# Patient Record
Sex: Female | Born: 1945 | Race: White | Hispanic: No | Smoking: Current every day smoker
Health system: Southern US, Community
[De-identification: ages and names within clinical notes are randomized; demographics above are authoritative.]

## PROBLEM LIST (undated history)

## (undated) DIAGNOSIS — K52831 Collagenous colitis: Secondary | ICD-10-CM

## (undated) DIAGNOSIS — I6529 Occlusion and stenosis of unspecified carotid artery: Secondary | ICD-10-CM

## (undated) DIAGNOSIS — I10 Essential (primary) hypertension: Secondary | ICD-10-CM

## (undated) HISTORY — PX: APPENDECTOMY: SHX54

## (undated) HISTORY — DX: Essential (primary) hypertension: I10

## (undated) HISTORY — DX: Collagenous colitis: K52.831

## (undated) HISTORY — DX: Occlusion and stenosis of unspecified carotid artery: I65.29

---

## 2008-05-12 ENCOUNTER — Emergency Department (HOSPITAL_COMMUNITY): Admission: EM | Admit: 2008-05-12 | Discharge: 2008-05-12 | Payer: Self-pay | Admitting: Emergency Medicine

## 2008-05-12 IMAGING — CR DG RIBS W/ CHEST 3+V*L*
3 series · 3 of 3 positions shown · non-contrast
Comparison: No priors

CLINICAL DATA: Fell - left anterior rib pain

LEFT RIBS AND CHEST - 3+ VIEW

[view not recorded (1 of 3)]
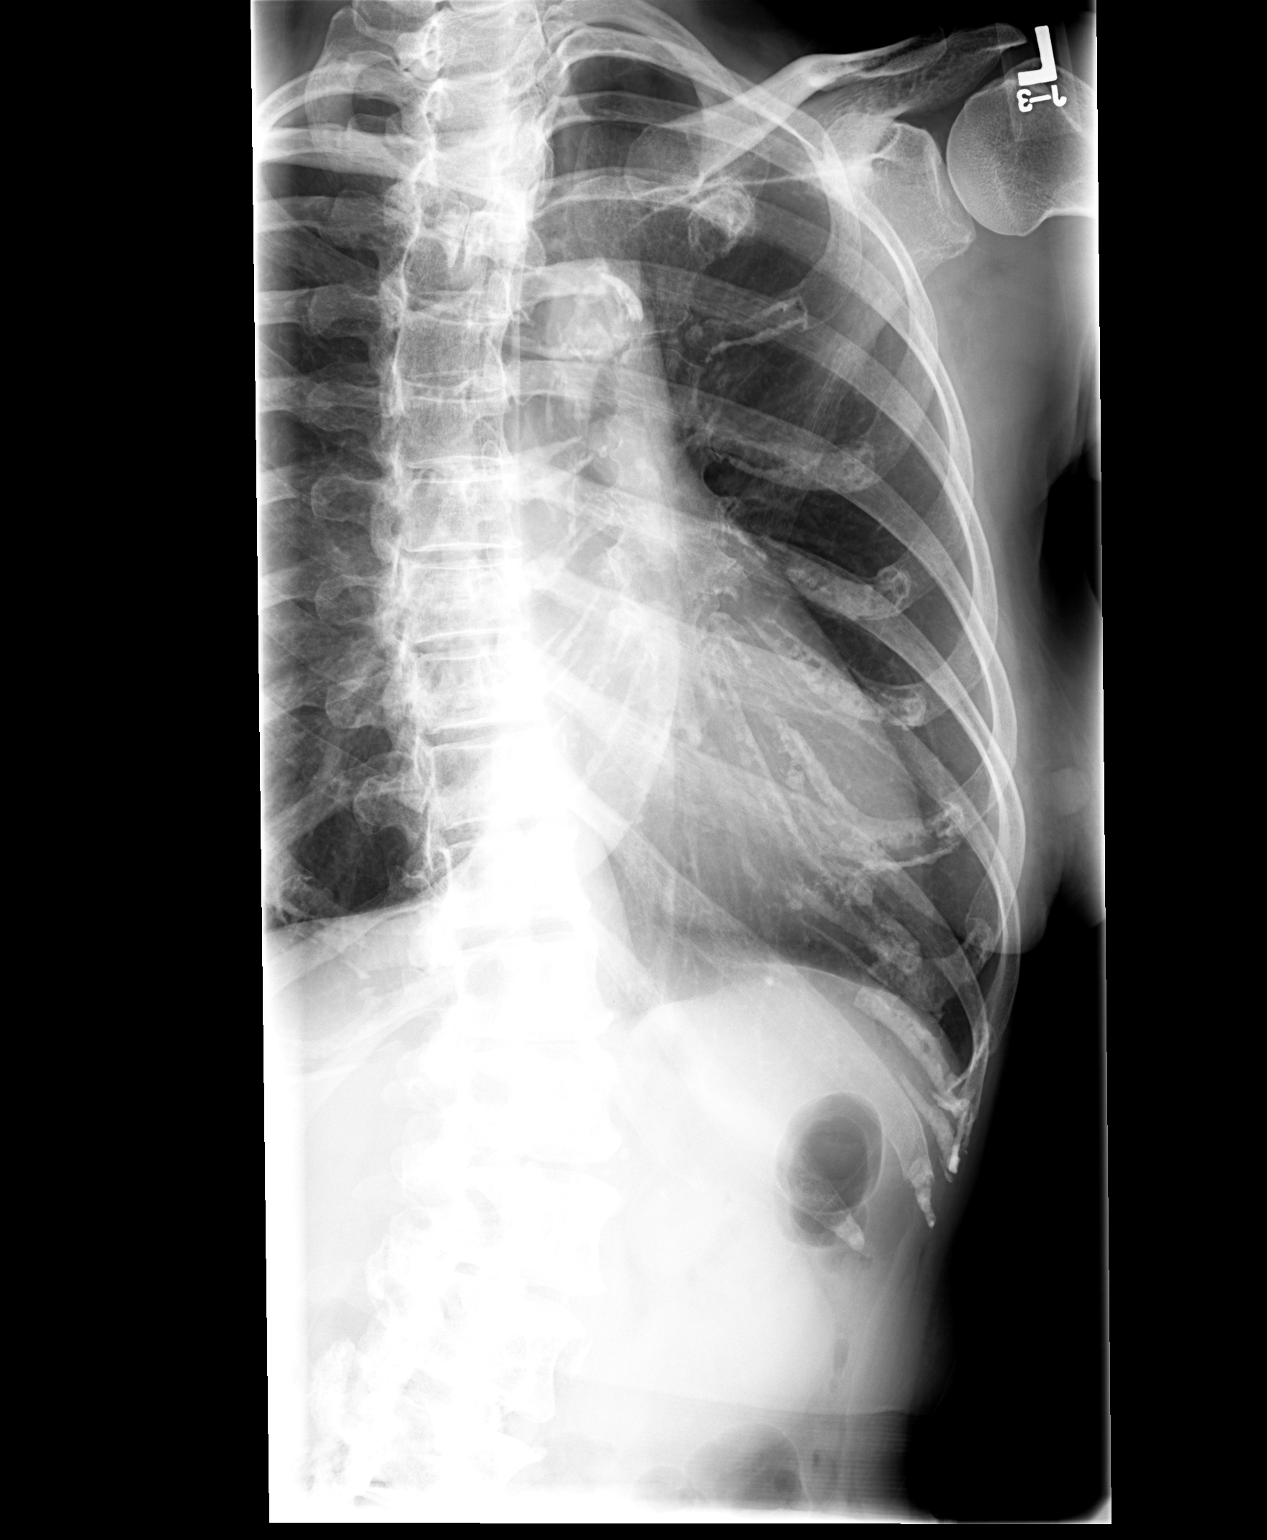

[view not recorded (2 of 3)]
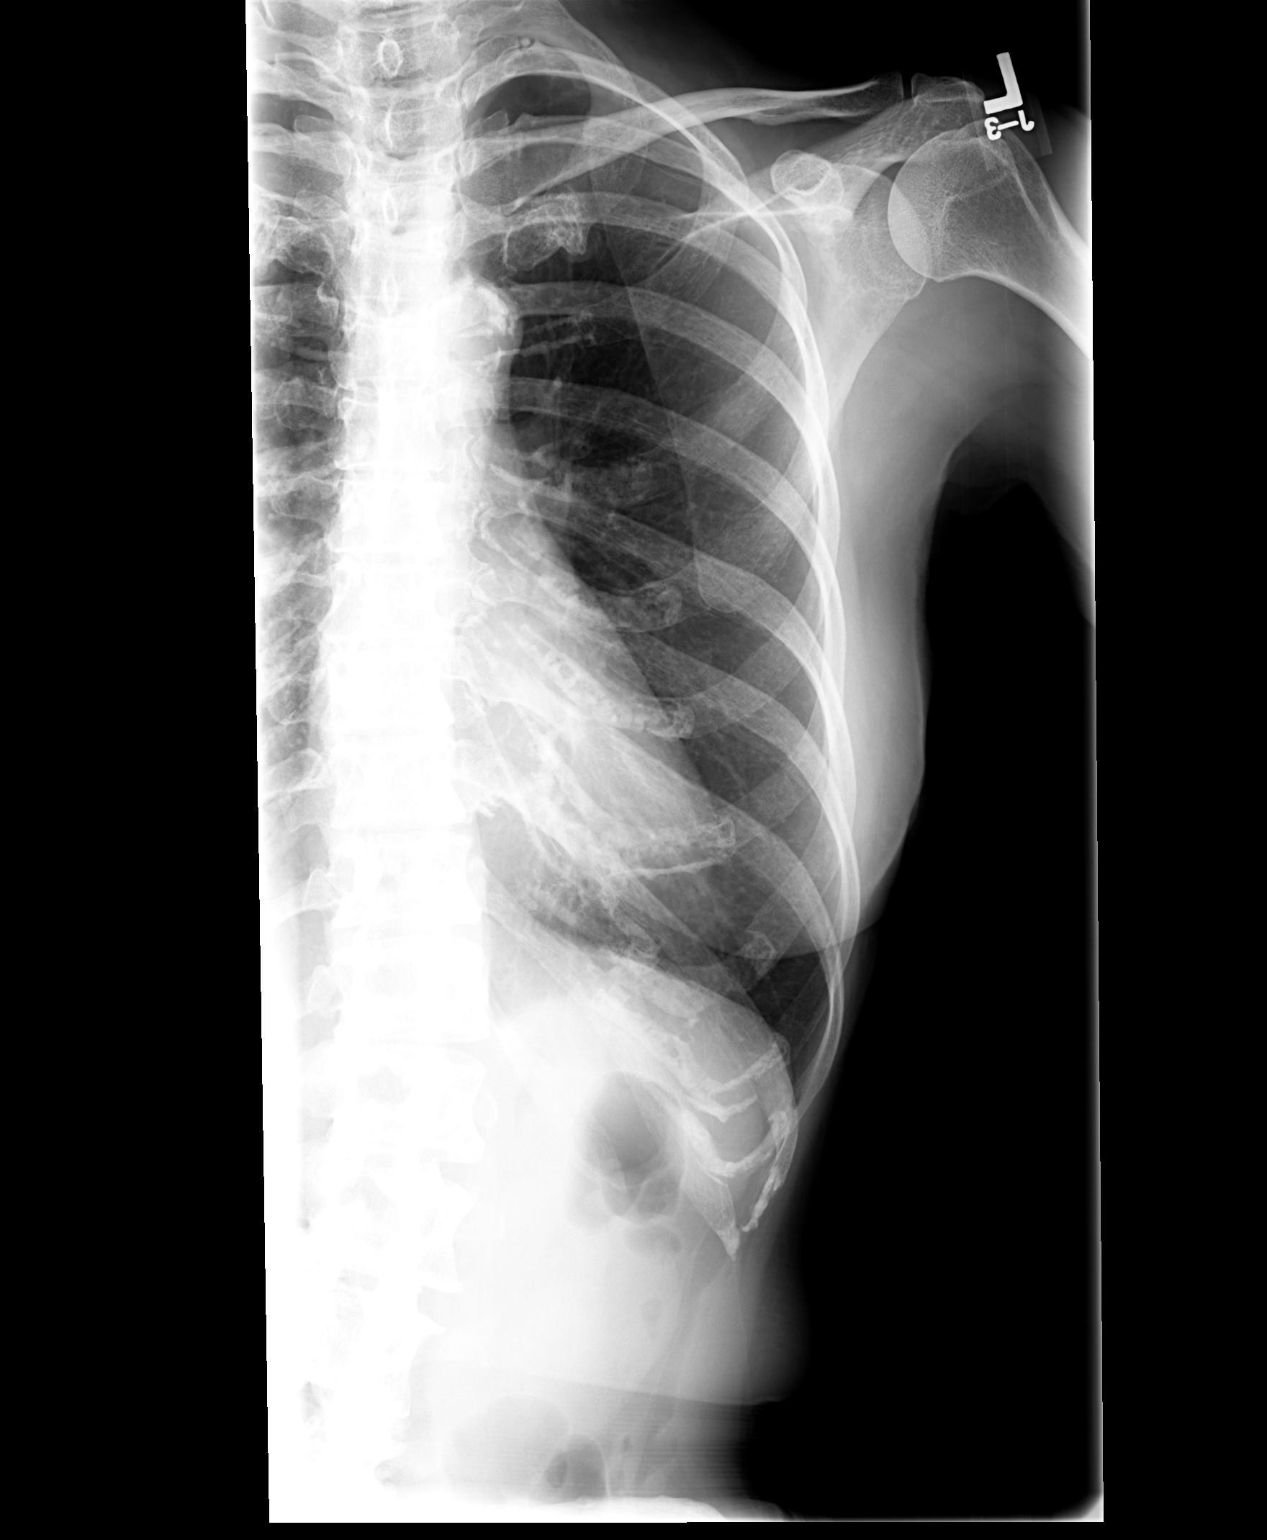

[view not recorded (3 of 3)]
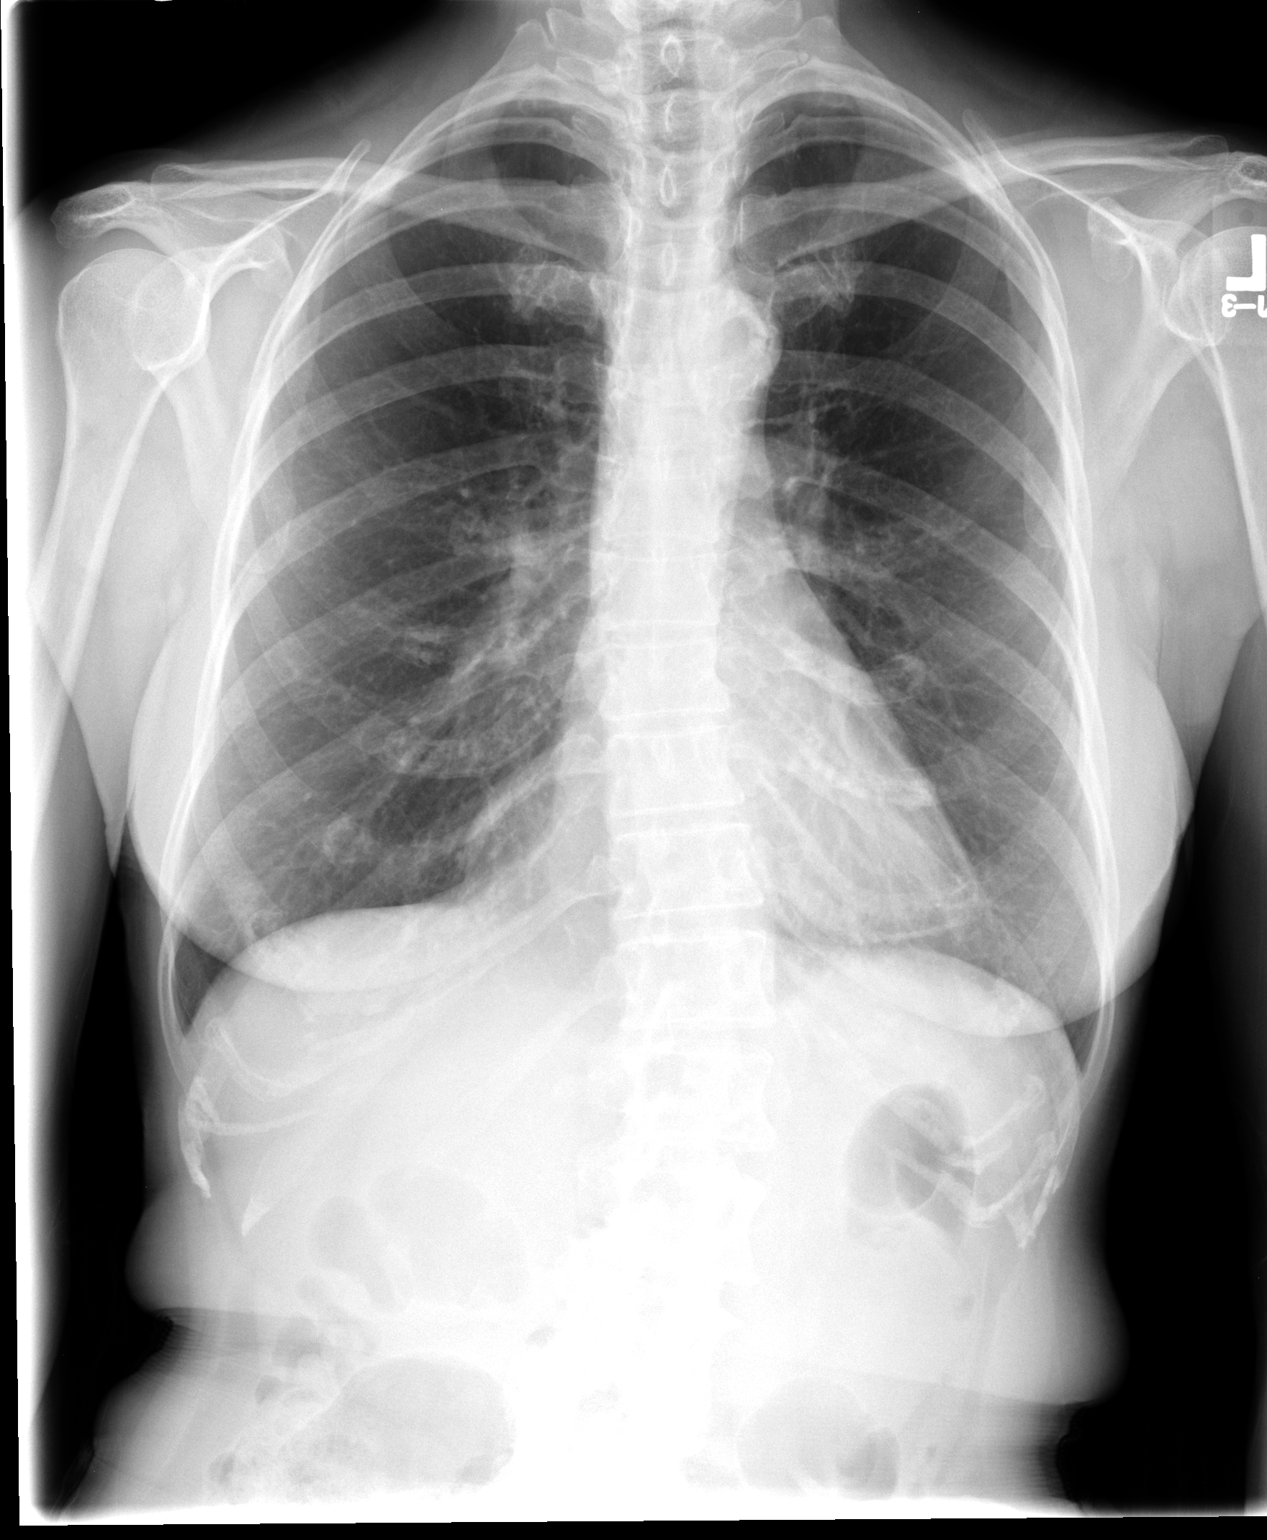

[3 of 3 positions shown; findings below may reference images not displayed]

FINDINGS: Heart and mediastinal contours normal.  Lungs clear.  No
rib fractures, pneumothorax, or hemothorax.  There is a moderate
biconcave thoracolumbar scoliosis.
IMPRESSION: 1.  No rib fractures or other acute changes.
2.  No active cardiopulmonary disease.

## 2008-06-09 ENCOUNTER — Encounter: Admission: RE | Admit: 2008-06-09 | Discharge: 2008-06-09 | Payer: Self-pay | Admitting: Internal Medicine

## 2009-07-01 ENCOUNTER — Encounter: Admission: RE | Admit: 2009-07-01 | Discharge: 2009-07-01 | Payer: Self-pay | Admitting: Internal Medicine

## 2010-07-19 ENCOUNTER — Other Ambulatory Visit: Payer: Self-pay | Admitting: Internal Medicine

## 2010-07-19 DIAGNOSIS — Z1231 Encounter for screening mammogram for malignant neoplasm of breast: Secondary | ICD-10-CM

## 2010-07-26 ENCOUNTER — Ambulatory Visit
Admission: RE | Admit: 2010-07-26 | Discharge: 2010-07-26 | Disposition: A | Discharge status: Home / Self Care | Payer: BC Managed Care – PPO | Source: Ambulatory Visit | Attending: Internal Medicine | Admitting: Internal Medicine

## 2010-07-26 DIAGNOSIS — Z1231 Encounter for screening mammogram for malignant neoplasm of breast: Secondary | ICD-10-CM

## 2011-06-30 DIAGNOSIS — M810 Age-related osteoporosis without current pathological fracture: Secondary | ICD-10-CM | POA: Diagnosis not present

## 2011-06-30 DIAGNOSIS — I10 Essential (primary) hypertension: Secondary | ICD-10-CM | POA: Diagnosis not present

## 2011-06-30 DIAGNOSIS — Z0000 Encounter for general adult medical examination without abnormal findings: Secondary | ICD-10-CM | POA: Diagnosis not present

## 2011-06-30 DIAGNOSIS — Z23 Encounter for immunization: Secondary | ICD-10-CM | POA: Diagnosis not present

## 2011-06-30 DIAGNOSIS — R5381 Other malaise: Secondary | ICD-10-CM | POA: Diagnosis not present

## 2011-06-30 DIAGNOSIS — R5383 Other fatigue: Secondary | ICD-10-CM | POA: Diagnosis not present

## 2011-06-30 DIAGNOSIS — Z79899 Other long term (current) drug therapy: Secondary | ICD-10-CM | POA: Diagnosis not present

## 2011-10-18 ENCOUNTER — Other Ambulatory Visit: Payer: Self-pay | Admitting: Internal Medicine

## 2011-10-18 DIAGNOSIS — Z1231 Encounter for screening mammogram for malignant neoplasm of breast: Secondary | ICD-10-CM

## 2011-11-01 ENCOUNTER — Ambulatory Visit
Admission: RE | Admit: 2011-11-01 | Discharge: 2011-11-01 | Disposition: A | Discharge status: Home / Self Care | Payer: Medicare Other | Source: Ambulatory Visit | Attending: Internal Medicine | Admitting: Internal Medicine

## 2011-11-01 DIAGNOSIS — Z1231 Encounter for screening mammogram for malignant neoplasm of breast: Secondary | ICD-10-CM

## 2012-01-03 DIAGNOSIS — R5383 Other fatigue: Secondary | ICD-10-CM | POA: Diagnosis not present

## 2012-01-03 DIAGNOSIS — R5381 Other malaise: Secondary | ICD-10-CM | POA: Diagnosis not present

## 2012-01-03 DIAGNOSIS — I10 Essential (primary) hypertension: Secondary | ICD-10-CM | POA: Diagnosis not present

## 2012-01-03 DIAGNOSIS — Z79899 Other long term (current) drug therapy: Secondary | ICD-10-CM | POA: Diagnosis not present

## 2012-04-21 DIAGNOSIS — Z23 Encounter for immunization: Secondary | ICD-10-CM | POA: Diagnosis not present

## 2012-07-09 DIAGNOSIS — R7309 Other abnormal glucose: Secondary | ICD-10-CM | POA: Diagnosis not present

## 2012-07-09 DIAGNOSIS — I10 Essential (primary) hypertension: Secondary | ICD-10-CM | POA: Diagnosis not present

## 2012-07-09 DIAGNOSIS — Z23 Encounter for immunization: Secondary | ICD-10-CM | POA: Diagnosis not present

## 2012-07-09 DIAGNOSIS — Z79899 Other long term (current) drug therapy: Secondary | ICD-10-CM | POA: Diagnosis not present

## 2012-07-09 DIAGNOSIS — M542 Cervicalgia: Secondary | ICD-10-CM | POA: Diagnosis not present

## 2012-07-09 DIAGNOSIS — E559 Vitamin D deficiency, unspecified: Secondary | ICD-10-CM | POA: Diagnosis not present

## 2012-07-09 DIAGNOSIS — Z1212 Encounter for screening for malignant neoplasm of rectum: Secondary | ICD-10-CM | POA: Diagnosis not present

## 2012-07-09 DIAGNOSIS — E785 Hyperlipidemia, unspecified: Secondary | ICD-10-CM | POA: Diagnosis not present

## 2012-07-09 DIAGNOSIS — Z0000 Encounter for general adult medical examination without abnormal findings: Secondary | ICD-10-CM | POA: Diagnosis not present

## 2013-01-01 ENCOUNTER — Other Ambulatory Visit: Payer: Self-pay

## 2013-01-01 DIAGNOSIS — Z1231 Encounter for screening mammogram for malignant neoplasm of breast: Secondary | ICD-10-CM

## 2013-01-07 DIAGNOSIS — M79609 Pain in unspecified limb: Secondary | ICD-10-CM | POA: Diagnosis not present

## 2013-01-07 DIAGNOSIS — R5383 Other fatigue: Secondary | ICD-10-CM | POA: Diagnosis not present

## 2013-01-07 DIAGNOSIS — M810 Age-related osteoporosis without current pathological fracture: Secondary | ICD-10-CM | POA: Diagnosis not present

## 2013-01-07 DIAGNOSIS — M25559 Pain in unspecified hip: Secondary | ICD-10-CM | POA: Diagnosis not present

## 2013-01-07 DIAGNOSIS — Z79899 Other long term (current) drug therapy: Secondary | ICD-10-CM | POA: Diagnosis not present

## 2013-01-07 DIAGNOSIS — R5381 Other malaise: Secondary | ICD-10-CM | POA: Diagnosis not present

## 2013-01-07 DIAGNOSIS — I10 Essential (primary) hypertension: Secondary | ICD-10-CM | POA: Diagnosis not present

## 2013-01-10 ENCOUNTER — Ambulatory Visit
Admission: RE | Admit: 2013-01-10 | Discharge: 2013-01-10 | Disposition: A | Discharge status: Home / Self Care | Payer: Medicare Other | Source: Ambulatory Visit | Attending: Internal Medicine | Admitting: Internal Medicine

## 2013-01-10 ENCOUNTER — Other Ambulatory Visit: Payer: Self-pay | Admitting: Internal Medicine

## 2013-01-10 DIAGNOSIS — M25552 Pain in left hip: Secondary | ICD-10-CM

## 2013-01-10 DIAGNOSIS — M169 Osteoarthritis of hip, unspecified: Secondary | ICD-10-CM | POA: Diagnosis not present

## 2013-01-10 DIAGNOSIS — M25551 Pain in right hip: Secondary | ICD-10-CM

## 2013-01-10 IMAGING — CR DG HIP W/ PELVIS BILAT
5 series · 5 of 5 positions shown · non-contrast
Comparison: None.

CLINICAL DATA: Bilateral hip pain, right greater than left, no
trauma

BILATERAL HIP WITH PELVIS - 4+ VIEW

[view not recorded (1 of 5)]
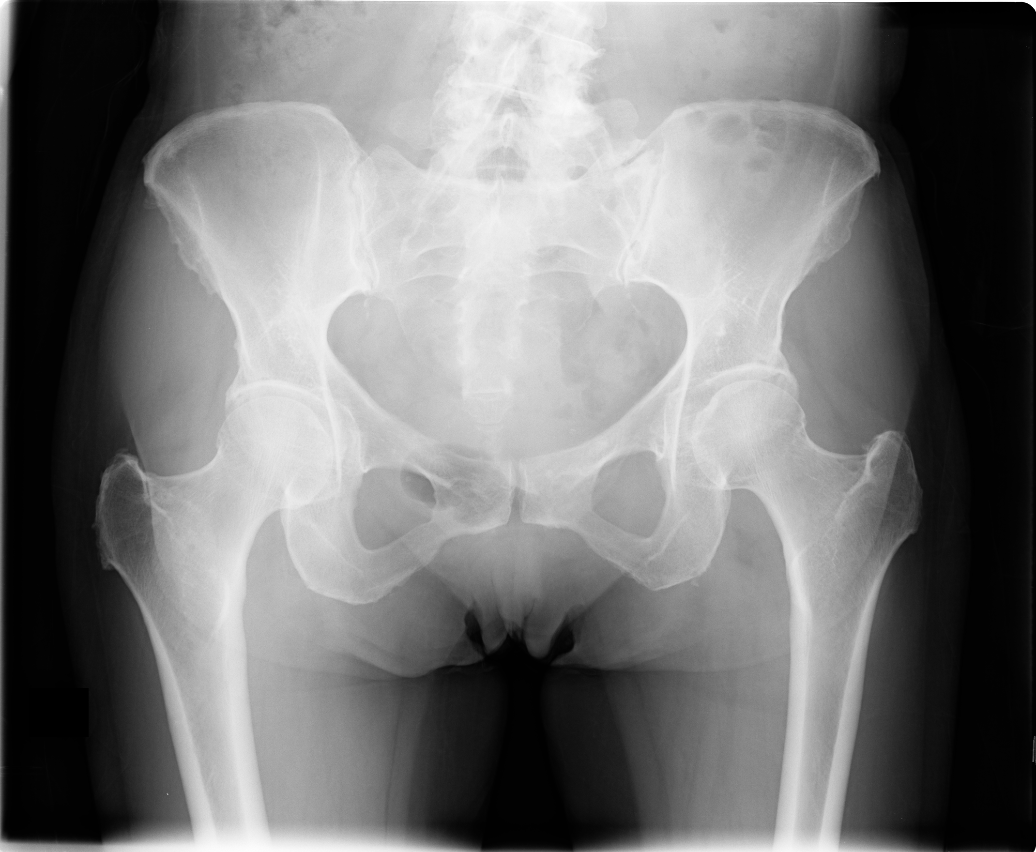

[view not recorded (2 of 5)]
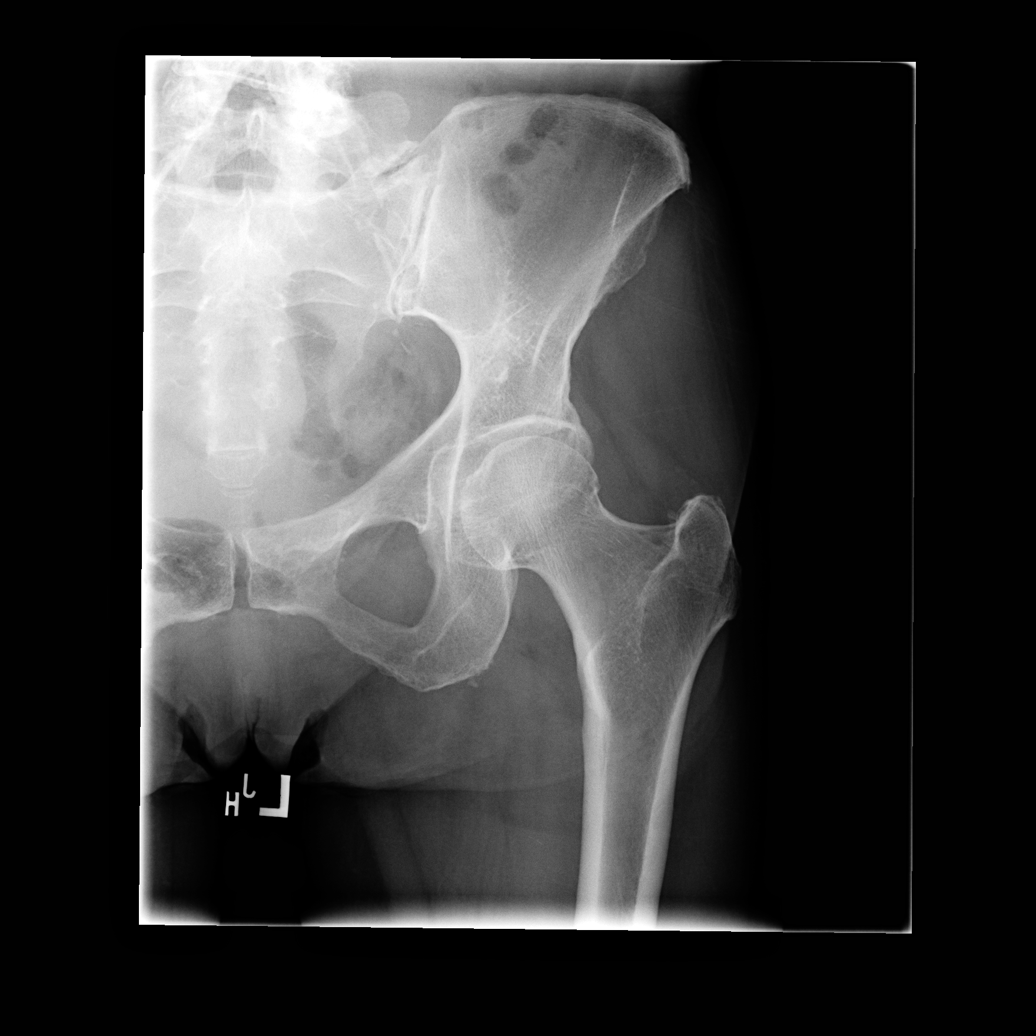

[view not recorded (3 of 5)]
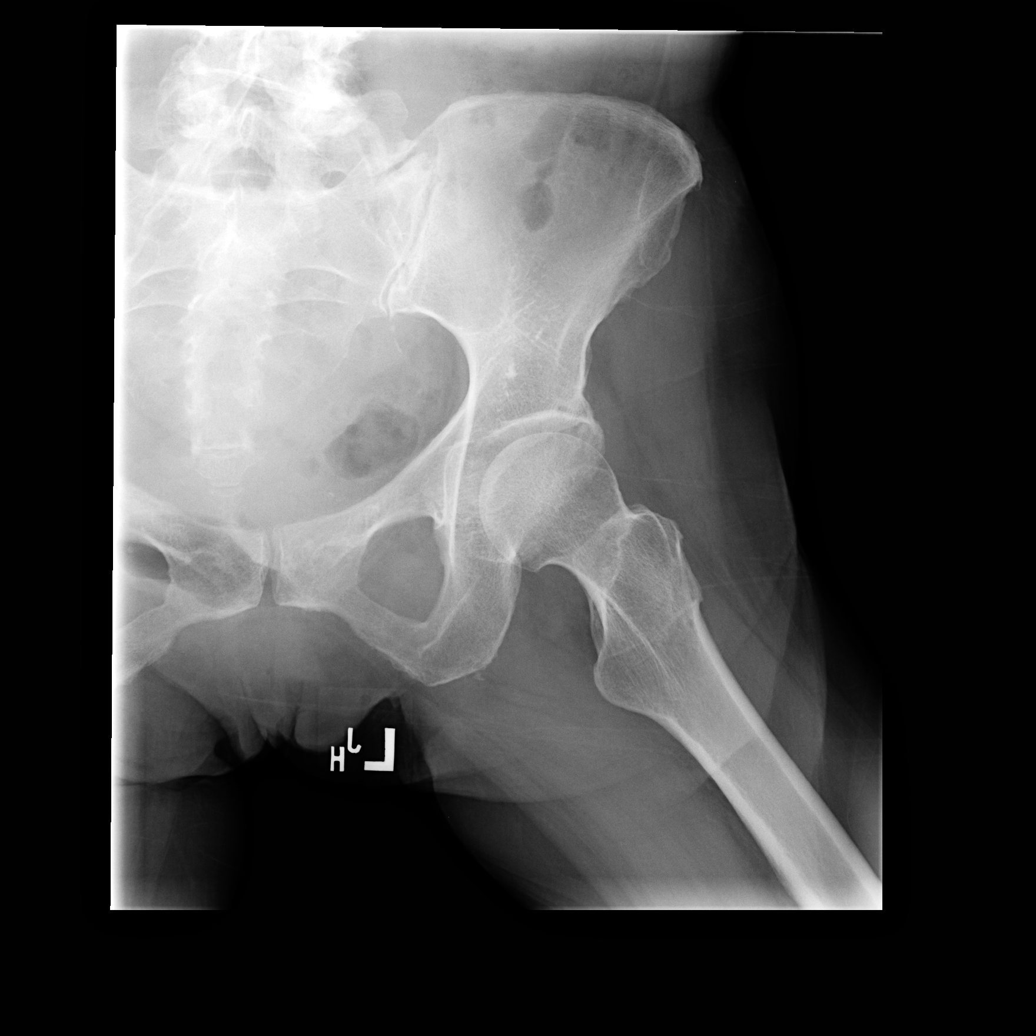

[view not recorded (4 of 5)]
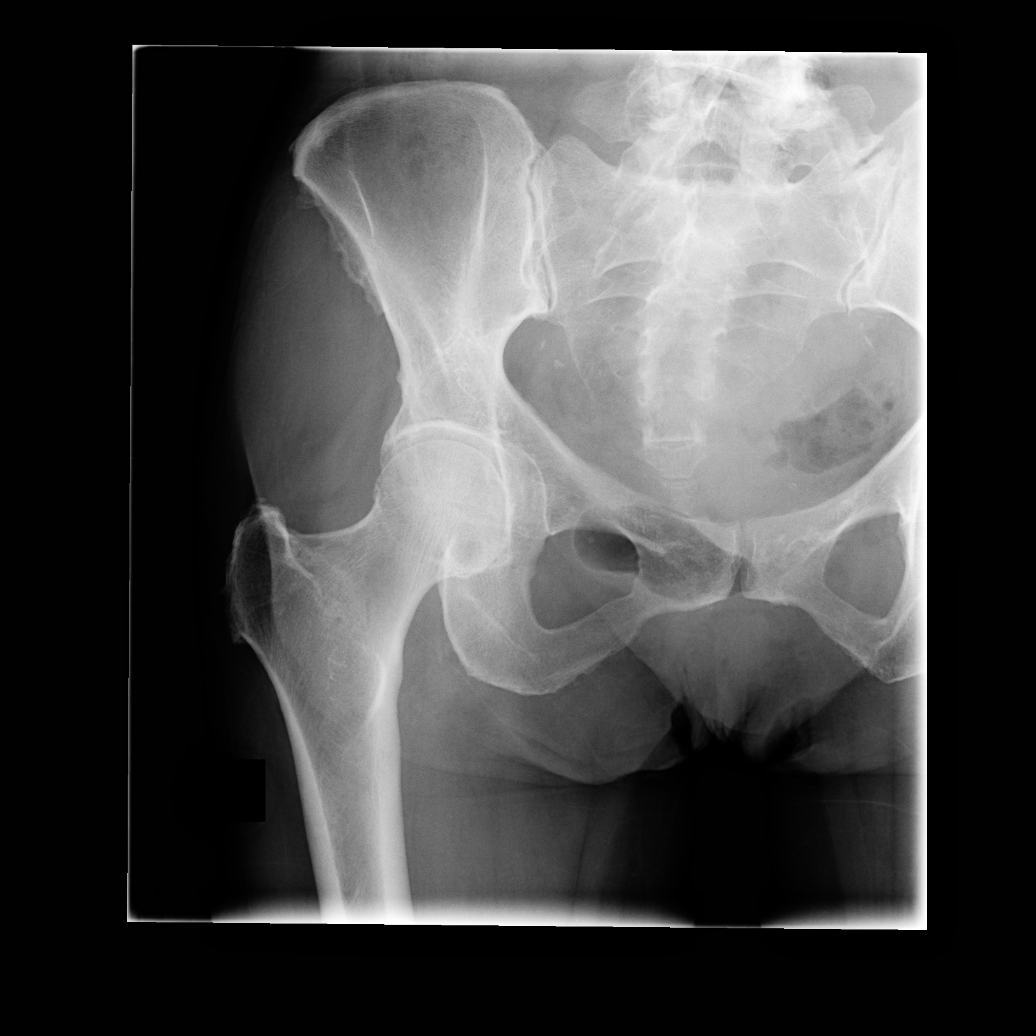

[view not recorded (5 of 5)]
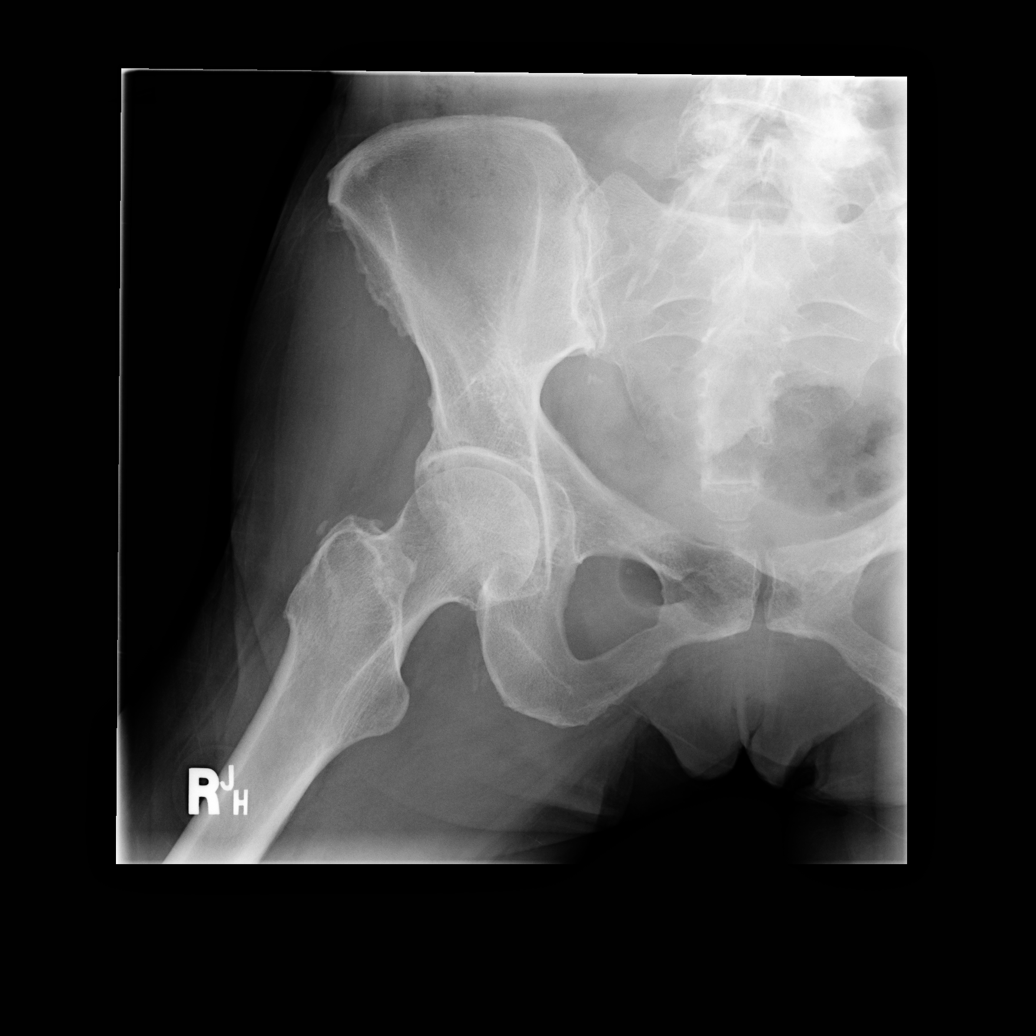

[5 of 5 positions shown; findings below may reference images not displayed]

FINDINGS: There is mild degenerative joint disease of both hips
with slight loss of joint space and spurring.  No acute fracture is
seen.  The pelvic rami are intact.  The SI joints appear corticated
with some degenerative change present.
IMPRESSION: Mild degenerative joint disease of the hips.  No acute abnormality.

## 2013-01-21 ENCOUNTER — Ambulatory Visit
Admission: RE | Admit: 2013-01-21 | Discharge: 2013-01-21 | Disposition: A | Discharge status: Home / Self Care | Payer: No Typology Code available for payment source | Source: Ambulatory Visit

## 2013-01-21 DIAGNOSIS — Z1231 Encounter for screening mammogram for malignant neoplasm of breast: Secondary | ICD-10-CM

## 2013-03-11 DIAGNOSIS — Z23 Encounter for immunization: Secondary | ICD-10-CM | POA: Diagnosis not present

## 2013-05-27 DIAGNOSIS — M2550 Pain in unspecified joint: Secondary | ICD-10-CM | POA: Diagnosis not present

## 2013-05-27 DIAGNOSIS — M533 Sacrococcygeal disorders, not elsewhere classified: Secondary | ICD-10-CM | POA: Diagnosis not present

## 2013-05-27 DIAGNOSIS — R52 Pain, unspecified: Secondary | ICD-10-CM | POA: Diagnosis not present

## 2013-05-27 DIAGNOSIS — R5381 Other malaise: Secondary | ICD-10-CM | POA: Diagnosis not present

## 2013-05-27 DIAGNOSIS — M76899 Other specified enthesopathies of unspecified lower limb, excluding foot: Secondary | ICD-10-CM | POA: Diagnosis not present

## 2013-05-27 DIAGNOSIS — M4120 Other idiopathic scoliosis, site unspecified: Secondary | ICD-10-CM | POA: Diagnosis not present

## 2013-05-27 DIAGNOSIS — E559 Vitamin D deficiency, unspecified: Secondary | ICD-10-CM | POA: Diagnosis not present

## 2013-06-22 DIAGNOSIS — R059 Cough, unspecified: Secondary | ICD-10-CM | POA: Diagnosis not present

## 2013-06-22 DIAGNOSIS — J00 Acute nasopharyngitis [common cold]: Secondary | ICD-10-CM | POA: Diagnosis not present

## 2013-06-22 DIAGNOSIS — R05 Cough: Secondary | ICD-10-CM | POA: Diagnosis not present

## 2013-07-08 DIAGNOSIS — M6281 Muscle weakness (generalized): Secondary | ICD-10-CM | POA: Diagnosis not present

## 2013-07-08 DIAGNOSIS — M62838 Other muscle spasm: Secondary | ICD-10-CM | POA: Diagnosis not present

## 2013-07-08 DIAGNOSIS — M629 Disorder of muscle, unspecified: Secondary | ICD-10-CM | POA: Diagnosis not present

## 2013-07-08 DIAGNOSIS — M533 Sacrococcygeal disorders, not elsewhere classified: Secondary | ICD-10-CM | POA: Diagnosis not present

## 2013-07-16 DIAGNOSIS — M629 Disorder of muscle, unspecified: Secondary | ICD-10-CM | POA: Diagnosis not present

## 2013-07-16 DIAGNOSIS — M533 Sacrococcygeal disorders, not elsewhere classified: Secondary | ICD-10-CM | POA: Diagnosis not present

## 2013-07-16 DIAGNOSIS — M6281 Muscle weakness (generalized): Secondary | ICD-10-CM | POA: Diagnosis not present

## 2013-07-16 DIAGNOSIS — M62838 Other muscle spasm: Secondary | ICD-10-CM | POA: Diagnosis not present

## 2013-07-16 DIAGNOSIS — M2420 Disorder of ligament, unspecified site: Secondary | ICD-10-CM | POA: Diagnosis not present

## 2013-07-22 DIAGNOSIS — I10 Essential (primary) hypertension: Secondary | ICD-10-CM | POA: Diagnosis not present

## 2013-07-22 DIAGNOSIS — Z0000 Encounter for general adult medical examination without abnormal findings: Secondary | ICD-10-CM | POA: Diagnosis not present

## 2013-07-22 DIAGNOSIS — M545 Low back pain, unspecified: Secondary | ICD-10-CM | POA: Diagnosis not present

## 2013-07-22 DIAGNOSIS — R5381 Other malaise: Secondary | ICD-10-CM | POA: Diagnosis not present

## 2013-07-22 DIAGNOSIS — E785 Hyperlipidemia, unspecified: Secondary | ICD-10-CM | POA: Diagnosis not present

## 2013-07-22 DIAGNOSIS — R5383 Other fatigue: Secondary | ICD-10-CM | POA: Diagnosis not present

## 2013-07-22 DIAGNOSIS — R634 Abnormal weight loss: Secondary | ICD-10-CM | POA: Diagnosis not present

## 2013-07-22 DIAGNOSIS — E559 Vitamin D deficiency, unspecified: Secondary | ICD-10-CM | POA: Diagnosis not present

## 2013-07-23 DIAGNOSIS — M629 Disorder of muscle, unspecified: Secondary | ICD-10-CM | POA: Diagnosis not present

## 2013-07-23 DIAGNOSIS — M2420 Disorder of ligament, unspecified site: Secondary | ICD-10-CM | POA: Diagnosis not present

## 2013-07-23 DIAGNOSIS — M62838 Other muscle spasm: Secondary | ICD-10-CM | POA: Diagnosis not present

## 2013-07-23 DIAGNOSIS — M533 Sacrococcygeal disorders, not elsewhere classified: Secondary | ICD-10-CM | POA: Diagnosis not present

## 2013-07-23 DIAGNOSIS — M6281 Muscle weakness (generalized): Secondary | ICD-10-CM | POA: Diagnosis not present

## 2013-08-01 DIAGNOSIS — R5381 Other malaise: Secondary | ICD-10-CM | POA: Diagnosis not present

## 2013-08-01 DIAGNOSIS — M4120 Other idiopathic scoliosis, site unspecified: Secondary | ICD-10-CM | POA: Diagnosis not present

## 2013-08-01 DIAGNOSIS — M76899 Other specified enthesopathies of unspecified lower limb, excluding foot: Secondary | ICD-10-CM | POA: Diagnosis not present

## 2013-08-01 DIAGNOSIS — R5383 Other fatigue: Secondary | ICD-10-CM | POA: Diagnosis not present

## 2013-08-01 DIAGNOSIS — M533 Sacrococcygeal disorders, not elsewhere classified: Secondary | ICD-10-CM | POA: Diagnosis not present

## 2013-08-06 DIAGNOSIS — M629 Disorder of muscle, unspecified: Secondary | ICD-10-CM | POA: Diagnosis not present

## 2013-08-06 DIAGNOSIS — R279 Unspecified lack of coordination: Secondary | ICD-10-CM | POA: Diagnosis not present

## 2013-08-06 DIAGNOSIS — M62838 Other muscle spasm: Secondary | ICD-10-CM | POA: Diagnosis not present

## 2013-08-06 DIAGNOSIS — M25559 Pain in unspecified hip: Secondary | ICD-10-CM | POA: Diagnosis not present

## 2013-08-27 DIAGNOSIS — M62838 Other muscle spasm: Secondary | ICD-10-CM | POA: Diagnosis not present

## 2013-08-27 DIAGNOSIS — M629 Disorder of muscle, unspecified: Secondary | ICD-10-CM | POA: Diagnosis not present

## 2013-08-27 DIAGNOSIS — M6281 Muscle weakness (generalized): Secondary | ICD-10-CM | POA: Diagnosis not present

## 2013-08-27 DIAGNOSIS — M25559 Pain in unspecified hip: Secondary | ICD-10-CM | POA: Diagnosis not present

## 2013-09-17 DIAGNOSIS — M25559 Pain in unspecified hip: Secondary | ICD-10-CM | POA: Diagnosis not present

## 2013-09-17 DIAGNOSIS — M2420 Disorder of ligament, unspecified site: Secondary | ICD-10-CM | POA: Diagnosis not present

## 2013-09-17 DIAGNOSIS — R279 Unspecified lack of coordination: Secondary | ICD-10-CM | POA: Diagnosis not present

## 2013-09-17 DIAGNOSIS — M629 Disorder of muscle, unspecified: Secondary | ICD-10-CM | POA: Diagnosis not present

## 2013-09-17 DIAGNOSIS — M62838 Other muscle spasm: Secondary | ICD-10-CM | POA: Diagnosis not present

## 2013-09-24 DIAGNOSIS — I10 Essential (primary) hypertension: Secondary | ICD-10-CM | POA: Diagnosis not present

## 2013-09-30 ENCOUNTER — Other Ambulatory Visit: Payer: Self-pay | Admitting: Family Medicine

## 2013-09-30 ENCOUNTER — Ambulatory Visit
Admission: RE | Admit: 2013-09-30 | Discharge: 2013-09-30 | Disposition: A | Discharge status: Home / Self Care | Payer: Medicare Other | Source: Ambulatory Visit | Attending: Family Medicine | Admitting: Family Medicine

## 2013-09-30 DIAGNOSIS — R5381 Other malaise: Secondary | ICD-10-CM | POA: Diagnosis not present

## 2013-09-30 DIAGNOSIS — R0602 Shortness of breath: Secondary | ICD-10-CM

## 2013-09-30 DIAGNOSIS — R5383 Other fatigue: Secondary | ICD-10-CM | POA: Diagnosis not present

## 2013-09-30 DIAGNOSIS — F17200 Nicotine dependence, unspecified, uncomplicated: Secondary | ICD-10-CM

## 2013-09-30 IMAGING — CR DG CHEST 2V
2 series · 2 of 2 positions shown · non-contrast
Comparison: DG RIBS UNILATERAL W/CHEST*L* dated [DATE]

CLINICAL DATA: SHORTNESS OF BREATH AND FATIGUE IN A LONG TERM
SMOKER

EXAM:
CHEST  2 VIEW

[view not recorded (1 of 2)]
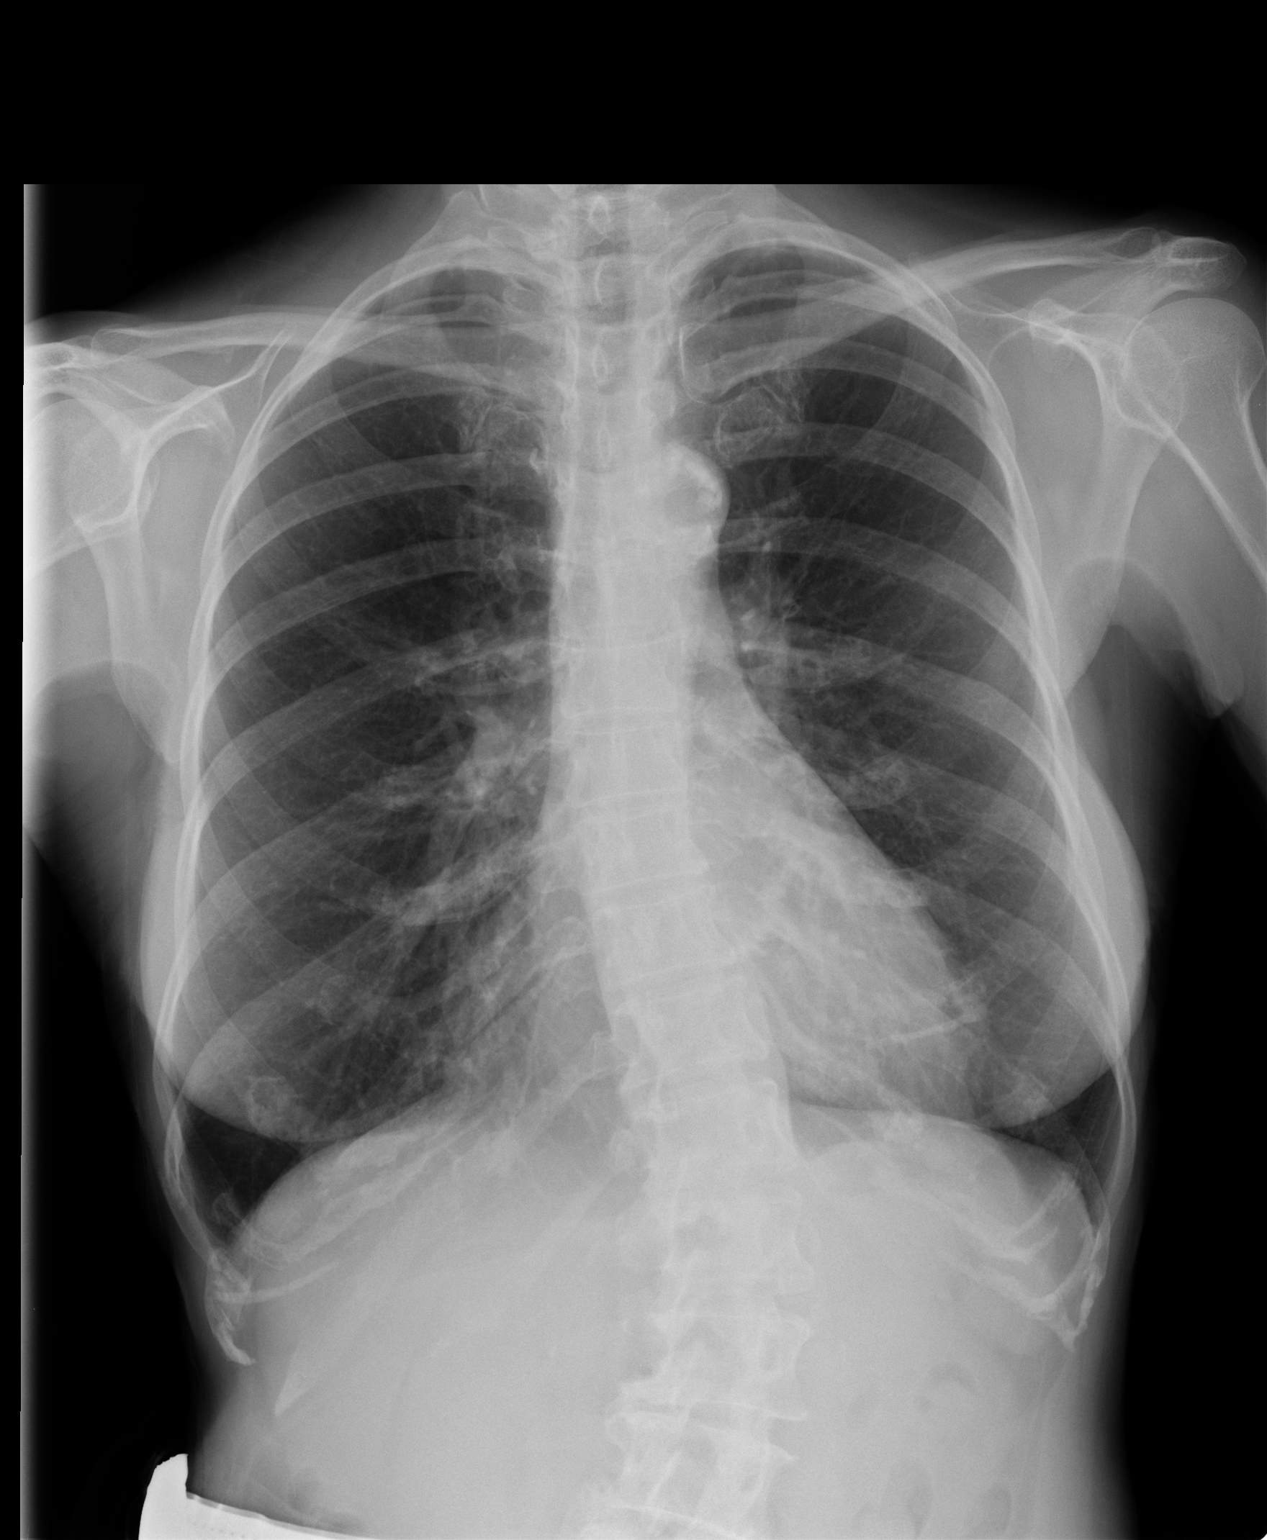

[view not recorded (2 of 2)]
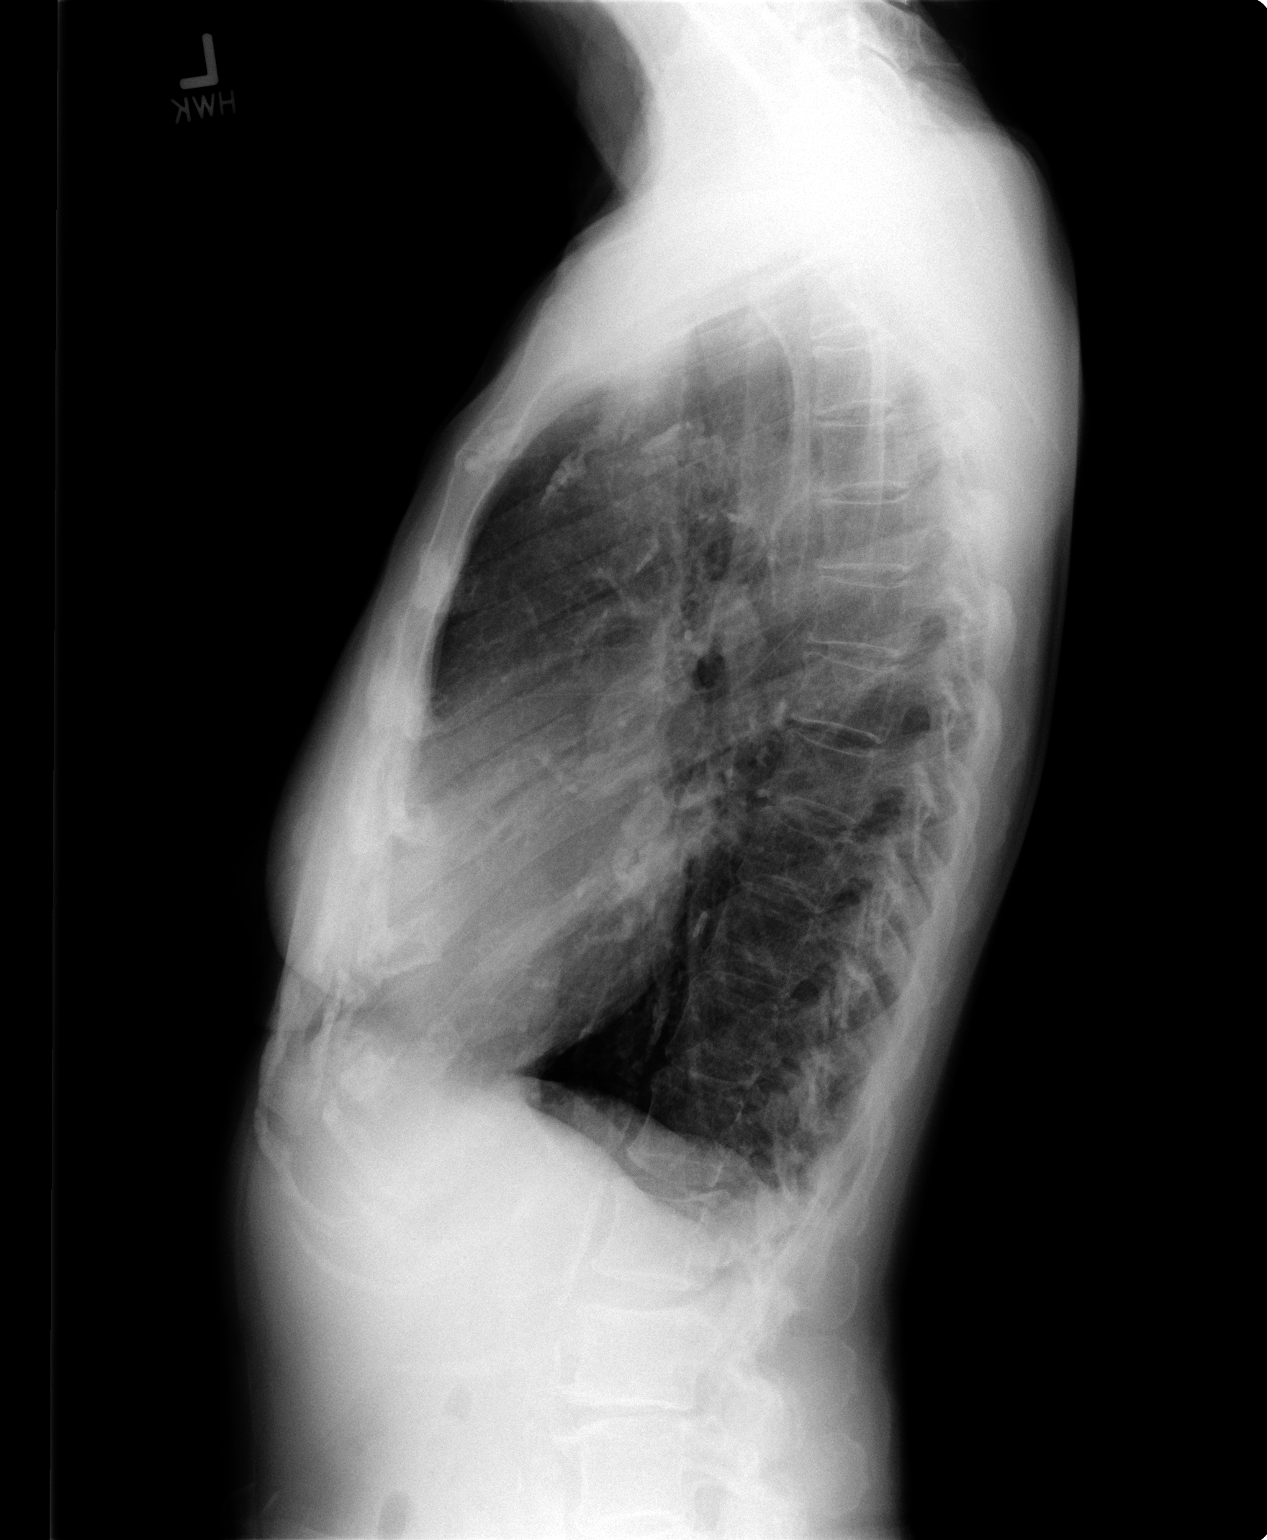

[2 of 2 positions shown; findings below may reference images not displayed]

FINDINGS: The heart size and mediastinal contours are within normal limits.
Both lungs are clear. S-shaped scoliosis is appreciated within the
thoracolumbar spine.
IMPRESSION: No active cardiopulmonary disease.

## 2013-10-07 DIAGNOSIS — M25559 Pain in unspecified hip: Secondary | ICD-10-CM | POA: Diagnosis not present

## 2013-10-07 DIAGNOSIS — M62838 Other muscle spasm: Secondary | ICD-10-CM | POA: Diagnosis not present

## 2013-10-07 DIAGNOSIS — M629 Disorder of muscle, unspecified: Secondary | ICD-10-CM | POA: Diagnosis not present

## 2013-10-07 DIAGNOSIS — M533 Sacrococcygeal disorders, not elsewhere classified: Secondary | ICD-10-CM | POA: Diagnosis not present

## 2013-10-15 DIAGNOSIS — R5383 Other fatigue: Secondary | ICD-10-CM | POA: Diagnosis not present

## 2013-10-15 DIAGNOSIS — R634 Abnormal weight loss: Secondary | ICD-10-CM | POA: Diagnosis not present

## 2013-10-15 DIAGNOSIS — I10 Essential (primary) hypertension: Secondary | ICD-10-CM | POA: Diagnosis not present

## 2013-10-15 DIAGNOSIS — R5381 Other malaise: Secondary | ICD-10-CM | POA: Diagnosis not present

## 2013-10-21 DIAGNOSIS — M25559 Pain in unspecified hip: Secondary | ICD-10-CM | POA: Diagnosis not present

## 2013-10-21 DIAGNOSIS — M2420 Disorder of ligament, unspecified site: Secondary | ICD-10-CM | POA: Diagnosis not present

## 2013-10-21 DIAGNOSIS — R279 Unspecified lack of coordination: Secondary | ICD-10-CM | POA: Diagnosis not present

## 2013-10-21 DIAGNOSIS — M629 Disorder of muscle, unspecified: Secondary | ICD-10-CM | POA: Diagnosis not present

## 2013-10-21 DIAGNOSIS — M62838 Other muscle spasm: Secondary | ICD-10-CM | POA: Diagnosis not present

## 2013-11-12 DIAGNOSIS — R5383 Other fatigue: Secondary | ICD-10-CM | POA: Diagnosis not present

## 2013-11-12 DIAGNOSIS — M76899 Other specified enthesopathies of unspecified lower limb, excluding foot: Secondary | ICD-10-CM | POA: Diagnosis not present

## 2013-11-12 DIAGNOSIS — R5381 Other malaise: Secondary | ICD-10-CM | POA: Diagnosis not present

## 2013-11-12 DIAGNOSIS — I10 Essential (primary) hypertension: Secondary | ICD-10-CM | POA: Diagnosis not present

## 2013-11-18 DIAGNOSIS — I10 Essential (primary) hypertension: Secondary | ICD-10-CM | POA: Diagnosis not present

## 2013-12-17 DIAGNOSIS — F17200 Nicotine dependence, unspecified, uncomplicated: Secondary | ICD-10-CM | POA: Diagnosis not present

## 2013-12-17 DIAGNOSIS — R197 Diarrhea, unspecified: Secondary | ICD-10-CM | POA: Diagnosis not present

## 2013-12-17 DIAGNOSIS — I10 Essential (primary) hypertension: Secondary | ICD-10-CM | POA: Diagnosis not present

## 2013-12-17 DIAGNOSIS — R634 Abnormal weight loss: Secondary | ICD-10-CM | POA: Diagnosis not present

## 2014-02-04 DIAGNOSIS — M659 Synovitis and tenosynovitis, unspecified: Secondary | ICD-10-CM | POA: Diagnosis not present

## 2014-02-04 DIAGNOSIS — I10 Essential (primary) hypertension: Secondary | ICD-10-CM | POA: Diagnosis not present

## 2014-02-04 DIAGNOSIS — Z23 Encounter for immunization: Secondary | ICD-10-CM | POA: Diagnosis not present

## 2014-02-04 DIAGNOSIS — M76899 Other specified enthesopathies of unspecified lower limb, excluding foot: Secondary | ICD-10-CM | POA: Diagnosis not present

## 2014-02-04 DIAGNOSIS — Z0000 Encounter for general adult medical examination without abnormal findings: Secondary | ICD-10-CM | POA: Diagnosis not present

## 2014-02-04 DIAGNOSIS — R634 Abnormal weight loss: Secondary | ICD-10-CM | POA: Diagnosis not present

## 2014-02-04 DIAGNOSIS — M7150 Other bursitis, not elsewhere classified, unspecified site: Secondary | ICD-10-CM | POA: Diagnosis not present

## 2014-02-04 DIAGNOSIS — Z1211 Encounter for screening for malignant neoplasm of colon: Secondary | ICD-10-CM | POA: Diagnosis not present

## 2014-02-04 DIAGNOSIS — F17200 Nicotine dependence, unspecified, uncomplicated: Secondary | ICD-10-CM | POA: Diagnosis not present

## 2014-02-04 DIAGNOSIS — Z136 Encounter for screening for cardiovascular disorders: Secondary | ICD-10-CM | POA: Diagnosis not present

## 2014-03-03 DIAGNOSIS — F17200 Nicotine dependence, unspecified, uncomplicated: Secondary | ICD-10-CM | POA: Diagnosis not present

## 2014-03-03 DIAGNOSIS — R197 Diarrhea, unspecified: Secondary | ICD-10-CM | POA: Diagnosis not present

## 2014-03-03 DIAGNOSIS — I10 Essential (primary) hypertension: Secondary | ICD-10-CM | POA: Diagnosis not present

## 2014-03-03 DIAGNOSIS — Z23 Encounter for immunization: Secondary | ICD-10-CM | POA: Diagnosis not present

## 2014-03-03 DIAGNOSIS — R0989 Other specified symptoms and signs involving the circulatory and respiratory systems: Secondary | ICD-10-CM | POA: Diagnosis not present

## 2014-03-04 ENCOUNTER — Other Ambulatory Visit: Payer: Self-pay | Admitting: Family Medicine

## 2014-03-04 DIAGNOSIS — R0989 Other specified symptoms and signs involving the circulatory and respiratory systems: Secondary | ICD-10-CM

## 2014-03-07 ENCOUNTER — Other Ambulatory Visit: Payer: Self-pay

## 2014-03-07 DIAGNOSIS — Z1231 Encounter for screening mammogram for malignant neoplasm of breast: Secondary | ICD-10-CM

## 2014-03-10 ENCOUNTER — Ambulatory Visit
Admission: RE | Admit: 2014-03-10 | Discharge: 2014-03-10 | Disposition: A | Discharge status: Home / Self Care | Payer: Medicare Other | Source: Ambulatory Visit | Attending: Family Medicine | Admitting: Family Medicine

## 2014-03-10 DIAGNOSIS — R0989 Other specified symptoms and signs involving the circulatory and respiratory systems: Secondary | ICD-10-CM

## 2014-03-10 DIAGNOSIS — I658 Occlusion and stenosis of other precerebral arteries: Secondary | ICD-10-CM | POA: Diagnosis not present

## 2014-03-10 DIAGNOSIS — I6529 Occlusion and stenosis of unspecified carotid artery: Secondary | ICD-10-CM | POA: Diagnosis not present

## 2014-03-10 IMAGING — US US CAROTID DUPLEX BILAT
1 series · 13 of 24 positions shown · non-contrast
Comparison: None.

CLINICAL DATA: Bilateral bruits demonstrated on physical
examination. History of hypertension and smoking.

EXAM:
BILATERAL CAROTID DUPLEX ULTRASOUND
TECHNIQUE: Gray scale imaging, color Doppler and duplex ultrasound were
performed of bilateral carotid and vertebral arteries in the neck.

[Series 1: us carotid duplex bilat · 0.06mm/px · 13 of 59 slices shown]
[im 1/59]
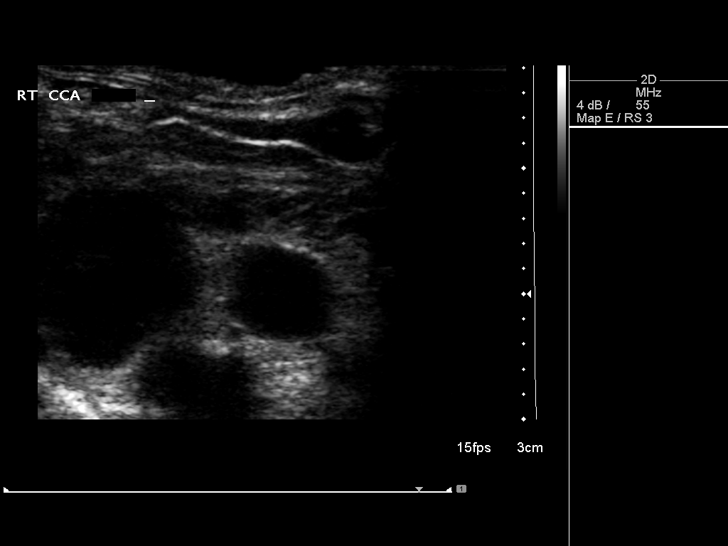
[im 6/59]
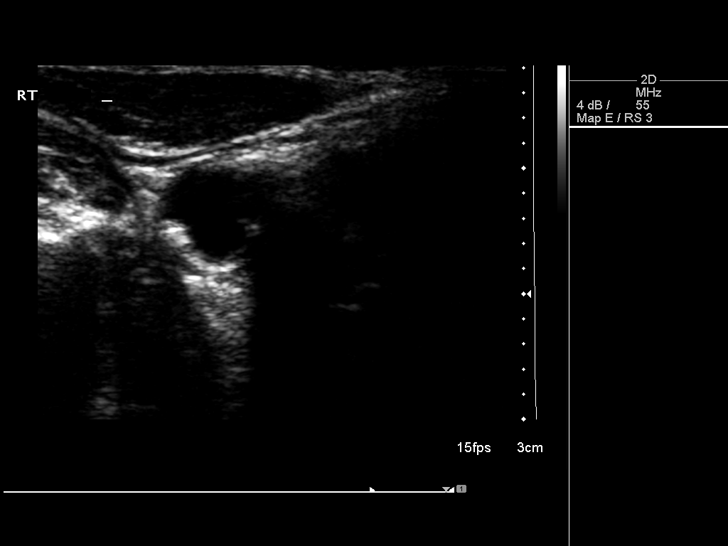
[im 11/59]
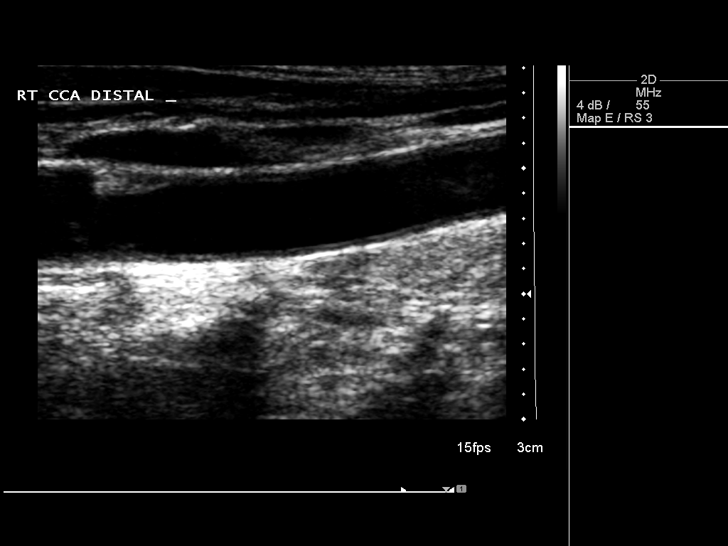
[im 16/59]
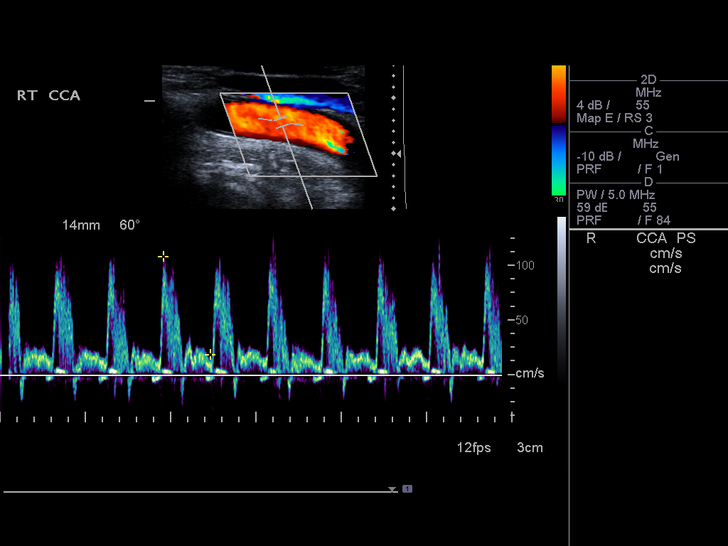
[im 21/59]
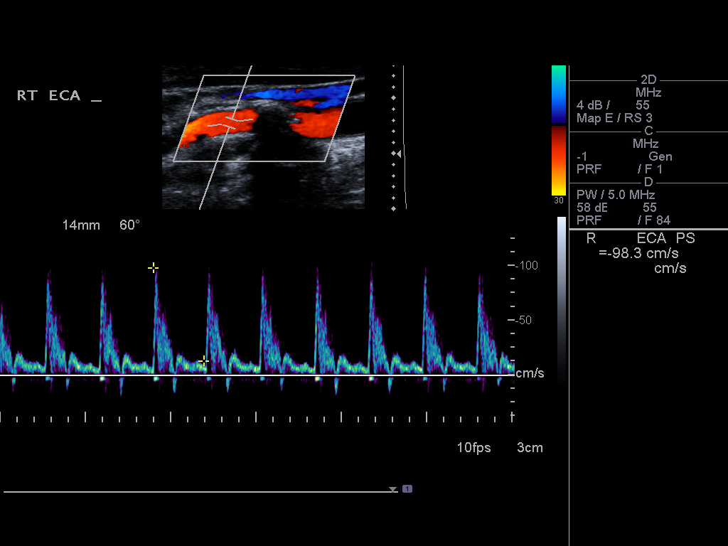
[im 26/59]
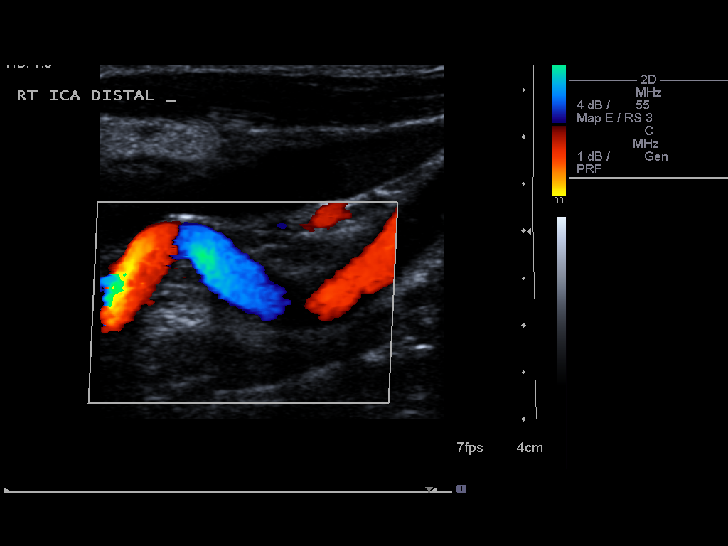
[im 31/59]
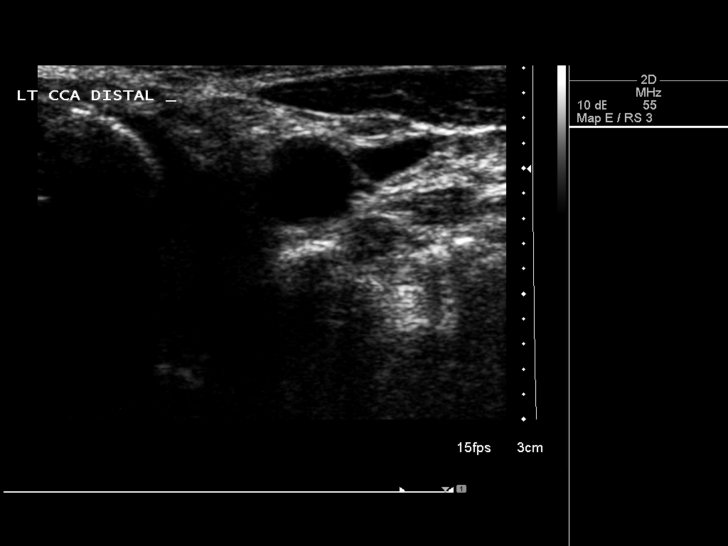
[im 33/59]
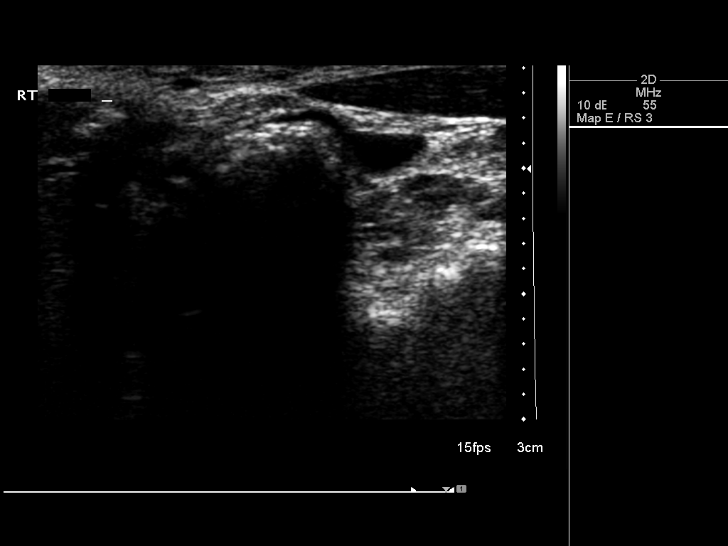
[im 38/59]
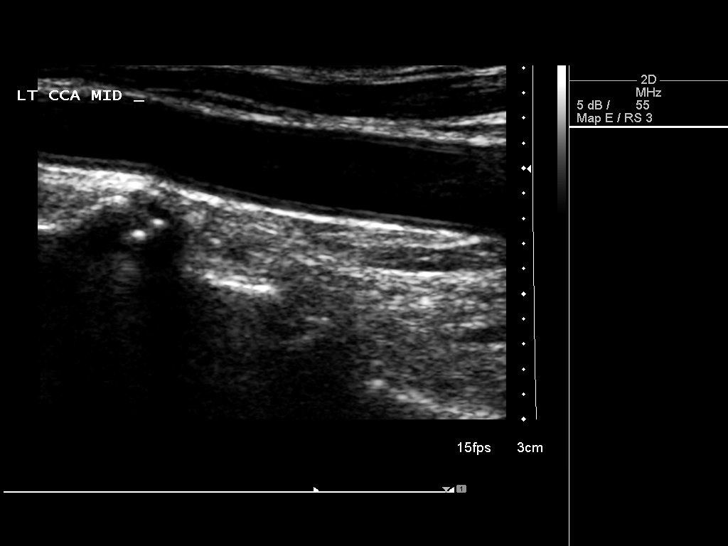
[im 43/59]
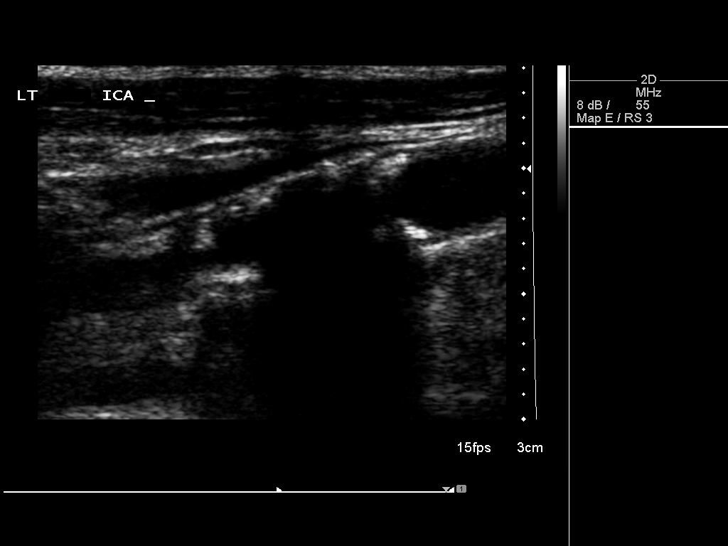
[im 48/59]
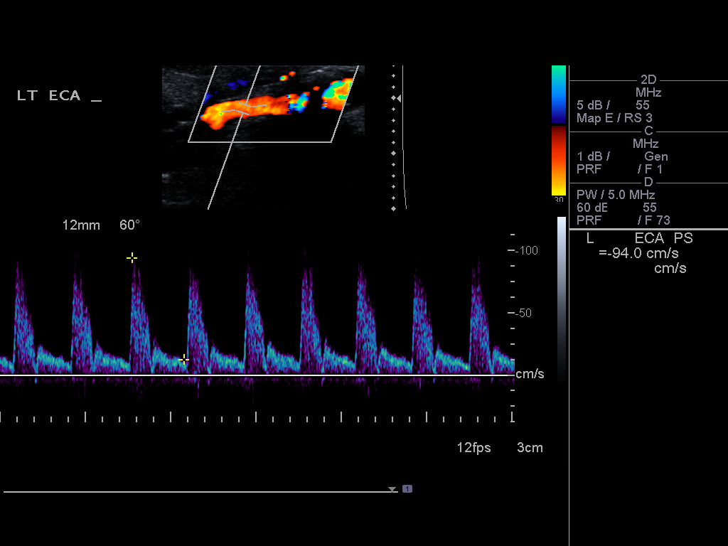
[im 53/59]
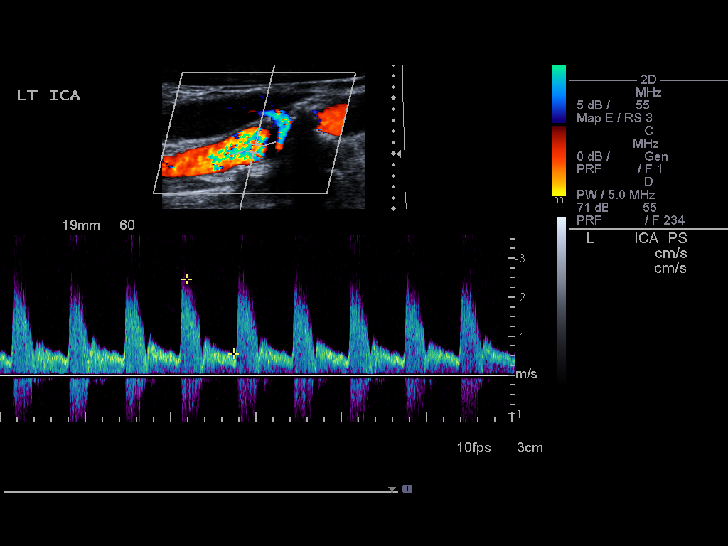
[im 59/59]
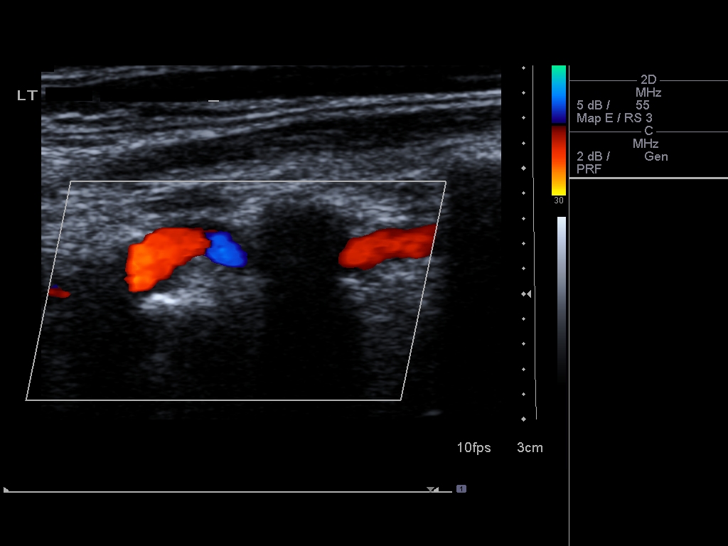

[13 of 24 positions shown; findings below may reference images not displayed]

FINDINGS: Criteria: Quantification of carotid stenosis is based on velocity
parameters that correlate the residual internal carotid diameter
with NASCET-based stenosis levels, using the diameter of the distal
internal carotid lumen as the denominator for stenosis measurement.

The following velocity measurements were obtained:

RIGHT

ICA:  99/20 cm/sec

CCA:  109/19 cm/sec

SYSTOLIC ICA/CCA RATIO:

DIASTOLIC ICA/CCA RATIO:

ECA:  98 cm/sec

LEFT

ICA:  253/55 cm/sec

CCA:  102/22 cm/sec

SYSTOLIC ICA/CCA RATIO:

DIASTOLIC ICA/CCA RATIO:

ECA:  94 cm/sec

RIGHT CAROTID ARTERY: There is moderate amount of eccentric mixed
echogenic partially shadowing plaque within the right carotid bulb
(images 5, 6 and 7), extending to involve the origin and proximal
aspects of the right internal carotid artery (images 14, 15 and 16),
not resulting in elevated peak systolic velocities in the
interrogated course of the right internal carotid artery. The distal
aspect of the right internal carotid artery is noted to be tortuous.

RIGHT VERTEBRAL ARTERY:  Antegrade flow

LEFT CAROTID ARTERY: There is a moderate to large amount of
eccentric echogenic shadowing plaque within the left carotid bulb
(images 34 and 35) extending to involve the origin and proximal
aspects of the left internal carotid artery (images 42, 43 and 44),
resulting in elevated peak systolic velocities within the proximal
aspect of the left internal carotid artery (measuring 254 cm/sec -
image 57).

LEFT VERTEBRAL ARTERY:  Antegrade flow
IMPRESSION: 1. Moderate to large amount of left-sided partially shadowing
atherosclerotic plaque results in elevated peak systolic velocities
with the proximal aspect of the left internal carotid artery
compatible with the greater than 70% luminal narrowing range.
Further evaluation with CTA could be performed as clinically
indicated.
2. Moderate amount of right-sided atherosclerotic plaque, not
definitely resulting in a hemodynamically significant stenosis.

## 2014-03-18 ENCOUNTER — Ambulatory Visit
Admission: RE | Admit: 2014-03-18 | Discharge: 2014-03-18 | Disposition: A | Discharge status: Home / Self Care | Payer: Medicare Other | Source: Ambulatory Visit

## 2014-03-18 DIAGNOSIS — Z1231 Encounter for screening mammogram for malignant neoplasm of breast: Secondary | ICD-10-CM | POA: Diagnosis not present

## 2014-03-19 ENCOUNTER — Encounter: Payer: Self-pay | Admitting: Vascular Surgery

## 2014-03-19 ENCOUNTER — Other Ambulatory Visit: Payer: Self-pay

## 2014-03-19 DIAGNOSIS — I6522 Occlusion and stenosis of left carotid artery: Secondary | ICD-10-CM

## 2014-03-26 ENCOUNTER — Encounter: Payer: Self-pay | Admitting: Vascular Surgery

## 2014-03-27 ENCOUNTER — Ambulatory Visit (INDEPENDENT_AMBULATORY_CARE_PROVIDER_SITE_OTHER): Payer: Medicare Other | Admitting: Vascular Surgery

## 2014-03-27 ENCOUNTER — Ambulatory Visit (HOSPITAL_COMMUNITY)
Admission: RE | Admit: 2014-03-27 | Discharge: 2014-03-27 | Disposition: A | Discharge status: Home / Self Care | Payer: Medicare Other | Source: Ambulatory Visit | Attending: Vascular Surgery | Admitting: Vascular Surgery

## 2014-03-27 ENCOUNTER — Encounter: Payer: Self-pay | Admitting: Vascular Surgery

## 2014-03-27 VITALS — BP 169/81 | HR 98 | Resp 16 | Ht 60.0 in | Wt 83.0 lb

## 2014-03-27 DIAGNOSIS — I6522 Occlusion and stenosis of left carotid artery: Secondary | ICD-10-CM | POA: Diagnosis not present

## 2014-03-27 DIAGNOSIS — I6523 Occlusion and stenosis of bilateral carotid arteries: Secondary | ICD-10-CM

## 2014-03-27 DIAGNOSIS — I6529 Occlusion and stenosis of unspecified carotid artery: Secondary | ICD-10-CM | POA: Insufficient documentation

## 2014-03-27 NOTE — Addendum Note (Signed)
Addended by: Mena Goes on: 03/27/2014 03:37 PM   Modules accepted: Orders

## 2014-03-27 NOTE — Progress Notes (Signed)
New Carotid Patient  Referred by:  Rachell Cipro, MD [address] [place],  [id number]  Reason for referral: B carotid stenosis  History of Present Illness  Crystal Noble is a 68 y.o. (06-12-1946) female who presents with chief complaint: abnormal neck tests.  Previous carotid studies demonstrated: RICA >67% stenosis, LICA <20% stenosis.  Patient has no history of TIA or stroke symptom.  The patient has never had amaurosis fugax or monocular blindness.  The patient has never had facial drooping or hemiplegia.  The patient has never had receptive or expressive aphasia.   The patient's risks factors for carotid disease include: HTN, active smoking.  Past Medical History  Diagnosis Date  . Hypertension   . Carotid stenosis, asymptomatic    Past surgical history: none  History   Social History  . Marital Status: Widowed    Spouse Name: N/A    Number of Children: N/A  . Years of Education: N/A   Occupational History  . Not on file.   Social History Main Topics  . Smoking status: Current Every Day Smoker    Types: Cigarettes  . Smokeless tobacco: Never Used  . Alcohol Use: Yes  . Drug Use: No  . Sexual Activity: Not on file   Other Topics Concern  . Not on file   Social History Narrative  . No narrative on file    Family History  Problem Relation Age of Onset  . Hypertension Mother   . Hyperlipidemia Mother   . Heart attack Mother   . Depression Mother     Bi-Polar  . Cancer Father     Lung   Current Outpatient Prescriptions on File Prior to Visit  Medication Sig Dispense Refill  . amLODipine (NORVASC) 5 MG tablet Take 5 mg by mouth daily. Take 1 1/2 tablet daily      . losartan (COZAAR) 100 MG tablet Take 100 mg by mouth daily.      . montelukast (SINGULAIR) 10 MG tablet Take 10 mg by mouth at bedtime.      . Multiple Vitamin (MULTIVITAMIN) capsule Take 1 capsule by mouth daily.      . Omega-3 Fatty Acids (FISH OIL) 1000 MG CAPS Take 1  capsule by mouth daily.      . Calcium Carb-Cholecalciferol (CALTRATE 600+D) 600-800 MG-UNIT TABS Take 1 tablet by mouth daily.      . Calcium Carb-Cholecalciferol 600-800 MG-UNIT TABS Take 1 tablet by mouth daily.      . Omega 3 1000 MG CAPS Take 1 capsule by mouth daily.       No current facility-administered medications on file prior to visit.    No Known Allergies  REVIEW OF SYSTEMS:  (Positives checked otherwise negative)  CARDIOVASCULAR:  []  chest pain, []  chest pressure, []  palpitations, []  shortness of breath when laying flat, []  shortness of breath with exertion,  []  pain in feet when walking, []  pain in feet when laying flat, []  history of blood clot in veins (DVT), []  history of phlebitis, []  swelling in legs, []  varicose veins  PULMONARY:  []  productive cough, []  asthma, []  wheezing  NEUROLOGIC:  []  weakness in arms or legs, []  numbness in arms or legs, []  difficulty speaking or slurred speech, []  temporary loss of vision in one eye, []  dizziness  HEMATOLOGIC:  []  bleeding problems, []  problems with blood clotting too easily  MUSCULOSKEL:  []  joint pain, []  joint swelling  GASTROINTEST:  []  vomiting blood, []   blood in stool     GENITOURINARY:  []  burning with urination, []  blood in urine  PSYCHIATRIC:  []  history of major depression  INTEGUMENTARY:  []  rashes, []  ulcers  CONSTITUTIONAL:  []  fever, []  chills  For VQI Use Only  PRE-ADM LIVING: Home  AMB STATUS: Ambulatory  CAD Sx: None  PRIOR CHF: None  STRESS TEST: [x]  No, [ ]  Normal, [ ]  + ischemia, [ ]  + MI, [ ]  Both  Physical Examination  Filed Vitals:   03/27/14 1310 03/27/14 1316  BP: 163/80 169/81  Pulse: 98 98  Resp:  16  Height:  5' (1.524 m)  Weight:  83 lb (37.649 kg)  SpO2:  99%   Body mass index is 16.21 kg/(m^2).  General: A&O x 3, WD, thin  Head: [place]/AT  Ear/Nose/Throat: Hearing grossly intact, nares w/o erythema or drainage, oropharynx w/o Erythema/Exudate, Mallampati score:  2  Eyes: PERRLA, EOMI  Neck: Supple, no nuchal rigidity, no palpable LAD  Pulmonary: Sym exp, good air movt, CTAB, no rales, rhonchi, & wheezing  Cardiac: RRR, Nl S1, S2, no Murmurs, rubs or gallops  Vascular: Vessel Right Left  Radial Palpable Palpable  Brachial Palpable Palpable  Carotid Palpable, without bruit Palpable, without bruit  Aorta  Not palpable N/A  Femoral Palpable Palpable  Popliteal Palpable Palpable  PT Palpable Palpable  DP Palpable Palpable   Gastrointestinal: soft, NTND, -G/R, - HSM, - masses, - CVAT B  Musculoskeletal: M/S 5/5 throughout , Extremities without ischemic changes   Neurologic: CN 2-12 intact , Pain and light touch intact in extremities , Motor exam as listed above  Psychiatric: Judgment intact, Mood & affect appropriate for pt's clinical situation  Dermatologic: See M/S exam for extremity exam, no rashes otherwise noted  Lymph : No Cervical, Axillary, or Inguinal lymphadenopathy   Non-Invasive Vascular Imaging  L CAROTID DUPLEX (Date: 03/27/2014):   L ICA stenosis: 60-79%  L VA:  patent and antegrade  Calcification may lower velocities  Medical Decision Making  Crystal Noble is a 68 y.o. female who presents with: asx LICA stenosis 92-11%, asx RICA <50%   Due to calcification her carotids, her duplex might be underestimating her disease, so I offered her a CTA Neck.  She is not interested in such at this time and elected longitudinal surveillance.  Based on the patient's vascular studies and examination, I have offered the patient: B carotid duplex q 66months.  I discussed in depth with the patient the nature of atherosclerosis, and emphasized the importance of maximal medical management including strict control of blood pressure, blood glucose, and lipid levels, obtaining regular exercise, antiplatelet agents, and cessation of smoking.    The patient is currently not on a statin.  She reported does not meet medical criteria  for statin need.  The patient is currently not on an anti-platele.  The patient will be started on ASA 81 mg PO daily.  The patient is aware that without maximal medical management the underlying atherosclerotic disease process will progress, limiting the benefit of any interventions.  Thank you for allowing [place] to participate in this patient's care.  Adele Barthel, MD [facility] Office: [phone] Pager: [phone]  03/27/2014, 1:58 PM

## 2014-03-31 DIAGNOSIS — I10 Essential (primary) hypertension: Secondary | ICD-10-CM | POA: Diagnosis not present

## 2014-03-31 DIAGNOSIS — F17200 Nicotine dependence, unspecified, uncomplicated: Secondary | ICD-10-CM | POA: Diagnosis not present

## 2014-03-31 DIAGNOSIS — I739 Peripheral vascular disease, unspecified: Secondary | ICD-10-CM | POA: Diagnosis not present

## 2014-03-31 DIAGNOSIS — R0989 Other specified symptoms and signs involving the circulatory and respiratory systems: Secondary | ICD-10-CM | POA: Diagnosis not present

## 2014-04-21 DIAGNOSIS — I6523 Occlusion and stenosis of bilateral carotid arteries: Secondary | ICD-10-CM | POA: Diagnosis not present

## 2014-04-21 DIAGNOSIS — E780 Pure hypercholesterolemia: Secondary | ICD-10-CM | POA: Diagnosis not present

## 2014-04-21 DIAGNOSIS — R5382 Chronic fatigue, unspecified: Secondary | ICD-10-CM | POA: Diagnosis not present

## 2014-04-21 DIAGNOSIS — I10 Essential (primary) hypertension: Secondary | ICD-10-CM | POA: Diagnosis not present

## 2014-05-05 DIAGNOSIS — E780 Pure hypercholesterolemia: Secondary | ICD-10-CM | POA: Diagnosis not present

## 2014-05-05 DIAGNOSIS — F17209 Nicotine dependence, unspecified, with unspecified nicotine-induced disorders: Secondary | ICD-10-CM | POA: Diagnosis not present

## 2014-05-05 DIAGNOSIS — I10 Essential (primary) hypertension: Secondary | ICD-10-CM | POA: Diagnosis not present

## 2014-05-06 DIAGNOSIS — I739 Peripheral vascular disease, unspecified: Secondary | ICD-10-CM | POA: Diagnosis not present

## 2014-05-06 DIAGNOSIS — R0989 Other specified symptoms and signs involving the circulatory and respiratory systems: Secondary | ICD-10-CM | POA: Diagnosis not present

## 2014-05-06 DIAGNOSIS — I10 Essential (primary) hypertension: Secondary | ICD-10-CM | POA: Diagnosis not present

## 2014-05-07 ENCOUNTER — Ambulatory Visit (HOSPITAL_COMMUNITY): Payer: Medicare Other

## 2014-05-07 ENCOUNTER — Other Ambulatory Visit (HOSPITAL_COMMUNITY): Payer: Self-pay | Admitting: Family Medicine

## 2014-05-07 DIAGNOSIS — R5383 Other fatigue: Secondary | ICD-10-CM

## 2014-05-07 DIAGNOSIS — R0989 Other specified symptoms and signs involving the circulatory and respiratory systems: Secondary | ICD-10-CM

## 2014-05-07 DIAGNOSIS — I739 Peripheral vascular disease, unspecified: Secondary | ICD-10-CM

## 2014-05-09 ENCOUNTER — Ambulatory Visit (HOSPITAL_COMMUNITY)
Admission: RE | Admit: 2014-05-09 | Discharge: 2014-05-09 | Disposition: A | Discharge status: Home / Self Care | Payer: Medicare Other | Source: Ambulatory Visit | Attending: Family Medicine | Admitting: Family Medicine

## 2014-05-09 ENCOUNTER — Encounter (HOSPITAL_COMMUNITY): Payer: Medicare Other

## 2014-05-09 DIAGNOSIS — I739 Peripheral vascular disease, unspecified: Secondary | ICD-10-CM

## 2014-05-09 DIAGNOSIS — R0989 Other specified symptoms and signs involving the circulatory and respiratory systems: Secondary | ICD-10-CM | POA: Diagnosis not present

## 2014-05-09 DIAGNOSIS — R5383 Other fatigue: Secondary | ICD-10-CM

## 2014-05-09 NOTE — Progress Notes (Signed)
VASCULAR LAB PRELIMINARY  ARTERIAL  ABI completed:    RIGHT    LEFT    PRESSURE WAVEFORM  PRESSURE WAVEFORM  BRACHIAL 139 triphasic BRACHIAL 132 triphasic  DP   DP    AT 143 triphasic AT 143 triphasic  PT 131 triphasic PT 138 triphasic  PER   PER    GREAT TOE  NA GREAT TOE  NA    RIGHT LEFT  ABI >1.0 >1.0     Anissia Wessells, RVT 05/09/2014, 10:14 AM

## 2014-05-16 DIAGNOSIS — R5381 Other malaise: Secondary | ICD-10-CM | POA: Diagnosis not present

## 2014-05-16 DIAGNOSIS — R5382 Chronic fatigue, unspecified: Secondary | ICD-10-CM | POA: Diagnosis not present

## 2014-05-16 DIAGNOSIS — I6523 Occlusion and stenosis of bilateral carotid arteries: Secondary | ICD-10-CM | POA: Diagnosis not present

## 2014-05-16 DIAGNOSIS — Z136 Encounter for screening for cardiovascular disorders: Secondary | ICD-10-CM | POA: Diagnosis not present

## 2014-05-27 DIAGNOSIS — I10 Essential (primary) hypertension: Secondary | ICD-10-CM | POA: Diagnosis not present

## 2014-05-27 DIAGNOSIS — R0989 Other specified symptoms and signs involving the circulatory and respiratory systems: Secondary | ICD-10-CM | POA: Diagnosis not present

## 2014-05-27 DIAGNOSIS — F17200 Nicotine dependence, unspecified, uncomplicated: Secondary | ICD-10-CM | POA: Diagnosis not present

## 2014-05-27 DIAGNOSIS — I779 Disorder of arteries and arterioles, unspecified: Secondary | ICD-10-CM | POA: Diagnosis not present

## 2014-08-05 DIAGNOSIS — M19041 Primary osteoarthritis, right hand: Secondary | ICD-10-CM | POA: Diagnosis not present

## 2014-08-05 DIAGNOSIS — M7071 Other bursitis of hip, right hip: Secondary | ICD-10-CM | POA: Diagnosis not present

## 2014-08-05 DIAGNOSIS — G5701 Lesion of sciatic nerve, right lower limb: Secondary | ICD-10-CM | POA: Diagnosis not present

## 2014-08-26 DIAGNOSIS — I10 Essential (primary) hypertension: Secondary | ICD-10-CM | POA: Diagnosis not present

## 2014-08-26 DIAGNOSIS — F411 Generalized anxiety disorder: Secondary | ICD-10-CM | POA: Diagnosis not present

## 2014-08-26 DIAGNOSIS — I779 Disorder of arteries and arterioles, unspecified: Secondary | ICD-10-CM | POA: Diagnosis not present

## 2014-08-26 DIAGNOSIS — E785 Hyperlipidemia, unspecified: Secondary | ICD-10-CM | POA: Diagnosis not present

## 2014-09-19 DIAGNOSIS — E785 Hyperlipidemia, unspecified: Secondary | ICD-10-CM | POA: Diagnosis not present

## 2014-09-19 DIAGNOSIS — F411 Generalized anxiety disorder: Secondary | ICD-10-CM | POA: Diagnosis not present

## 2014-09-19 DIAGNOSIS — I10 Essential (primary) hypertension: Secondary | ICD-10-CM | POA: Diagnosis not present

## 2014-09-25 ENCOUNTER — Encounter: Payer: Self-pay | Admitting: Vascular Surgery

## 2014-09-26 ENCOUNTER — Ambulatory Visit (HOSPITAL_COMMUNITY)
Admission: RE | Admit: 2014-09-26 | Discharge: 2014-09-26 | Disposition: A | Discharge status: Home / Self Care | Payer: Medicare Other | Source: Ambulatory Visit | Attending: Vascular Surgery | Admitting: Vascular Surgery

## 2014-09-26 ENCOUNTER — Encounter: Payer: Self-pay | Admitting: Vascular Surgery

## 2014-09-26 ENCOUNTER — Ambulatory Visit (INDEPENDENT_AMBULATORY_CARE_PROVIDER_SITE_OTHER): Payer: Medicare Other | Admitting: Vascular Surgery

## 2014-09-26 VITALS — BP 150/82 | HR 103 | Ht 60.0 in | Wt 84.0 lb

## 2014-09-26 DIAGNOSIS — I6523 Occlusion and stenosis of bilateral carotid arteries: Secondary | ICD-10-CM | POA: Diagnosis not present

## 2014-09-26 NOTE — Progress Notes (Signed)
    Established Carotid Patient  History of Present Illness  Crystal Noble is a 69 y.o. (06-30-1945) female who presents with chief complaint: routine follow-up.  Previous carotid studies demonstrated: RICA <90% stenosis, LICA 38-33% stenosis with calcification.  Pt did not want CTA neck to evluate the L ICA stenosis.  Patient has no history of TIA or stroke symptom.  The patient has never had amaurosis fugax or monocular blindness.  The patient has never had facial drooping or hemiplegia.  The patient has never had receptive or expressive aphasia.    The patient's PMH, PSH, SH, FamHx, Med, and Allergies are unchanged from 03/27/14.  On ROS today: no CVA or TIA sx, no intermittent claudication   Physical Examination  Filed Vitals:   09/26/14 1401 09/26/14 1403  BP: 148/81 150/82  Pulse: 101 103  Height: 5' (1.524 m)   Weight: 84 lb (38.102 kg)   SpO2: 97%    Body mass index is 16.41 kg/(m^2).  General: A&O x 3, WD, thin  Eyes: PERRLA, EOMI  Neck: Supple, no nuchal rigidity, no palpable LAD  Pulmonary: Sym exp, good air movt, CTAB, no rales, rhonchi, & wheezing  Cardiac: RRR, Nl S1, S2, no Murmurs, rubs or gallops, valve splitting sound  Vascular: Vessel Right Left  Radial Palpable Palpable  Brachial Palpable Palpable  Carotid Palpable, with faint bruit Palpable, with faint bruit  Aorta Not palpable N/A  Femoral Palpable Palpable  Popliteal Not palpable Not palpable  PT Palpable Palpable  DP Palpable Palpable   Gastrointestinal: soft, NTND, -G/R, - HSM, - masses, - CVAT B, + AAA ,   Musculoskeletal: M/S 5/5 throughout , Extremities without ischemic changes   Neurologic: CN 2-12 intact , Pain and light touch intact in extremities , Motor exam as listed above  Non-Invasive Vascular Imaging  CAROTID DUPLEX (Date: 09/26/2014 ):   R ICA stenosis: <40%  R VA:  patent and antegrade  L ICA stenosis: 60-79%  L VA: patent and antegrade  Medical Decision  Making  Crystal Noble is a 69 y.o. female who presents with: asx R ICA stenosis <40%., asx L ICA 60-79% (possible greater due to calcification)   Pt continues to decline CTA.  Based on the patient's vascular studies and examination, I have offered the patient: q6 carotid duplex.  I discussed in depth with the patient the nature of atherosclerosis, and emphasized the importance of maximal medical management including strict control of blood pressure, blood glucose, and lipid levels, antiplatelet agents, obtaining regular exercise, and cessation of smoking.    The patient is aware that without maximal medical management the underlying atherosclerotic disease process will progress, limiting the benefit of any interventions. The patient is currently on a statin: Lipitor. The patient is currently on an anti-platelet: ASA.  Thank you for allowing [place] to participate in this patient's care.  Adele Barthel, MD [facility] Office: [phone] Pager: [phone]  09/26/2014, 2:22 PM

## 2014-09-26 NOTE — Addendum Note (Signed)
Addended by: Mena Goes on: 09/26/2014 04:17 PM   Modules accepted: Orders

## 2014-10-07 DIAGNOSIS — F17200 Nicotine dependence, unspecified, uncomplicated: Secondary | ICD-10-CM | POA: Diagnosis not present

## 2014-10-07 DIAGNOSIS — F411 Generalized anxiety disorder: Secondary | ICD-10-CM | POA: Diagnosis not present

## 2014-10-07 DIAGNOSIS — R634 Abnormal weight loss: Secondary | ICD-10-CM | POA: Diagnosis not present

## 2014-10-07 DIAGNOSIS — I10 Essential (primary) hypertension: Secondary | ICD-10-CM | POA: Diagnosis not present

## 2014-11-04 DIAGNOSIS — E785 Hyperlipidemia, unspecified: Secondary | ICD-10-CM | POA: Diagnosis not present

## 2014-11-04 DIAGNOSIS — I10 Essential (primary) hypertension: Secondary | ICD-10-CM | POA: Diagnosis not present

## 2014-11-04 DIAGNOSIS — F17200 Nicotine dependence, unspecified, uncomplicated: Secondary | ICD-10-CM | POA: Diagnosis not present

## 2014-11-04 DIAGNOSIS — F411 Generalized anxiety disorder: Secondary | ICD-10-CM | POA: Diagnosis not present

## 2014-12-09 DIAGNOSIS — I10 Essential (primary) hypertension: Secondary | ICD-10-CM | POA: Diagnosis not present

## 2014-12-09 DIAGNOSIS — E785 Hyperlipidemia, unspecified: Secondary | ICD-10-CM | POA: Diagnosis not present

## 2014-12-09 DIAGNOSIS — R413 Other amnesia: Secondary | ICD-10-CM | POA: Diagnosis not present

## 2014-12-09 DIAGNOSIS — F411 Generalized anxiety disorder: Secondary | ICD-10-CM | POA: Diagnosis not present

## 2015-03-09 DIAGNOSIS — R0989 Other specified symptoms and signs involving the circulatory and respiratory systems: Secondary | ICD-10-CM | POA: Diagnosis not present

## 2015-03-09 DIAGNOSIS — I10 Essential (primary) hypertension: Secondary | ICD-10-CM | POA: Diagnosis not present

## 2015-03-09 DIAGNOSIS — F17200 Nicotine dependence, unspecified, uncomplicated: Secondary | ICD-10-CM | POA: Diagnosis not present

## 2015-03-09 DIAGNOSIS — L659 Nonscarring hair loss, unspecified: Secondary | ICD-10-CM | POA: Diagnosis not present

## 2015-03-09 DIAGNOSIS — R634 Abnormal weight loss: Secondary | ICD-10-CM | POA: Diagnosis not present

## 2015-03-09 DIAGNOSIS — F411 Generalized anxiety disorder: Secondary | ICD-10-CM | POA: Diagnosis not present

## 2015-03-09 DIAGNOSIS — Z23 Encounter for immunization: Secondary | ICD-10-CM | POA: Diagnosis not present

## 2015-03-31 ENCOUNTER — Encounter: Payer: Self-pay | Admitting: Family

## 2015-04-03 ENCOUNTER — Ambulatory Visit (HOSPITAL_COMMUNITY)
Admission: RE | Admit: 2015-04-03 | Discharge: 2015-04-03 | Disposition: A | Discharge status: Home / Self Care | Payer: Medicare Other | Source: Ambulatory Visit | Attending: Family | Admitting: Family

## 2015-04-03 ENCOUNTER — Encounter: Payer: Self-pay | Admitting: Family

## 2015-04-03 ENCOUNTER — Other Ambulatory Visit: Payer: Self-pay | Admitting: Vascular Surgery

## 2015-04-03 ENCOUNTER — Ambulatory Visit (INDEPENDENT_AMBULATORY_CARE_PROVIDER_SITE_OTHER): Payer: Medicare Other | Admitting: Family

## 2015-04-03 VITALS — BP 168/88 | HR 99 | Temp 97.9°F | Resp 14 | Ht 60.0 in | Wt 82.0 lb

## 2015-04-03 DIAGNOSIS — I6523 Occlusion and stenosis of bilateral carotid arteries: Secondary | ICD-10-CM

## 2015-04-03 DIAGNOSIS — I10 Essential (primary) hypertension: Secondary | ICD-10-CM | POA: Diagnosis not present

## 2015-04-03 DIAGNOSIS — Z720 Tobacco use: Secondary | ICD-10-CM | POA: Diagnosis not present

## 2015-04-03 DIAGNOSIS — F17200 Nicotine dependence, unspecified, uncomplicated: Secondary | ICD-10-CM

## 2015-04-03 NOTE — Progress Notes (Signed)
Established Carotid Patient   History of Present Illness  Crystal Noble is a 69 y.o. female patient of Dr. Bridgett Larsson who presents with chief complaint: routine follow-up. Previous carotid studies demonstrated: RICA <31% stenosis, LICA 51-76% stenosis with calcification. Pt did not want CTA neck to evluate the L ICA stenosis. Patient has no history of TIA or stroke symptom. The patient has never had amaurosis fugax or monocular blindness. The patient has never had facial drooping or hemiplegia. The patient has never had receptive or expressive aphasia.   On ROS today:  no intermittent claudication     Patient has not had previous carotid artery intervention.  The patient denies New Medical or Surgical History.  Pt Diabetic: no Pt smoker: smoker  (1/2 ppd, started at age 32 yrs)  Pt meds include: Statin : no, she tried a statin and felt very anxious ASA: yes Other anticoagulants/antiplatelets: no   Past Medical History  Diagnosis Date  . Hypertension   . Carotid stenosis, asymptomatic     Social History Social History  Substance Use Topics  . Smoking status: Current Every Day Smoker -- 0.50 packs/day for 30 years    Types: Cigarettes  . Smokeless tobacco: Never Used  . Alcohol Use: 12.6 oz/week    21 Glasses of wine per week    Family History Family History  Problem Relation Age of Onset  . Hypertension Mother   . Hyperlipidemia Mother   . Heart attack Mother   . Depression Mother     Bi-Polar  . Cancer Father     Lung    Surgical History Past Surgical History  Procedure Laterality Date  . Appendectomy      No Known Allergies  Current Outpatient Prescriptions  Medication Sig Dispense Refill  . amLODipine (NORVASC) 10 MG tablet     . aspirin 81 MG tablet Take 81 mg by mouth daily.    Marland Kitchen buPROPion (WELLBUTRIN XL) 150 MG 24 hr tablet Take 150 mg by mouth daily.    . Calcium Carb-Cholecalciferol 600-800 MG-UNIT TABS Take 1 tablet by mouth daily.    .  calcium-vitamin D 250-100 MG-UNIT per tablet Take 1 tablet by mouth daily. 600 mg daily    . co-enzyme Q-10 30 MG capsule Take 400 mg by mouth 3 (three) times daily.    . megestrol (MEGACE) 40 MG tablet Take 40 mg by mouth daily.    . Multiple Vitamin (MULTIVITAMIN) capsule Take 1 capsule by mouth daily.    . Omega-3 Fatty Acids (FISH OIL) 1000 MG CAPS Take 1 capsule by mouth daily.    . valsartan (DIOVAN) 160 MG tablet     . amLODipine (NORVASC) 5 MG tablet Take 10 mg by mouth daily. Take 1 1/2 tablet daily    . atorvastatin (LIPITOR) 40 MG tablet Take 40 mg by mouth daily at 6 PM.     . escitalopram (LEXAPRO) 5 MG tablet     . losartan (COZAAR) 100 MG tablet Take 100 mg by mouth daily.    . montelukast (SINGULAIR) 10 MG tablet Take 10 mg by mouth at bedtime.     No current facility-administered medications for this visit.    Review of Systems : See HPI for pertinent positives and negatives.  Physical Examination  Filed Vitals:   04/03/15 1515 04/03/15 1517 04/03/15 1518  BP: 183/85 177/94 168/88  Pulse: 99 99 99  Temp: 97.9 F (36.6 C)    Resp: 14    Height: 5' (1.524 m)  Weight: 82 lb (37.195 kg)    SpO2: 100%     Body mass index is 16.01 kg/(m^2).  General: A&O x 3, WD, thin female  Eyes: PERRLA,  Pulmonary: Sym exp, fair air movt, CTAB, no rales, rhonchi, or wheezing  Cardiac: RRR, Nl S1, S2, no Murmurs, valve splitting sound  Vascular: Vessel Right Left  Radial Palpable Palpable  Carotid Palpable, with faint bruit Palpable, with faint bruit  Aorta Not palpable N/A  Popliteal Not palpable Not palpable  PT Faintly Palpable Faintly Palpable  DP Palpable Palpable   Gastrointestinal: soft, NTND, -G/R, - HSM, - palpable masses, - CVAT B   Musculoskeletal: M/S 5/5 throughout , Extremities without ischemic changes   Neurologic: CN 2-12 intact , Pain and light touch intact in extremities , Motor exam as listed above          Non-Invasive  Vascular Imaging CAROTID DUPLEX 04/03/2015   CEREBROVASCULAR DUPLEX EVALUATION    INDICATION: Carotid artery stenosis    PREVIOUS INTERVENTION(S): NA    DUPLEX EXAM:     RIGHT  LEFT  Peak Systolic Velocities (cm/s) End Diastolic Velocities (cm/s) Plaque LOCATION Peak Systolic Velocities (cm/s) End Diastolic Velocities (cm/s) Plaque  98 10  CCA PROXIMAL 112 18   95 19  CCA MID 91 20   68 18 CP CCA DISTAL 82 22 CP  99 13 HT ECA 335 26 CP  74 20 HT ICA PROXIMAL 265 51 CP  78 24 HT ICA MID 80 17   100 25  ICA DISTAL 62 20      ICA / CCA Ratio (PSV)   Antegrade Vertebral Flow Antegrade  NA Brachial Systolic Pressure (mmHg) NA  NA Brachial Artery Waveforms NA    Plaque Morphology:  HM = Homogeneous, HT = Heterogeneous, CP = Calcific Plaque, SP = Smooth Plaque, IP = Irregular Plaque     ADDITIONAL FINDINGS: Right subclavian artery PSV313cm/sec; Left subclavian artery PSV215cm/sec    IMPRESSION: Right internal carotid artery stenosis present in the less than 40% range. Left internal carotid artery stenosis present in the 40%-59% range, which may be underestimated due to presence of calcific plaque making Doppler interrogation difficult. Left external carotid artery stenosis present.    Compared to the previous exam:  Stable on the right and classification downgraded on the left since study on 09/26/2014.      Assessment: Crystal Noble is a 69 y.o. female who had a carotid bruit but has no history of stroke or TIA. Today's carotid duplex suggests < 40% right ICA stenosis and  40%-59% left internal carotid artery stenosis which may be underestimated due to presence of calcific plaque making Doppler interrogation difficult. Left external carotid artery stenosis present. Stable on the right and classification downgraded on the left since study on 09/26/2014.  Pt advised to see her PCP re her elevated blood pressure, but states her out of control anxiety may elevate her blood  pressure.  Unfortunately she continues to smoke, and states she is unlikely to quit as long as she has uncontrolled anxiety. But she did seem receptive to the free smoking cessation resources.   Plan:  The patient was counseled re smoking cessation and given several free resources re smoking cessation.  Follow-up in 6 months with Carotid Duplex.   I discussed in depth with the patient the nature of atherosclerosis, and emphasized the importance of maximal medical management including strict control of blood pressure, blood glucose, and lipid levels, obtaining regular exercise, and  cessation of smoking.  The patient is aware that without maximal medical management the underlying atherosclerotic disease process will progress, limiting the benefit of any interventions. The patient was given information about stroke prevention and what symptoms should prompt the patient to seek immediate medical care. Thank you for allowing [place] to participate in this patient's care.  Clemon Chambers, RN, MSN, FNP-C [facility] Office: [phone]  Clinic Physician: Bridgett Larsson  04/03/2015 3:29 PM

## 2015-04-03 NOTE — Patient Instructions (Signed)
Stroke Prevention Some medical conditions and behaviors are associated with an increased chance of having a stroke. You may prevent a stroke by making healthy choices and managing medical conditions. HOW CAN I REDUCE MY RISK OF HAVING A STROKE?   Stay physically active. Get at least 30 minutes of activity on most or all days.  Do not smoke. It may also be helpful to avoid exposure to secondhand smoke.  Limit alcohol use. Moderate alcohol use is considered to be:  No more than 2 drinks per day for men.  No more than 1 drink per day for nonpregnant women.  Eat healthy foods. This involves:  Eating 5 or more servings of fruits and vegetables a day.  Making dietary changes that address high blood pressure (hypertension), high cholesterol, diabetes, or obesity.  Manage your cholesterol levels.  Making food choices that are high in fiber and low in saturated fat, trans fat, and cholesterol may control cholesterol levels.  Take any prescribed medicines to control cholesterol as directed by your health care provider.  Manage your diabetes.  Controlling your carbohydrate and sugar intake is recommended to manage diabetes.  Take any prescribed medicines to control diabetes as directed by your health care provider.  Control your hypertension.  Making food choices that are low in salt (sodium), saturated fat, trans fat, and cholesterol is recommended to manage hypertension.  Ask your health care provider if you need treatment to lower your blood pressure. Take any prescribed medicines to control hypertension as directed by your health care provider.  If you are 18-39 years of age, have your blood pressure checked every 3-5 years. If you are 40 years of age or older, have your blood pressure checked every year.  Maintain a healthy weight.  Reducing calorie intake and making food choices that are low in sodium, saturated fat, trans fat, and cholesterol are recommended to manage  weight.  Stop drug abuse.  Avoid taking birth control pills.  Talk to your health care provider about the risks of taking birth control pills if you are over 35 years old, smoke, get migraines, or have ever had a blood clot.  Get evaluated for sleep disorders (sleep apnea).  Talk to your health care provider about getting a sleep evaluation if you snore a lot or have excessive sleepiness.  Take medicines only as directed by your health care provider.  For some people, aspirin or blood thinners (anticoagulants) are helpful in reducing the risk of forming abnormal blood clots that can lead to stroke. If you have the irregular heart rhythm of atrial fibrillation, you should be on a blood thinner unless there is a good reason you cannot take them.  Understand all your medicine instructions.  Make sure that other conditions (such as anemia or atherosclerosis) are addressed. SEEK IMMEDIATE MEDICAL CARE IF:   You have sudden weakness or numbness of the face, arm, or leg, especially on one side of the body.  Your face or eyelid droops to one side.  You have sudden confusion.  You have trouble speaking (aphasia) or understanding.  You have sudden trouble seeing in one or both eyes.  You have sudden trouble walking.  You have dizziness.  You have a loss of balance or coordination.  You have a sudden, severe headache with no known cause.  You have new chest pain or an irregular heartbeat. Any of these symptoms may represent a serious problem that is an emergency. Do not wait to see if the symptoms will   go away. Get medical help at once. Call your local emergency services (911 in [place] Do not drive yourself to the hospital.   This information is not intended to replace advice given to you by your health care provider. Make sure you discuss any questions you have with your health care provider.   Document Released: 07/07/2004 Document Revised: 06/20/2014 Document Reviewed:  11/30/2012 Elsevier Interactive Patient Education 2016 [employer]    Smoking Cessation, Tips for Success If you are ready to quit smoking, congratulations! You have chosen to help yourself be healthier. Cigarettes bring nicotine, tar, carbon monoxide, and other irritants into your body. Your lungs, heart, and blood vessels will be able to work better without these poisons. There are many different ways to quit smoking. Nicotine gum, nicotine patches, a nicotine inhaler, or nicotine nasal spray can help with physical craving. Hypnosis, support groups, and medicines help break the habit of smoking. WHAT THINGS CAN I DO TO MAKE QUITTING EASIER?  Here are some tips to help you quit for good:  Pick a date when you will quit smoking completely. Tell all of your friends and family about your plan to quit on that date.  Do not try to slowly cut down on the number of cigarettes you are smoking. Pick a quit date and quit smoking completely starting on that day.  Throw away all cigarettes.   Clean and remove all ashtrays from your home, work, and car.  On a card, write down your reasons for quitting. Carry the card with you and read it when you get the urge to smoke.  Cleanse your body of nicotine. Drink enough water and fluids to keep your urine clear or pale yellow. Do this after quitting to flush the nicotine from your body.  Learn to predict your moods. Do not let a bad situation be your excuse to have a cigarette. Some situations in your life might tempt you into wanting a cigarette.  Never have "just one" cigarette. It leads to wanting another and another. Remind yourself of your decision to quit.  Change habits associated with smoking. If you smoked while driving or when feeling stressed, try other activities to replace smoking. Stand up when drinking your coffee. Brush your teeth after eating. Sit in a different chair when you read the paper. Avoid alcohol while trying to quit, and try to  drink fewer caffeinated beverages. Alcohol and caffeine may urge you to smoke.  Avoid foods and drinks that can trigger a desire to smoke, such as sugary or spicy foods and alcohol.  Ask people who smoke not to smoke around you.  Have something planned to do right after eating or having a cup of coffee. For example, plan to take a walk or exercise.  Try a relaxation exercise to calm you down and decrease your stress. Remember, you may be tense and nervous for the first 2 weeks after you quit, but this will pass.  Find new activities to keep your hands busy. Play with a pen, coin, or rubber band. Doodle or draw things on paper.  Brush your teeth right after eating. This will help cut down on the craving for the taste of tobacco after meals. You can also try mouthwash.   Use oral substitutes in place of cigarettes. Try using lemon drops, carrots, cinnamon sticks, or chewing gum. Keep them handy so they are available when you have the urge to smoke.  When you have the urge to smoke, try deep breathing.    Designate your home as a nonsmoking area.  If you are a heavy smoker, ask your health care provider about a prescription for nicotine chewing gum. It can ease your withdrawal from nicotine.  Reward yourself. Set aside the cigarette money you save and buy yourself something nice.  Look for support from others. Join a support group or smoking cessation program. Ask someone at home or at work to help you with your plan to quit smoking.  Always ask yourself, "Do I need this cigarette or is this just a reflex?" Tell yourself, "Today, I choose not to smoke," or "I do not want to smoke." You are reminding yourself of your decision to quit.  Do not replace cigarette smoking with electronic cigarettes (commonly called e-cigarettes). The safety of e-cigarettes is unknown, and some may contain harmful chemicals.  If you relapse, do not give up! Plan ahead and think about what you will do the next  time you get the urge to smoke. HOW WILL I FEEL WHEN I QUIT SMOKING? You may have symptoms of withdrawal because your body is used to nicotine (the addictive substance in cigarettes). You may crave cigarettes, be irritable, feel very hungry, cough often, get headaches, or have difficulty concentrating. The withdrawal symptoms are only temporary. They are strongest when you first quit but will go away within 10-14 days. When withdrawal symptoms occur, stay in control. Think about your reasons for quitting. Remind yourself that these are signs that your body is healing and getting used to being without cigarettes. Remember that withdrawal symptoms are easier to treat than the major diseases that smoking can cause.  Even after the withdrawal is over, expect periodic urges to smoke. However, these cravings are generally short lived and will go away whether you smoke or not. Do not smoke! WHAT RESOURCES ARE AVAILABLE TO HELP ME QUIT SMOKING? Your health care provider can direct you to community resources or hospitals for support, which may include:  Group support.  Education.  Hypnosis.  Therapy.   This information is not intended to replace advice given to you by your health care provider. Make sure you discuss any questions you have with your health care provider.   Document Released: 02/26/2004 Document Revised: 06/20/2014 Document Reviewed: 11/15/2012 Elsevier Interactive Patient Education 2016 [employer]   Steps to Quit Smoking  Smoking tobacco can be harmful to your health and can affect almost every organ in your body. Smoking puts you, and those around you, at risk for developing many serious chronic diseases. Quitting smoking is difficult, but it is one of the best things that you can do for your health. It is never too late to quit. WHAT ARE THE BENEFITS OF QUITTING SMOKING? When you quit smoking, you lower your risk of developing serious diseases and conditions, such as:  Lung  cancer or lung disease, such as COPD.  Heart disease.  Stroke.  Heart attack.  Infertility.  Osteoporosis and bone fractures. Additionally, symptoms such as coughing, wheezing, and shortness of breath may get better when you quit. You may also find that you get sick less often because your body is stronger at fighting off colds and infections. If you are pregnant, quitting smoking can help to reduce your chances of having a baby of low birth weight. HOW DO I GET READY TO QUIT? When you decide to quit smoking, create a plan to make sure that you are successful. Before you quit:  Pick a date to quit. Set a date within the   next two weeks to give you time to prepare.  Write down the reasons why you are quitting. Keep this list in places where you will see it often, such as on your bathroom mirror or in your car or wallet.  Identify the people, places, things, and activities that make you want to smoke (triggers) and avoid them. Make sure to take these actions:  Throw away all cigarettes at home, at work, and in your car.  Throw away smoking accessories, such as ashtrays and lighters.  Clean your car and make sure to empty the ashtray.  Clean your home, including curtains and carpets.  Tell your family, friends, and coworkers that you are quitting. Support from your loved ones can make quitting easier.  Talk with your health care provider about your options for quitting smoking.  Find out what treatment options are covered by your health insurance. WHAT STRATEGIES CAN I USE TO QUIT SMOKING?  Talk with your healthcare provider about different strategies to quit smoking. Some strategies include:  Quitting smoking altogether instead of gradually lessening how much you smoke over a period of time. Research shows that quitting "cold turkey" is more successful than gradually quitting.  Attending in-person counseling to help you build problem-solving skills. You are more likely to have  success in quitting if you attend several counseling sessions. Even short sessions of 10 minutes can be effective.  Finding resources and support systems that can help you to quit smoking and remain smoke-free after you quit. These resources are most helpful when you use them often. They can include:  Online chats with a counselor.  Telephone quitlines.  Printed self-help materials.  Support groups or group counseling.  Text messaging programs.  Mobile phone applications.  Taking medicines to help you quit smoking. (If you are pregnant or breastfeeding, talk with your health care provider first.) Some medicines contain nicotine and some do not. Both types of medicines help with cravings, but the medicines that include nicotine help to relieve withdrawal symptoms. Your health care provider may recommend:  Nicotine patches, gum, or lozenges.  Nicotine inhalers or sprays.  Non-nicotine medicine that is taken by mouth. Talk with your health care provider about combining strategies, such as taking medicines while you are also receiving in-person counseling. Using these two strategies together makes you more likely to succeed in quitting than if you used either strategy on its own. If you are pregnant or breastfeeding, talk with your health care provider about finding counseling or other support strategies to quit smoking. Do not take medicine to help you quit smoking unless told to do so by your health care provider. WHAT THINGS CAN I DO TO MAKE IT EASIER TO QUIT? Quitting smoking might feel overwhelming at first, but there is a lot that you can do to make it easier. Take these important actions:  Reach out to your family and friends and ask that they support and encourage you during this time. Call telephone quitlines, reach out to support groups, or work with a counselor for support.  Ask people who smoke to avoid smoking around you.  Avoid places that trigger you to smoke, such as bars,  parties, or smoke-break areas at work.  Spend time around people who do not smoke.  Lessen stress in your life, because stress can be a smoking trigger for some people. To lessen stress, try:  Exercising regularly.  Deep-breathing exercises.  Yoga.  Meditating.  Performing a body scan. This involves closing your eyes, scanning   your body from head to toe, and noticing which parts of your body are particularly tense. Purposefully relax the muscles in those areas.  Download or purchase mobile phone or tablet apps (applications) that can help you stick to your quit plan by providing reminders, tips, and encouragement. There are many free apps, such as QuitGuide from the CDC (Centers for Disease Control and Prevention). You can find other support for quitting smoking (smoking cessation) through smokefree.gov and other websites. HOW WILL I FEEL WHEN I QUIT SMOKING? Within the first 24 hours of quitting smoking, you may start to feel some withdrawal symptoms. These symptoms are usually most noticeable 2-3 days after quitting, but they usually do not last beyond 2-3 weeks. Changes or symptoms that you might experience include:  Mood swings.  Restlessness, anxiety, or irritation.  Difficulty concentrating.  Dizziness.  Strong cravings for sugary foods in addition to nicotine.  Mild weight gain.  Constipation.  Nausea.  Coughing or a sore throat.  Changes in how your medicines work in your body.  A depressed mood.  Difficulty sleeping (insomnia). After the first 2-3 weeks of quitting, you may start to notice more positive results, such as:  Improved sense of smell and taste.  Decreased coughing and sore throat.  Slower heart rate.  Lower blood pressure.  Clearer skin.  The ability to breathe more easily.  Fewer sick days. Quitting smoking is very challenging for most people. Do not get discouraged if you are not successful the first time. Some people need to make many  attempts to quit before they achieve long-term success. Do your best to stick to your quit plan, and talk with your health care provider if you have any questions or concerns.   This information is not intended to replace advice given to you by your health care provider. Make sure you discuss any questions you have with your health care provider.   Document Released: 05/24/2001 Document Revised: 10/14/2014 Document Reviewed: 10/14/2014 Elsevier Interactive Patient Education 2016 [employer]   

## 2015-04-06 NOTE — Addendum Note (Signed)
Addended by: Dorthula Rue L on: 04/06/2015 09:08 AM   Modules accepted: Orders

## 2015-04-21 ENCOUNTER — Other Ambulatory Visit: Payer: Self-pay

## 2015-04-21 DIAGNOSIS — F411 Generalized anxiety disorder: Secondary | ICD-10-CM | POA: Diagnosis not present

## 2015-04-21 DIAGNOSIS — Z1231 Encounter for screening mammogram for malignant neoplasm of breast: Secondary | ICD-10-CM

## 2015-04-21 DIAGNOSIS — R634 Abnormal weight loss: Secondary | ICD-10-CM | POA: Diagnosis not present

## 2015-04-21 DIAGNOSIS — I10 Essential (primary) hypertension: Secondary | ICD-10-CM | POA: Diagnosis not present

## 2015-05-04 ENCOUNTER — Ambulatory Visit
Admission: RE | Admit: 2015-05-04 | Discharge: 2015-05-04 | Disposition: A | Discharge status: Home / Self Care | Payer: Medicare Other | Source: Ambulatory Visit

## 2015-05-04 DIAGNOSIS — Z1231 Encounter for screening mammogram for malignant neoplasm of breast: Secondary | ICD-10-CM | POA: Diagnosis not present

## 2015-06-09 DIAGNOSIS — R634 Abnormal weight loss: Secondary | ICD-10-CM | POA: Diagnosis not present

## 2015-06-09 DIAGNOSIS — F411 Generalized anxiety disorder: Secondary | ICD-10-CM | POA: Diagnosis not present

## 2015-06-09 DIAGNOSIS — K117 Disturbances of salivary secretion: Secondary | ICD-10-CM | POA: Diagnosis not present

## 2015-06-09 DIAGNOSIS — I10 Essential (primary) hypertension: Secondary | ICD-10-CM | POA: Diagnosis not present

## 2015-07-21 DIAGNOSIS — F411 Generalized anxiety disorder: Secondary | ICD-10-CM | POA: Diagnosis not present

## 2015-07-21 DIAGNOSIS — R634 Abnormal weight loss: Secondary | ICD-10-CM | POA: Diagnosis not present

## 2015-07-21 DIAGNOSIS — R197 Diarrhea, unspecified: Secondary | ICD-10-CM | POA: Diagnosis not present

## 2015-08-11 DIAGNOSIS — R634 Abnormal weight loss: Secondary | ICD-10-CM | POA: Diagnosis not present

## 2015-08-11 DIAGNOSIS — F411 Generalized anxiety disorder: Secondary | ICD-10-CM | POA: Diagnosis not present

## 2015-08-11 DIAGNOSIS — R197 Diarrhea, unspecified: Secondary | ICD-10-CM | POA: Diagnosis not present

## 2015-08-11 DIAGNOSIS — I10 Essential (primary) hypertension: Secondary | ICD-10-CM | POA: Diagnosis not present

## 2015-09-23 ENCOUNTER — Encounter: Payer: Self-pay | Admitting: Family

## 2015-10-02 ENCOUNTER — Encounter: Payer: Self-pay | Admitting: Family

## 2015-10-02 ENCOUNTER — Ambulatory Visit (HOSPITAL_COMMUNITY)
Admission: RE | Admit: 2015-10-02 | Discharge: 2015-10-02 | Disposition: A | Discharge status: Home / Self Care | Payer: Medicare Other | Source: Ambulatory Visit | Attending: Family | Admitting: Family

## 2015-10-02 ENCOUNTER — Ambulatory Visit (INDEPENDENT_AMBULATORY_CARE_PROVIDER_SITE_OTHER): Payer: Medicare Other | Admitting: Family

## 2015-10-02 VITALS — BP 152/84 | HR 86 | Ht 60.0 in | Wt 81.0 lb

## 2015-10-02 DIAGNOSIS — F17200 Nicotine dependence, unspecified, uncomplicated: Secondary | ICD-10-CM

## 2015-10-02 DIAGNOSIS — I6523 Occlusion and stenosis of bilateral carotid arteries: Secondary | ICD-10-CM | POA: Diagnosis not present

## 2015-10-02 DIAGNOSIS — I10 Essential (primary) hypertension: Secondary | ICD-10-CM | POA: Diagnosis not present

## 2015-10-02 DIAGNOSIS — Z720 Tobacco use: Secondary | ICD-10-CM

## 2015-10-02 DIAGNOSIS — R197 Diarrhea, unspecified: Secondary | ICD-10-CM | POA: Diagnosis not present

## 2015-10-02 NOTE — Patient Instructions (Signed)
Stroke Prevention Some medical conditions and behaviors are associated with an increased chance of having a stroke. You may prevent a stroke by making healthy choices and managing medical conditions. HOW CAN I REDUCE MY RISK OF HAVING A STROKE?   Stay physically active. Get at least 30 minutes of activity on most or all days.  Do not smoke. It may also be helpful to avoid exposure to secondhand smoke.  Limit alcohol use. Moderate alcohol use is considered to be:  No more than 2 drinks per day for men.  No more than 1 drink per day for nonpregnant women.  Eat healthy foods. This involves:  Eating 5 or more servings of fruits and vegetables a day.  Making dietary changes that address high blood pressure (hypertension), high cholesterol, diabetes, or obesity.  Manage your cholesterol levels.  Making food choices that are high in fiber and low in saturated fat, trans fat, and cholesterol may control cholesterol levels.  Take any prescribed medicines to control cholesterol as directed by your health care provider.  Manage your diabetes.  Controlling your carbohydrate and sugar intake is recommended to manage diabetes.  Take any prescribed medicines to control diabetes as directed by your health care provider.  Control your hypertension.  Making food choices that are low in salt (sodium), saturated fat, trans fat, and cholesterol is recommended to manage hypertension.  Ask your health care provider if you need treatment to lower your blood pressure. Take any prescribed medicines to control hypertension as directed by your health care provider.  If you are 18-39 years of age, have your blood pressure checked every 3-5 years. If you are 40 years of age or older, have your blood pressure checked every year.  Maintain a healthy weight.  Reducing calorie intake and making food choices that are low in sodium, saturated fat, trans fat, and cholesterol are recommended to manage  weight.  Stop drug abuse.  Avoid taking birth control pills.  Talk to your health care provider about the risks of taking birth control pills if you are over 35 years old, smoke, get migraines, or have ever had a blood clot.  Get evaluated for sleep disorders (sleep apnea).  Talk to your health care provider about getting a sleep evaluation if you snore a lot or have excessive sleepiness.  Take medicines only as directed by your health care provider.  For some people, aspirin or blood thinners (anticoagulants) are helpful in reducing the risk of forming abnormal blood clots that can lead to stroke. If you have the irregular heart rhythm of atrial fibrillation, you should be on a blood thinner unless there is a good reason you cannot take them.  Understand all your medicine instructions.  Make sure that other conditions (such as anemia or atherosclerosis) are addressed. SEEK IMMEDIATE MEDICAL CARE IF:   You have sudden weakness or numbness of the face, arm, or leg, especially on one side of the body.  Your face or eyelid droops to one side.  You have sudden confusion.  You have trouble speaking (aphasia) or understanding.  You have sudden trouble seeing in one or both eyes.  You have sudden trouble walking.  You have dizziness.  You have a loss of balance or coordination.  You have a sudden, severe headache with no known cause.  You have new chest pain or an irregular heartbeat. Any of these symptoms may represent a serious problem that is an emergency. Do not wait to see if the symptoms will   go away. Get medical help at once. Call your local emergency services (911 in [place] Do not drive yourself to the hospital.   This information is not intended to replace advice given to you by your health care provider. Make sure you discuss any questions you have with your health care provider.   Document Released: 07/07/2004 Document Revised: 06/20/2014 Document Reviewed:  11/30/2012 Elsevier Interactive Patient Education 2016 Elsevier Inc.     Steps to Quit Smoking  Smoking tobacco can be harmful to your health and can affect almost every organ in your body. Smoking puts you, and those around you, at risk for developing many serious chronic diseases. Quitting smoking is difficult, but it is one of the best things that you can do for your health. It is never too late to quit. WHAT ARE THE BENEFITS OF QUITTING SMOKING? When you quit smoking, you lower your risk of developing serious diseases and conditions, such as:  Lung cancer or lung disease, such as COPD.  Heart disease.  Stroke.  Heart attack.  Infertility.  Osteoporosis and bone fractures. Additionally, symptoms such as coughing, wheezing, and shortness of breath may get better when you quit. You may also find that you get sick less often because your body is stronger at fighting off colds and infections. If you are pregnant, quitting smoking can help to reduce your chances of having a baby of low birth weight. HOW DO I GET READY TO QUIT? When you decide to quit smoking, create a plan to make sure that you are successful. Before you quit:  Pick a date to quit. Set a date within the next two weeks to give you time to prepare.  Write down the reasons why you are quitting. Keep this list in places where you will see it often, such as on your bathroom mirror or in your car or wallet.  Identify the people, places, things, and activities that make you want to smoke (triggers) and avoid them. Make sure to take these actions:  Throw away all cigarettes at home, at work, and in your car.  Throw away smoking accessories, such as ashtrays and lighters.  Clean your car and make sure to empty the ashtray.  Clean your home, including curtains and carpets.  Tell your family, friends, and coworkers that you are quitting. Support from your loved ones can make quitting easier.  Talk with your health care  provider about your options for quitting smoking.  Find out what treatment options are covered by your health insurance. WHAT STRATEGIES CAN I USE TO QUIT SMOKING?  Talk with your healthcare provider about different strategies to quit smoking. Some strategies include:  Quitting smoking altogether instead of gradually lessening how much you smoke over a period of time. Research shows that quitting "cold turkey" is more successful than gradually quitting.  Attending in-person counseling to help you build problem-solving skills. You are more likely to have success in quitting if you attend several counseling sessions. Even short sessions of 10 minutes can be effective.  Finding resources and support systems that can help you to quit smoking and remain smoke-free after you quit. These resources are most helpful when you use them often. They can include:  Online chats with a counselor.  Telephone quitlines.  Printed self-help materials.  Support groups or group counseling.  Text messaging programs.  Mobile phone applications.  Taking medicines to help you quit smoking. (If you are pregnant or breastfeeding, talk with your health care provider first.) Some   medicines contain nicotine and some do not. Both types of medicines help with cravings, but the medicines that include nicotine help to relieve withdrawal symptoms. Your health care provider may recommend:  Nicotine patches, gum, or lozenges.  Nicotine inhalers or sprays.  Non-nicotine medicine that is taken by mouth. Talk with your health care provider about combining strategies, such as taking medicines while you are also receiving in-person counseling. Using these two strategies together makes you more likely to succeed in quitting than if you used either strategy on its own. If you are pregnant or breastfeeding, talk with your health care provider about finding counseling or other support strategies to quit smoking. Do not take  medicine to help you quit smoking unless told to do so by your health care provider. WHAT THINGS CAN I DO TO MAKE IT EASIER TO QUIT? Quitting smoking might feel overwhelming at first, but there is a lot that you can do to make it easier. Take these important actions:  Reach out to your family and friends and ask that they support and encourage you during this time. Call telephone quitlines, reach out to support groups, or work with a counselor for support.  Ask people who smoke to avoid smoking around you.  Avoid places that trigger you to smoke, such as bars, parties, or smoke-break areas at work.  Spend time around people who do not smoke.  Lessen stress in your life, because stress can be a smoking trigger for some people. To lessen stress, try:  Exercising regularly.  Deep-breathing exercises.  Yoga.  Meditating.  Performing a body scan. This involves closing your eyes, scanning your body from head to toe, and noticing which parts of your body are particularly tense. Purposefully relax the muscles in those areas.  Download or purchase mobile phone or tablet apps (applications) that can help you stick to your quit plan by providing reminders, tips, and encouragement. There are many free apps, such as QuitGuide from the CDC (Centers for Disease Control and Prevention). You can find other support for quitting smoking (smoking cessation) through smokefree.gov and other websites. HOW WILL I FEEL WHEN I QUIT SMOKING? Within the first 24 hours of quitting smoking, you may start to feel some withdrawal symptoms. These symptoms are usually most noticeable 2-3 days after quitting, but they usually do not last beyond 2-3 weeks. Changes or symptoms that you might experience include:  Mood swings.  Restlessness, anxiety, or irritation.  Difficulty concentrating.  Dizziness.  Strong cravings for sugary foods in addition to nicotine.  Mild weight  gain.  Constipation.  Nausea.  Coughing or a sore throat.  Changes in how your medicines work in your body.  A depressed mood.  Difficulty sleeping (insomnia). After the first 2-3 weeks of quitting, you may start to notice more positive results, such as:  Improved sense of smell and taste.  Decreased coughing and sore throat.  Slower heart rate.  Lower blood pressure.  Clearer skin.  The ability to breathe more easily.  Fewer sick days. Quitting smoking is very challenging for most people. Do not get discouraged if you are not successful the first time. Some people need to make many attempts to quit before they achieve long-term success. Do your best to stick to your quit plan, and talk with your health care provider if you have any questions or concerns.   This information is not intended to replace advice given to you by your health care provider. Make sure you discuss any questions   you have with your health care provider.   Document Released: 05/24/2001 Document Revised: 10/14/2014 Document Reviewed: 10/14/2014 Elsevier Interactive Patient Education 2016 Elsevier Inc.  

## 2015-10-02 NOTE — Progress Notes (Signed)
Chief Complaint: Extracranial Carotid Artery Stenosis   History of Present Illness  Crystal Noble is a 70 y.o. female patient of Dr. Bridgett Larsson who presents with chief complaint: routine follow-up. Previous carotid studies demonstrated: RICA [id number] stenosis, LICA [id number] stenosis with calcification. Pt did not want CTA neck to evluate the L ICA stenosis. Patient has no history of TIA or stroke symptom. The patient has never had amaurosis fugax or monocular blindness. The patient has never had facial drooping or hemiplegia. The patient has never had receptive or expressive aphasia.   On ROS today: no intermittent claudication, no post prandial abdominal pain but states her appetite is not good.   Patient has not had previous carotid artery intervention.  The patient denies New Medical or Surgical History.  Pt Diabetic: no Pt smoker: smoker (1/2 ppd, started at age 41 yrs)  Pt meds include: Statin : no, she tried a statin and felt very anxious ASA: yes Other anticoagulants/antiplatelets: no    Past Medical History  Diagnosis Date  . Hypertension   . Carotid stenosis, asymptomatic     Social History Social History  Substance Use Topics  . Smoking status: Current Every Day Smoker -- 0.50 packs/day for 30 years    Types: Cigarettes  . Smokeless tobacco: Never Used  . Alcohol Use: 12.6 oz/week    21 Glasses of wine per week    Family History Family History  Problem Relation Age of Onset  . Hypertension Mother   . Hyperlipidemia Mother   . Heart attack Mother   . Depression Mother     Bi-Polar  . Cancer Father     Lung    Surgical History Past Surgical History  Procedure Laterality Date  . Appendectomy      No Known Allergies  Current Outpatient Prescriptions  Medication Sig Dispense Refill  . amLODipine (NORVASC) 10 MG tablet     . aspirin 81 MG tablet Take 81 mg by mouth daily.    . calcium-vitamin D 250-100 MG-UNIT per tablet Take 1 tablet by  mouth daily. 600 mg daily    . escitalopram (LEXAPRO) 5 MG tablet     . Multiple Vitamin (MULTIVITAMIN) capsule Take 1 capsule by mouth daily.    Marland Kitchen amLODipine (NORVASC) 5 MG tablet Take 10 mg by mouth daily. Reported on 10/02/2015    . atorvastatin (LIPITOR) 40 MG tablet Take 40 mg by mouth daily at 6 PM. Reported on 10/02/2015    . buPROPion (WELLBUTRIN XL) 150 MG 24 hr tablet Take 150 mg by mouth daily. Reported on 10/02/2015    . Calcium Carb-Cholecalciferol 600-800 MG-UNIT TABS Take 1 tablet by mouth daily. Reported on 10/02/2015    . co-enzyme Q-10 30 MG capsule Take 400 mg by mouth 3 (three) times daily. Reported on 10/02/2015    . losartan (COZAAR) 100 MG tablet Take 100 mg by mouth daily. Reported on 10/02/2015    . megestrol (MEGACE) 40 MG tablet Take 40 mg by mouth daily. Reported on 10/02/2015    . montelukast (SINGULAIR) 10 MG tablet Take 10 mg by mouth at bedtime. Reported on 10/02/2015    . Omega-3 Fatty Acids (FISH OIL) 1000 MG CAPS Take 1 capsule by mouth daily. Reported on 10/02/2015    . valsartan (DIOVAN) 160 MG tablet Reported on 10/02/2015     No current facility-administered medications for this visit.    Review of Systems : See HPI for pertinent positives and negatives.  Physical Examination  Filed Vitals:  10/02/15 1542 10/02/15 1545  BP: 161/78 152/84  Pulse: 86   Height: 5' (1.524 m)   Weight: 81 lb (36.741 kg)   SpO2: 98%    Body mass index is 15.82 kg/(m^2).  General: A&O x 3, WD, thin female  Eyes: PERRLA  Pulmonary: Sym exp, fair air movt, CTAB, no rales, rhonchi, or wheezing  Cardiac: RRR, Nl S1, S2, no detected murmur   Vascular: Vessel Right Left  Radial Palpable Palpable  Carotid Palpable, with  bruit Palpable, with bruit  Aorta Not palpable N/A  Popliteal Not palpable Not palpable  PT Faintly Palpable Faintly Palpable  DP Palpable Palpable   Gastrointestinal: soft, NTND, -G/R, - HSM, - palpable  masses, - CVAT B   Musculoskeletal: M/S 5/5 throughout , Extremities without ischemic changes   Neurologic: CN 2-12 intact , Pain and light touch intact in extremities , Motor exam as listed above                Non-Invasive Vascular Imaging CAROTID DUPLEX 10/02/2015   Right ICA: <40% stenosis. Left ICA: 40 - 59 % stenosis. >50% stenosis of the left ECA Bilateral vertebral artery is antegrade No significant change compared to prior exam   Assessment: Crystal Noble is a 70 y.o. female who has no history of stroke or TIA, she has bilateral extracranial carotid bruits. Today's carotid duplex suggests <40% stenosis of the right ICA, left ICA with 40 - 59 % stenosis, >50% stenosis of the left ECA, and bilateral vertebral artery is antegrade. No significant change compared to prior exam.  Her atherosclerotic risk factors include active smoking. Fortunately she does not have DM.   Plan: Follow-up in 1 year with Carotid Duplex scan.  The patient was counseled re smoking cessation and given several free resources re smoking cessation.   I discussed in depth with the patient the nature of atherosclerosis, and emphasized the importance of maximal medical management including strict control of blood pressure, blood glucose, and lipid levels, obtaining regular exercise, and cessation of smoking.  The patient is aware that without maximal medical management the underlying atherosclerotic disease process will progress, limiting the benefit of any interventions. The patient was given information about stroke prevention and what symptoms should prompt the patient to seek immediate medical care. Thank you for allowing [place] to participate in this patient's care.  Clemon Chambers, RN, MSN, FNP-C [facility] Office: [phone]  Clinic Physician: Bridgett Larsson  10/02/2015 3:52 PM

## 2015-10-12 DIAGNOSIS — R5383 Other fatigue: Secondary | ICD-10-CM | POA: Diagnosis not present

## 2015-10-12 DIAGNOSIS — R945 Abnormal results of liver function studies: Secondary | ICD-10-CM | POA: Diagnosis not present

## 2015-10-12 DIAGNOSIS — R197 Diarrhea, unspecified: Secondary | ICD-10-CM | POA: Diagnosis not present

## 2015-10-12 DIAGNOSIS — R634 Abnormal weight loss: Secondary | ICD-10-CM | POA: Diagnosis not present

## 2015-10-12 DIAGNOSIS — L723 Sebaceous cyst: Secondary | ICD-10-CM | POA: Diagnosis not present

## 2015-11-02 DIAGNOSIS — R197 Diarrhea, unspecified: Secondary | ICD-10-CM | POA: Diagnosis not present

## 2015-11-02 DIAGNOSIS — R634 Abnormal weight loss: Secondary | ICD-10-CM | POA: Diagnosis not present

## 2015-11-02 DIAGNOSIS — F17200 Nicotine dependence, unspecified, uncomplicated: Secondary | ICD-10-CM | POA: Diagnosis not present

## 2015-11-02 DIAGNOSIS — I10 Essential (primary) hypertension: Secondary | ICD-10-CM | POA: Diagnosis not present

## 2015-11-06 NOTE — Addendum Note (Signed)
Addended by: Mena Goes on: 11/06/2015 01:47 PM   Modules accepted: Orders

## 2015-11-16 DIAGNOSIS — J309 Allergic rhinitis, unspecified: Secondary | ICD-10-CM | POA: Diagnosis not present

## 2015-11-16 DIAGNOSIS — H1045 Other chronic allergic conjunctivitis: Secondary | ICD-10-CM | POA: Diagnosis not present

## 2015-11-16 DIAGNOSIS — I10 Essential (primary) hypertension: Secondary | ICD-10-CM | POA: Diagnosis not present

## 2015-11-27 DIAGNOSIS — R634 Abnormal weight loss: Secondary | ICD-10-CM | POA: Diagnosis not present

## 2015-11-27 DIAGNOSIS — R197 Diarrhea, unspecified: Secondary | ICD-10-CM | POA: Diagnosis not present

## 2015-11-30 DIAGNOSIS — R634 Abnormal weight loss: Secondary | ICD-10-CM | POA: Diagnosis not present

## 2015-11-30 DIAGNOSIS — R197 Diarrhea, unspecified: Secondary | ICD-10-CM | POA: Diagnosis not present

## 2015-12-22 DIAGNOSIS — E785 Hyperlipidemia, unspecified: Secondary | ICD-10-CM | POA: Diagnosis not present

## 2015-12-22 DIAGNOSIS — R197 Diarrhea, unspecified: Secondary | ICD-10-CM | POA: Diagnosis not present

## 2015-12-22 DIAGNOSIS — Z23 Encounter for immunization: Secondary | ICD-10-CM | POA: Diagnosis not present

## 2015-12-22 DIAGNOSIS — Z0000 Encounter for general adult medical examination without abnormal findings: Secondary | ICD-10-CM | POA: Diagnosis not present

## 2015-12-22 DIAGNOSIS — I10 Essential (primary) hypertension: Secondary | ICD-10-CM | POA: Diagnosis not present

## 2016-01-01 DIAGNOSIS — R197 Diarrhea, unspecified: Secondary | ICD-10-CM | POA: Diagnosis not present

## 2016-01-18 DIAGNOSIS — D128 Benign neoplasm of rectum: Secondary | ICD-10-CM | POA: Diagnosis not present

## 2016-01-18 DIAGNOSIS — K5289 Other specified noninfective gastroenteritis and colitis: Secondary | ICD-10-CM | POA: Diagnosis not present

## 2016-01-18 DIAGNOSIS — K635 Polyp of colon: Secondary | ICD-10-CM | POA: Diagnosis not present

## 2016-01-18 DIAGNOSIS — Z1211 Encounter for screening for malignant neoplasm of colon: Secondary | ICD-10-CM | POA: Diagnosis not present

## 2016-01-28 DIAGNOSIS — K52831 Collagenous colitis: Secondary | ICD-10-CM | POA: Diagnosis not present

## 2016-02-08 ENCOUNTER — Other Ambulatory Visit: Payer: Self-pay

## 2016-02-29 DIAGNOSIS — Z23 Encounter for immunization: Secondary | ICD-10-CM | POA: Diagnosis not present

## 2016-06-27 DIAGNOSIS — Z681 Body mass index (BMI) 19 or less, adult: Secondary | ICD-10-CM | POA: Diagnosis not present

## 2016-06-27 DIAGNOSIS — R5383 Other fatigue: Secondary | ICD-10-CM | POA: Diagnosis not present

## 2016-06-27 DIAGNOSIS — L659 Nonscarring hair loss, unspecified: Secondary | ICD-10-CM | POA: Diagnosis not present

## 2016-06-27 DIAGNOSIS — Z23 Encounter for immunization: Secondary | ICD-10-CM | POA: Diagnosis not present

## 2016-06-27 DIAGNOSIS — I10 Essential (primary) hypertension: Secondary | ICD-10-CM | POA: Diagnosis not present

## 2016-07-18 ENCOUNTER — Other Ambulatory Visit: Payer: Self-pay | Admitting: Family Medicine

## 2016-07-18 DIAGNOSIS — Z1231 Encounter for screening mammogram for malignant neoplasm of breast: Secondary | ICD-10-CM

## 2016-07-25 ENCOUNTER — Ambulatory Visit
Admission: RE | Admit: 2016-07-25 | Discharge: 2016-07-25 | Disposition: A | Discharge status: Home / Self Care | Payer: Medicare Other | Source: Ambulatory Visit | Attending: Family Medicine | Admitting: Family Medicine

## 2016-07-25 DIAGNOSIS — Z1231 Encounter for screening mammogram for malignant neoplasm of breast: Secondary | ICD-10-CM | POA: Diagnosis not present

## 2016-08-08 DIAGNOSIS — Z681 Body mass index (BMI) 19 or less, adult: Secondary | ICD-10-CM | POA: Diagnosis not present

## 2016-08-08 DIAGNOSIS — I10 Essential (primary) hypertension: Secondary | ICD-10-CM | POA: Diagnosis not present

## 2016-08-08 DIAGNOSIS — R634 Abnormal weight loss: Secondary | ICD-10-CM | POA: Diagnosis not present

## 2016-08-08 DIAGNOSIS — K52831 Collagenous colitis: Secondary | ICD-10-CM | POA: Diagnosis not present

## 2016-09-30 DIAGNOSIS — K52831 Collagenous colitis: Secondary | ICD-10-CM | POA: Diagnosis not present

## 2016-10-03 DIAGNOSIS — R197 Diarrhea, unspecified: Secondary | ICD-10-CM | POA: Diagnosis not present

## 2016-10-03 DIAGNOSIS — F411 Generalized anxiety disorder: Secondary | ICD-10-CM | POA: Diagnosis not present

## 2016-10-03 DIAGNOSIS — R634 Abnormal weight loss: Secondary | ICD-10-CM | POA: Diagnosis not present

## 2016-10-03 DIAGNOSIS — I10 Essential (primary) hypertension: Secondary | ICD-10-CM | POA: Diagnosis not present

## 2016-10-07 ENCOUNTER — Ambulatory Visit: Payer: Medicare Other | Admitting: Family

## 2016-10-07 ENCOUNTER — Encounter (HOSPITAL_COMMUNITY): Payer: Medicare Other

## 2016-10-25 ENCOUNTER — Encounter: Payer: Self-pay | Admitting: Family

## 2016-10-26 DIAGNOSIS — K52831 Collagenous colitis: Secondary | ICD-10-CM | POA: Diagnosis not present

## 2016-11-02 ENCOUNTER — Ambulatory Visit (INDEPENDENT_AMBULATORY_CARE_PROVIDER_SITE_OTHER): Payer: Medicare Other | Admitting: Family

## 2016-11-02 ENCOUNTER — Ambulatory Visit (HOSPITAL_COMMUNITY)
Admission: RE | Admit: 2016-11-02 | Discharge: 2016-11-02 | Disposition: A | Discharge status: Home / Self Care | Payer: Medicare Other | Source: Ambulatory Visit | Attending: Family | Admitting: Family

## 2016-11-02 ENCOUNTER — Encounter: Payer: Self-pay | Admitting: Family

## 2016-11-02 VITALS — BP 156/83 | HR 100 | Temp 98.6°F | Resp 20 | Ht 60.0 in | Wt 80.0 lb

## 2016-11-02 DIAGNOSIS — F17200 Nicotine dependence, unspecified, uncomplicated: Secondary | ICD-10-CM | POA: Diagnosis not present

## 2016-11-02 DIAGNOSIS — I6523 Occlusion and stenosis of bilateral carotid arteries: Secondary | ICD-10-CM | POA: Diagnosis not present

## 2016-11-02 NOTE — Patient Instructions (Signed)
Stroke Prevention Some medical conditions and behaviors are associated with an increased chance of having a stroke. You may prevent a stroke by making healthy choices and managing medical conditions. How can I reduce my risk of having a stroke?  Stay physically active. Get at least 30 minutes of activity on most or all days.  Do not smoke. It may also be helpful to avoid exposure to secondhand smoke.  Limit alcohol use. Moderate alcohol use is considered to be:  No more than 2 drinks per day for men.  No more than 1 drink per day for nonpregnant women.  Eat healthy foods. This involves:  Eating 5 or more servings of fruits and vegetables a day.  Making dietary changes that address high blood pressure (hypertension), high cholesterol, diabetes, or obesity.  Manage your cholesterol levels.  Making food choices that are high in fiber and low in saturated fat, trans fat, and cholesterol may control cholesterol levels.  Take any prescribed medicines to control cholesterol as directed by your health care provider.  Manage your diabetes.  Controlling your carbohydrate and sugar intake is recommended to manage diabetes.  Take any prescribed medicines to control diabetes as directed by your health care provider.  Control your hypertension.  Making food choices that are low in salt (sodium), saturated fat, trans fat, and cholesterol is recommended to manage hypertension.  Ask your health care provider if you need treatment to lower your blood pressure. Take any prescribed medicines to control hypertension as directed by your health care provider.  If you are 18-39 years of age, have your blood pressure checked every 3-5 years. If you are 40 years of age or older, have your blood pressure checked every year.  Maintain a healthy weight.  Reducing calorie intake and making food choices that are low in sodium, saturated fat, trans fat, and cholesterol are recommended to manage  weight.  Stop drug abuse.  Avoid taking birth control pills.  Talk to your health care provider about the risks of taking birth control pills if you are over 35 years old, smoke, get migraines, or have ever had a blood clot.  Get evaluated for sleep disorders (sleep apnea).  Talk to your health care provider about getting a sleep evaluation if you snore a lot or have excessive sleepiness.  Take medicines only as directed by your health care provider.  For some people, aspirin or blood thinners (anticoagulants) are helpful in reducing the risk of forming abnormal blood clots that can lead to stroke. If you have the irregular heart rhythm of atrial fibrillation, you should be on a blood thinner unless there is a good reason you cannot take them.  Understand all your medicine instructions.  Make sure that other conditions (such as anemia or atherosclerosis) are addressed. Get help right away if:  You have sudden weakness or numbness of the face, arm, or leg, especially on one side of the body.  Your face or eyelid droops to one side.  You have sudden confusion.  You have trouble speaking (aphasia) or understanding.  You have sudden trouble seeing in one or both eyes.  You have sudden trouble walking.  You have dizziness.  You have a loss of balance or coordination.  You have a sudden, severe headache with no known cause.  You have new chest pain or an irregular heartbeat. Any of these symptoms may represent a serious problem that is an emergency. Do not wait to see if the symptoms will go away.   Get medical help at once. Call your local emergency services (911 in [place] Do not drive yourself to the hospital.  This information is not intended to replace advice given to you by your health care provider. Make sure you discuss any questions you have with your health care provider. Document Released: 07/07/2004 Document Revised: 11/05/2015 Document Reviewed: 11/30/2012 Elsevier  Interactive Patient Education  2017 Elsevier Inc.     Steps to Quit Smoking Smoking tobacco can be bad for your health. It can also affect almost every organ in your body. Smoking puts you and people around you at risk for many serious long-lasting (chronic) diseases. Quitting smoking is hard, but it is one of the best things that you can do for your health. It is never too late to quit. What are the benefits of quitting smoking? When you quit smoking, you lower your risk for getting serious diseases and conditions. They can include:  Lung cancer or lung disease.  Heart disease.  Stroke.  Heart attack.  Not being able to have children (infertility).  Weak bones (osteoporosis) and broken bones (fractures). If you have coughing, wheezing, and shortness of breath, those symptoms may get better when you quit. You may also get sick less often. If you are pregnant, quitting smoking can help to lower your chances of having a baby of low birth weight. What can I do to help me quit smoking? Talk with your doctor about what can help you quit smoking. Some things you can do (strategies) include:  Quitting smoking totally, instead of slowly cutting back how much you smoke over a period of time.  Going to in-person counseling. You are more likely to quit if you go to many counseling sessions.  Using resources and support systems, such as:  Online chats with a counselor.  Phone quitlines.  Printed self-help materials.  Support groups or group counseling.  Text messaging programs.  Mobile phone apps or applications.  Taking medicines. Some of these medicines may have nicotine in them. If you are pregnant or breastfeeding, do not take any medicines to quit smoking unless your doctor says it is okay. Talk with your doctor about counseling or other things that can help you. Talk with your doctor about using more than one strategy at the same time, such as taking medicines while you are  also going to in-person counseling. This can help make quitting easier. What things can I do to make it easier to quit? Quitting smoking might feel very hard at first, but there is a lot that you can do to make it easier. Take these steps:  Talk to your family and friends. Ask them to support and encourage you.  Call phone quitlines, reach out to support groups, or work with a counselor.  Ask people who smoke to not smoke around you.  Avoid places that make you want (trigger) to smoke, such as:  Bars.  Parties.  Smoke-break areas at work.  Spend time with people who do not smoke.  Lower the stress in your life. Stress can make you want to smoke. Try these things to help your stress:  Getting regular exercise.  Deep-breathing exercises.  Yoga.  Meditating.  Doing a body scan. To do this, close your eyes, focus on one area of your body at a time from head to toe, and notice which parts of your body are tense. Try to relax the muscles in those areas.  Download or buy apps on your mobile phone or tablet   that can help you stick to your quit plan. There are many free apps, such as QuitGuide from the CDC (Centers for Disease Control and Prevention). You can find more support from smokefree.gov and other websites. This information is not intended to replace advice given to you by your health care provider. Make sure you discuss any questions you have with your health care provider. Document Released: 03/26/2009 Document Revised: 01/26/2016 Document Reviewed: 10/14/2014 Elsevier Interactive Patient Education  2017 Elsevier Inc.  

## 2016-11-02 NOTE — Progress Notes (Signed)
Chief Complaint: Follow up Extracranial Carotid Artery Stenosis   History of Present Illness  Crystal Noble is a 71 y.o. female patient of Dr. Bridgett Larsson who presents with chief complaint: routine follow-up. Previous carotid studies demonstrated: RICA <22% stenosis, LICA 02-54% stenosis with calcification. Pt did not want CTA neck to evluate the L ICA stenosis. Patient has no history of TIA or stroke symptom. The patient has never had amaurosis fugax or monocular blindness. The patient has never had facial drooping or hemiplegia. The patient has never had receptive or expressive aphasia.  She has not had previous carotid artery intervention.   On ROS today: no intermittent claudication, no post prandial abdominal pain, states her appetite has improved on the corticosteroid.   She has been diagnosed with collagenous colitis, is taking a corticosteroid which is helping.   Pt Diabetic: no Pt smoker: smoker (1/2 ppd, started at age 68 yrs)  Pt meds include: Statin : no, she tried a statin and felt very anxious ASA: yes Other anticoagulants/antiplatelets: no    Past Medical History:  Diagnosis Date  . Carotid stenosis, asymptomatic   . Colitis, collagenous   . Hypertension     Social History Social History  Substance Use Topics  . Smoking status: Current Every Day Smoker    Packs/day: 0.50    Years: 30.00    Types: Cigarettes  . Smokeless tobacco: Never Used  . Alcohol use 12.6 oz/week    21 Glasses of wine per week    Family History Family History  Problem Relation Age of Onset  . Hypertension Mother   . Hyperlipidemia Mother   . Heart attack Mother   . Depression Mother        Bi-Polar  . Cancer Father        Lung    Surgical History Past Surgical History:  Procedure Laterality Date  . APPENDECTOMY      Allergies  Allergen Reactions  . Penicillin G Rash    Current Outpatient Prescriptions  Medication Sig Dispense Refill  . amLODipine  (NORVASC) 10 MG tablet     . aspirin 81 MG tablet Take 81 mg by mouth daily.    . budesonide (ENTOCORT EC) 3 MG 24 hr capsule Take 9 mg by mouth daily.  0  . Calcium Carb-Cholecalciferol 600-800 MG-UNIT TABS Take 1 tablet by mouth daily. Reported on 10/02/2015    . escitalopram (LEXAPRO) 5 MG tablet     . hydrochlorothiazide (HYDRODIURIL) 12.5 MG tablet TAKE 1 TABLET (12.5 MG) BY MOUTH ONE TIME  3  . Multiple Vitamin (MULTIVITAMIN) capsule Take 1 capsule by mouth daily.    . valACYclovir (VALTREX) 1000 MG tablet Take by mouth.    Marland Kitchen amLODipine (NORVASC) 5 MG tablet Take 10 mg by mouth daily. Reported on 10/02/2015    . atorvastatin (LIPITOR) 40 MG tablet Take 40 mg by mouth daily at 6 PM. Reported on 10/02/2015    . buPROPion (WELLBUTRIN XL) 150 MG 24 hr tablet Take 150 mg by mouth daily. Reported on 10/02/2015    . calcium-vitamin D 250-100 MG-UNIT per tablet Take 1 tablet by mouth daily. 600 mg daily    . co-enzyme Q-10 30 MG capsule Take 400 mg by mouth 3 (three) times daily. Reported on 10/02/2015    . losartan (COZAAR) 100 MG tablet Take 100 mg by mouth daily. Reported on 10/02/2015    . megestrol (MEGACE) 40 MG tablet Take 40 mg by mouth daily. Reported on 10/02/2015    .  montelukast (SINGULAIR) 10 MG tablet Take 10 mg by mouth at bedtime. Reported on 10/02/2015    . Omega-3 Fatty Acids (FISH OIL) 1000 MG CAPS Take 1 capsule by mouth daily. Reported on 10/02/2015    . valsartan (DIOVAN) 160 MG tablet Reported on 10/02/2015     No current facility-administered medications for this visit.     Review of Systems : See HPI for pertinent positives and negatives.  Physical Examination  Vitals:   11/02/16 1349 11/02/16 1352  BP: (!) 152/80 (!) 156/83  Pulse: 100   Resp: 20   Temp: 98.6 F (37 C)   TempSrc: Oral   SpO2: 96%   Weight: 80 lb (36.3 kg)   Height: 5' (1.524 m)    Body mass index is 15.62 kg/m.  General: A&O x 3, WD, thin female  Eyes: PERRLA  Pulmonary: Sym  exp,respirations are non labored,  fair air movt, CTAB, no rales, rhonchi, or wheezing  Cardiac: RRR, Nl S1, S2, no detected murmur   Vascular: Vessel Right Left  Radial Palpable Palpable  Carotid Palpable, with  bruit Palpable, with bruit  Aorta Not palpable N/A  Popliteal Not palpable Not palpable  PT Faintly Palpable Faintly Palpable  DP Palpable Palpable   Gastrointestinal: soft, NTND, -G/R, - HSM, - palpable masses, - CVAT B   Musculoskeletal: M/S 5/5 throughout , Extremities without ischemic changes   Neurologic: CN 2-12 intact , Pain and light touch intact in extremities , Motor exam as listed above     Assessment: Crystal Noble is a 71 y.o. female who has no history of stroke or TIA, she has bilateral carotid bruits.  Her atherosclerotic risk factors include active smoking. Fortunately she does not have DM.   DATA Today's carotid duplex suggests <40% stenosis of the right ICA, left ICA with 40 - 59 % stenosis, >50% stenosis of the left ECA. Bilateral vertebral artery flow is antegrade.  Bilateral subclavian artery waveforms are normal.  No significant change compared to the last exam on 10-02-15.    Plan: Follow-up in 1 year with Carotid Duplex scan.  The patient was counseled re smoking cessation and given several free resources re smoking cessation.    I discussed in depth with the patient the nature of atherosclerosis, and emphasized the importance of maximal medical management including strict control of blood pressure, blood glucose, and lipid levels, obtaining regular exercise, and cessation of smoking.  The patient is aware that without maximal medical management the underlying atherosclerotic disease process will progress, limiting the benefit of any interventions. The patient was given information about stroke prevention and what symptoms should prompt the patient to seek immediate medical care. Thank  you for allowing [place] to participate in this patient's care.  Clemon Chambers, RN, MSN, FNP-C [facility] Office: [phone]  Clinic Physician: Scot Dock  11/02/16 1:54 PM

## 2016-12-27 DIAGNOSIS — K52831 Collagenous colitis: Secondary | ICD-10-CM | POA: Diagnosis not present

## 2016-12-27 DIAGNOSIS — Z0000 Encounter for general adult medical examination without abnormal findings: Secondary | ICD-10-CM | POA: Diagnosis not present

## 2016-12-27 DIAGNOSIS — Z681 Body mass index (BMI) 19 or less, adult: Secondary | ICD-10-CM | POA: Diagnosis not present

## 2016-12-27 DIAGNOSIS — F411 Generalized anxiety disorder: Secondary | ICD-10-CM | POA: Diagnosis not present

## 2016-12-27 DIAGNOSIS — I10 Essential (primary) hypertension: Secondary | ICD-10-CM | POA: Diagnosis not present

## 2016-12-28 DIAGNOSIS — F17200 Nicotine dependence, unspecified, uncomplicated: Secondary | ICD-10-CM | POA: Diagnosis not present

## 2016-12-28 DIAGNOSIS — K52831 Collagenous colitis: Secondary | ICD-10-CM | POA: Diagnosis not present

## 2017-01-24 DIAGNOSIS — F411 Generalized anxiety disorder: Secondary | ICD-10-CM | POA: Diagnosis not present

## 2017-01-24 DIAGNOSIS — K52831 Collagenous colitis: Secondary | ICD-10-CM | POA: Diagnosis not present

## 2017-01-24 DIAGNOSIS — I10 Essential (primary) hypertension: Secondary | ICD-10-CM | POA: Diagnosis not present

## 2017-01-24 DIAGNOSIS — R634 Abnormal weight loss: Secondary | ICD-10-CM | POA: Diagnosis not present

## 2017-03-07 DIAGNOSIS — Z23 Encounter for immunization: Secondary | ICD-10-CM | POA: Diagnosis not present

## 2017-03-07 DIAGNOSIS — F411 Generalized anxiety disorder: Secondary | ICD-10-CM | POA: Diagnosis not present

## 2017-03-07 DIAGNOSIS — R634 Abnormal weight loss: Secondary | ICD-10-CM | POA: Diagnosis not present

## 2017-03-07 DIAGNOSIS — Z681 Body mass index (BMI) 19 or less, adult: Secondary | ICD-10-CM | POA: Diagnosis not present

## 2017-03-07 DIAGNOSIS — K52831 Collagenous colitis: Secondary | ICD-10-CM | POA: Diagnosis not present

## 2017-03-14 DIAGNOSIS — H01001 Unspecified blepharitis right upper eyelid: Secondary | ICD-10-CM | POA: Diagnosis not present

## 2017-03-14 DIAGNOSIS — H01004 Unspecified blepharitis left upper eyelid: Secondary | ICD-10-CM | POA: Diagnosis not present

## 2017-03-14 DIAGNOSIS — H524 Presbyopia: Secondary | ICD-10-CM | POA: Diagnosis not present

## 2017-03-14 DIAGNOSIS — H2513 Age-related nuclear cataract, bilateral: Secondary | ICD-10-CM | POA: Diagnosis not present

## 2017-03-28 DIAGNOSIS — Z681 Body mass index (BMI) 19 or less, adult: Secondary | ICD-10-CM | POA: Diagnosis not present

## 2017-03-28 DIAGNOSIS — I10 Essential (primary) hypertension: Secondary | ICD-10-CM | POA: Diagnosis not present

## 2017-03-28 DIAGNOSIS — K529 Noninfective gastroenteritis and colitis, unspecified: Secondary | ICD-10-CM | POA: Diagnosis not present

## 2017-03-28 DIAGNOSIS — F411 Generalized anxiety disorder: Secondary | ICD-10-CM | POA: Diagnosis not present

## 2017-04-12 DIAGNOSIS — M21242 Flexion deformity, left finger joints: Secondary | ICD-10-CM | POA: Diagnosis not present

## 2017-04-12 DIAGNOSIS — M720 Palmar fascial fibromatosis [Dupuytren]: Secondary | ICD-10-CM | POA: Diagnosis not present

## 2017-04-18 DIAGNOSIS — F411 Generalized anxiety disorder: Secondary | ICD-10-CM | POA: Diagnosis not present

## 2017-04-18 DIAGNOSIS — R197 Diarrhea, unspecified: Secondary | ICD-10-CM | POA: Diagnosis not present

## 2017-04-18 DIAGNOSIS — R634 Abnormal weight loss: Secondary | ICD-10-CM | POA: Diagnosis not present

## 2017-04-18 DIAGNOSIS — I10 Essential (primary) hypertension: Secondary | ICD-10-CM | POA: Diagnosis not present

## 2017-05-11 DIAGNOSIS — R110 Nausea: Secondary | ICD-10-CM | POA: Diagnosis not present

## 2017-05-11 DIAGNOSIS — F411 Generalized anxiety disorder: Secondary | ICD-10-CM | POA: Diagnosis not present

## 2017-05-11 DIAGNOSIS — R630 Anorexia: Secondary | ICD-10-CM | POA: Diagnosis not present

## 2017-05-11 DIAGNOSIS — R634 Abnormal weight loss: Secondary | ICD-10-CM | POA: Diagnosis not present

## 2017-05-15 DIAGNOSIS — R110 Nausea: Secondary | ICD-10-CM | POA: Diagnosis not present

## 2017-05-15 DIAGNOSIS — R630 Anorexia: Secondary | ICD-10-CM | POA: Diagnosis not present

## 2017-05-15 DIAGNOSIS — F411 Generalized anxiety disorder: Secondary | ICD-10-CM | POA: Diagnosis not present

## 2017-05-15 DIAGNOSIS — E860 Dehydration: Secondary | ICD-10-CM | POA: Diagnosis not present

## 2017-05-29 DIAGNOSIS — K52831 Collagenous colitis: Secondary | ICD-10-CM | POA: Diagnosis not present

## 2017-05-29 DIAGNOSIS — F17200 Nicotine dependence, unspecified, uncomplicated: Secondary | ICD-10-CM | POA: Diagnosis not present

## 2017-06-02 DIAGNOSIS — I10 Essential (primary) hypertension: Secondary | ICD-10-CM | POA: Diagnosis not present

## 2017-06-02 DIAGNOSIS — Z681 Body mass index (BMI) 19 or less, adult: Secondary | ICD-10-CM | POA: Diagnosis not present

## 2017-06-02 DIAGNOSIS — R634 Abnormal weight loss: Secondary | ICD-10-CM | POA: Diagnosis not present

## 2017-06-02 DIAGNOSIS — F411 Generalized anxiety disorder: Secondary | ICD-10-CM | POA: Diagnosis not present

## 2017-07-14 DIAGNOSIS — R634 Abnormal weight loss: Secondary | ICD-10-CM | POA: Diagnosis not present

## 2017-07-14 DIAGNOSIS — R64 Cachexia: Secondary | ICD-10-CM | POA: Diagnosis not present

## 2017-07-14 DIAGNOSIS — F411 Generalized anxiety disorder: Secondary | ICD-10-CM | POA: Diagnosis not present

## 2017-07-14 DIAGNOSIS — R5383 Other fatigue: Secondary | ICD-10-CM | POA: Diagnosis not present

## 2017-07-14 DIAGNOSIS — L659 Nonscarring hair loss, unspecified: Secondary | ICD-10-CM | POA: Diagnosis not present

## 2017-07-14 DIAGNOSIS — Z681 Body mass index (BMI) 19 or less, adult: Secondary | ICD-10-CM | POA: Diagnosis not present

## 2017-07-14 DIAGNOSIS — E559 Vitamin D deficiency, unspecified: Secondary | ICD-10-CM | POA: Diagnosis not present

## 2017-07-14 DIAGNOSIS — I10 Essential (primary) hypertension: Secondary | ICD-10-CM | POA: Diagnosis not present

## 2017-07-25 DIAGNOSIS — R197 Diarrhea, unspecified: Secondary | ICD-10-CM | POA: Diagnosis not present

## 2017-08-17 DIAGNOSIS — M79645 Pain in left finger(s): Secondary | ICD-10-CM | POA: Diagnosis not present

## 2017-08-17 DIAGNOSIS — G8918 Other acute postprocedural pain: Secondary | ICD-10-CM | POA: Diagnosis not present

## 2017-08-17 DIAGNOSIS — M720 Palmar fascial fibromatosis [Dupuytren]: Secondary | ICD-10-CM | POA: Diagnosis not present

## 2017-08-21 DIAGNOSIS — F411 Generalized anxiety disorder: Secondary | ICD-10-CM | POA: Diagnosis not present

## 2017-08-21 DIAGNOSIS — L659 Nonscarring hair loss, unspecified: Secondary | ICD-10-CM | POA: Diagnosis not present

## 2017-08-21 DIAGNOSIS — Z681 Body mass index (BMI) 19 or less, adult: Secondary | ICD-10-CM | POA: Diagnosis not present

## 2017-08-21 DIAGNOSIS — I10 Essential (primary) hypertension: Secondary | ICD-10-CM | POA: Diagnosis not present

## 2017-08-30 DIAGNOSIS — M79642 Pain in left hand: Secondary | ICD-10-CM | POA: Diagnosis not present

## 2017-09-14 DIAGNOSIS — M7631 Iliotibial band syndrome, right leg: Secondary | ICD-10-CM | POA: Diagnosis not present

## 2017-09-14 DIAGNOSIS — M5416 Radiculopathy, lumbar region: Secondary | ICD-10-CM | POA: Diagnosis not present

## 2017-09-14 DIAGNOSIS — Z681 Body mass index (BMI) 19 or less, adult: Secondary | ICD-10-CM | POA: Diagnosis not present

## 2017-09-15 DIAGNOSIS — M79642 Pain in left hand: Secondary | ICD-10-CM | POA: Diagnosis not present

## 2017-09-18 DIAGNOSIS — Z681 Body mass index (BMI) 19 or less, adult: Secondary | ICD-10-CM | POA: Diagnosis not present

## 2017-09-18 DIAGNOSIS — M5416 Radiculopathy, lumbar region: Secondary | ICD-10-CM | POA: Diagnosis not present

## 2017-09-25 ENCOUNTER — Ambulatory Visit
Admission: RE | Admit: 2017-09-25 | Discharge: 2017-09-25 | Disposition: A | Discharge status: Home / Self Care | Payer: Medicare Other | Source: Ambulatory Visit | Attending: Family Medicine | Admitting: Family Medicine

## 2017-09-25 ENCOUNTER — Other Ambulatory Visit: Payer: Self-pay | Admitting: Family Medicine

## 2017-09-25 DIAGNOSIS — M1611 Unilateral primary osteoarthritis, right hip: Secondary | ICD-10-CM | POA: Diagnosis not present

## 2017-09-25 DIAGNOSIS — M5416 Radiculopathy, lumbar region: Secondary | ICD-10-CM

## 2017-09-25 DIAGNOSIS — M5136 Other intervertebral disc degeneration, lumbar region: Secondary | ICD-10-CM | POA: Diagnosis not present

## 2017-09-25 IMAGING — CR DG HIP (WITH OR WITHOUT PELVIS) 2-3V*R*
2 series · 2 of 2 positions shown · non-contrast
Comparison: None.

CLINICAL DATA: 71-year-old female with right lateral trochanteric
hip pain extending to knee for the past 2 weeks. No trauma. Initial
encounter.

EXAM:
DG HIP (WITH OR WITHOUT PELVIS) 2-3V RIGHT

[w hip ap right]
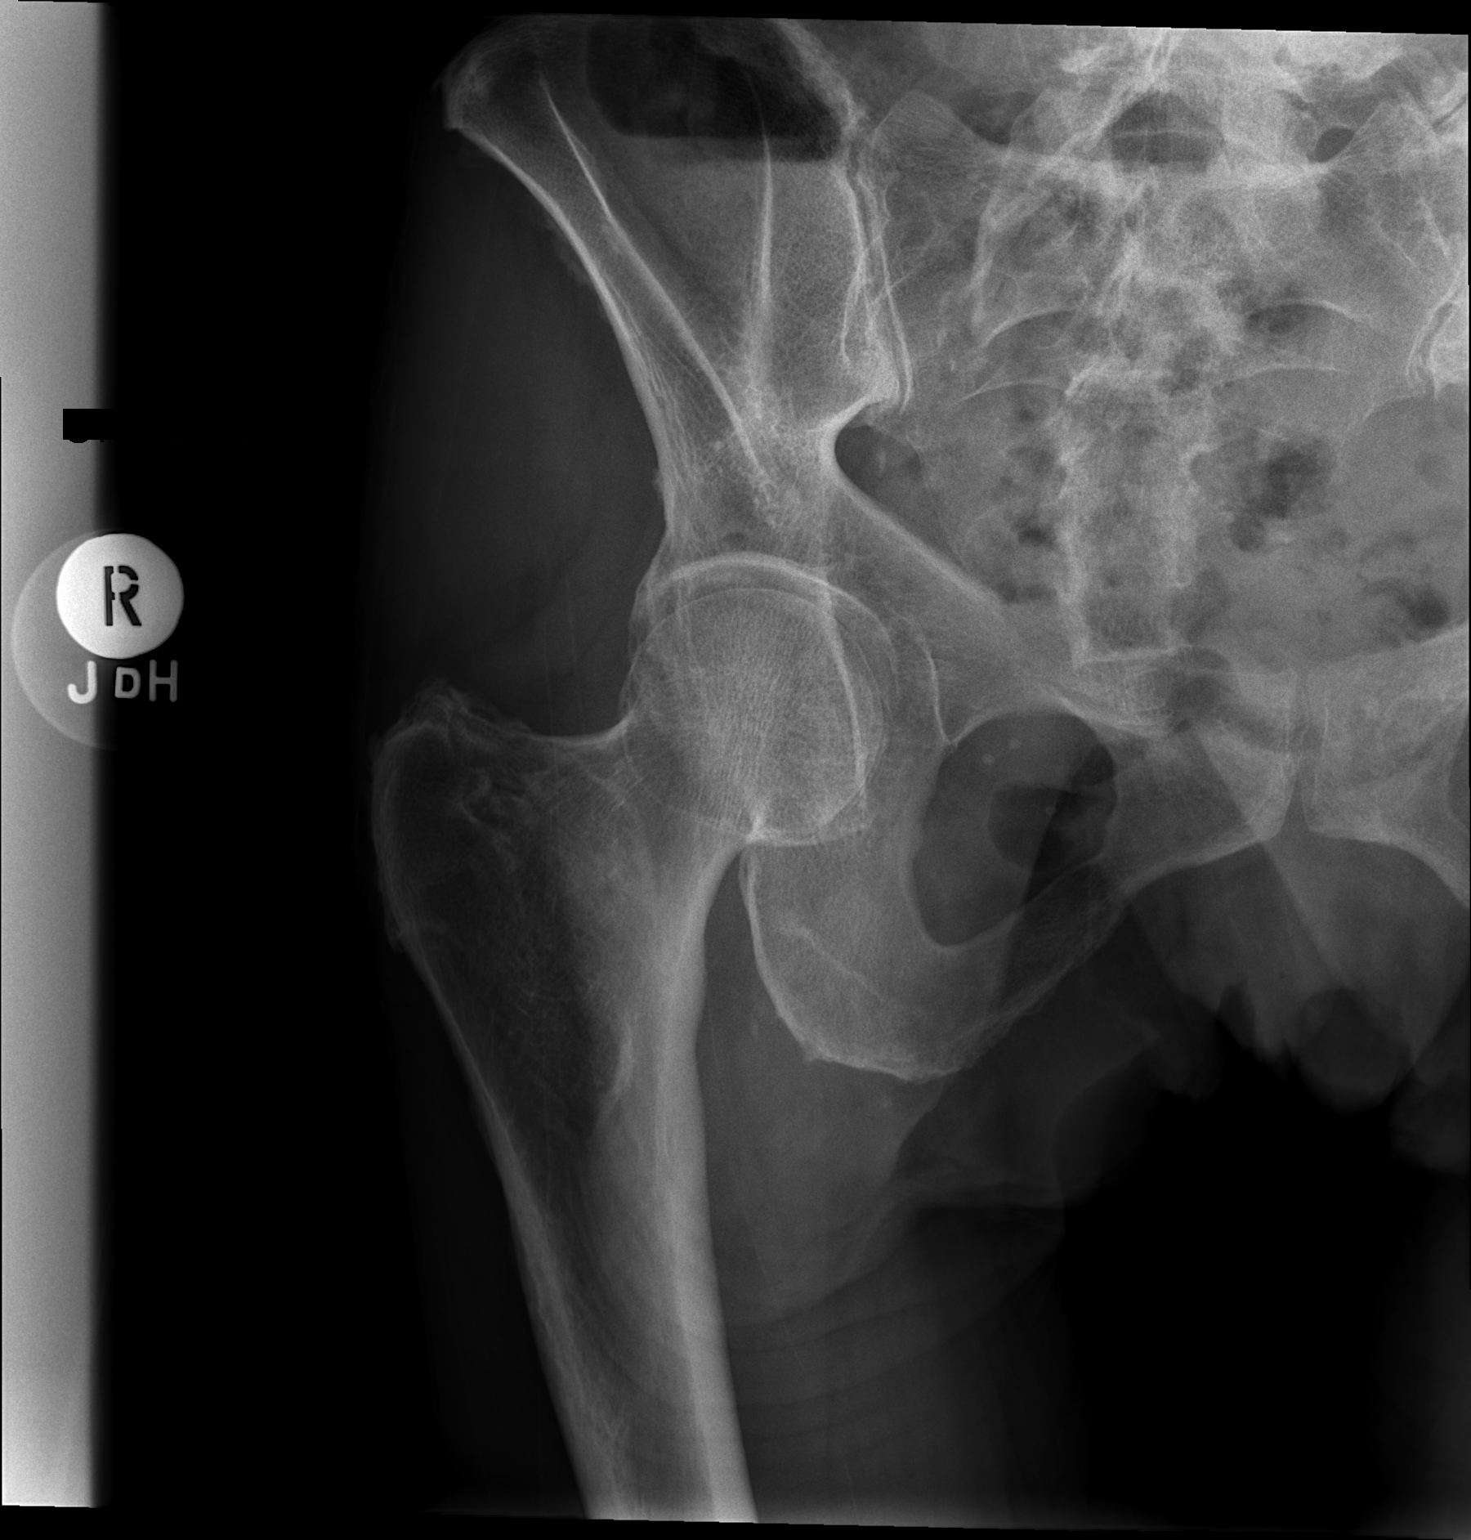

[w hip frog right]
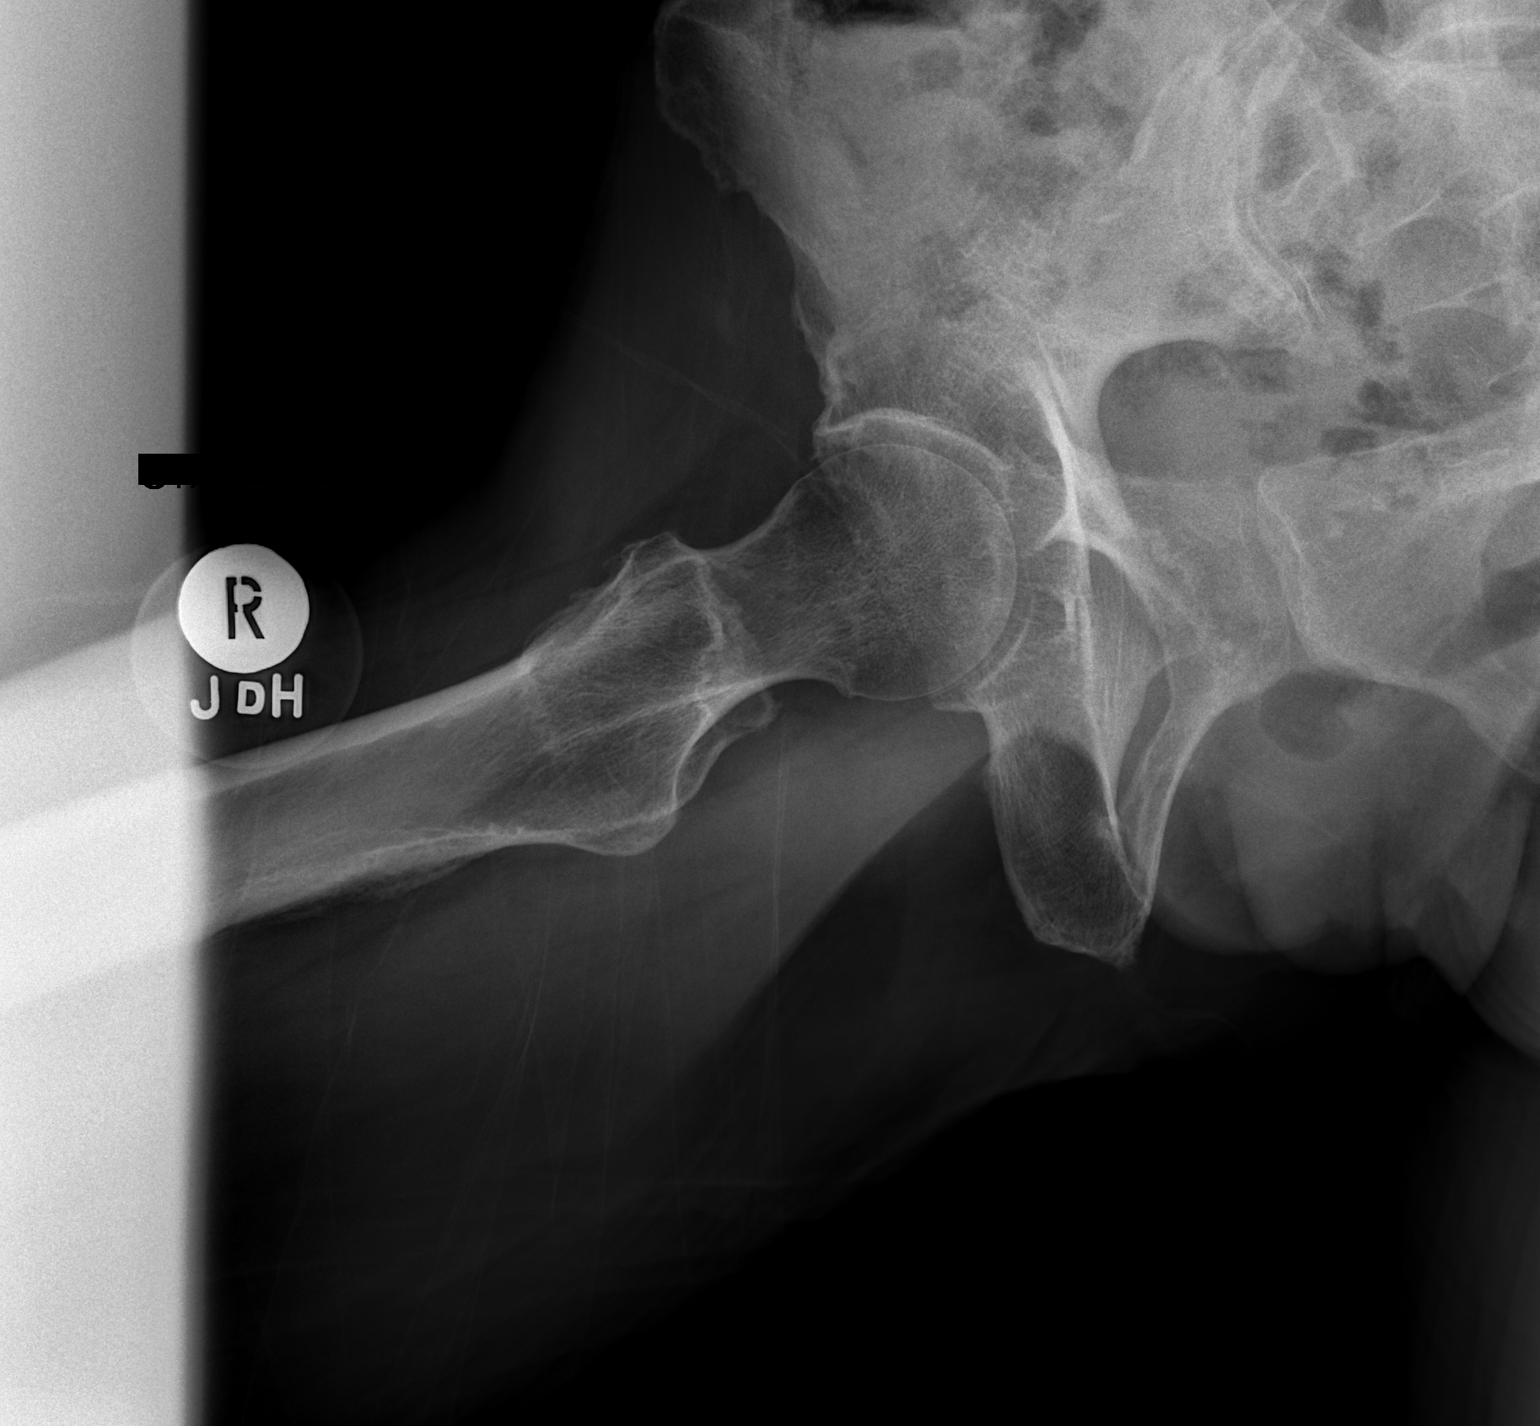

[2 of 2 positions shown; findings below may reference images not displayed]

FINDINGS: No fracture or dislocation.

Slight spurring at the greater trochanter level.

No plain film evidence of femoral head avascular necrosis.
IMPRESSION: Slight spurring right greater trochanter region may indicate mild
degenerative changes.

## 2017-09-25 IMAGING — CR DG LUMBAR SPINE COMPLETE 4+V
5 series · 5 of 5 positions shown · non-contrast
Comparison: None.

CLINICAL DATA: 71-year-old female with a history of right hip pain

EXAM:
LUMBAR SPINE - COMPLETE 4+ VIEW

[w lumbar spine ap]
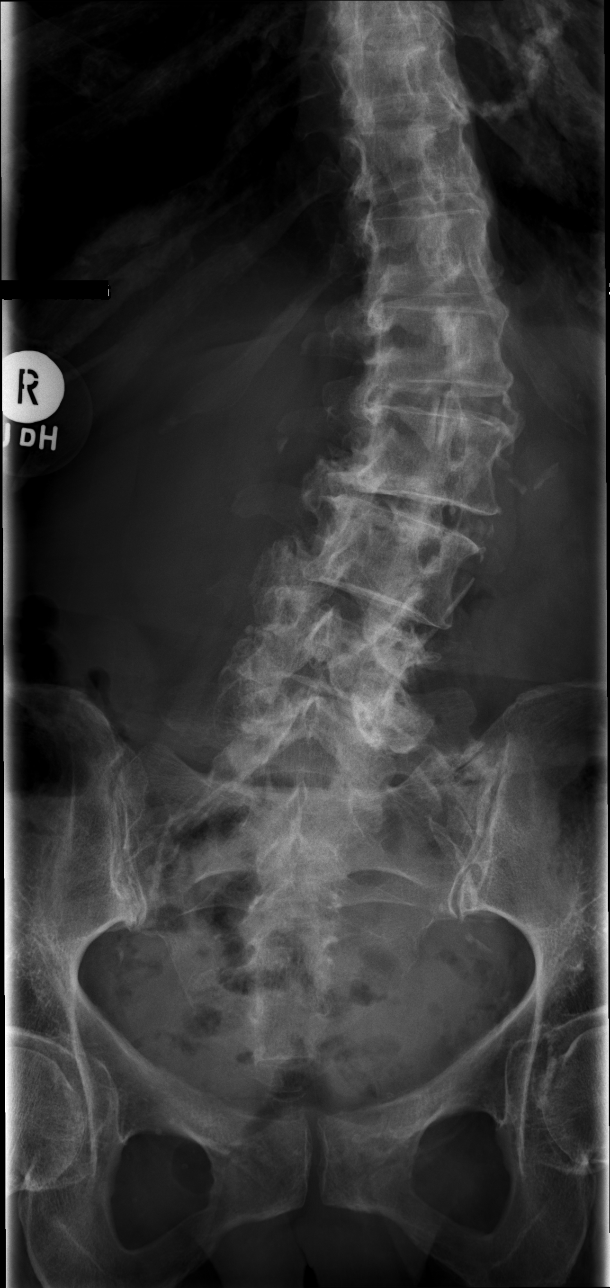

[w lumbar spine obl (1 of 2)]
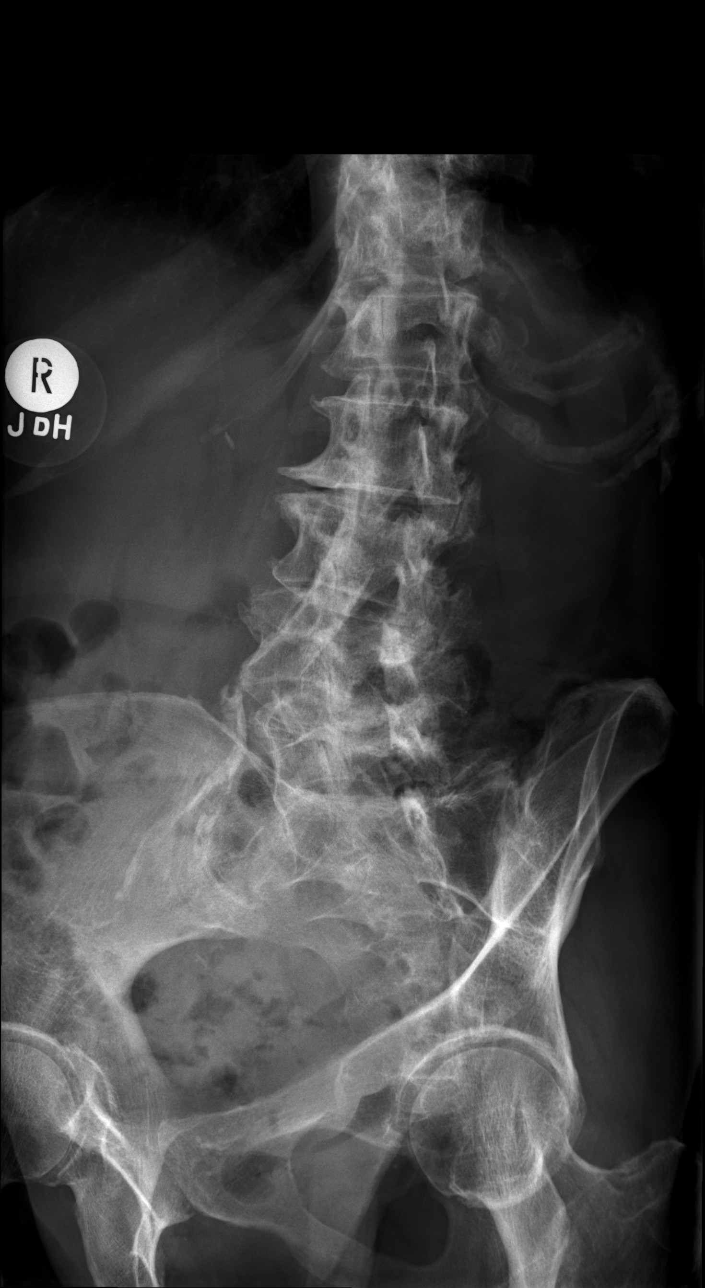

[w lumbar spine obl (2 of 2)]
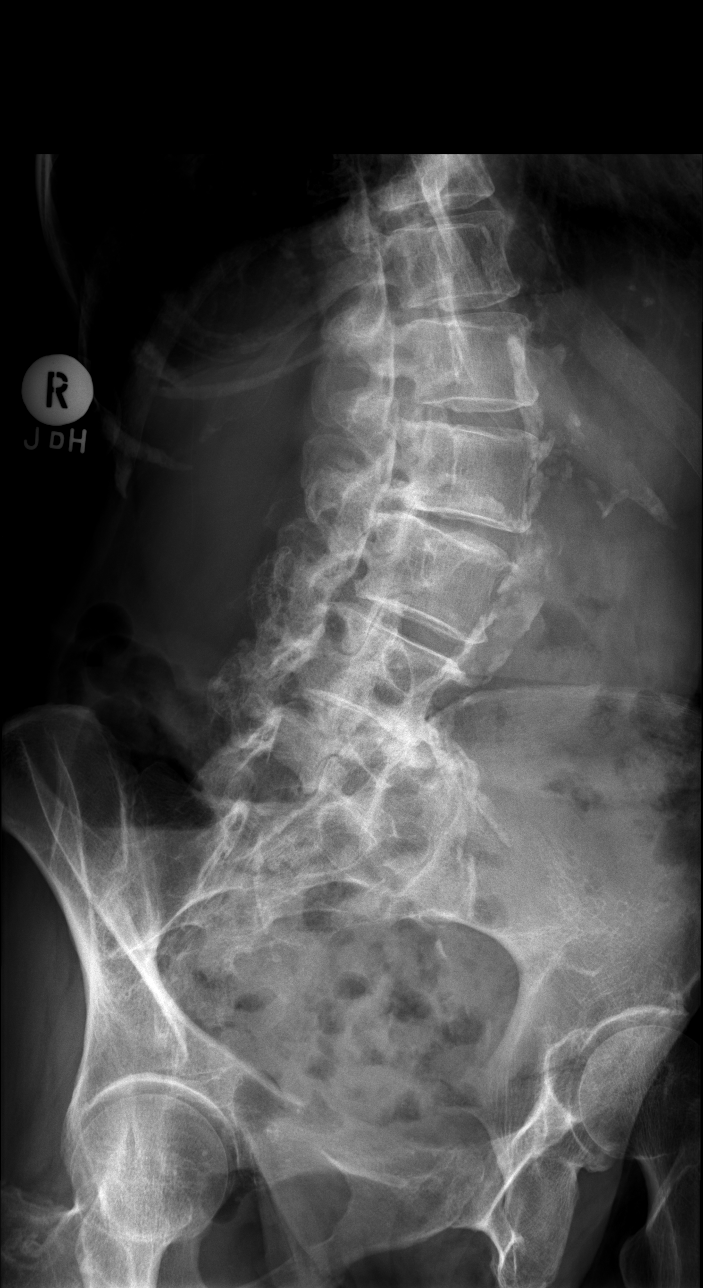

[w lumbar spine lat]
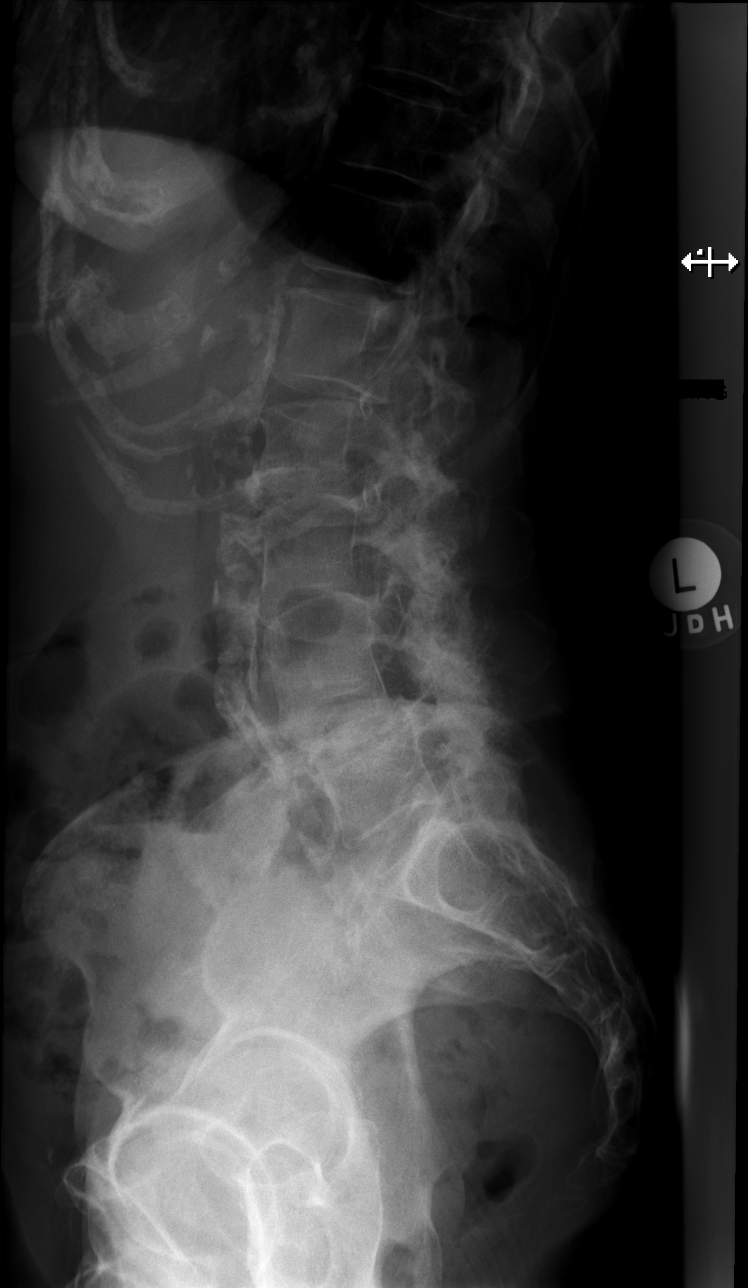

[w lumbar l-5 s-1 spot]
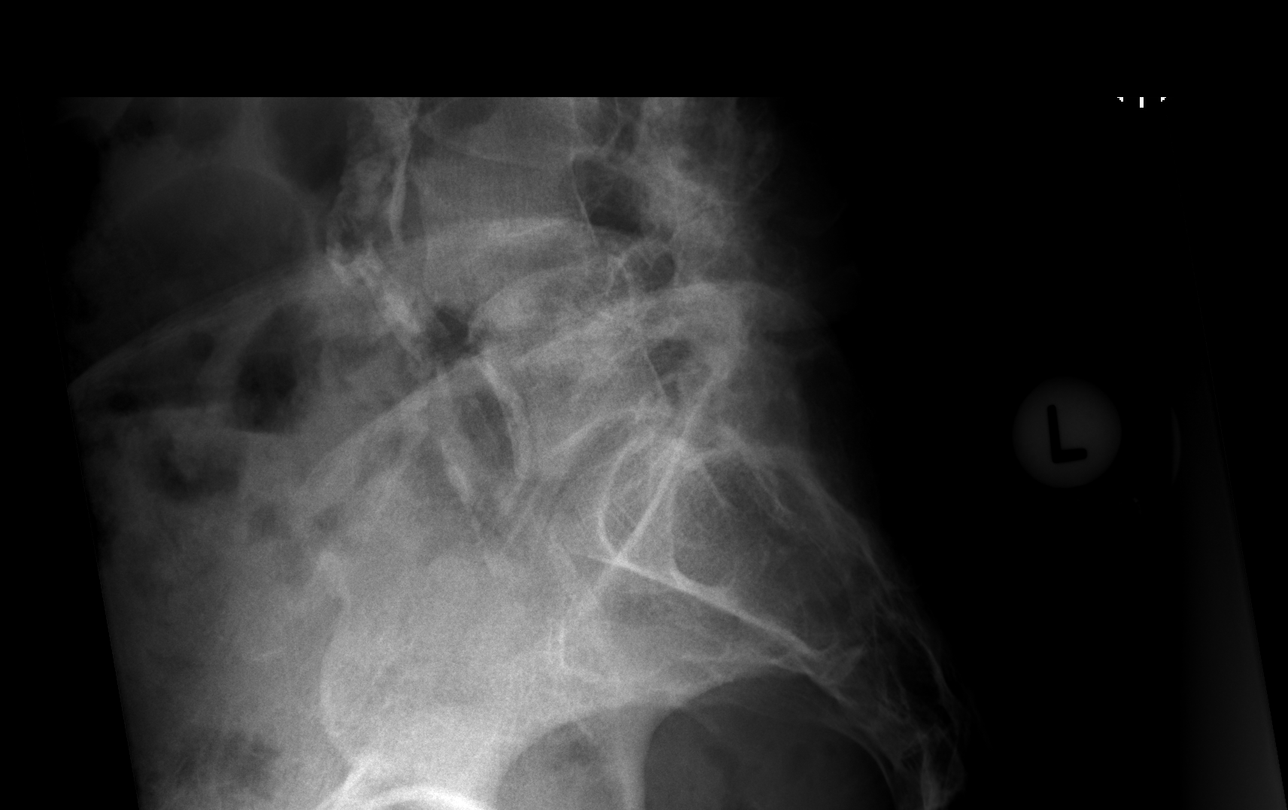

[5 of 5 positions shown; findings below may reference images not displayed]

FINDINGS: Lumbar Spine:

Lumbar vertebral elements maintain normal alignment without evidence
of subluxation.

Left apex scoliosis of the lumbar spine centered at L2-L3 with
associated degenerative disc disease which is most pronounced at
L4-L5..

No fracture line identified.

Vertebral body heights relatively maintained.

Endplate sclerosis at L2-L3, L4-L5, and L5-S1.

Advanced facet disease of L3-S1.

Calcifications of the abdominal aorta and iliac arteries.
IMPRESSION: Negative for acute fracture malalignment of the lumbar spine.

Left apex curvature centered at L2-L3 with associated degenerative
changes.

Aortic calcifications

## 2017-10-09 DIAGNOSIS — F411 Generalized anxiety disorder: Secondary | ICD-10-CM | POA: Diagnosis not present

## 2017-10-09 DIAGNOSIS — M7631 Iliotibial band syndrome, right leg: Secondary | ICD-10-CM | POA: Diagnosis not present

## 2017-10-09 DIAGNOSIS — Z681 Body mass index (BMI) 19 or less, adult: Secondary | ICD-10-CM | POA: Diagnosis not present

## 2017-10-09 DIAGNOSIS — I10 Essential (primary) hypertension: Secondary | ICD-10-CM | POA: Diagnosis not present

## 2017-10-13 DIAGNOSIS — M7631 Iliotibial band syndrome, right leg: Secondary | ICD-10-CM | POA: Diagnosis not present

## 2017-10-17 DIAGNOSIS — M7631 Iliotibial band syndrome, right leg: Secondary | ICD-10-CM | POA: Diagnosis not present

## 2017-10-24 DIAGNOSIS — M7631 Iliotibial band syndrome, right leg: Secondary | ICD-10-CM | POA: Diagnosis not present

## 2017-10-31 DIAGNOSIS — M7631 Iliotibial band syndrome, right leg: Secondary | ICD-10-CM | POA: Diagnosis not present

## 2017-11-10 ENCOUNTER — Encounter (HOSPITAL_COMMUNITY): Payer: Medicare Other

## 2017-11-10 ENCOUNTER — Ambulatory Visit: Payer: Medicare Other | Admitting: Family

## 2017-11-10 DIAGNOSIS — M7631 Iliotibial band syndrome, right leg: Secondary | ICD-10-CM | POA: Diagnosis not present

## 2017-11-21 ENCOUNTER — Encounter: Payer: Self-pay | Admitting: Family

## 2017-11-21 ENCOUNTER — Ambulatory Visit (INDEPENDENT_AMBULATORY_CARE_PROVIDER_SITE_OTHER): Payer: Medicare Other | Admitting: Family

## 2017-11-21 ENCOUNTER — Other Ambulatory Visit: Payer: Self-pay

## 2017-11-21 ENCOUNTER — Ambulatory Visit (HOSPITAL_COMMUNITY)
Admission: RE | Admit: 2017-11-21 | Discharge: 2017-11-21 | Disposition: A | Discharge status: Home / Self Care | Payer: Medicare Other | Source: Ambulatory Visit | Attending: Family | Admitting: Family

## 2017-11-21 VITALS — BP 159/87 | HR 88 | Temp 97.6°F | Resp 14 | Ht 60.0 in | Wt 72.0 lb

## 2017-11-21 DIAGNOSIS — I6523 Occlusion and stenosis of bilateral carotid arteries: Secondary | ICD-10-CM | POA: Diagnosis not present

## 2017-11-21 DIAGNOSIS — F17200 Nicotine dependence, unspecified, uncomplicated: Secondary | ICD-10-CM | POA: Diagnosis not present

## 2017-11-21 NOTE — Patient Instructions (Signed)
Steps to Quit Smoking Smoking tobacco can be bad for your health. It can also affect almost every organ in your body. Smoking puts you and people around you at risk for many serious long-lasting (chronic) diseases. Quitting smoking is hard, but it is one of the best things that you can do for your health. It is never too late to quit. What are the benefits of quitting smoking? When you quit smoking, you lower your risk for getting serious diseases and conditions. They can include:  Lung cancer or lung disease.  Heart disease.  Stroke.  Heart attack.  Not being able to have children (infertility).  Weak bones (osteoporosis) and broken bones (fractures).  If you have coughing, wheezing, and shortness of breath, those symptoms may get better when you quit. You may also get sick less often. If you are pregnant, quitting smoking can help to lower your chances of having a baby of low birth weight. What can I do to help me quit smoking? Talk with your doctor about what can help you quit smoking. Some things you can do (strategies) include:  Quitting smoking totally, instead of slowly cutting back how much you smoke over a period of time.  Going to in-person counseling. You are more likely to quit if you go to many counseling sessions.  Using resources and support systems, such as: ? Online chats with a counselor. ? Phone quitlines. ? Printed self-help materials. ? Support groups or group counseling. ? Text messaging programs. ? Mobile phone apps or applications.  Taking medicines. Some of these medicines may have nicotine in them. If you are pregnant or breastfeeding, do not take any medicines to quit smoking unless your doctor says it is okay. Talk with your doctor about counseling or other things that can help you.  Talk with your doctor about using more than one strategy at the same time, such as taking medicines while you are also going to in-person counseling. This can help make  quitting easier. What things can I do to make it easier to quit? Quitting smoking might feel very hard at first, but there is a lot that you can do to make it easier. Take these steps:  Talk to your family and friends. Ask them to support and encourage you.  Call phone quitlines, reach out to support groups, or work with a counselor.  Ask people who smoke to not smoke around you.  Avoid places that make you want (trigger) to smoke, such as: ? Bars. ? Parties. ? Smoke-break areas at work.  Spend time with people who do not smoke.  Lower the stress in your life. Stress can make you want to smoke. Try these things to help your stress: ? Getting regular exercise. ? Deep-breathing exercises. ? Yoga. ? Meditating. ? Doing a body scan. To do this, close your eyes, focus on one area of your body at a time from head to toe, and notice which parts of your body are tense. Try to relax the muscles in those areas.  Download or buy apps on your mobile phone or tablet that can help you stick to your quit plan. There are many free apps, such as QuitGuide from the CDC (Centers for Disease Control and Prevention). You can find more support from smokefree.gov and other websites.  This information is not intended to replace advice given to you by your health care provider. Make sure you discuss any questions you have with your health care provider. Document Released: 03/26/2009 Document   Revised: 01/26/2016 Document Reviewed: 10/14/2014 Elsevier Interactive Patient Education  2018 Elsevier Inc[employer]     Stroke Prevention Some health problems and behaviors may make it more likely for you to have a stroke. Below are ways to lessen your risk of having a stroke.  Be active for at least 30 minutes on most or all days.  Do not smoke. Try not to be around others who smoke.  Do not drink too much alcohol. ? Do not have more than 2 drinks a day if you are a man. ? Do not have more than 1 drink a day if you  are a woman and are not pregnant.  Eat healthy foods, such as fruits and vegetables. If you were put on a specific diet, follow the diet as told.  Keep your cholesterol levels under control through diet and medicines. Look for foods that are low in saturated fat, trans fat, cholesterol, and are high in fiber.  If you have diabetes, follow all diet plans and take your medicine as told.  Ask your doctor if you need treatment to lower your blood pressure. If you have high blood pressure (hypertension), follow all diet plans and take your medicine as told by your doctor.  If you are 18-39 years old, have your blood pressure checked every 3-5 years. If you are age 40 or older, have your blood pressure checked every year.  Keep a healthy weight. Eat foods that are low in calories, salt, saturated fat, trans fat, and cholesterol.  Do not take drugs.  Avoid birth control pills, if this applies. Talk to your doctor about the risks of taking birth control pills.  Talk to your doctor if you have sleep problems (sleep apnea).  Take all medicine as told by your doctor. ? You may be told to take aspirin or blood thinner medicine. Take this medicine as told by your doctor. ? Understand your medicine instructions.  Make sure any other conditions you have are being taken care of.  Get help right away if:  You suddenly lose feeling (you feel numb) or have weakness in your face, arm, or leg.  Your face or eyelid hangs down to one side.  You suddenly feel confused.  You have trouble talking (aphasia) or understanding what people are saying.  You suddenly have trouble seeing in one or both eyes.  You suddenly have trouble walking.  You are dizzy.  You lose your balance or your movements are clumsy (uncoordinated).  You suddenly have a very bad headache and you do not know the cause.  You have new chest pain.  Your heart feels like it is fluttering or skipping a beat (irregular  heartbeat). Do not wait to see if the symptoms above go away. Get help right away. Call your local emergency services (911 in [place] Do not drive yourself to the hospital. This information is not intended to replace advice given to you by your health care provider. Make sure you discuss any questions you have with your health care provider. Document Released: 11/29/2011 Document Revised: 11/05/2015 Document Reviewed: 11/30/2012 Elsevier Interactive Patient Education  2018 Elsevier Inc.  

## 2017-11-21 NOTE — Progress Notes (Signed)
Vitals:   11/21/17 1342 11/21/17 1348  BP: (!) 165/85 (!) 163/85  Pulse: 86 88  Resp: 14   Temp: 97.6 F (36.4 C)   TempSrc: Oral   SpO2: 99%   Weight: 72 lb (32.7 kg)   Height: 5' (1.524 m)

## 2017-11-21 NOTE — Progress Notes (Signed)
Chief Complaint: Follow up Extracranial Carotid Artery Stenosis   History of Present Illness  Crystal Noble is a 72 y.o. female whom Dr. Bridgett Larsson has been monitoring for extracranial carotid artery stenosis. Previous carotid studies demonstrated: RICA <62% stenosis, LICA 22-97% stenosis with calcification. Pt did not want CTA neck to evluate the L ICA stenosis. Patient has no history of TIA or stroke symptom. The patient has never had amaurosis fugax or monocular blindness. The patient has never had unilateral facial drooping or hemiplegia. The patient has never had receptive or expressive aphasia.  She has not had previous carotid artery intervention.   On ROS today: no intermittent claudication, no post prandial abdominal pain. She has been diagnosed with collagenous colitis, is taking a corticosteroid which is helping.  She has lost 8 pounds since she was here a year ago. "They don't know why I'm losing weight".   She sees ortho about right IT band inflammation, she states this is improving.   She denies headache, denies dyspnea, denies chest pain.   Pt Diabetic: no Pt smoker: smoker (1/2 ppd, started at age 20 yrs)  Pt meds include: Statin :no, pt states she is not taking atorvastatin which is on her list.  ASA: yes Other anticoagulants/antiplatelets: no     Past Medical History:  Diagnosis Date  . Carotid stenosis, asymptomatic   . Colitis, collagenous   . Hypertension     Social History Social History   Tobacco Use  . Smoking status: Current Every Day Smoker    Packs/day: 0.50    Years: 30.00    Pack years: 15.00    Types: Cigarettes  . Smokeless tobacco: Never Used  Substance Use Topics  . Alcohol use: Yes    Alcohol/week: 12.6 oz    Types: 21 Glasses of wine per week  . Drug use: No    Family History Family History  Problem Relation Age of Onset  . Hypertension Mother   . Hyperlipidemia Mother   . Heart attack Mother   . Depression  Mother        Bi-Polar  . Cancer Father        Lung    Surgical History Past Surgical History:  Procedure Laterality Date  . APPENDECTOMY      Allergies  Allergen Reactions  . Other   . Penicillin G Rash    Current Outpatient Medications  Medication Sig Dispense Refill  . amLODipine (NORVASC) 5 MG tablet Take 10 mg by mouth daily. Reported on 10/02/2015    . budesonide (ENTOCORT EC) 3 MG 24 hr capsule Take 9 mg by mouth daily.  0  . Calcium Carb-Cholecalciferol 600-800 MG-UNIT TABS Take 1 tablet by mouth daily. Reported on 10/02/2015    . calcium-vitamin D 250-100 MG-UNIT per tablet Take 1 tablet by mouth daily. 600 mg daily    . escitalopram (LEXAPRO) 5 MG tablet     . hydrochlorothiazide (HYDRODIURIL) 12.5 MG tablet TAKE 1 TABLET (12.5 MG) BY MOUTH ONE TIME  3  . Multiple Vitamin (MULTIVITAMIN) capsule Take 1 capsule by mouth daily.    . valACYclovir (VALTREX) 1000 MG tablet Take by mouth.    Marland Kitchen amLODipine (NORVASC) 10 MG tablet     . aspirin 81 MG tablet Take 81 mg by mouth daily.    Marland Kitchen atorvastatin (LIPITOR) 40 MG tablet Take 40 mg by mouth daily at 6 PM. Reported on 10/02/2015    . buPROPion (WELLBUTRIN XL) 150 MG 24 hr tablet Take 150  mg by mouth daily. Reported on 10/02/2015    . co-enzyme Q-10 30 MG capsule Take 400 mg by mouth 3 (three) times daily. Reported on 10/02/2015    . losartan (COZAAR) 100 MG tablet Take 100 mg by mouth daily. Reported on 10/02/2015    . megestrol (MEGACE) 40 MG tablet Take 40 mg by mouth daily. Reported on 10/02/2015    . montelukast (SINGULAIR) 10 MG tablet Take 10 mg by mouth at bedtime. Reported on 10/02/2015    . Omega-3 Fatty Acids (FISH OIL) 1000 MG CAPS Take 1 capsule by mouth daily. Reported on 10/02/2015    . valsartan (DIOVAN) 160 MG tablet Reported on 10/02/2015     No current facility-administered medications for this visit.     Review of Systems : See HPI for pertinent positives and negatives.  Physical Examination  Vitals:    11/21/17 1342 11/21/17 1348 11/21/17 1349  BP: (!) 165/85 (!) 163/85 (!) 159/87  Pulse: 86 88 88  Resp: 14    Temp: 97.6 F (36.4 C)    TempSrc: Oral    SpO2: 99%    Weight: 72 lb (32.7 kg)    Height: 5' (1.524 m)     Body mass index is 14.06 kg/m.  General: WDWN cachectic female in NAD GAIT: normal Eyes: PERRLA HENT: No gross abnormalities.  Pulmonary:  Respirations are non-labored, fair air movement in all fields, CTAB, no rales, rhonchi, or wheezing. Cardiac: regular rhythm, no detected murmur.  VASCULAR EXAM Carotid Bruits Right Left   Positive Positive     Abdominal aortic pulse is not palpable. Radial pulses are 2+ palpable and equal.                                                                                                                            LE Pulses Right Left       FEMORAL   palpable   palpable        POPLITEAL  not palpable   not palpable       POSTERIOR TIBIAL  faintly palpable   faintly palpable        DORSALIS PEDIS      ANTERIOR TIBIAL  palpable   palpable    Gastrointestinal: soft, nontender, BS WNL, no r/g, no palpable masses. Musculoskeletal: no muscle atrophy/wasting. M/S 5/5 throughout, extremities without ischemic changes. Skin: No rashes, no ulcers, no cellulitis.   Neurologic:  A&O X 3; appropriate affect, sensation is normal; speech is normal, CN 2-12 intact, pain and light touch intact in extremities, motor exam as listed above. Psychiatric: Normal thought content, mood appropriate to clinical situation.     Assessment: Crystal Noble is a 72 y.o. female who has no history of stroke or TIA, she has bilateral carotid bruits.  Her atherosclerotic risk factors include active smoking and uncontrolled hypertension. Fortunately she does not have DM.  Pt states she sees her PCP on a regular basis; I advised her  to see her PCP soon re her asymptomatic elevated blood pressure.     DATA Carotid Duplex (11/21/17): Right  ICA: 1-39% stenosis Left ICA: 40-59% stenosis Left ECA: >50% stenosis Bilateral vertebral artery flow is antegrade.  Bilateral subclavian artery waveforms are normal.  No significant change compared to the exams on 10-02-15 and 11-02-16.    Plan: Follow-up in 1year with Carotid Duplex scan.   The patient was counseled re smoking cessation   I discussed in depth with the patient the nature of atherosclerosis, and emphasized the importance of maximal medical management including strict control of blood pressure, blood glucose, and lipid levels, obtaining regular exercise, and cessation of smoking.  The patient is aware that without maximal medical management the underlying atherosclerotic disease process will progress, limiting the benefit of any interventions. The patient was given information about stroke prevention and what symptoms should prompt the patient to seek immediate medical care. Thank you for allowing [place] to participate in this patient's care.  Clemon Chambers, RN, MSN, FNP-C [facility] Office: [phone]  Clinic Physician: Early  11/21/17 1:58 PM

## 2017-11-24 DIAGNOSIS — H2513 Age-related nuclear cataract, bilateral: Secondary | ICD-10-CM | POA: Diagnosis not present

## 2017-11-24 DIAGNOSIS — H25013 Cortical age-related cataract, bilateral: Secondary | ICD-10-CM | POA: Diagnosis not present

## 2017-12-29 ENCOUNTER — Other Ambulatory Visit: Payer: Self-pay | Admitting: Family Medicine

## 2017-12-29 DIAGNOSIS — Z681 Body mass index (BMI) 19 or less, adult: Secondary | ICD-10-CM | POA: Diagnosis not present

## 2017-12-29 DIAGNOSIS — Z1211 Encounter for screening for malignant neoplasm of colon: Secondary | ICD-10-CM | POA: Diagnosis not present

## 2017-12-29 DIAGNOSIS — Z1231 Encounter for screening mammogram for malignant neoplasm of breast: Secondary | ICD-10-CM

## 2017-12-29 DIAGNOSIS — Z0000 Encounter for general adult medical examination without abnormal findings: Secondary | ICD-10-CM | POA: Diagnosis not present

## 2018-01-23 ENCOUNTER — Ambulatory Visit
Admission: RE | Admit: 2018-01-23 | Discharge: 2018-01-23 | Disposition: A | Discharge status: Home / Self Care | Payer: Medicare Other | Source: Ambulatory Visit | Attending: Family Medicine | Admitting: Family Medicine

## 2018-01-23 DIAGNOSIS — Z1231 Encounter for screening mammogram for malignant neoplasm of breast: Secondary | ICD-10-CM

## 2018-01-30 DIAGNOSIS — H2513 Age-related nuclear cataract, bilateral: Secondary | ICD-10-CM | POA: Diagnosis not present

## 2018-02-19 DIAGNOSIS — I10 Essential (primary) hypertension: Secondary | ICD-10-CM | POA: Diagnosis not present

## 2018-02-19 DIAGNOSIS — Z681 Body mass index (BMI) 19 or less, adult: Secondary | ICD-10-CM | POA: Diagnosis not present

## 2018-02-19 DIAGNOSIS — R634 Abnormal weight loss: Secondary | ICD-10-CM | POA: Diagnosis not present

## 2018-02-19 DIAGNOSIS — M7631 Iliotibial band syndrome, right leg: Secondary | ICD-10-CM | POA: Diagnosis not present

## 2018-02-19 DIAGNOSIS — Z23 Encounter for immunization: Secondary | ICD-10-CM | POA: Diagnosis not present

## 2018-03-05 DIAGNOSIS — L03113 Cellulitis of right upper limb: Secondary | ICD-10-CM | POA: Diagnosis not present

## 2018-03-05 DIAGNOSIS — Z681 Body mass index (BMI) 19 or less, adult: Secondary | ICD-10-CM | POA: Diagnosis not present

## 2018-03-05 DIAGNOSIS — W5503XA Scratched by cat, initial encounter: Secondary | ICD-10-CM | POA: Diagnosis not present

## 2018-03-08 DIAGNOSIS — Z681 Body mass index (BMI) 19 or less, adult: Secondary | ICD-10-CM | POA: Diagnosis not present

## 2018-03-08 DIAGNOSIS — L03113 Cellulitis of right upper limb: Secondary | ICD-10-CM | POA: Diagnosis not present

## 2018-04-02 DIAGNOSIS — F411 Generalized anxiety disorder: Secondary | ICD-10-CM | POA: Diagnosis not present

## 2018-04-02 DIAGNOSIS — R634 Abnormal weight loss: Secondary | ICD-10-CM | POA: Diagnosis not present

## 2018-04-02 DIAGNOSIS — I10 Essential (primary) hypertension: Secondary | ICD-10-CM | POA: Diagnosis not present

## 2018-07-10 DIAGNOSIS — I10 Essential (primary) hypertension: Secondary | ICD-10-CM | POA: Diagnosis not present

## 2018-07-10 DIAGNOSIS — R5383 Other fatigue: Secondary | ICD-10-CM | POA: Diagnosis not present

## 2018-07-10 DIAGNOSIS — Z79899 Other long term (current) drug therapy: Secondary | ICD-10-CM | POA: Diagnosis not present

## 2018-07-10 DIAGNOSIS — E785 Hyperlipidemia, unspecified: Secondary | ICD-10-CM | POA: Diagnosis not present

## 2018-07-10 DIAGNOSIS — E559 Vitamin D deficiency, unspecified: Secondary | ICD-10-CM | POA: Diagnosis not present

## 2018-07-10 DIAGNOSIS — R945 Abnormal results of liver function studies: Secondary | ICD-10-CM | POA: Diagnosis not present

## 2018-07-16 DIAGNOSIS — Z681 Body mass index (BMI) 19 or less, adult: Secondary | ICD-10-CM | POA: Diagnosis not present

## 2018-07-16 DIAGNOSIS — R5383 Other fatigue: Secondary | ICD-10-CM | POA: Diagnosis not present

## 2018-07-16 DIAGNOSIS — R630 Anorexia: Secondary | ICD-10-CM | POA: Diagnosis not present

## 2018-07-16 DIAGNOSIS — I10 Essential (primary) hypertension: Secondary | ICD-10-CM | POA: Diagnosis not present

## 2018-12-25 DIAGNOSIS — L659 Nonscarring hair loss, unspecified: Secondary | ICD-10-CM | POA: Diagnosis not present

## 2018-12-25 DIAGNOSIS — R5383 Other fatigue: Secondary | ICD-10-CM | POA: Diagnosis not present

## 2018-12-25 DIAGNOSIS — D510 Vitamin B12 deficiency anemia due to intrinsic factor deficiency: Secondary | ICD-10-CM | POA: Diagnosis not present

## 2018-12-25 DIAGNOSIS — E559 Vitamin D deficiency, unspecified: Secondary | ICD-10-CM | POA: Diagnosis not present

## 2018-12-25 DIAGNOSIS — D518 Other vitamin B12 deficiency anemias: Secondary | ICD-10-CM | POA: Diagnosis not present

## 2018-12-25 DIAGNOSIS — R634 Abnormal weight loss: Secondary | ICD-10-CM | POA: Diagnosis not present

## 2018-12-28 DIAGNOSIS — I10 Essential (primary) hypertension: Secondary | ICD-10-CM | POA: Diagnosis not present

## 2018-12-28 DIAGNOSIS — F411 Generalized anxiety disorder: Secondary | ICD-10-CM | POA: Diagnosis not present

## 2018-12-28 DIAGNOSIS — R634 Abnormal weight loss: Secondary | ICD-10-CM | POA: Diagnosis not present

## 2018-12-28 DIAGNOSIS — R630 Anorexia: Secondary | ICD-10-CM | POA: Diagnosis not present

## 2018-12-31 DIAGNOSIS — K52831 Collagenous colitis: Secondary | ICD-10-CM | POA: Diagnosis not present

## 2018-12-31 DIAGNOSIS — J309 Allergic rhinitis, unspecified: Secondary | ICD-10-CM | POA: Diagnosis not present

## 2018-12-31 DIAGNOSIS — R5383 Other fatigue: Secondary | ICD-10-CM | POA: Diagnosis not present

## 2018-12-31 DIAGNOSIS — Z0000 Encounter for general adult medical examination without abnormal findings: Secondary | ICD-10-CM | POA: Diagnosis not present

## 2018-12-31 DIAGNOSIS — R630 Anorexia: Secondary | ICD-10-CM | POA: Diagnosis not present

## 2019-01-14 ENCOUNTER — Other Ambulatory Visit: Payer: Self-pay

## 2019-01-25 DIAGNOSIS — F411 Generalized anxiety disorder: Secondary | ICD-10-CM | POA: Diagnosis not present

## 2019-01-25 DIAGNOSIS — I10 Essential (primary) hypertension: Secondary | ICD-10-CM | POA: Diagnosis not present

## 2019-01-25 DIAGNOSIS — R634 Abnormal weight loss: Secondary | ICD-10-CM | POA: Diagnosis not present

## 2019-01-30 ENCOUNTER — Other Ambulatory Visit: Payer: Self-pay | Admitting: Family Medicine

## 2019-01-30 DIAGNOSIS — Z87891 Personal history of nicotine dependence: Secondary | ICD-10-CM

## 2019-03-01 ENCOUNTER — Ambulatory Visit
Admission: RE | Admit: 2019-03-01 | Discharge: 2019-03-01 | Disposition: A | Discharge status: Home / Self Care | Payer: Medicare Other | Source: Ambulatory Visit | Attending: Family Medicine | Admitting: Family Medicine

## 2019-03-01 DIAGNOSIS — Z87891 Personal history of nicotine dependence: Secondary | ICD-10-CM

## 2019-03-01 DIAGNOSIS — F17210 Nicotine dependence, cigarettes, uncomplicated: Secondary | ICD-10-CM | POA: Diagnosis not present

## 2019-03-01 IMAGING — CT CT CHEST LUNG CANCER SCREENING LOW DOSE W/O CM
2 of 5 series · 14 of 40 positions shown, 17 images · non-contrast
Comparison: No priors.

CLINICAL DATA: 73-year-old female current smoker with 49 pack year
history of smoking. Lung cancer screening examination.

EXAM:
CT CHEST WITHOUT CONTRAST LOW-DOSE FOR LUNG CANCER SCREENING
TECHNIQUE: Multidetector CT imaging of the chest was performed following the
standard protocol without IV contrast.

[Series 4: lung 1.00 br44 cor · coronal · 0.55mm/px · 3 of 277 slices shown]
[im 56/277  lung]
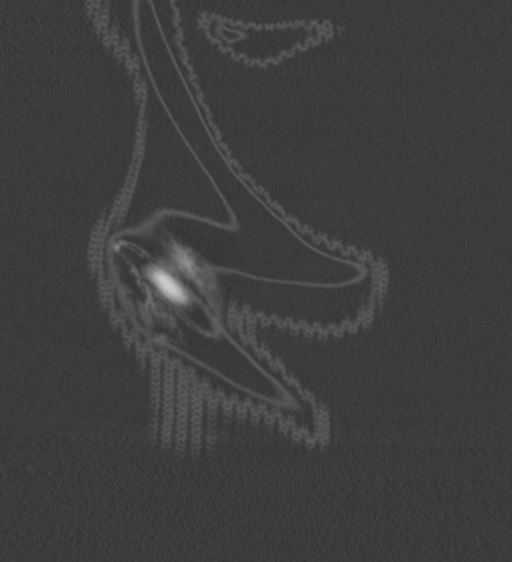
[im 111/277  lung]
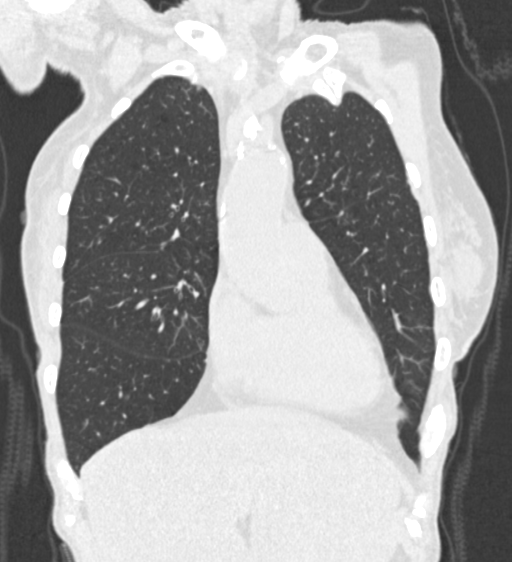
[im 166/277  lung]
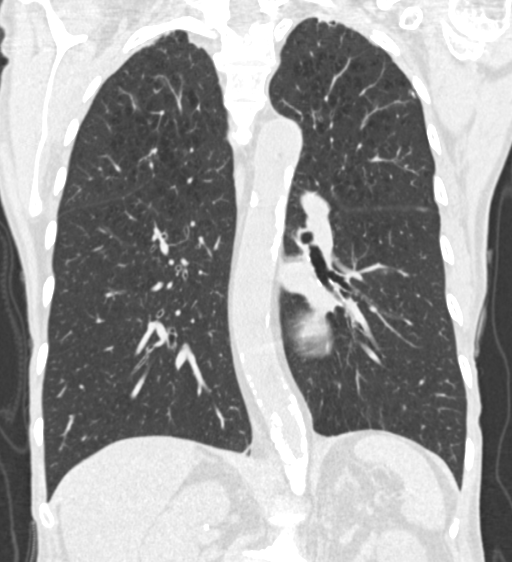

[Series 9: lung 1.00 br60 axial · axial · 0.55mm/px · z∈[-1159,-879]mm · 11 of 310 slices shown, 14 images]
[im 15/310  mediastinal]
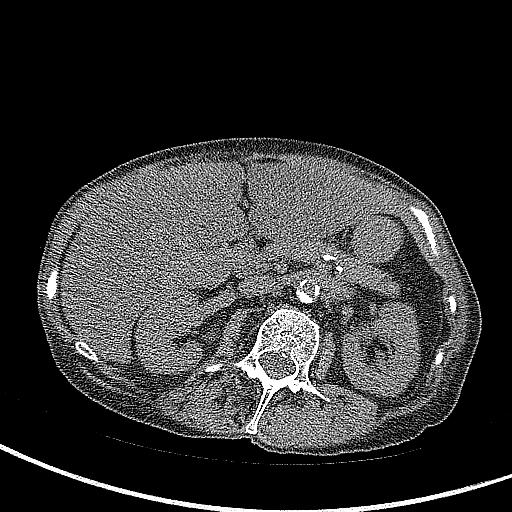
[im 15/310  lung]
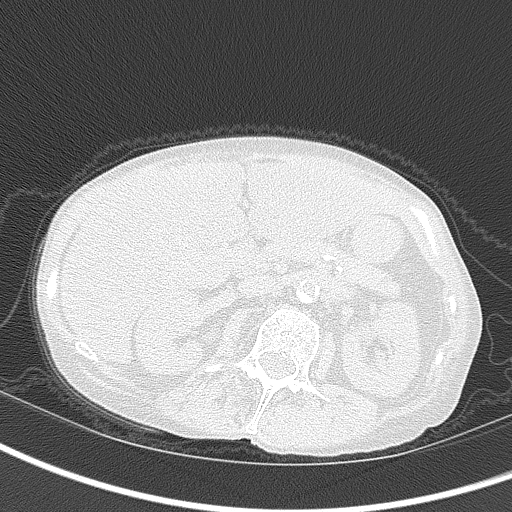
[im 43/310  lung]
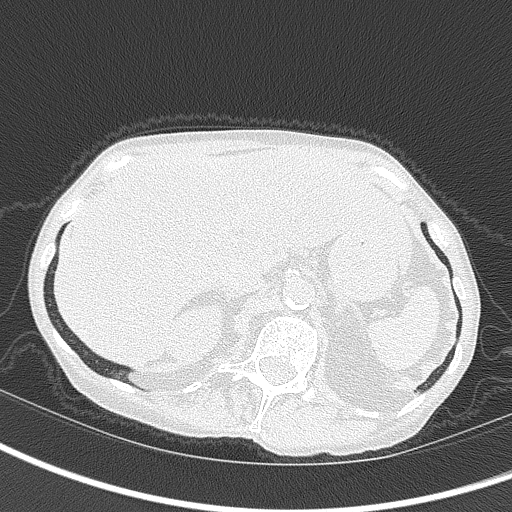
[im 71/310  lung]
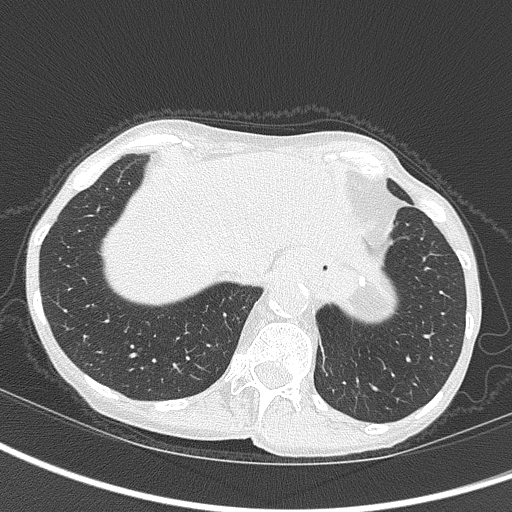
[im 99/310  lung]
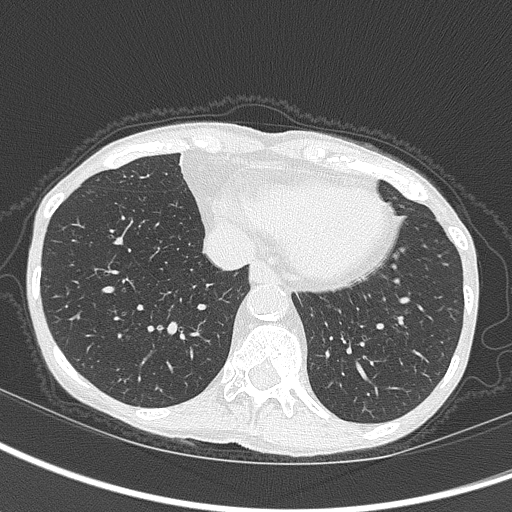
[im 127/310  mediastinal]
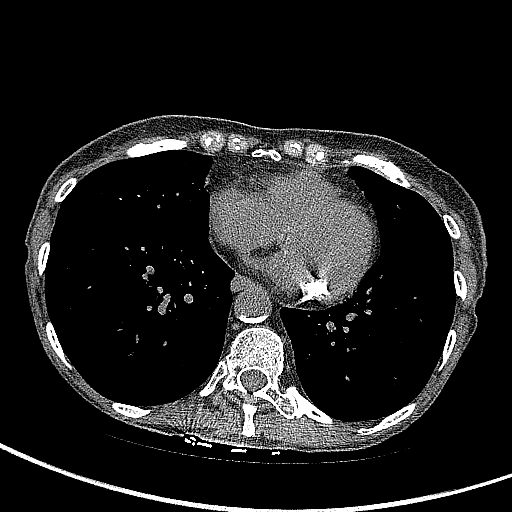
[im 127/310  lung]
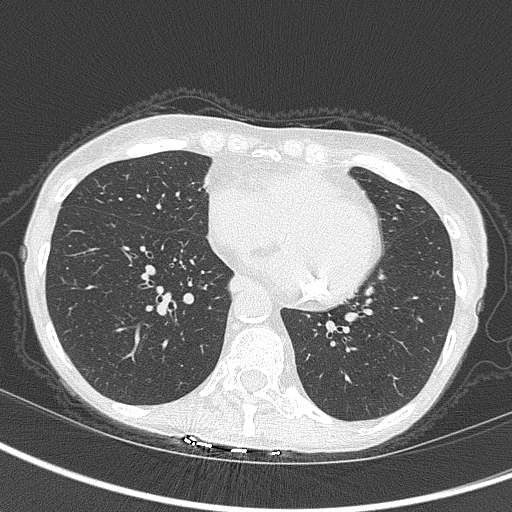
[im 155/310  lung]
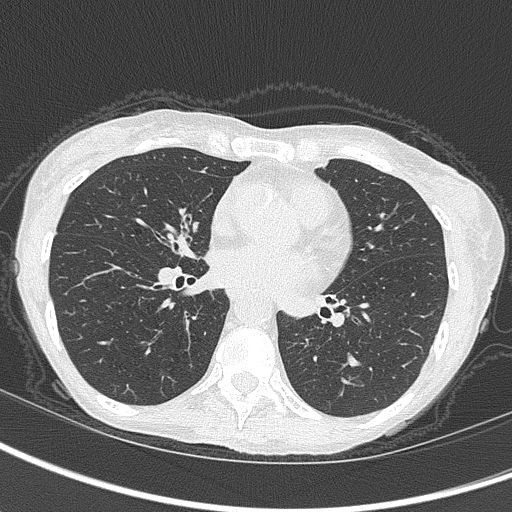
[im 183/310  lung]
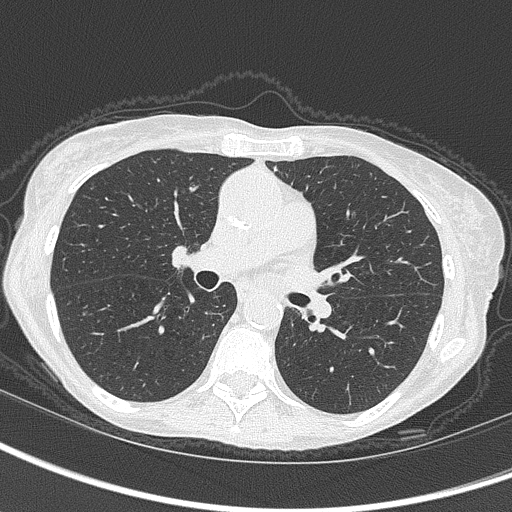
[im 211/310  lung]
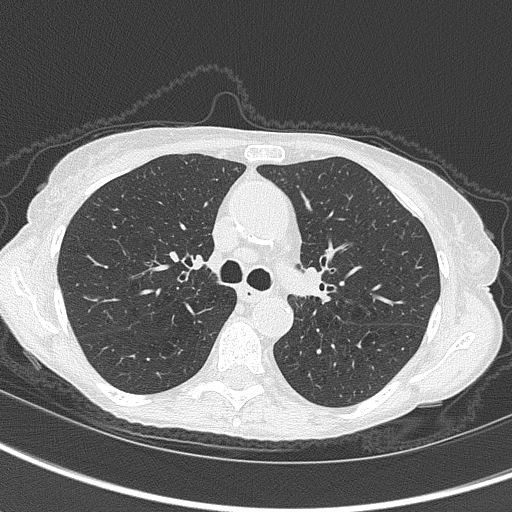
[im 239/310  mediastinal]
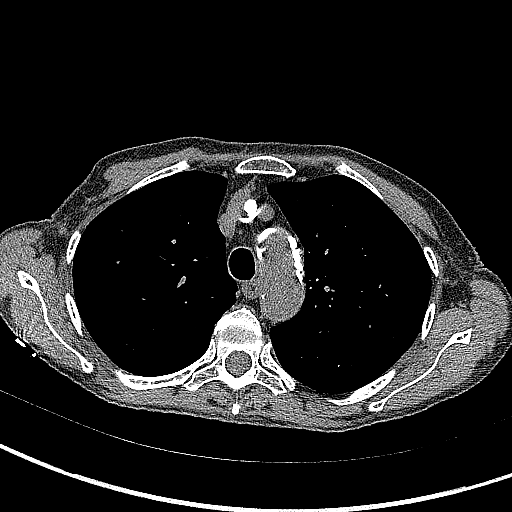
[im 239/310  lung]
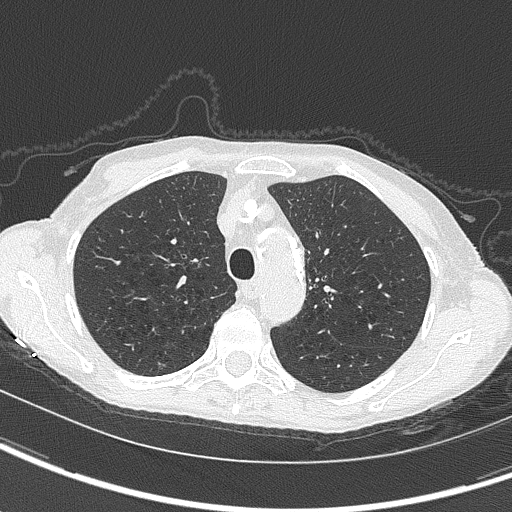
[im 267/310  lung]
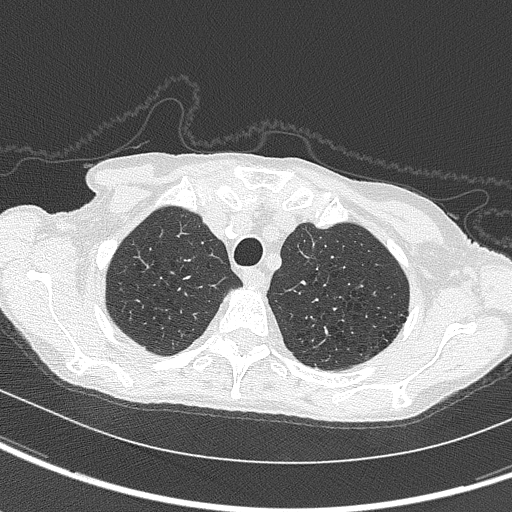
[im 295/310  lung]
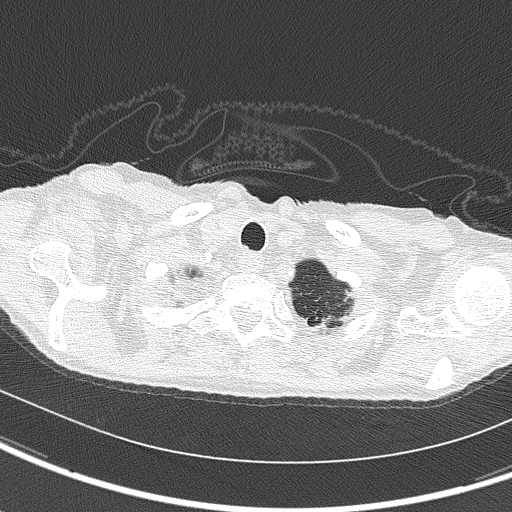

[14 of 40 positions shown; findings below may reference images not displayed]

FINDINGS: Cardiovascular: Heart size is normal. There is no significant
pericardial fluid, thickening or pericardial calcification. There is
aortic atherosclerosis, as well as atherosclerosis of the great
vessels of the mediastinum and the coronary arteries, including
calcified atherosclerotic plaque in the left circumflex and right
coronary arteries. Calcifications of the mitral annulus.

Mediastinum/Nodes: No pathologically enlarged mediastinal or hilar
lymph nodes. Please note that accurate exclusion of hilar adenopathy
is limited on noncontrast CT scans. Esophagus is unremarkable in
appearance. No axillary lymphadenopathy.

Lungs/Pleura: Small pulmonary nodules are noted, largest of which is
in the periphery of the inferior segment of the lingula (axial image
170 of series 3), with a volume derived mean diameter of 3.6 mm. No
larger more suspicious appearing pulmonary nodules or masses are
noted. No acute consolidative airspace disease. No pleural
effusions. Diffuse bronchial wall thickening with mild centrilobular
and paraseptal emphysema.

Upper Abdomen: Aortic atherosclerosis. Diffuse low attenuation
throughout the visualized hepatic parenchyma, indicative of hepatic
steatosis.

Musculoskeletal: There are no aggressive appearing lytic or blastic
lesions noted in the visualized portions of the skeleton.
IMPRESSION: 1. Lung-RADS 2S, benign appearance or behavior. Continue annual
screening with low-dose chest CT without contrast in 12 months.
2. The "S" modifier above refers to potentially clinically
significant non lung cancer related findings. Specifically, there is
aortic atherosclerosis, in addition to 2 vessel coronary artery
disease. Please note that although the presence of coronary artery
calcium documents the presence of coronary artery disease, the
severity of this disease and any potential stenosis cannot be
assessed on this non-gated CT examination. Assessment for potential
risk factor modification, dietary therapy or pharmacologic therapy
may be warranted, if clinically indicated.
3. Mild diffuse bronchial wall thickening with mild centrilobular
and paraseptal emphysema; imaging findings suggestive of underlying
COPD.
4. There are calcifications of the mitral annulus. Echocardiographic
correlation for evaluation of potential valvular dysfunction may be
warranted if clinically indicated.
5. Hepatic steatosis.

Aortic Atherosclerosis ([6W]-[6W]) and Emphysema ([6W]-[6W]).

## 2019-03-05 DIAGNOSIS — Z23 Encounter for immunization: Secondary | ICD-10-CM | POA: Diagnosis not present

## 2019-03-11 DIAGNOSIS — R634 Abnormal weight loss: Secondary | ICD-10-CM | POA: Diagnosis not present

## 2019-03-11 DIAGNOSIS — I2510 Atherosclerotic heart disease of native coronary artery without angina pectoris: Secondary | ICD-10-CM | POA: Diagnosis not present

## 2019-03-11 DIAGNOSIS — F17200 Nicotine dependence, unspecified, uncomplicated: Secondary | ICD-10-CM | POA: Diagnosis not present

## 2019-03-11 DIAGNOSIS — F411 Generalized anxiety disorder: Secondary | ICD-10-CM | POA: Diagnosis not present

## 2019-03-11 DIAGNOSIS — J449 Chronic obstructive pulmonary disease, unspecified: Secondary | ICD-10-CM | POA: Diagnosis not present

## 2019-03-26 DIAGNOSIS — J309 Allergic rhinitis, unspecified: Secondary | ICD-10-CM | POA: Diagnosis not present

## 2019-03-26 DIAGNOSIS — I2510 Atherosclerotic heart disease of native coronary artery without angina pectoris: Secondary | ICD-10-CM | POA: Diagnosis not present

## 2019-03-26 DIAGNOSIS — J449 Chronic obstructive pulmonary disease, unspecified: Secondary | ICD-10-CM | POA: Diagnosis not present

## 2019-03-27 ENCOUNTER — Other Ambulatory Visit: Payer: Self-pay

## 2019-03-27 ENCOUNTER — Ambulatory Visit (INDEPENDENT_AMBULATORY_CARE_PROVIDER_SITE_OTHER): Payer: Medicare Other | Admitting: Cardiology

## 2019-03-27 ENCOUNTER — Encounter: Payer: Self-pay | Admitting: Cardiology

## 2019-03-27 VITALS — BP 147/71 | HR 99 | Temp 97.8°F | Ht 60.0 in | Wt 76.0 lb

## 2019-03-27 DIAGNOSIS — I6523 Occlusion and stenosis of bilateral carotid arteries: Secondary | ICD-10-CM

## 2019-03-27 DIAGNOSIS — I700 Atherosclerosis of aorta: Secondary | ICD-10-CM | POA: Diagnosis not present

## 2019-03-27 DIAGNOSIS — I10 Essential (primary) hypertension: Secondary | ICD-10-CM

## 2019-03-27 DIAGNOSIS — R0600 Dyspnea, unspecified: Secondary | ICD-10-CM

## 2019-03-27 DIAGNOSIS — Z720 Tobacco use: Secondary | ICD-10-CM | POA: Diagnosis not present

## 2019-03-27 DIAGNOSIS — I2510 Atherosclerotic heart disease of native coronary artery without angina pectoris: Secondary | ICD-10-CM | POA: Diagnosis not present

## 2019-03-27 DIAGNOSIS — R0609 Other forms of dyspnea: Secondary | ICD-10-CM

## 2019-03-27 MED ORDER — CLOPIDOGREL BISULFATE 75 MG PO TABS: 75.0000 mg | ORAL_TABLET | Freq: Every day | ORAL | 3 refills | Status: DC

## 2019-03-27 NOTE — Progress Notes (Signed)
Patient referred by Fanny Bien, MD for coronary artery calcification  Subjective:   Crystal Noble, female    DOB: 05/15/1946, 73 y.o.   MRN: [id number]   Chief Complaint  Patient presents with  . Coronary Artery Disease  . New Patient (Initial Visit)    HPI  73 y.o. Caucasian female with CAD (noted on CT chest), asymptomatic carotid artery disease, COPD, tobacco abuse.  Patient lives alone, exercises on stationary bike for 20 minutes 1-2 times every week.  She denies any exertional chest pain, but has stable exertional dyspnea.  Her biggest complaint is generalized fatigue for last 3 or 4 years.  She is on amlodipine and hydrochlorothiazide for hypertension.  She has not tolerated statins in the past, and does not want to start them at this time.  She continues to smoke 10 cigarettes/day, and is not ready to quit.   Past Medical History:  Diagnosis Date  . Carotid stenosis, asymptomatic   . Colitis, collagenous   . Hypertension      Past Surgical History:  Procedure Laterality Date  . APPENDECTOMY       Social History   Socioeconomic History  . Marital status: Widowed    Spouse name: Not on file  . Number of children: Not on file  . Years of education: Not on file  . Highest education level: Not on file  Occupational History  . Not on file  Social Needs  . Financial resource strain: Not on file  . Food insecurity    Worry: Not on file    Inability: Not on file  . Transportation needs    Medical: Not on file    Non-medical: Not on file  Tobacco Use  . Smoking status: Current Every Day Smoker    Packs/day: 0.50    Years: 30.00    Pack years: 15.00    Types: Cigarettes  . Smokeless tobacco: Never Used  Substance and Sexual Activity  . Alcohol use: Yes    Alcohol/week: 21.0 standard drinks    Types: 21 Glasses of wine per week  . Drug use: No  . Sexual activity: Not on file  Lifestyle  . Physical activity    Days per week: Not on file    Minutes per session: Not on file  . Stress: Not on file  Relationships  . Social Herbalist on phone: Not on file    Gets together: Not on file    Attends religious service: Not on file    Active member of club or organization: Not on file    Attends meetings of clubs or organizations: Not on file    Relationship status: Not on file  . Intimate partner violence    Fear of current or ex partner: Not on file    Emotionally abused: Not on file    Physically abused: Not on file    Forced sexual activity: Not on file  Other Topics Concern  . Not on file  Social History Narrative  . Not on file    Family History  Problem Relation Age of Onset  . Hypertension Mother   . Hyperlipidemia Mother   . Heart attack Mother   . Depression Mother        Bi-Polar  . Cancer Father        Lung     Current Outpatient Medications on File Prior to Visit  Medication Sig Dispense Refill  . amLODipine (NORVASC) 10 MG  tablet     . azelastine (ASTELIN) 0.1 % nasal spray     . escitalopram (LEXAPRO) 5 MG tablet Take 20 mg by mouth daily.     . hydrochlorothiazide (HYDRODIURIL) 12.5 MG tablet TAKE 1 TABLET (12.5 MG) BY MOUTH ONE TIME  3  . megestrol (MEGACE) 40 MG tablet Take 40 mg by mouth daily. Reported on 10/02/2015    . Multiple Vitamin (MULTIVITAMIN) capsule Take 1 capsule by mouth daily.    . valACYclovir (VALTREX) 1000 MG tablet Take by mouth.     No current facility-administered medications on file prior to visit.     Cardiovascular studies:  EKG 03/27/2019: Sinus rhythm 97 bpm. Biatrial enlargement. cannot exclude old anteroseptal infarct.   CT chest 03/01/2019: Two vessel coronary atherosclerosis. Aortic atherosclerosis.  Mitral annular calcification. Emphysema. Hepatic steatosis.   Carotid US 11/2017: Right Carotid: Velocities in the right ICA are consistent with a 1-39% stenosis. Left Carotid: Velocities in the left ICA are consistent with a 40-59% stenosis.                The ECA appears >50% stenosed. Vertebrals:  Bilateral vertebral arteries demonstrate antegrade flow. Subclavians: Normal flow hemodynamics were seen in bilateral subclavian arteries.   Recent labs: Not available.   Review of Systems  Constitution: Positive for malaise/fatigue. Negative for decreased appetite, weight gain and weight loss.  HENT: Negative for congestion.   Eyes: Negative for visual disturbance.  Cardiovascular: Positive for dyspnea on exertion. Negative for chest pain, leg swelling, palpitations and syncope.  Respiratory: Positive for shortness of breath. Negative for cough.   Endocrine: Negative for cold intolerance.  Hematologic/Lymphatic: Does not bruise/bleed easily.  Skin: Negative for itching and rash.  Musculoskeletal: Negative for myalgias.  Gastrointestinal: Negative for abdominal pain, nausea and vomiting.  Genitourinary: Negative for dysuria.  Neurological: Negative for dizziness and weakness.  Psychiatric/Behavioral: The patient is not nervous/anxious.   All other systems reviewed and are negative.        Vitals:   03/27/19 0912 03/27/19 0926  BP: (!) 156/67 (!) 147/71  Pulse: 96 99  Temp: 97.8 F (36.6 C)   SpO2: 98%      Body mass index is 14.84 kg/m. Filed Weights   03/27/19 0912  Weight: 76 lb (34.5 kg)     Objective:   Physical Exam  Constitutional: She is oriented to person, place, and time. She appears well-developed and well-nourished. No distress.  HENT:  Head: Normocephalic and atraumatic.  Eyes: Pupils are equal, round, and reactive to light. Conjunctivae are normal.  Neck: No JVD present.  Cardiovascular: Normal rate, regular rhythm and intact distal pulses.  No murmur heard. Pulses:      Carotid pulses are on the right side with bruit and on the left side with bruit. Pulmonary/Chest: Effort normal and breath sounds normal. She has no wheezes. She has no rales.  Abdominal: Soft. Bowel sounds are normal. There  is no rebound.  Musculoskeletal:        General: No edema.  Lymphadenopathy:    She has no cervical adenopathy.  Neurological: She is alert and oriented to person, place, and time. No cranial nerve deficit.  Skin: Skin is warm and dry.  Psychiatric: She has a normal mood and affect.  Nursing note and vitals reviewed.         Assessment & Recommendations:   73 y.o. Caucasian female with CAD (noted on CT chest), asymptomatic carotid artery disease, COPD, tobacco abuse.  Coronary calcification:  No clear anginal symptoms.  She has stable exertional dyspnea which could possibly be due to COPD or angina equivalent.  Recommend exercise/Lexiscan nuclear stress test for functional evaluation.  She is intolerant to aspirin.  To treat her peripheral artery disease, as well as coronary artery disease, recommend Plavix 75 mg daily.  Given mitral annular calcification, will also obtain echocardiogram.  After stress test, will likely add beta-blocker for medical management.  Will obtain labs from PCP to see lipid panel.  I discussed use of statin.  Patient is reluctant to start this today.  Bilateral asymptomatic carotid artery stenosis: Moderate on last ultrasound in 11/2017.  Recommend repeat carotid ultrasound.  Tobacco cessation counseling: - Currently smoking 1/2 packs/day   - Patient was informed of the dangers of tobacco abuse including stroke, cancer, and MI, as well as benefits of tobacco cessation. - Patient is not willing to quit at this time. - Approximately 5 mins were spent counseling patient cessation techniques. We discussed various methods to help quit smoking, including deciding on a date to quit, joining a support group, pharmacological agents. - I will reassess her progress at the next follow-up visit    Thank you for referring the patient to [place]. Please feel free to contact with any questions.  Nigel Mormon, MD [facility] [place] Pager: [phone] Office:  [phone] If no answer Cell [phone]

## 2019-03-27 NOTE — Addendum Note (Signed)
Addended by: Nigel Mormon on: 03/27/2019 10:52 AM   Modules accepted: Orders

## 2019-04-10 ENCOUNTER — Ambulatory Visit (INDEPENDENT_AMBULATORY_CARE_PROVIDER_SITE_OTHER): Payer: Medicare Other

## 2019-04-10 ENCOUNTER — Other Ambulatory Visit: Payer: Self-pay

## 2019-04-10 DIAGNOSIS — R0600 Dyspnea, unspecified: Secondary | ICD-10-CM

## 2019-04-10 DIAGNOSIS — I10 Essential (primary) hypertension: Secondary | ICD-10-CM

## 2019-04-10 DIAGNOSIS — R0609 Other forms of dyspnea: Secondary | ICD-10-CM

## 2019-04-10 DIAGNOSIS — I2510 Atherosclerotic heart disease of native coronary artery without angina pectoris: Secondary | ICD-10-CM | POA: Diagnosis not present

## 2019-04-10 DIAGNOSIS — Z720 Tobacco use: Secondary | ICD-10-CM | POA: Diagnosis not present

## 2019-04-10 DIAGNOSIS — I6523 Occlusion and stenosis of bilateral carotid arteries: Secondary | ICD-10-CM

## 2019-04-11 NOTE — Progress Notes (Signed)
Spoke with patient concerning results. She verbalized understanding.

## 2019-04-14 ENCOUNTER — Other Ambulatory Visit: Payer: Self-pay | Admitting: Cardiology

## 2019-04-14 DIAGNOSIS — I6523 Occlusion and stenosis of bilateral carotid arteries: Secondary | ICD-10-CM

## 2019-04-29 ENCOUNTER — Ambulatory Visit (INDEPENDENT_AMBULATORY_CARE_PROVIDER_SITE_OTHER): Payer: Medicare Other

## 2019-04-29 ENCOUNTER — Other Ambulatory Visit: Payer: Self-pay

## 2019-04-29 DIAGNOSIS — I10 Essential (primary) hypertension: Secondary | ICD-10-CM | POA: Diagnosis not present

## 2019-04-29 DIAGNOSIS — Z720 Tobacco use: Secondary | ICD-10-CM

## 2019-04-29 DIAGNOSIS — R0600 Dyspnea, unspecified: Secondary | ICD-10-CM

## 2019-04-29 DIAGNOSIS — R0609 Other forms of dyspnea: Secondary | ICD-10-CM

## 2019-04-29 DIAGNOSIS — I2510 Atherosclerotic heart disease of native coronary artery without angina pectoris: Secondary | ICD-10-CM

## 2019-04-30 NOTE — Progress Notes (Signed)
Pt aware.

## 2019-05-08 ENCOUNTER — Other Ambulatory Visit: Payer: Self-pay

## 2019-05-13 ENCOUNTER — Other Ambulatory Visit: Payer: Self-pay

## 2019-05-13 ENCOUNTER — Telehealth (INDEPENDENT_AMBULATORY_CARE_PROVIDER_SITE_OTHER): Payer: Medicare Other | Admitting: Cardiology

## 2019-05-13 VITALS — BP 185/94 | HR 103

## 2019-05-13 DIAGNOSIS — I10 Essential (primary) hypertension: Secondary | ICD-10-CM | POA: Diagnosis not present

## 2019-05-13 DIAGNOSIS — R0609 Other forms of dyspnea: Secondary | ICD-10-CM | POA: Insufficient documentation

## 2019-05-13 DIAGNOSIS — Z720 Tobacco use: Secondary | ICD-10-CM | POA: Diagnosis not present

## 2019-05-13 DIAGNOSIS — R0600 Dyspnea, unspecified: Secondary | ICD-10-CM

## 2019-05-13 DIAGNOSIS — I6523 Occlusion and stenosis of bilateral carotid arteries: Secondary | ICD-10-CM | POA: Diagnosis not present

## 2019-05-13 MED ORDER — ROSUVASTATIN CALCIUM 10 MG PO TABS: 10.0000 mg | ORAL_TABLET | Freq: Every day | ORAL | 3 refills | Status: DC

## 2019-05-13 MED ORDER — CARVEDILOL 6.25 MG PO TABS: 6.2500 mg | ORAL_TABLET | Freq: Two times a day (BID) | ORAL | 3 refills | Status: DC

## 2019-05-13 NOTE — Progress Notes (Signed)
Patient referred by Fanny Bien, MD for coronary artery calcification  Subjective:   Crystal Noble, female    DOB: 05/13/1946, 73 y.o.   MRN: [id number]   I connected with the patient on 05/12/3029 by a telephone call and verified that I am speaking with the correct person using two identifiers.     I offered the patient a video enabled application for a virtual visit. Unfortunately, this could not be accomplished due to technical difficulties/lack of video enabled phone/computer. I discussed the limitations of evaluation and management by telemedicine and the availability of in person appointments. The patient expressed understanding and agreed to proceed.   This visit type was conducted due to national recommendations for restrictions regarding the COVID-19 Pandemic (e.g. social distancing).  This format is felt to be most appropriate for this patient at this time.  All issues noted in this document were discussed and addressed.  No physical exam was performed (except for noted visual exam findings with Tele health visits).  The patient has consented to conduct a Tele health visit and understands insurance will be billed.    Chief Complaint  Patient presents with  . Coronary Artery Disease    HPI  73 y.o. Caucasian female with CAD (noted on CT chest), asymptomatic carotid artery disease, COPD, tobacco abuse.  Walking/Lexiscan stress test showed normal EF, no ischemia or infarction.  Echocardiogram showed EF 55%, moderate mitral annular calcification with mild to moderate mitral regurgitation, moderate tricuspid regurgitation, estimated PASP 29 mmHg. Carotid ultrasound showed moderate left ICA stenosis.  Patient currently has no symptoms of chest pain, has stable exertional dyspnea. She continues to smoke 10 cigarettes/day and is not ready to quit yet. She also drinks 3 glasses of wine daily for last 30 years.   Initial consult HPI 03/27/2019: Patient lives alone,  exercises on stationary bike for 20 minutes 1-2 times every week.  She denies any exertional chest pain, but has stable exertional dyspnea.  Her biggest complaint is generalized fatigue for last 3 or 4 years.  She is on amlodipine and hydrochlorothiazide for hypertension.  She has not tolerated statins in the past, and does not want to start them at this time.  She continues to smoke 10 cigarettes/day, and is not ready to quit.   Past Medical History:  Diagnosis Date  . Carotid stenosis, asymptomatic   . Colitis, collagenous   . Hypertension      Past Surgical History:  Procedure Laterality Date  . APPENDECTOMY       Social History   Socioeconomic History  . Marital status: Widowed    Spouse name: Not on file  . Number of children: Not on file  . Years of education: Not on file  . Highest education level: Not on file  Occupational History  . Not on file  Social Needs  . Financial resource strain: Not on file  . Food insecurity    Worry: Not on file    Inability: Not on file  . Transportation needs    Medical: Not on file    Non-medical: Not on file  Tobacco Use  . Smoking status: Current Every Day Smoker    Packs/day: 0.50    Years: 30.00    Pack years: 15.00    Types: Cigarettes  . Smokeless tobacco: Never Used  Substance and Sexual Activity  . Alcohol use: Yes    Alcohol/week: 21.0 standard drinks    Types: 21 Glasses of wine per week  .  Drug use: No  . Sexual activity: Not on file  Lifestyle  . Physical activity    Days per week: Not on file    Minutes per session: Not on file  . Stress: Not on file  Relationships  . Social Herbalist on phone: Not on file    Gets together: Not on file    Attends religious service: Not on file    Active member of club or organization: Not on file    Attends meetings of clubs or organizations: Not on file    Relationship status: Not on file  . Intimate partner violence    Fear of current or ex partner: Not on  file    Emotionally abused: Not on file    Physically abused: Not on file    Forced sexual activity: Not on file  Other Topics Concern  . Not on file  Social History Narrative  . Not on file    Family History  Problem Relation Age of Onset  . Hypertension Mother   . Hyperlipidemia Mother   . Heart attack Mother   . Depression Mother        Bi-Polar  . Cancer Father        Lung     Current Outpatient Medications on File Prior to Visit  Medication Sig Dispense Refill  . amLODipine (NORVASC) 10 MG tablet     . azelastine (ASTELIN) 0.1 % nasal spray     . clopidogrel (PLAVIX) 75 MG tablet Take 1 tablet (75 mg total) by mouth daily. 30 tablet 3  . escitalopram (LEXAPRO) 5 MG tablet Take 20 mg by mouth daily.     . hydrochlorothiazide (HYDRODIURIL) 12.5 MG tablet TAKE 1 TABLET (12.5 MG) BY MOUTH ONE TIME  3  . megestrol (MEGACE) 40 MG tablet Take 40 mg by mouth daily. Reported on 10/02/2015    . Multiple Vitamin (MULTIVITAMIN) capsule Take 1 capsule by mouth daily.    . valACYclovir (VALTREX) 1000 MG tablet Take by mouth.     No current facility-administered medications on file prior to visit.     Cardiovascular studies:  Lexiscan Tetrofosmin Stress Test  04/28/2019: Non-diagnostic stress EKG. Mod Bruce protocol with lexiscan protocol used.  Normal myocardial perfusion. All segments of left ventricle demonstrated normal wall motion and thickening. Stress LV EF is normal 70%.  No previous exam available for comparison. Low risk study.  Carotid US 04/10/2019: Minimal stenosis in the right internal carotid artery (1-15%). Stenosis in the left internal carotid artery (50-69%). Antegrade right vertebral artery flow. Antegrade left vertebral artery flow. Compared to external report review, no significant change from 11/21/2017. Follow up in six months is appropriate if clinically indicated.  Echocardiogram 04/10/2019: Left ventricle cavity is normal in size. Normal left  ventricular wall thickness. Normal LV systolic function with EF 55%. Normal global wall motion. Diastolic function assessment limited due to degree of mitral annular calcification.  Moderate calcification of the mitral valve annulus. Mild to moderate mitral regurgitation. Moderate tricuspid regurgitation. Estimated pulmonary artery systolic pressure is 29 mmHg.  EKG 03/27/2019: Sinus rhythm 97 bpm. Biatrial enlargement. cannot exclude old anteroseptal infarct.   CT chest 03/01/2019: Two vessel coronary atherosclerosis. Aortic atherosclerosis.  Mitral annular calcification. Emphysema. Hepatic steatosis.   Carotid US 11/2017: Right Carotid: Velocities in the right ICA are consistent with a 1-39% stenosis. Left Carotid: Velocities in the left ICA are consistent with a 40-59% stenosis.  The ECA appears >50% stenosed. Vertebrals:  Bilateral vertebral arteries demonstrate antegrade flow. Subclavians: Normal flow hemodynamics were seen in bilateral subclavian arteries.   Recent labs: Not available.   Review of Systems  Constitution: Positive for malaise/fatigue. Negative for decreased appetite, weight gain and weight loss.  HENT: Negative for congestion.   Eyes: Negative for visual disturbance.  Cardiovascular: Positive for dyspnea on exertion. Negative for chest pain, leg swelling, palpitations and syncope.  Respiratory: Positive for shortness of breath. Negative for cough.   Endocrine: Negative for cold intolerance.  Hematologic/Lymphatic: Does not bruise/bleed easily.  Skin: Negative for itching and rash.  Musculoskeletal: Negative for myalgias.  Gastrointestinal: Negative for abdominal pain, nausea and vomiting.  Genitourinary: Negative for dysuria.  Neurological: Negative for dizziness and weakness.  Psychiatric/Behavioral: The patient is not nervous/anxious.   All other systems reviewed and are negative.        Vitals:   05/13/19 1041  BP: (!) 185/94   Pulse: (!) 103      Objective:   Physical Exam   Not performed. Telephone visit.      Assessment & Recommendations:   73 y.o. Caucasian female with CAD (noted on CT chest), asymptomatic carotid artery disease, COPD, tobacco abuse.  Coronary calcification: No ischemia on stress tests.  Continue aggressive medical management.  Given CAD and PAD with carotid artery disease, recommend Plavix 75 mg daily. She currently not taking this, as the medications states "do not take with alcohol". Patient drinks 3 glasses of wine everyday. I have encouraged her to cut down to no more than 1-2 glasses of wine daily. Reasonable to take plavix 75 mg daily, as long as no bleeding symptoms arise.  I do not have a recent lipid panel.  Nonetheless, she needs to be on high intensity statin given her CAD and PAD. Recommend Crestor. She has previously had intolerance to satins, but does not recollect her symptoms. Recommend starting at 10 mg daily.   Hypertension: Uncontrolled. Added coreg 6.25 mg bid.   Moderate carotid artery disease: Moderate left ICA stenosis, asymptomatic. Continue aggressive medical management and risk factor modification.  Tobacco cessation counseling: Re-emphasized the importance of quitting. She is not ready to quit yet. Will readdress at next visit.   Labs in 3 months, then f/u.  Nigel Mormon, MD [facility] Pager: [phone] Office: [phone] If no answer Cell [phone]

## 2019-05-20 DIAGNOSIS — I10 Essential (primary) hypertension: Secondary | ICD-10-CM | POA: Diagnosis not present

## 2019-05-20 DIAGNOSIS — J449 Chronic obstructive pulmonary disease, unspecified: Secondary | ICD-10-CM | POA: Diagnosis not present

## 2019-05-20 DIAGNOSIS — F17200 Nicotine dependence, unspecified, uncomplicated: Secondary | ICD-10-CM | POA: Diagnosis not present

## 2019-05-20 DIAGNOSIS — J309 Allergic rhinitis, unspecified: Secondary | ICD-10-CM | POA: Diagnosis not present

## 2019-05-20 DIAGNOSIS — I2510 Atherosclerotic heart disease of native coronary artery without angina pectoris: Secondary | ICD-10-CM | POA: Diagnosis not present

## 2019-06-14 DIAGNOSIS — I639 Cerebral infarction, unspecified: Secondary | ICD-10-CM

## 2019-06-14 HISTORY — DX: Cerebral infarction, unspecified: I63.9

## 2019-06-24 DIAGNOSIS — J449 Chronic obstructive pulmonary disease, unspecified: Secondary | ICD-10-CM | POA: Diagnosis not present

## 2019-06-24 DIAGNOSIS — I10 Essential (primary) hypertension: Secondary | ICD-10-CM | POA: Diagnosis not present

## 2019-06-24 DIAGNOSIS — J309 Allergic rhinitis, unspecified: Secondary | ICD-10-CM | POA: Diagnosis not present

## 2019-06-24 DIAGNOSIS — F17200 Nicotine dependence, unspecified, uncomplicated: Secondary | ICD-10-CM | POA: Diagnosis not present

## 2019-06-24 DIAGNOSIS — R634 Abnormal weight loss: Secondary | ICD-10-CM | POA: Diagnosis not present

## 2019-06-28 ENCOUNTER — Other Ambulatory Visit: Payer: Self-pay | Admitting: Cardiology

## 2019-06-28 DIAGNOSIS — I6523 Occlusion and stenosis of bilateral carotid arteries: Secondary | ICD-10-CM

## 2019-06-28 DIAGNOSIS — I2510 Atherosclerotic heart disease of native coronary artery without angina pectoris: Secondary | ICD-10-CM

## 2019-07-22 ENCOUNTER — Telehealth: Payer: Self-pay

## 2019-07-22 NOTE — Telephone Encounter (Signed)
Patient needs to have an ECHO done the end of March beginning of April

## 2019-08-06 ENCOUNTER — Other Ambulatory Visit: Payer: Self-pay | Admitting: Cardiology

## 2019-08-06 DIAGNOSIS — I6523 Occlusion and stenosis of bilateral carotid arteries: Secondary | ICD-10-CM

## 2019-08-06 DIAGNOSIS — I10 Essential (primary) hypertension: Secondary | ICD-10-CM

## 2019-08-09 DIAGNOSIS — I6523 Occlusion and stenosis of bilateral carotid arteries: Secondary | ICD-10-CM | POA: Diagnosis not present

## 2019-08-10 LAB — LIPID PANEL
Chol/HDL Ratio: 2.7 ratio (ref 0.0–4.4)
Cholesterol, Total: 237 mg/dL — ABNORMAL HIGH (ref 100–199)
HDL: 89 mg/dL (ref >39)
LDL Chol Calc (NIH): 130 mg/dL — ABNORMAL HIGH (ref 0–99)
Triglycerides: 103 mg/dL (ref 0–149)
VLDL Cholesterol Cal: 18 mg/dL (ref 5–40)

## 2019-08-14 ENCOUNTER — Telehealth: Payer: Medicare Other | Admitting: Cardiology

## 2019-08-14 VITALS — BP 160/90

## 2019-08-14 DIAGNOSIS — I6523 Occlusion and stenosis of bilateral carotid arteries: Secondary | ICD-10-CM

## 2019-08-14 DIAGNOSIS — I10 Essential (primary) hypertension: Secondary | ICD-10-CM | POA: Diagnosis not present

## 2019-08-14 DIAGNOSIS — I739 Peripheral vascular disease, unspecified: Secondary | ICD-10-CM | POA: Insufficient documentation

## 2019-08-14 DIAGNOSIS — E782 Mixed hyperlipidemia: Secondary | ICD-10-CM | POA: Insufficient documentation

## 2019-08-14 DIAGNOSIS — I700 Atherosclerosis of aorta: Secondary | ICD-10-CM

## 2019-08-14 DIAGNOSIS — I2510 Atherosclerotic heart disease of native coronary artery without angina pectoris: Secondary | ICD-10-CM | POA: Diagnosis not present

## 2019-08-14 MED ORDER — DILTIAZEM HCL ER COATED BEADS 120 MG PO CP24: 120.0000 mg | ORAL_CAPSULE | Freq: Every day | ORAL | 3 refills | Status: DC

## 2019-08-14 MED ORDER — ROSUVASTATIN CALCIUM 10 MG PO TABS: 10.0000 mg | ORAL_TABLET | Freq: Every day | ORAL | 3 refills | Status: DC

## 2019-08-14 MED ORDER — XARELTO 2.5 MG PO TABS: 2.5000 mg | ORAL_TABLET | Freq: Two times a day (BID) | ORAL | 2 refills | Status: DC

## 2019-08-14 NOTE — Telephone Encounter (Signed)
note in chart for pt to have test done at end of march, beginning of April. I r/s her carotid  for a date before her f/u appt. called pt to inform her of update to appts, but no answer. I have mailed this information to pt

## 2019-08-14 NOTE — Progress Notes (Signed)
Patient referred by Fanny Bien, MD for coronary artery calcification  Subjective:   Crystal Noble, female    DOB: [birth year], 74 y.o.   MRN: [id number]   I connected with the patient on 08/14/2019 by a telephone call and verified that I am speaking with the correct person using two identifiers.     I offered the patient a video enabled application for a virtual visit. Unfortunately, this could not be accomplished due to technical difficulties/lack of video enabled phone/computer. I discussed the limitations of evaluation and management by telemedicine and the availability of in person appointments. The patient expressed understanding and agreed to proceed.   This visit type was conducted due to national recommendations for restrictions regarding the COVID-19 Pandemic (e.g. social distancing).  This format is felt to be most appropriate for this patient at this time.  All issues noted in this document were discussed and addressed.  No physical exam was performed (except for noted visual exam findings with Tele health visits).  The patient has consented to conduct a Tele health visit and understands insurance will be billed.    Chief Complaint  Patient presents with  . Coronary Artery Disease  . PAD    HPI  74 y.o. Caucasian female with CAD (noted on CT chest), asymptomatic carotid artery disease, COPD, tobacco dependence.  At last appointment, I started patient on Plavix, carvedilol, and Crestor.  She is not taking either of these medications.  Plavix given her nosebleed and bruising, carvedilol because of fatigue, she did not try Crestor.  Of note, aspirin has caused her to have "some intestinal problem" in the past and she is not willing to be on it.  Blood pressure remains elevated.  She continues to smoke, and is not willing to quit at this time.  Prior workup: Walking/Lexiscan stress test showed normal EF, no ischemia or infarction.  Echocardiogram showed EF 55%,  moderate mitral annular calcification with mild to moderate mitral regurgitation, moderate tricuspid regurgitation, estimated PASP 29 mmHg. Carotid ultrasound showed moderate left ICA stenosis.  Initial consult HPI 03/27/2019: Patient lives alone, exercises on stationary bike for 20 minutes 1-2 times every week.  She denies any exertional chest pain, but has stable exertional dyspnea.  Her biggest complaint is generalized fatigue for last 3 or 4 years.  She is on amlodipine and hydrochlorothiazide for hypertension.  She has not tolerated statins in the past, and does not want to start them at this time.  She continues to smoke 10 cigarettes/day, and is not ready to quit.   Current Outpatient Medications on File Prior to Visit  Medication Sig Dispense Refill  . amLODipine (NORVASC) 10 MG tablet     . azelastine (ASTELIN) 0.1 % nasal spray     . carvedilol (COREG) 6.25 MG tablet TAKE 1 TABLET BY MOUTH TWICE A DAY 180 tablet 1  . clopidogrel (PLAVIX) 75 MG tablet TAKE 1 TABLET BY MOUTH EVERY DAY 90 tablet 1  . escitalopram (LEXAPRO) 5 MG tablet Take 20 mg by mouth daily.     . hydrochlorothiazide (HYDRODIURIL) 12.5 MG tablet TAKE 1 TABLET (12.5 MG) BY MOUTH ONE TIME  3  . megestrol (MEGACE) 40 MG tablet Take 40 mg by mouth daily. Reported on 10/02/2015    . Multiple Vitamin (MULTIVITAMIN) capsule Take 1 capsule by mouth daily.    . rosuvastatin (CRESTOR) 10 MG tablet TAKE 1 TABLET BY MOUTH EVERYDAY AT BEDTIME 90 tablet 0  . valACYclovir (VALTREX) 1000 MG  tablet Take by mouth.     No current facility-administered medications on file prior to visit.    Cardiovascular studies:  Lexiscan Tetrofosmin Stress Test  04/28/2019: Non-diagnostic stress EKG. Mod Bruce protocol with lexiscan protocol used.  Normal myocardial perfusion. All segments of left ventricle demonstrated normal wall motion and thickening. Stress LV EF is normal 70%.  No previous exam available for comparison. Low risk study.   Carotid US 04/10/2019: Minimal stenosis in the right internal carotid artery (1-15%). Stenosis in the left internal carotid artery (50-69%). Antegrade right vertebral artery flow. Antegrade left vertebral artery flow. Compared to external report review, no significant change from 11/21/2017. Follow up in six months is appropriate if clinically indicated.  Echocardiogram 04/10/2019: Left ventricle cavity is normal in size. Normal left ventricular wall thickness. Normal LV systolic function with EF 55%. Normal global wall motion. Diastolic function assessment limited due to degree of mitral annular calcification.  Moderate calcification of the mitral valve annulus. Mild to moderate mitral regurgitation. Moderate tricuspid regurgitation. Estimated pulmonary artery systolic pressure is 29 mmHg.  EKG 03/27/2019: Sinus rhythm 97 bpm. Biatrial enlargement. cannot exclude old anteroseptal infarct.   CT chest 03/01/2019: Two vessel coronary atherosclerosis. Aortic atherosclerosis.  Mitral annular calcification. Emphysema. Hepatic steatosis.   Carotid US 11/2017: Right Carotid: Velocities in the right ICA are consistent with a 1-39% stenosis. Left Carotid: Velocities in the left ICA are consistent with a 40-59% stenosis.               The ECA appears >50% stenosed. Vertebrals:  Bilateral vertebral arteries demonstrate antegrade flow. Subclavians: Normal flow hemodynamics were seen in bilateral subclavian arteries.   Recent labs: 08/09/2019: Chol 237, TG 103, HDL 89, LDL 130  Review of Systems  Cardiovascular: Negative for chest pain, dyspnea on exertion, leg swelling, palpitations and syncope.        Vitals:   08/14/19 1357  BP: (!) 160/90      Objective:   Physical Exam Not performed. Telephone visit.      Assessment & Recommendations:   74 y.o. Caucasian female with CAD (noted on CT chest), asymptomatic carotid artery disease, COPD, tobacco dependence.  Coronary  calcification: No ischemia on stress tests.  Continue aggressive medical management.   She is intolerant to aspirin, had side effects of bruising with Plavix.  She is agreed to use Xarelto 2.5 mg twice daily instead.  She understands that risk of bruising as well as bleeding remains.   She has agreed to use Crestor 10 mg daily.    Hypertension: Uncontrolled.  Intolerant to multiple medications.  Today, added diltiazem 120 mg daily.    Moderate carotid artery disease: Moderate left ICA stenosis, asymptomatic. Continue aggressive medical management and risk factor modification.  Tobacco cessation counseling: Re-emphasized the importance of quitting. She is not ready to quit yet. Will readdress at next visit.   F/u in 4 wks  Burden, MD [facility] [place] Pager: [phone] Office: [phone] If no answer Cell [phone]

## 2019-08-20 ENCOUNTER — Other Ambulatory Visit: Payer: Self-pay

## 2019-08-20 DIAGNOSIS — I739 Peripheral vascular disease, unspecified: Secondary | ICD-10-CM

## 2019-08-20 DIAGNOSIS — I10 Essential (primary) hypertension: Secondary | ICD-10-CM

## 2019-08-20 DIAGNOSIS — I6523 Occlusion and stenosis of bilateral carotid arteries: Secondary | ICD-10-CM

## 2019-08-20 DIAGNOSIS — E782 Mixed hyperlipidemia: Secondary | ICD-10-CM

## 2019-08-20 MED ORDER — XARELTO 2.5 MG PO TABS: 2.5000 mg | ORAL_TABLET | Freq: Two times a day (BID) | ORAL | 2 refills | Status: DC

## 2019-08-20 MED ORDER — ROSUVASTATIN CALCIUM 10 MG PO TABS
10.0000 mg | ORAL_TABLET | Freq: Every day | ORAL | 3 refills | Status: DC
Start: 2019-08-20 — End: 2019-09-20

## 2019-08-20 MED ORDER — DILTIAZEM HCL ER COATED BEADS 120 MG PO CP24: 120.0000 mg | ORAL_CAPSULE | Freq: Every day | ORAL | 3 refills | Status: DC

## 2019-09-06 ENCOUNTER — Ambulatory Visit: Payer: Medicare Other | Attending: Internal Medicine

## 2019-09-06 DIAGNOSIS — Z23 Encounter for immunization: Secondary | ICD-10-CM

## 2019-09-06 NOTE — Progress Notes (Signed)
   Covid-19 Vaccination Clinic  Name:  Crystal Noble    MRN: [id number] DOB: 1945-12-13  09/06/2019  [name] was observed post Covid-19 immunization for 30 minutes based on pre-vaccination screening without incident. She was provided with Vaccine Information Sheet and instruction to access the V-Safe system.   [name] was instructed to call 911 with any severe reactions post vaccine: Marland Kitchen Difficulty breathing  . Swelling of face and throat  . A fast heartbeat  . A bad rash all over body  . Dizziness and weakness   Immunizations Administered    Name Date Dose VIS Date Route   Pfizer COVID-19 Vaccine 09/06/2019 10:19 AM 0.3 mL 05/24/2019 Intramuscular   Manufacturer: [place]   Lot: [id number]   [place]: [id number]

## 2019-09-09 ENCOUNTER — Ambulatory Visit: Payer: Medicare Other

## 2019-09-09 ENCOUNTER — Other Ambulatory Visit: Payer: Self-pay

## 2019-09-09 DIAGNOSIS — I6523 Occlusion and stenosis of bilateral carotid arteries: Secondary | ICD-10-CM

## 2019-09-11 ENCOUNTER — Other Ambulatory Visit: Payer: Self-pay | Admitting: Cardiology

## 2019-09-11 DIAGNOSIS — R42 Dizziness and giddiness: Secondary | ICD-10-CM | POA: Diagnosis not present

## 2019-09-11 DIAGNOSIS — F17200 Nicotine dependence, unspecified, uncomplicated: Secondary | ICD-10-CM | POA: Diagnosis not present

## 2019-09-11 DIAGNOSIS — I10 Essential (primary) hypertension: Secondary | ICD-10-CM | POA: Diagnosis not present

## 2019-09-11 DIAGNOSIS — R634 Abnormal weight loss: Secondary | ICD-10-CM | POA: Diagnosis not present

## 2019-09-11 DIAGNOSIS — R630 Anorexia: Secondary | ICD-10-CM | POA: Diagnosis not present

## 2019-09-11 DIAGNOSIS — R64 Cachexia: Secondary | ICD-10-CM | POA: Diagnosis not present

## 2019-09-11 DIAGNOSIS — I6523 Occlusion and stenosis of bilateral carotid arteries: Secondary | ICD-10-CM

## 2019-09-20 ENCOUNTER — Ambulatory Visit: Payer: Medicare Other | Admitting: Cardiology

## 2019-09-20 ENCOUNTER — Encounter: Payer: Self-pay | Admitting: Cardiology

## 2019-09-20 ENCOUNTER — Other Ambulatory Visit: Payer: Self-pay

## 2019-09-20 VITALS — BP 140/74 | HR 102 | Temp 98.0°F | Resp 16 | Ht 60.0 in | Wt 74.8 lb

## 2019-09-20 DIAGNOSIS — I10 Essential (primary) hypertension: Secondary | ICD-10-CM | POA: Diagnosis not present

## 2019-09-20 DIAGNOSIS — E782 Mixed hyperlipidemia: Secondary | ICD-10-CM

## 2019-09-20 DIAGNOSIS — I739 Peripheral vascular disease, unspecified: Secondary | ICD-10-CM

## 2019-09-20 DIAGNOSIS — F17200 Nicotine dependence, unspecified, uncomplicated: Secondary | ICD-10-CM | POA: Diagnosis not present

## 2019-09-20 DIAGNOSIS — I700 Atherosclerosis of aorta: Secondary | ICD-10-CM | POA: Diagnosis not present

## 2019-09-20 DIAGNOSIS — I6523 Occlusion and stenosis of bilateral carotid arteries: Secondary | ICD-10-CM

## 2019-09-20 NOTE — Progress Notes (Signed)
Patient referred by Fanny Bien, MD for coronary artery calcification  Subjective:   Crystal Noble, female    DOB: April 06, 1946, 74 y.o.   MRN: [id number]   Chief Complaint  Patient presents with  . Hypertension    HPI  74 y.o. Caucasian female with CAD (noted on CT chest), asymptomatic carotid artery disease, COPD, tobacco dependence.  Patient is not willing to quit smoking. She has not tolerated, or not willing to pursue my medical recommendations. Fortunately, blood pressure is better controlled.   Prior workup: Walking/Lexiscan stress test showed normal EF, no ischemia or infarction.  Echocardiogram showed EF 55%, moderate mitral annular calcification with mild to moderate mitral regurgitation, moderate tricuspid regurgitation, estimated PASP 29 mmHg. Carotid ultrasound showed moderate left ICA stenosis.  Initial consult HPI 03/27/2019: Patient lives alone, exercises on stationary bike for 20 minutes 1-2 times every week.  She denies any exertional chest pain, but has stable exertional dyspnea.  Her biggest complaint is generalized fatigue for last 3 or 4 years.  She is on amlodipine and hydrochlorothiazide for hypertension.  She has not tolerated statins in the past, and does not want to start them at this time.  She continues to smoke 10 cigarettes/day, and is not ready to quit.   Current Outpatient Medications on File Prior to Visit  Medication Sig Dispense Refill  . amLODipine (NORVASC) 10 MG tablet     . azelastine (ASTELIN) 0.1 % nasal spray     . escitalopram (LEXAPRO) 5 MG tablet Take 20 mg by mouth daily.     . hydrochlorothiazide (HYDRODIURIL) 25 MG tablet 25 mg.   3  . megestrol (MEGACE) 40 MG tablet Take 40 mg by mouth daily. Reported on 10/02/2015    . montelukast (SINGULAIR) 10 MG tablet Take 10 mg by mouth at bedtime.    . Multiple Vitamin (MULTIVITAMIN) capsule Take 1 capsule by mouth daily.     No current facility-administered medications on file  prior to visit.    Cardiovascular studies:  EKG 09/20/2019: Sinus rhythm 98 bpm. Right atrial enlargement.  Lexiscan Tetrofosmin Stress Test  04/28/2019: Non-diagnostic stress EKG. Mod Bruce protocol with lexiscan protocol used.  Normal myocardial perfusion. All segments of left ventricle demonstrated normal wall motion and thickening. Stress LV EF is normal 70%.  No previous exam available for comparison. Low risk study.  Carotid US 04/10/2019: Minimal stenosis in the right internal carotid artery (1-15%). Stenosis in the left internal carotid artery (50-69%). Antegrade right vertebral artery flow. Antegrade left vertebral artery flow. Compared to external report review, no significant change from 11/21/2017. Follow up in six months is appropriate if clinically indicated.  Echocardiogram 04/10/2019: Left ventricle cavity is normal in size. Normal left ventricular wall thickness. Normal LV systolic function with EF 55%. Normal global wall motion. Diastolic function assessment limited due to degree of mitral annular calcification.  Moderate calcification of the mitral valve annulus. Mild to moderate mitral regurgitation. Moderate tricuspid regurgitation. Estimated pulmonary artery systolic pressure is 29 mmHg.  EKG 03/27/2019: Sinus rhythm 97 bpm. Biatrial enlargement. cannot exclude old anteroseptal infarct.   CT chest 03/01/2019: Two vessel coronary atherosclerosis. Aortic atherosclerosis.  Mitral annular calcification. Emphysema. Hepatic steatosis.   Carotid US 11/2017: Right Carotid: Velocities in the right ICA are consistent with a 1-39% stenosis. Left Carotid: Velocities in the left ICA are consistent with a 40-59% stenosis.               The ECA appears >50% stenosed.  Vertebrals:  Bilateral vertebral arteries demonstrate antegrade flow. Subclavians: Normal flow hemodynamics were seen in bilateral subclavian arteries.   Recent labs: 08/09/2019: Chol 237, TG 103, HDL  89, LDL 130  Review of Systems  Cardiovascular: Negative for chest pain, dyspnea on exertion, leg swelling, palpitations and syncope.        Vitals:   09/20/19 1023 09/20/19 1033  BP: (!) 168/78 140/74  Pulse: 99 (!) 102  Resp: 16   Temp: 98 F (36.7 C)   SpO2: 99%       Objective:   Physical Exam  Constitutional: She appears well-developed and well-nourished.  Neck: No JVD present.  Cardiovascular: Normal rate, regular rhythm and normal heart sounds. Exam reveals decreased pulses.  No murmur heard. Pulmonary/Chest: Effort normal and breath sounds normal. She has no wheezes. She has no rales.  Musculoskeletal:        General: No edema.  Nursing note and vitals reviewed.      Assessment & Recommendations:   74 y.o. Caucasian female with CAD (noted on CT chest), asymptomatic carotid artery disease, COPD, tobacco dependence.  Coronary calcification: No ischemia on stress test. Not tolerant to Aspirin, not willing to try plavix or low dose Xarelto. Not willing to try statin or any other lipid lowering agent.  Does not want to quit smoking.   Hypertension: Better controlled on amlodipine and HCTZ.  Moderate carotid artery disease: Moderate left ICA stenosis, asymptomatic.  Tobacco cessation counseling: Understands the risks, but not ready to quit.  Given that patient is not willing to follow most my medical recommendations, I have recommended that she see me only on as needed basis.    Nigel Mormon, MD [facility] [place] Pager: [phone] Office: [phone] If no answer Cell [phone]

## 2019-10-02 ENCOUNTER — Ambulatory Visit: Payer: Medicare Other | Attending: Internal Medicine

## 2019-10-02 DIAGNOSIS — Z23 Encounter for immunization: Secondary | ICD-10-CM

## 2019-10-02 NOTE — Progress Notes (Signed)
   Covid-19 Vaccination Clinic  Name:  Crystal Noble    MRN: [id number] DOB: 07-Oct-1945  10/02/2019  [name] was observed post Covid-19 immunization for 15 minutes without incident. She was provided with Vaccine Information Sheet and instruction to access the V-Safe system.   [name] was instructed to call 911 with any severe reactions post vaccine: Marland Kitchen Difficulty breathing  . Swelling of face and throat  . A fast heartbeat  . A bad rash all over body  . Dizziness and weakness   Immunizations Administered    Name Date Dose VIS Date Route   Pfizer COVID-19 Vaccine 10/02/2019  9:35 AM 0.3 mL 08/07/2018 Intramuscular   Manufacturer: [place]   Lot: [id number]   [place]: [id number]

## 2019-10-07 ENCOUNTER — Other Ambulatory Visit: Payer: Medicare Other

## 2019-10-14 ENCOUNTER — Other Ambulatory Visit: Payer: Medicare Other

## 2019-10-21 DIAGNOSIS — R634 Abnormal weight loss: Secondary | ICD-10-CM | POA: Diagnosis not present

## 2019-10-21 DIAGNOSIS — I10 Essential (primary) hypertension: Secondary | ICD-10-CM | POA: Diagnosis not present

## 2019-10-21 DIAGNOSIS — R42 Dizziness and giddiness: Secondary | ICD-10-CM | POA: Diagnosis not present

## 2019-11-19 ENCOUNTER — Ambulatory Visit: Payer: Medicare Other | Admitting: Skilled Nursing Facility1

## 2020-01-28 DIAGNOSIS — F17200 Nicotine dependence, unspecified, uncomplicated: Secondary | ICD-10-CM | POA: Diagnosis not present

## 2020-01-28 DIAGNOSIS — I10 Essential (primary) hypertension: Secondary | ICD-10-CM | POA: Diagnosis not present

## 2020-01-28 DIAGNOSIS — R64 Cachexia: Secondary | ICD-10-CM | POA: Diagnosis not present

## 2020-01-28 DIAGNOSIS — J449 Chronic obstructive pulmonary disease, unspecified: Secondary | ICD-10-CM | POA: Diagnosis not present

## 2020-01-28 DIAGNOSIS — F331 Major depressive disorder, recurrent, moderate: Secondary | ICD-10-CM | POA: Diagnosis not present

## 2020-01-28 DIAGNOSIS — F411 Generalized anxiety disorder: Secondary | ICD-10-CM | POA: Diagnosis not present

## 2020-01-28 DIAGNOSIS — I779 Disorder of arteries and arterioles, unspecified: Secondary | ICD-10-CM | POA: Diagnosis not present

## 2020-01-28 DIAGNOSIS — G4700 Insomnia, unspecified: Secondary | ICD-10-CM | POA: Diagnosis not present

## 2020-03-13 DIAGNOSIS — Z1159 Encounter for screening for other viral diseases: Secondary | ICD-10-CM | POA: Diagnosis not present

## 2020-03-13 DIAGNOSIS — Z0000 Encounter for general adult medical examination without abnormal findings: Secondary | ICD-10-CM | POA: Diagnosis not present

## 2020-03-17 DIAGNOSIS — R64 Cachexia: Secondary | ICD-10-CM | POA: Diagnosis not present

## 2020-03-17 DIAGNOSIS — Z23 Encounter for immunization: Secondary | ICD-10-CM | POA: Diagnosis not present

## 2020-03-17 DIAGNOSIS — R945 Abnormal results of liver function studies: Secondary | ICD-10-CM | POA: Diagnosis not present

## 2020-03-17 DIAGNOSIS — Z1331 Encounter for screening for depression: Secondary | ICD-10-CM | POA: Diagnosis not present

## 2020-03-17 DIAGNOSIS — K52831 Collagenous colitis: Secondary | ICD-10-CM | POA: Diagnosis not present

## 2020-03-17 DIAGNOSIS — I779 Disorder of arteries and arterioles, unspecified: Secondary | ICD-10-CM | POA: Diagnosis not present

## 2020-03-17 DIAGNOSIS — Z0000 Encounter for general adult medical examination without abnormal findings: Secondary | ICD-10-CM | POA: Diagnosis not present

## 2020-03-17 DIAGNOSIS — E559 Vitamin D deficiency, unspecified: Secondary | ICD-10-CM | POA: Diagnosis not present

## 2020-03-17 DIAGNOSIS — F17200 Nicotine dependence, unspecified, uncomplicated: Secondary | ICD-10-CM | POA: Diagnosis not present

## 2020-03-17 DIAGNOSIS — I2510 Atherosclerotic heart disease of native coronary artery without angina pectoris: Secondary | ICD-10-CM | POA: Diagnosis not present

## 2020-03-17 DIAGNOSIS — J449 Chronic obstructive pulmonary disease, unspecified: Secondary | ICD-10-CM | POA: Diagnosis not present

## 2020-03-17 DIAGNOSIS — Z1339 Encounter for screening examination for other mental health and behavioral disorders: Secondary | ICD-10-CM | POA: Diagnosis not present

## 2020-03-24 DIAGNOSIS — F411 Generalized anxiety disorder: Secondary | ICD-10-CM | POA: Diagnosis not present

## 2020-03-24 DIAGNOSIS — R42 Dizziness and giddiness: Secondary | ICD-10-CM | POA: Diagnosis not present

## 2020-03-24 DIAGNOSIS — R110 Nausea: Secondary | ICD-10-CM | POA: Diagnosis not present

## 2020-03-24 DIAGNOSIS — I10 Essential (primary) hypertension: Secondary | ICD-10-CM | POA: Diagnosis not present

## 2020-03-25 ENCOUNTER — Other Ambulatory Visit: Payer: Self-pay | Admitting: Family Medicine

## 2020-03-25 DIAGNOSIS — R42 Dizziness and giddiness: Secondary | ICD-10-CM

## 2020-03-26 ENCOUNTER — Ambulatory Visit
Admission: RE | Admit: 2020-03-26 | Discharge: 2020-03-26 | Disposition: A | Discharge status: Home / Self Care | Payer: Medicare Other | Source: Ambulatory Visit | Attending: Family Medicine | Admitting: Family Medicine

## 2020-03-26 DIAGNOSIS — R42 Dizziness and giddiness: Secondary | ICD-10-CM | POA: Diagnosis not present

## 2020-03-26 DIAGNOSIS — S0003XA Contusion of scalp, initial encounter: Secondary | ICD-10-CM | POA: Diagnosis not present

## 2020-03-26 DIAGNOSIS — I6782 Cerebral ischemia: Secondary | ICD-10-CM | POA: Diagnosis not present

## 2020-03-26 DIAGNOSIS — G319 Degenerative disease of nervous system, unspecified: Secondary | ICD-10-CM | POA: Diagnosis not present

## 2020-03-26 IMAGING — CT CT HEAD W/O CM
3 of 4 series · 14 of 47 positions shown, 16 images · non-contrast
Comparison: No pertinent prior exams are available for comparison.

CLINICAL DATA: Dizziness. Additional history provided by scanning
technologist: Patient reports fall (hitting back of head) 2 days
ago, nausea and dizziness.

EXAM:
CT HEAD WITHOUT CONTRAST
TECHNIQUE: Contiguous axial images were obtained from the base of the skull
through the vertex without intravenous contrast.

[Series 2: head 5.00 hr40 s3 axial ibhc · axial · 0.46mm/px · z∈[-830,-690]mm · 8 of 34 slices shown, 10 images]
[im 3/34  brain]
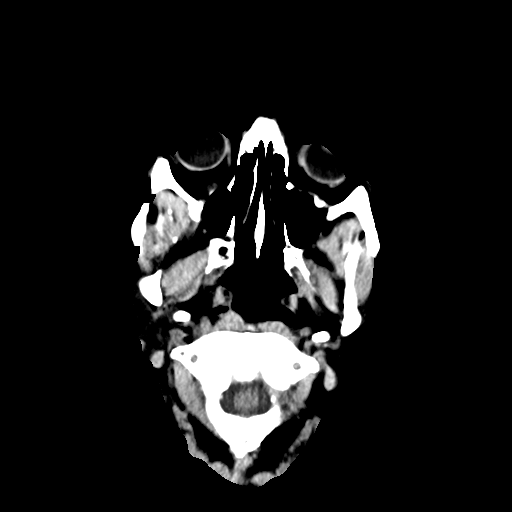
[im 3/34  bone]
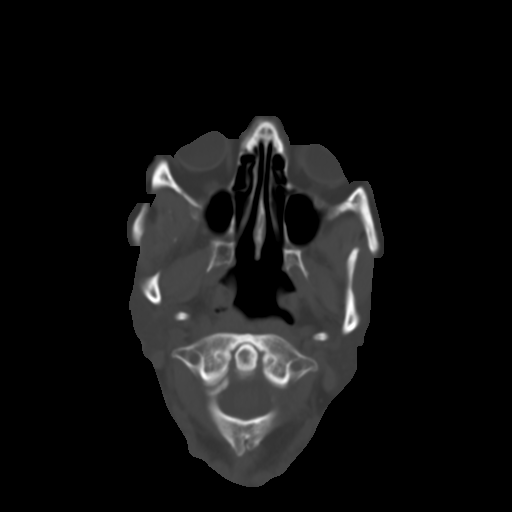
[im 8/34  brain]
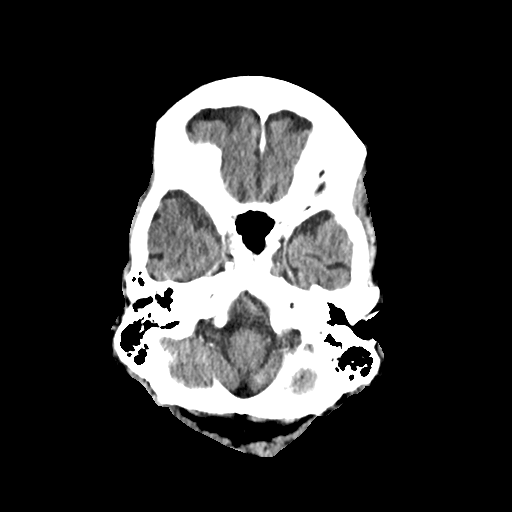
[im 12/34  brain]
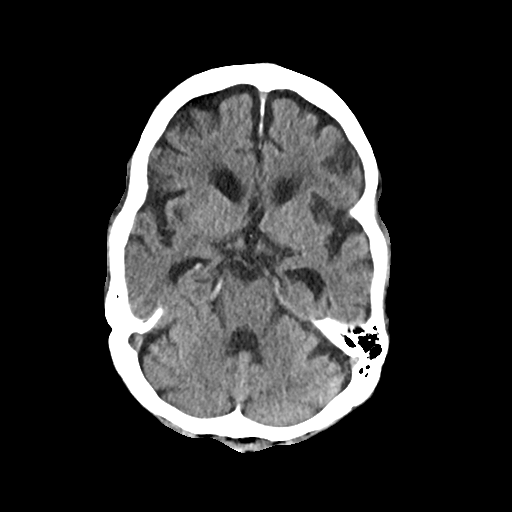
[im 15/34  brain]
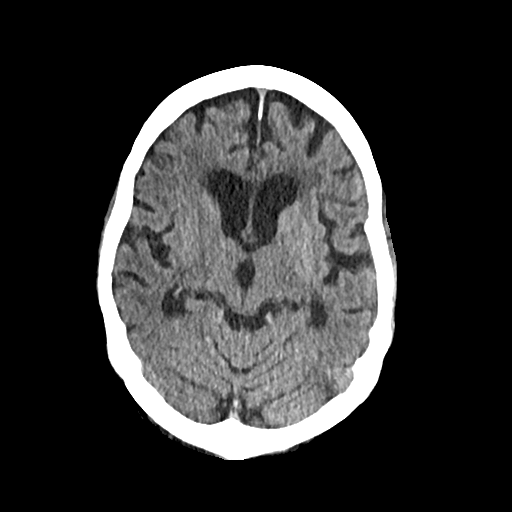
[im 19/34  brain]
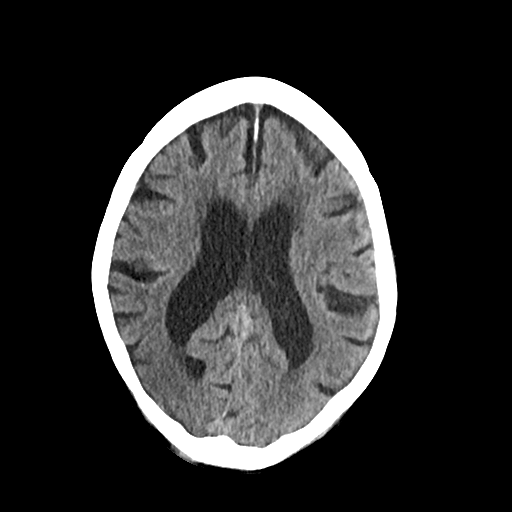
[im 19/34  bone]
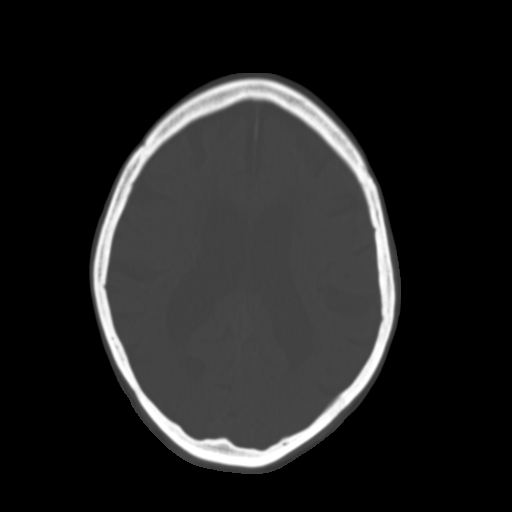
[im 22/34  brain]
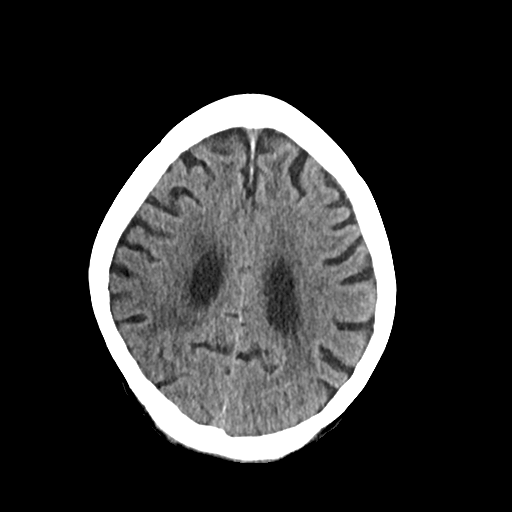
[im 26/34  brain]
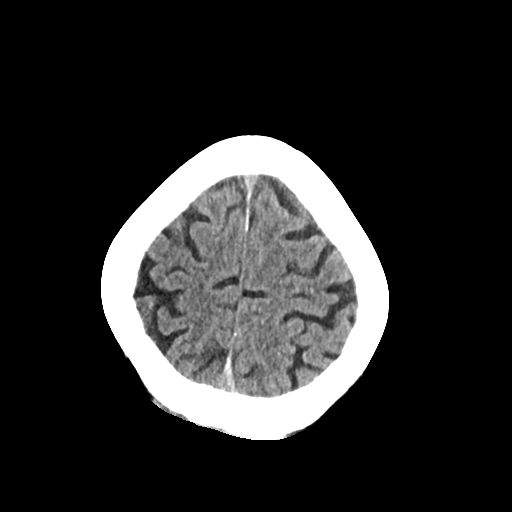
[im 31/34  brain]
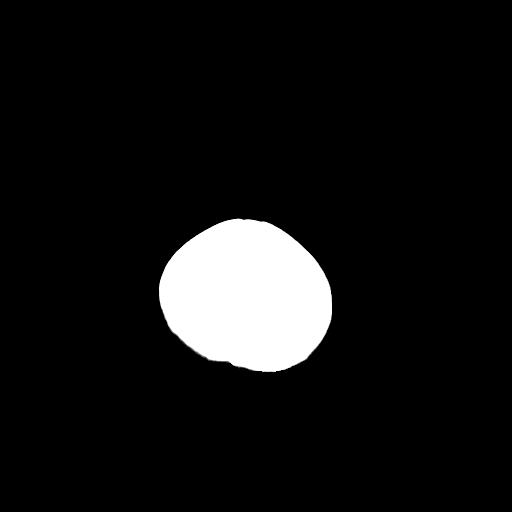

[Series 4: head 3.00 hr40 s3 sag · sagittal · 0.34mm/px · 3 of 78 slices shown]
[im 26/78  brain]
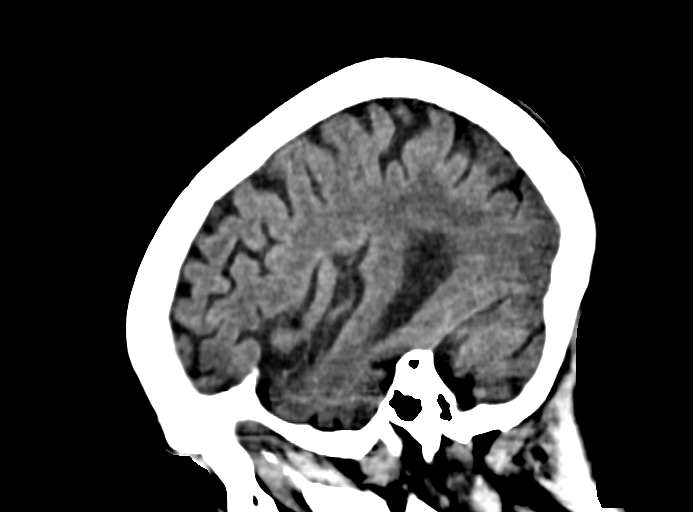
[im 39/78  brain]
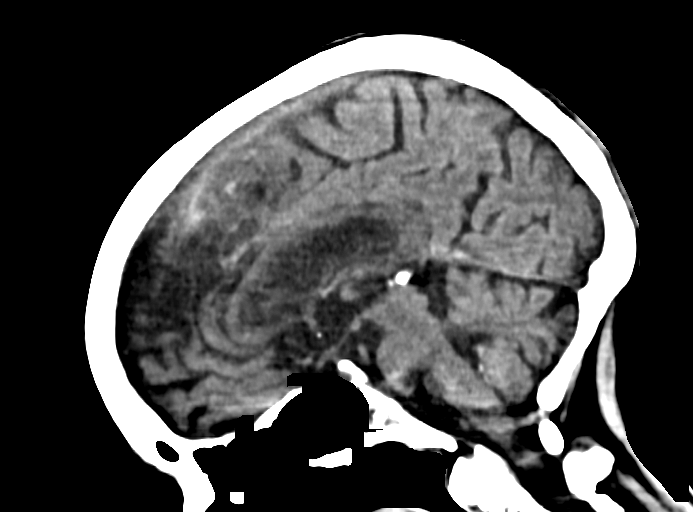
[im 52/78  brain]
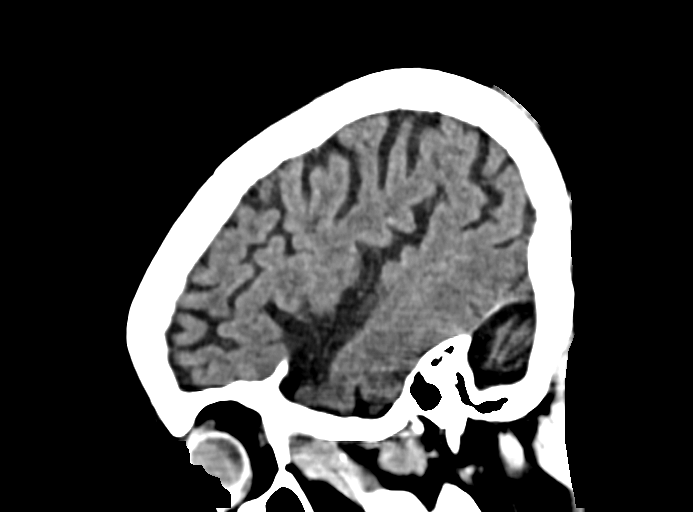

[Series 6: head 3.00 hr40 s3 cor · coronal · 0.34mm/px · 3 of 78 slices shown]
[im 26/78  brain]
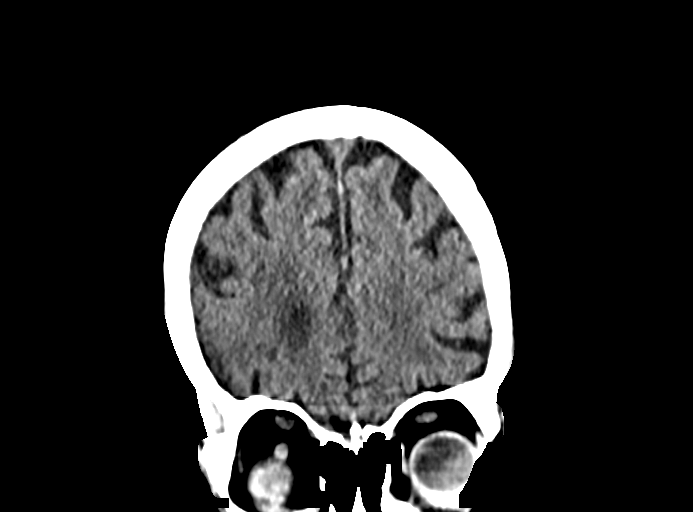
[im 35/78  brain]
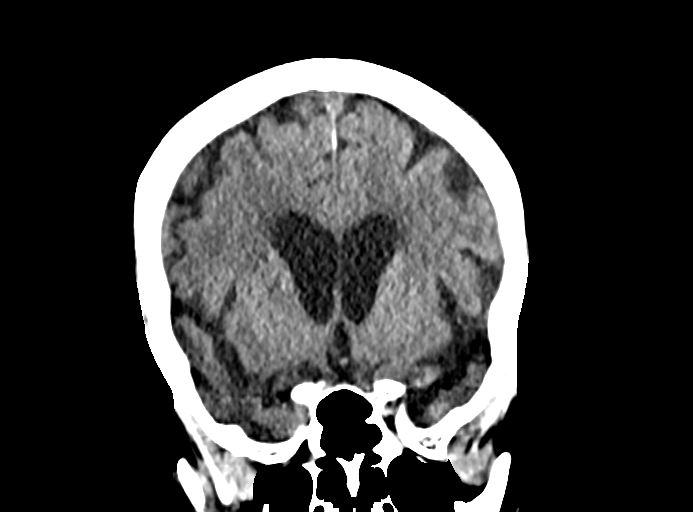
[im 43/78  brain]
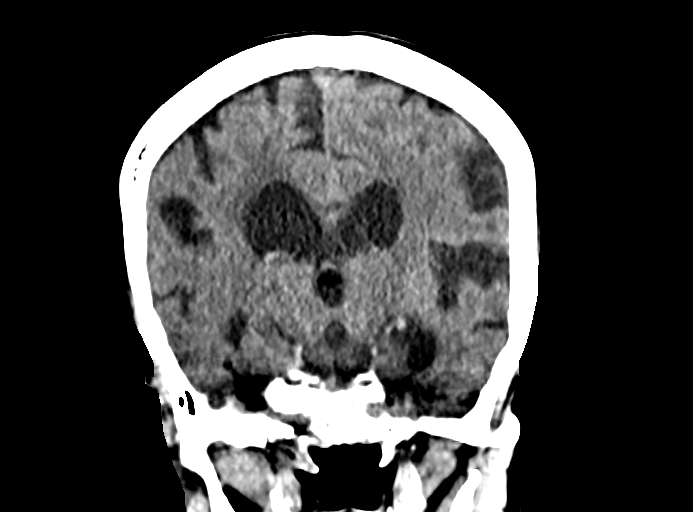

[14 of 47 positions shown; findings below may reference images not displayed]

FINDINGS: Brain:

Moderate generalized cerebral atrophy.

Mild ill-defined hypoattenuation within the cerebral white matter is
nonspecific, but consistent with chronic small vessel ischemic
disease.

There is no acute intracranial hemorrhage.

No demarcated cortical infarct.

No extra-axial fluid collection.

No evidence of intracranial mass.

No midline shift.

Vascular: No hyperdense vessel.  Atherosclerotic calcifications.

Skull: Normal. Negative for fracture or focal lesion.

Sinuses/Orbits: Visualized orbits show no acute finding. No
significant paranasal sinus disease or mastoid effusion at the
imaged levels.

Other: Posterior scalp hematoma.
IMPRESSION: No evidence of acute intracranial abnormality.

Posterior scalp hematoma.

Moderate generalized cerebral atrophy with mild chronic small vessel
ischemic disease.

## 2020-03-31 DIAGNOSIS — S0091XA Abrasion of unspecified part of head, initial encounter: Secondary | ICD-10-CM | POA: Diagnosis not present

## 2020-03-31 DIAGNOSIS — R42 Dizziness and giddiness: Secondary | ICD-10-CM | POA: Diagnosis not present

## 2020-03-31 DIAGNOSIS — I10 Essential (primary) hypertension: Secondary | ICD-10-CM | POA: Diagnosis not present

## 2020-04-06 DIAGNOSIS — I10 Essential (primary) hypertension: Secondary | ICD-10-CM | POA: Diagnosis not present

## 2020-04-14 DIAGNOSIS — F411 Generalized anxiety disorder: Secondary | ICD-10-CM | POA: Diagnosis not present

## 2020-04-14 DIAGNOSIS — I10 Essential (primary) hypertension: Secondary | ICD-10-CM | POA: Diagnosis not present

## 2020-04-14 DIAGNOSIS — G4700 Insomnia, unspecified: Secondary | ICD-10-CM | POA: Diagnosis not present

## 2020-04-14 DIAGNOSIS — F331 Major depressive disorder, recurrent, moderate: Secondary | ICD-10-CM | POA: Diagnosis not present

## 2020-05-25 DIAGNOSIS — E1169 Type 2 diabetes mellitus with other specified complication: Secondary | ICD-10-CM | POA: Diagnosis not present

## 2020-05-25 DIAGNOSIS — E782 Mixed hyperlipidemia: Secondary | ICD-10-CM | POA: Diagnosis not present

## 2020-05-25 DIAGNOSIS — F411 Generalized anxiety disorder: Secondary | ICD-10-CM | POA: Diagnosis not present

## 2020-05-25 DIAGNOSIS — G4700 Insomnia, unspecified: Secondary | ICD-10-CM | POA: Diagnosis not present

## 2020-05-25 DIAGNOSIS — R42 Dizziness and giddiness: Secondary | ICD-10-CM | POA: Diagnosis not present

## 2020-05-25 DIAGNOSIS — R636 Underweight: Secondary | ICD-10-CM | POA: Diagnosis not present

## 2020-05-25 DIAGNOSIS — I779 Disorder of arteries and arterioles, unspecified: Secondary | ICD-10-CM | POA: Diagnosis not present

## 2020-05-25 DIAGNOSIS — F331 Major depressive disorder, recurrent, moderate: Secondary | ICD-10-CM | POA: Diagnosis not present

## 2020-05-25 DIAGNOSIS — I10 Essential (primary) hypertension: Secondary | ICD-10-CM | POA: Diagnosis not present

## 2020-06-16 ENCOUNTER — Other Ambulatory Visit: Payer: Self-pay | Admitting: Family Medicine

## 2020-06-16 DIAGNOSIS — R64 Cachexia: Secondary | ICD-10-CM | POA: Diagnosis not present

## 2020-06-16 DIAGNOSIS — R42 Dizziness and giddiness: Secondary | ICD-10-CM | POA: Diagnosis not present

## 2020-06-16 DIAGNOSIS — R270 Ataxia, unspecified: Secondary | ICD-10-CM

## 2020-06-16 DIAGNOSIS — R5383 Other fatigue: Secondary | ICD-10-CM | POA: Diagnosis not present

## 2020-06-16 DIAGNOSIS — I10 Essential (primary) hypertension: Secondary | ICD-10-CM | POA: Diagnosis not present

## 2020-06-16 DIAGNOSIS — E46 Unspecified protein-calorie malnutrition: Secondary | ICD-10-CM | POA: Diagnosis not present

## 2020-06-16 DIAGNOSIS — F411 Generalized anxiety disorder: Secondary | ICD-10-CM | POA: Diagnosis not present

## 2020-06-17 ENCOUNTER — Inpatient Hospital Stay: Admission: RE | Admit: 2020-06-17 | Payer: Medicare Other | Source: Ambulatory Visit

## 2020-06-18 ENCOUNTER — Emergency Department (HOSPITAL_COMMUNITY): Payer: Medicare Other

## 2020-06-18 ENCOUNTER — Emergency Department (HOSPITAL_COMMUNITY)
Admission: EM | Admit: 2020-06-18 | Discharge: 2020-06-18 | Disposition: A | Discharge status: Home / Self Care | Payer: Medicare Other | Attending: Emergency Medicine | Admitting: Emergency Medicine

## 2020-06-18 ENCOUNTER — Encounter (HOSPITAL_COMMUNITY): Payer: Self-pay | Admitting: Emergency Medicine

## 2020-06-18 DIAGNOSIS — N3000 Acute cystitis without hematuria: Secondary | ICD-10-CM | POA: Insufficient documentation

## 2020-06-18 DIAGNOSIS — I639 Cerebral infarction, unspecified: Secondary | ICD-10-CM

## 2020-06-18 DIAGNOSIS — R42 Dizziness and giddiness: Secondary | ICD-10-CM

## 2020-06-18 DIAGNOSIS — I160 Hypertensive urgency: Secondary | ICD-10-CM | POA: Diagnosis not present

## 2020-06-18 DIAGNOSIS — I6523 Occlusion and stenosis of bilateral carotid arteries: Secondary | ICD-10-CM | POA: Diagnosis not present

## 2020-06-18 DIAGNOSIS — S0083XA Contusion of other part of head, initial encounter: Secondary | ICD-10-CM | POA: Diagnosis not present

## 2020-06-18 DIAGNOSIS — R030 Elevated blood-pressure reading, without diagnosis of hypertension: Secondary | ICD-10-CM

## 2020-06-18 DIAGNOSIS — Z743 Need for continuous supervision: Secondary | ICD-10-CM | POA: Diagnosis not present

## 2020-06-18 DIAGNOSIS — I2510 Atherosclerotic heart disease of native coronary artery without angina pectoris: Secondary | ICD-10-CM | POA: Diagnosis not present

## 2020-06-18 DIAGNOSIS — R270 Ataxia, unspecified: Secondary | ICD-10-CM

## 2020-06-18 DIAGNOSIS — S022XXA Fracture of nasal bones, initial encounter for closed fracture: Secondary | ICD-10-CM | POA: Diagnosis not present

## 2020-06-18 DIAGNOSIS — R64 Cachexia: Secondary | ICD-10-CM | POA: Diagnosis not present

## 2020-06-18 DIAGNOSIS — E46 Unspecified protein-calorie malnutrition: Secondary | ICD-10-CM | POA: Diagnosis not present

## 2020-06-18 DIAGNOSIS — Z79899 Other long term (current) drug therapy: Secondary | ICD-10-CM | POA: Diagnosis not present

## 2020-06-18 DIAGNOSIS — I10 Essential (primary) hypertension: Secondary | ICD-10-CM | POA: Insufficient documentation

## 2020-06-18 DIAGNOSIS — F17210 Nicotine dependence, cigarettes, uncomplicated: Secondary | ICD-10-CM | POA: Insufficient documentation

## 2020-06-18 DIAGNOSIS — R404 Transient alteration of awareness: Secondary | ICD-10-CM | POA: Diagnosis not present

## 2020-06-18 DIAGNOSIS — W19XXXA Unspecified fall, initial encounter: Secondary | ICD-10-CM | POA: Diagnosis not present

## 2020-06-18 LAB — URINALYSIS, ROUTINE W REFLEX MICROSCOPIC
Bilirubin Urine: NEGATIVE
Glucose, UA: NEGATIVE mg/dL
Ketones, ur: 5 mg/dL — AB
Leukocytes,Ua: NEGATIVE
Nitrite: POSITIVE — AB
Protein, ur: 30 mg/dL — AB
Specific Gravity, Urine: >1.046 — ABNORMAL HIGH (ref 1.005–1.030)
pH: 5 (ref 5.0–8.0)

## 2020-06-18 LAB — CBC
HCT: 38.3 % (ref 36.0–46.0)
Hemoglobin: 13.1 g/dL (ref 12.0–15.0)
MCH: 36.1 pg — ABNORMAL HIGH (ref 26.0–34.0)
MCHC: 34.2 g/dL (ref 30.0–36.0)
MCV: 105.5 fL — ABNORMAL HIGH (ref 80.0–100.0)
Platelets: 224 10*3/uL (ref 150–400)
RBC: 3.63 MIL/uL — ABNORMAL LOW (ref 3.87–5.11)
RDW: 13.1 % (ref 11.5–15.5)
WBC: 5.7 10*3/uL (ref 4.0–10.5)
nRBC: 0 % (ref 0.0–0.2)

## 2020-06-18 LAB — PROTIME-INR
INR: 1 (ref 0.8–1.2)
Prothrombin Time: 12.5 seconds (ref 11.4–15.2)

## 2020-06-18 LAB — HEPATIC FUNCTION PANEL
ALT: 20 U/L (ref 0–44)
AST: 43 U/L — ABNORMAL HIGH (ref 15–41)
Albumin: 4.1 g/dL (ref 3.5–5.0)
Alkaline Phosphatase: 71 U/L (ref 38–126)
Bilirubin, Direct: 0.2 mg/dL (ref 0.0–0.2)
Indirect Bilirubin: 0.7 mg/dL (ref 0.3–0.9)
Total Bilirubin: 0.9 mg/dL (ref 0.3–1.2)
Total Protein: 6.8 g/dL (ref 6.5–8.1)

## 2020-06-18 LAB — I-STAT CHEM 8, ED
BUN: 23 mg/dL (ref 8–23)
Calcium, Ion: 1.1 mmol/L — ABNORMAL LOW (ref 1.15–1.40)
Chloride: 101 mmol/L (ref 98–111)
Creatinine, Ser: 0.9 mg/dL (ref 0.44–1.00)
Glucose, Bld: 75 mg/dL (ref 70–99)
HCT: 40 % (ref 36.0–46.0)
Hemoglobin: 13.6 g/dL (ref 12.0–15.0)
Potassium: 3.3 mmol/L — ABNORMAL LOW (ref 3.5–5.1)
Sodium: 139 mmol/L (ref 135–145)
TCO2: 26 mmol/L (ref 22–32)

## 2020-06-18 LAB — TROPONIN I (HIGH SENSITIVITY)
Troponin I (High Sensitivity): 25 ng/L — ABNORMAL HIGH (ref <18)
Troponin I (High Sensitivity): 6 ng/L (ref <18)

## 2020-06-18 LAB — TSH: TSH: 0.443 u[IU]/mL (ref 0.350–4.500)

## 2020-06-18 IMAGING — CT CT ANGIO HEAD
2 of 10 series · 8 of 33 positions shown · IV contrast (omnipaque)
Comparison: [DATE].

CLINICAL DATA: Neuro deficit, acute, stroke suspected dizziness
with falls for 2 weeks; Vertigo, central persistent dizziness for 2
weeks, BP over 200

EXAM:
CT ANGIOGRAPHY HEAD AND NECK
TECHNIQUE: Multidetector CT imaging of the head and neck was performed using
the standard protocol during bolus administration of intravenous
contrast. Multiplanar CT image reconstructions and MIPs were
obtained to evaluate the vascular anatomy. Carotid stenosis
measurements (when applicable) are obtained utilizing NASCET
criteria, using the distal internal carotid diameter as the
denominator.
CONTRAST:  100mL OMNIPAQUE IOHEXOL 350 MG/ML SOLN

[Series 8: cta head neck · axial · 0.50mm/px · z∈[-240,-126]mm · 2 of 168 slices shown]
[im 56/168  soft-tissue]
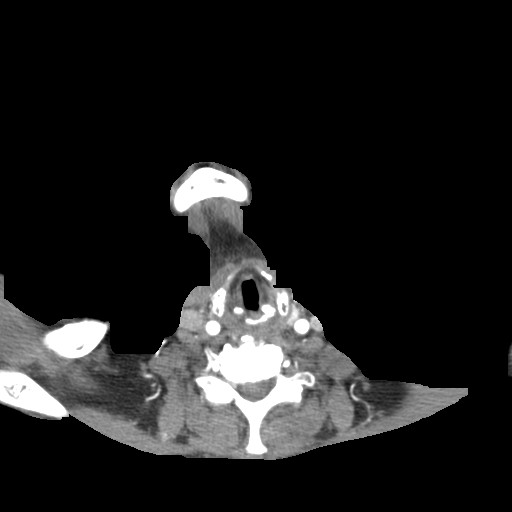
[im 112/168  soft-tissue]
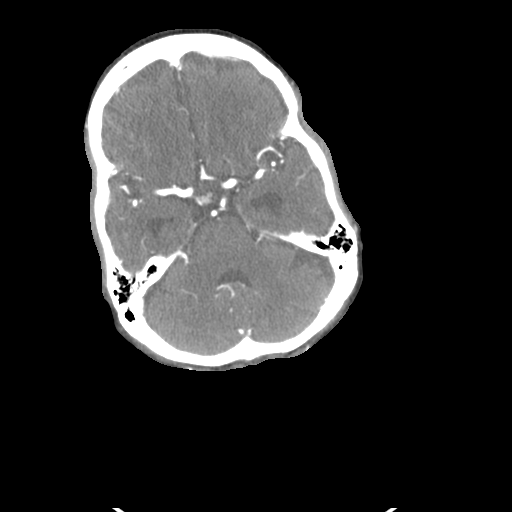

[Series 10: ax thin · axial · 0.43mm/px · z∈[-334,-94]mm · 6 of 344 slices shown]
[im 50/344  soft-tissue]
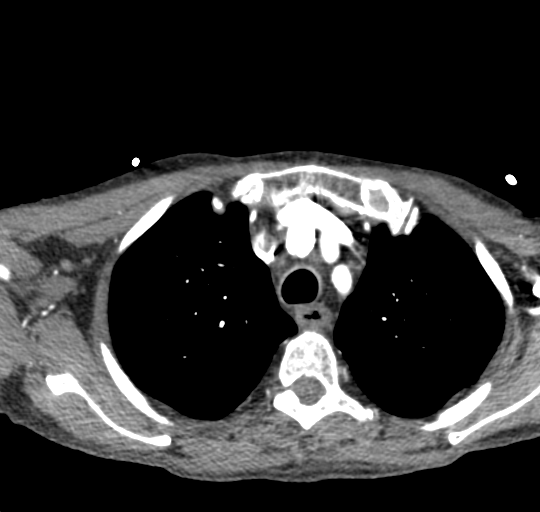
[im 99/344  bone]
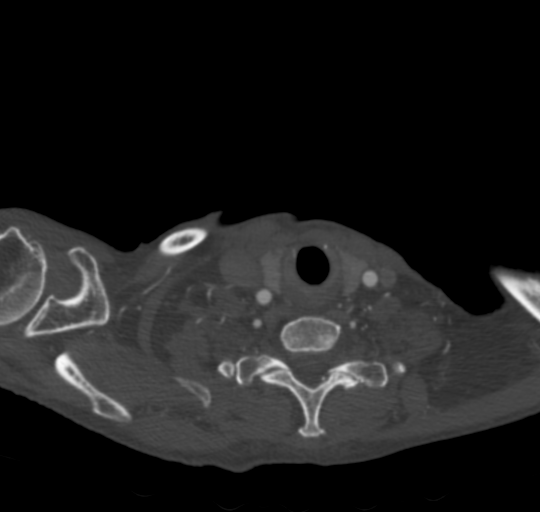
[im 148/344  soft-tissue]
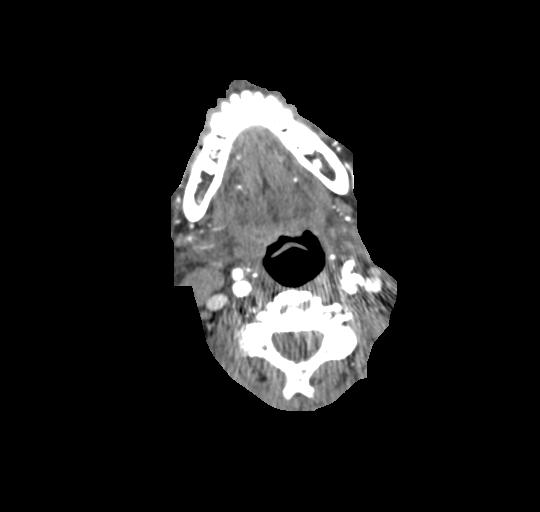
[im 197/344  bone]
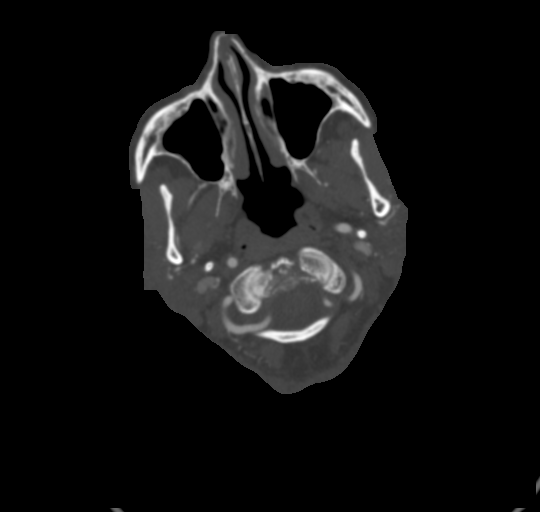
[im 246/344  soft-tissue]
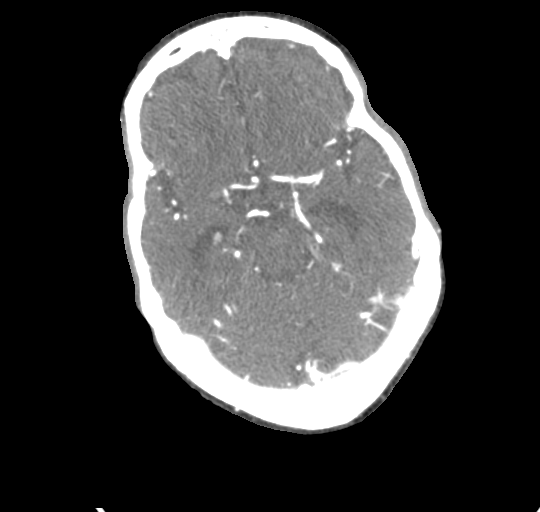
[im 295/344  bone]
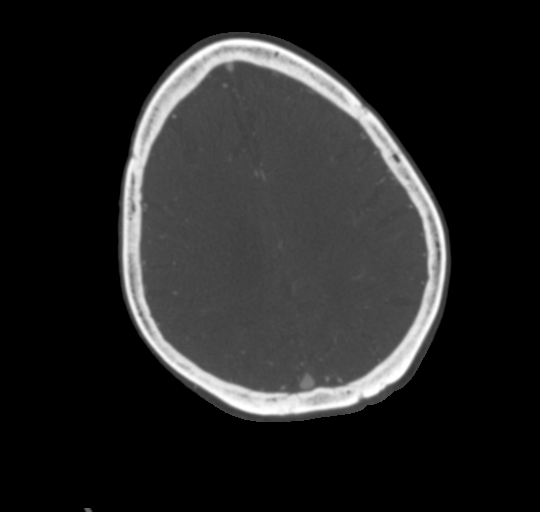

[8 of 33 positions shown; findings below may reference images not displayed]

FINDINGS: CT HEAD FINDINGS

Brain: No acute infarct or intracranial hemorrhage. No mass lesion.
No midline shift, ventriculomegaly or extra-axial fluid collection.
Moderate cerebral atrophy with ex vacuo dilatation. Chronic
microvascular ischemic changes.

Vascular: No hyperdense vessel or unexpected calcification.
Bilateral skull base atherosclerotic calcifications.

Skull: Negative for fracture or focal lesion. Minimal left parietal
scalp soft tissue swelling.

Sinuses/Orbits: Please see concurrent maxillofacial CT for better
evaluation.

Other: None.

Review of the MIP images confirms the above findings

CTA NECK FINDINGS

Aortic arch: Atherosclerotic calcifications involving the aortic
arch. Standard branching. Moderate narrowing of the proximal left
subclavian artery secondary to calcified atheromatous plaque. Mild
to moderate narrowing of the innominate artery.

Right carotid system: Patent. Bifurcation calcified atheromatous
plaque with approximately 40% narrowing of the carotid bulb. 20%
narrowing of the proximal ICA.

Left carotid system: Patent. Bifurcation calcified and noncalcified
atheromatous plaque. 80-90% narrowing of the carotid bulb/proximal
ICA.

Vertebral arteries: Dominant right vertebral artery.  Patent.

Skeleton: Multilevel spondylosis with reversal of cervical lordosis.
No acute finding.

Other neck: No acute finding.

Upper chest: Emphysema.  No acute finding.

Review of the MIP images confirms the above findings

CTA HEAD FINDINGS

Anterior circulation: Bilateral carotid siphon atherosclerotic
calcifications mild narrowing of the cavernous segments. Patent
MCAs. Right A1 segment hypoplasia. Patent ACAs.

Posterior circulation: Patent V4 segments and PICA. Patent basilar
and superior cerebellar arteries. Fetal origin of the left PCA. Mild
narrowing of the right P1 segment.

Venous sinuses: As permitted by contrast timing, patent.

Anatomic variants: Please see above.

Review of the MIP images confirms the above findings
IMPRESSION: Head CT:

No acute intracranial process. Left parietal scalp soft tissue
swelling.

Moderate cerebral atrophy and chronic microvascular ischemic
changes.

CTA neck:

80-90% narrowing of the left carotid bulb/proximal ICA.

40% narrowing of the right carotid bulb and 20% narrowing of the
proximal right ICA.

CTA head:

No large vessel occlusion or high-grade narrowing.

Mild bilateral cavernous segment and right P1 segment narrowing.

## 2020-06-18 IMAGING — MR MR HEAD W/O CM
11 series · 48 of 48 positions shown · non-contrast
Comparison: None.

CLINICAL DATA: Dizziness, recent fall

EXAM:
MRI HEAD WITHOUT CONTRAST
TECHNIQUE: Multiplanar, multiecho pulse sequences of the brain and surrounding
structures were obtained without intravenous contrast.

[Series 5: DWI · axial · 3.0mm · 1.36mm/px · z∈[-46,+93]mm · 7 of 96 slices shown (1 of 4)]
[im 1/96]
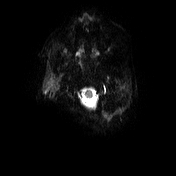
[im 16/96]
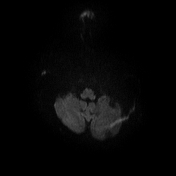
[im 32/96]
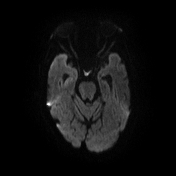
[im 48/96]
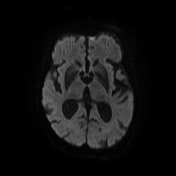
[im 64/96]
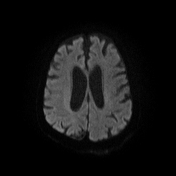
[im 80/96]
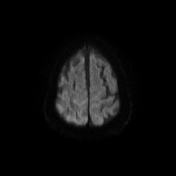
[im 96/96]
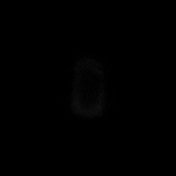

[Series 6: DWI · axial · 3.0mm · 1.36mm/px · z∈[-46,+93]mm · 3 of 47 slices shown (2 of 4)]
[im 1/47]
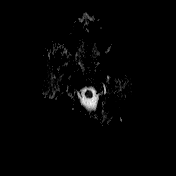
[im 24/47]
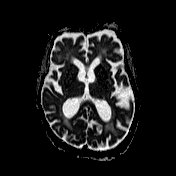
[im 47/47]
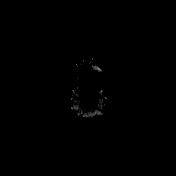

[Series 7: T2 · axial · 5.0mm · 0.62mm/px · z∈[-45,+91]mm · 2 of 22 slices shown (1 of 2)]
[im 1/22]
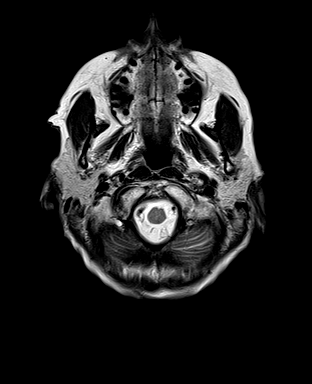
[im 22/22]
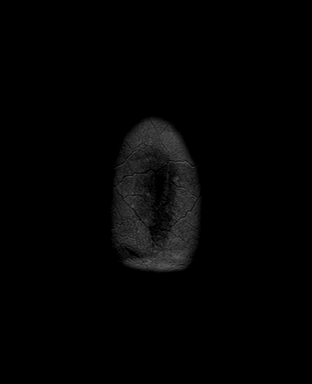

[Series 8: FLAIR · axial · 3.0mm · 0.75mm/px · z∈[-53,+99]mm · 4 of 52 slices shown]
[im 1/52]
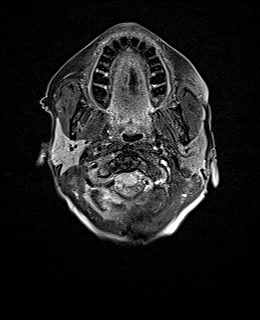
[im 18/52]
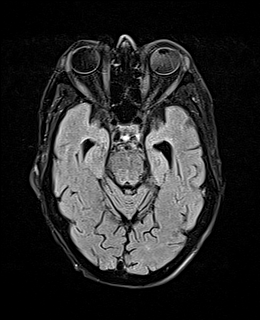
[im 35/52]
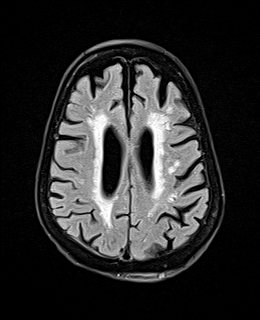
[im 52/52]
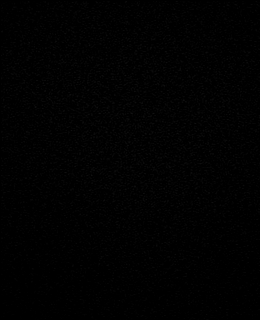

[Series 9: T1 · sagittal · 5.0mm · 0.75mm/px · 1 of 20 slices shown (1 of 2)]
[im 1/20]
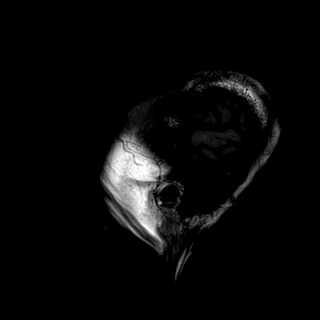

[Series 10: mip_images(sw) · axial · 24.0mm · 0.75mm/px · z∈[-48,+94]mm · 4 of 49 slices shown]
[im 1/49]
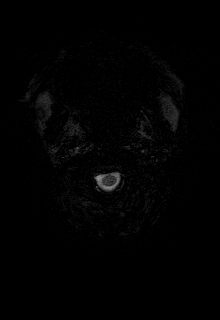
[im 17/49]
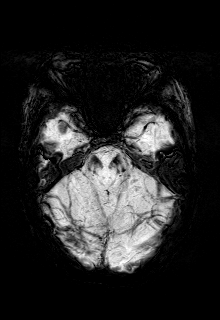
[im 33/49]
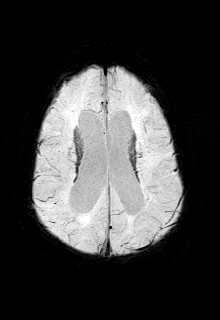
[im 49/49]
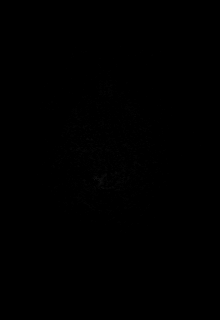

[Series 11: swi_images · axial · 3.0mm · 0.75mm/px · z∈[-59,+105]mm · 4 of 56 slices shown]
[im 1/56]
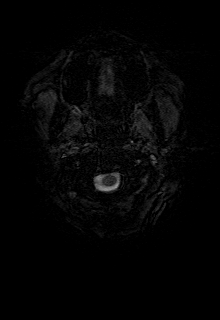
[im 19/56]
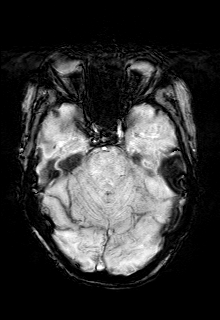
[im 37/56]
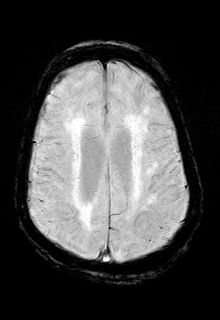
[im 56/56]
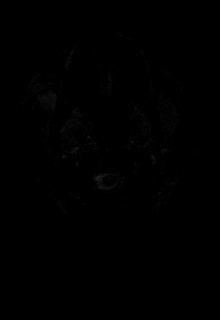

[Series 12: DWI · coronal · 3.0mm · 1.31mm/px · 7 of 100 slices shown (3 of 4)]
[im 1/100]
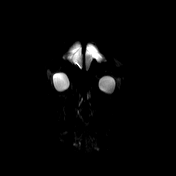
[im 17/100]
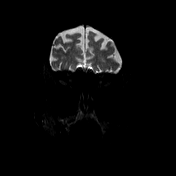
[im 34/100]
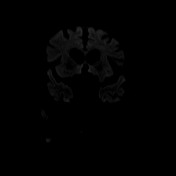
[im 50/100]
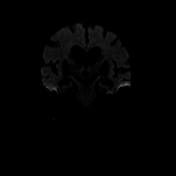
[im 67/100]
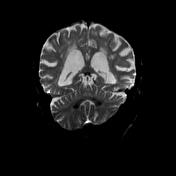
[im 83/100]
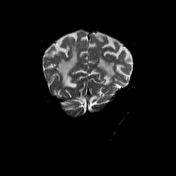
[im 100/100]
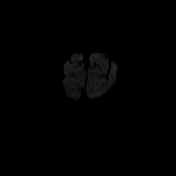

[Series 13: DWI · coronal · 3.0mm · 1.31mm/px · 4 of 50 slices shown (4 of 4)]
[im 1/50]
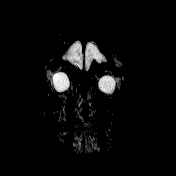
[im 17/50]
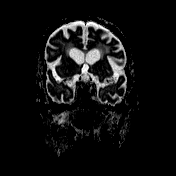
[im 33/50]
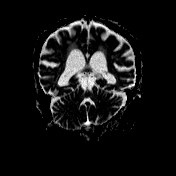
[im 50/50]
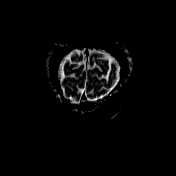

[Series 14: T1 · axial · 1.0mm · 0.94mm/px · z∈[-48,+94]mm · 10 of 144 slices shown (2 of 2)]
[im 1/144]
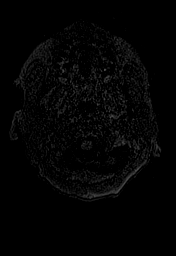
[im 16/144]
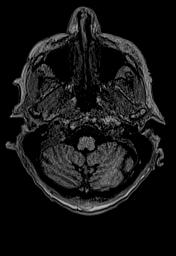
[im 32/144]
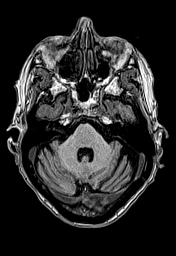
[im 48/144]
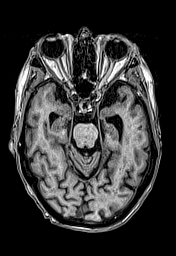
[im 64/144]
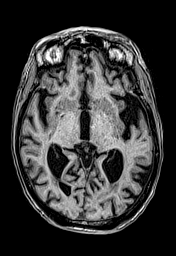
[im 80/144]
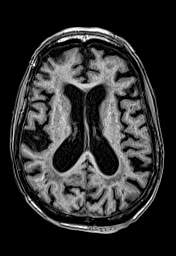
[im 96/144]
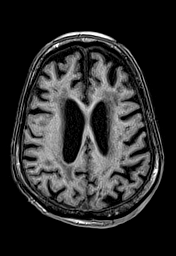
[im 112/144]
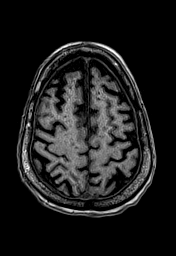
[im 128/144]
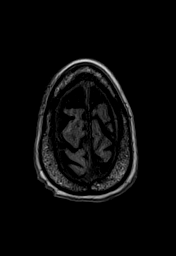
[im 144/144]
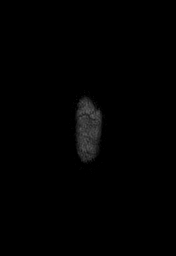

[Series 15: T2 · coronal · 5.0mm · 0.62mm/px · 2 of 32 slices shown (2 of 2)]
[im 1/32]
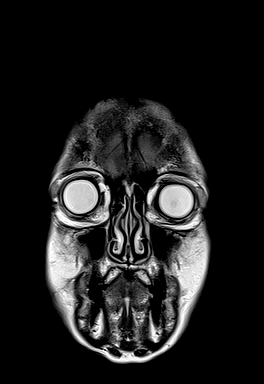
[im 32/32]
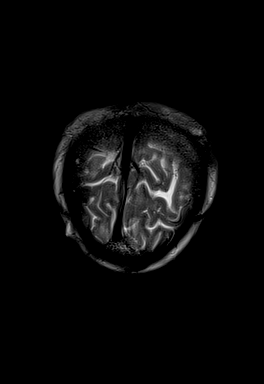

[48 of 48 positions shown; findings below may reference images not displayed]

FINDINGS: Brain: Punctate focus of mild diffusion hyperintensity with ADC iso
to hyperintensity involving the right corona radiata.

No evidence of hemorrhage. There is no intracranial mass or mass
effect. There is no hydrocephalus or extra-axial fluid collection.
Patchy and confluent areas of T2 hyperintensity in the
supratentorial white matter are nonspecific but probably reflect
moderate chronic microvascular ischemic changes. Prominence of the
ventricles and sulci reflects generalized parenchymal volume loss.

Vascular: Major vessel flow voids at the skull base are preserved.

Skull and upper cervical spine: Normal marrow signal is preserved.

Sinuses/Orbits: Paranasal sinuses are aerated. Orbits are
unremarkable.

Other: Sella is unremarkable.  Mastoid air cells are clear.
IMPRESSION: Small subacute to chronic infarct of the right corona radiata.

Moderate chronic microvascular ischemic changes.

## 2020-06-18 IMAGING — CT CT MAXILLOFACIAL W/O CM
3 series · 16 of 47 positions shown, 19 images · non-contrast
Comparison: None.

CLINICAL DATA: Facial trauma.

EXAM:
CT MAXILLOFACIAL WITHOUT CONTRAST
TECHNIQUE: Multidetector CT imaging of the maxillofacial structures was
performed. Multiplanar CT image reconstructions were also generated.

[Series 2: max soft · axial · 0.37mm/px · z∈[-250,-124]mm · 10 of 73 slices shown, 13 images]
[im 5/73  brain]
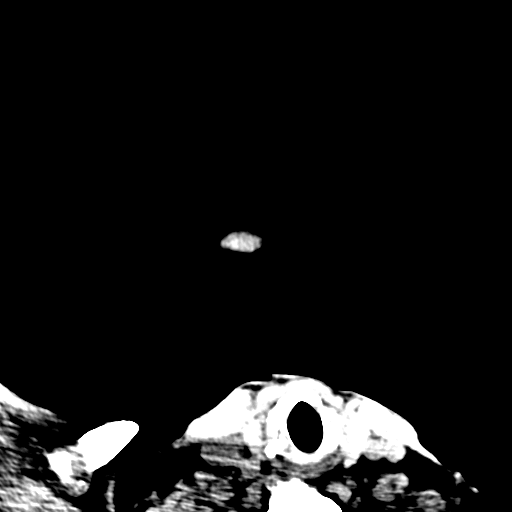
[im 5/73  bone]
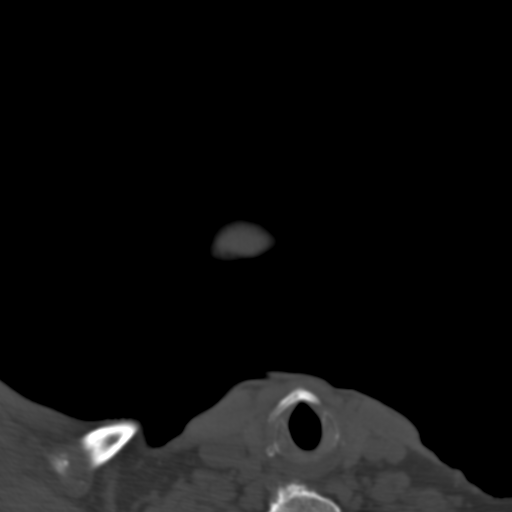
[im 13/73  bone]
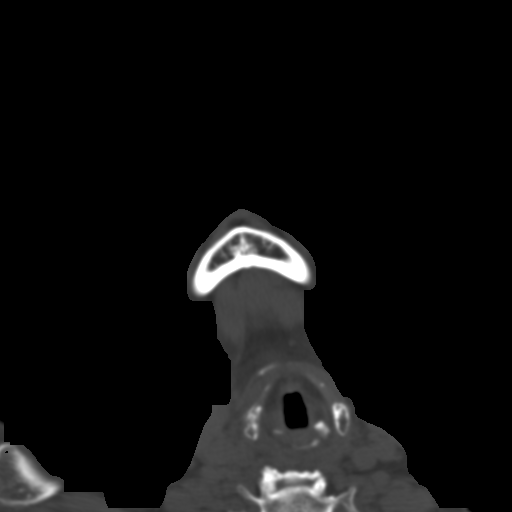
[im 20/73  bone]
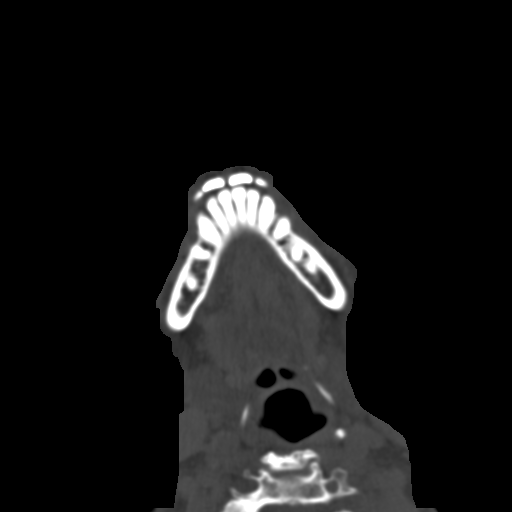
[im 25/73  bone]
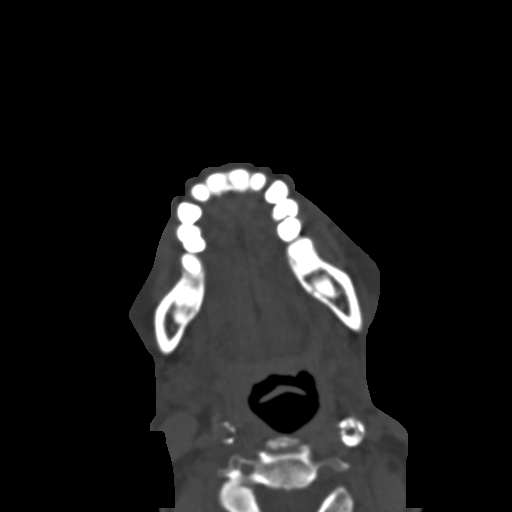
[im 33/73  brain]
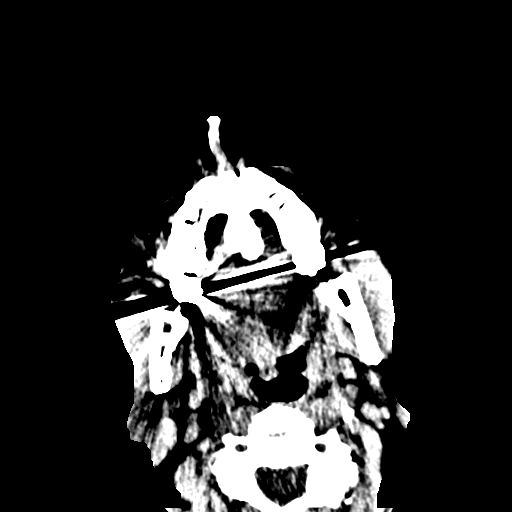
[im 33/73  bone]
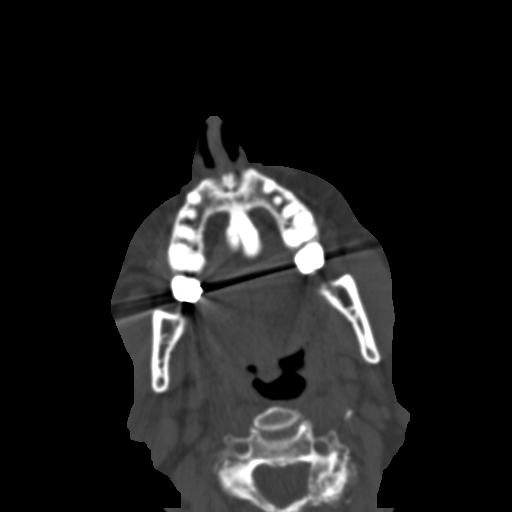
[im 40/73  bone]
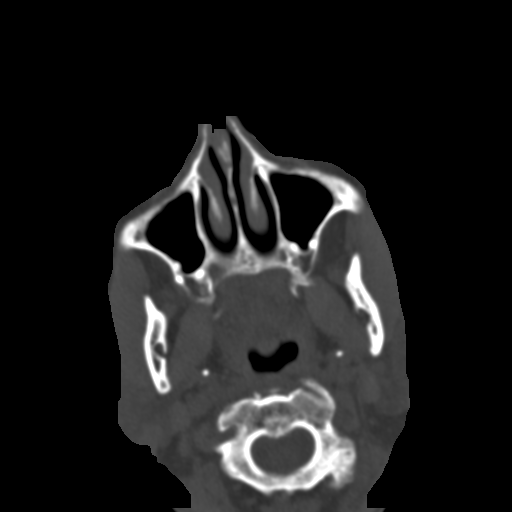
[im 48/73  bone]
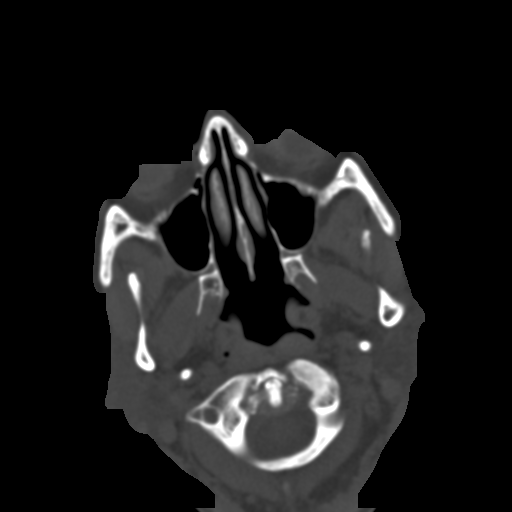
[im 55/73  bone]
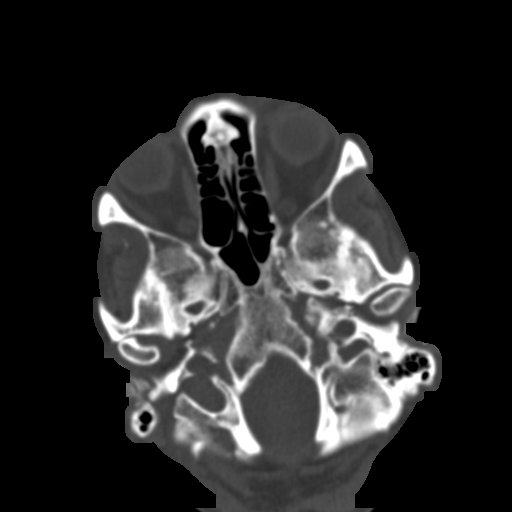
[im 60/73  brain]
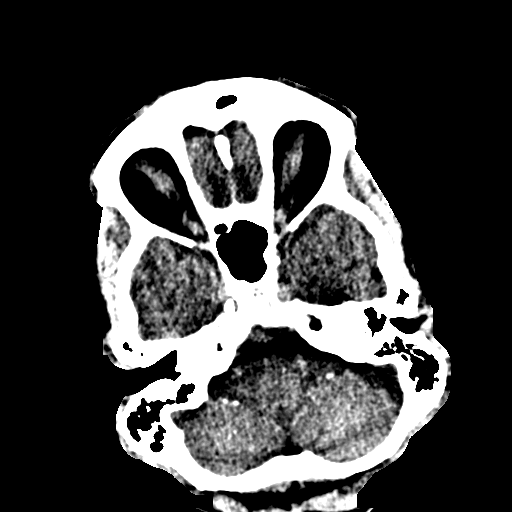
[im 60/73  bone]
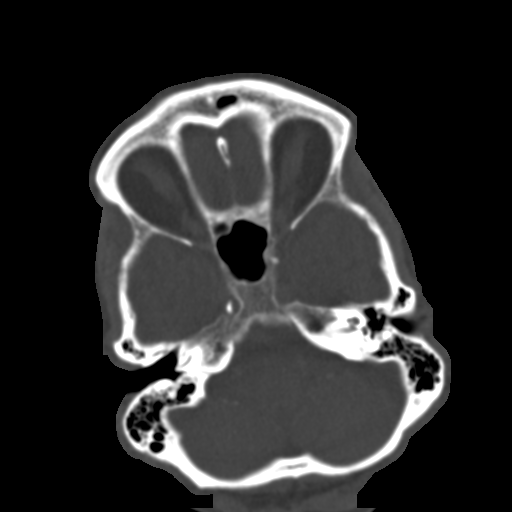
[im 68/73  bone]
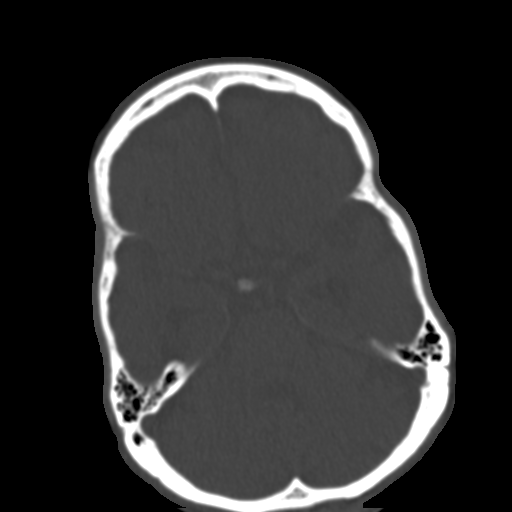

[Series 6: coronal soft · coronal · 0.36mm/px · 3 of 73 slices shown]
[im 25/73  bone]
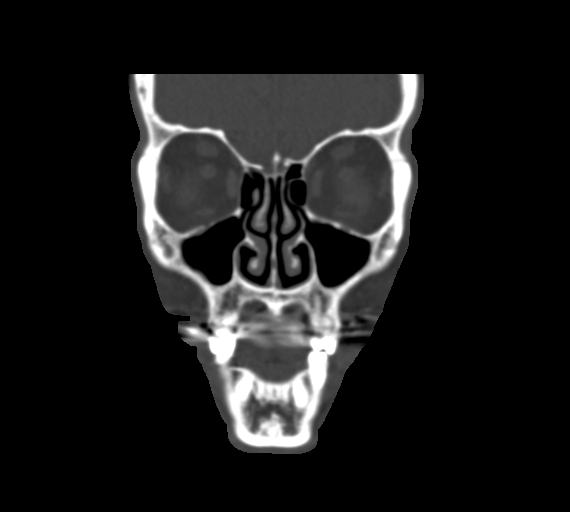
[im 33/73  bone]
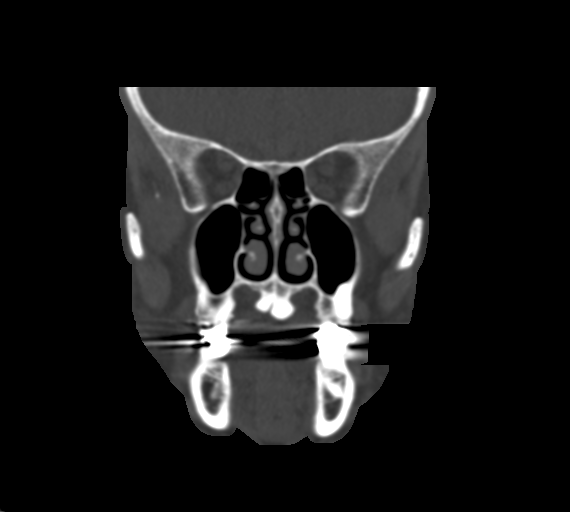
[im 41/73  bone]
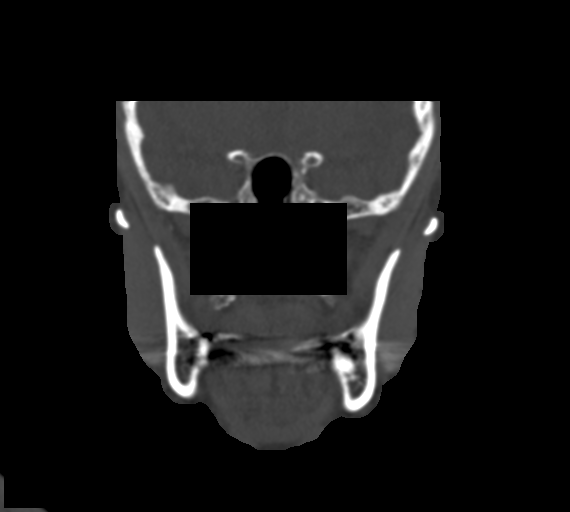

[Series 7: sagittal soft · sagittal · 0.37mm/px · 3 of 70 slices shown]
[im 24/70  bone]
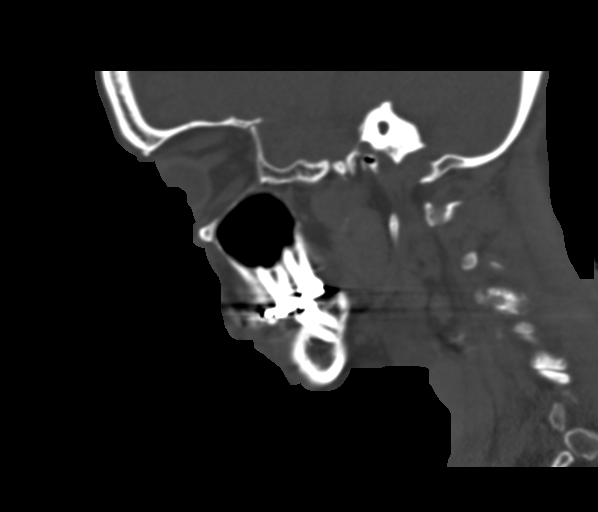
[im 35/70  bone]
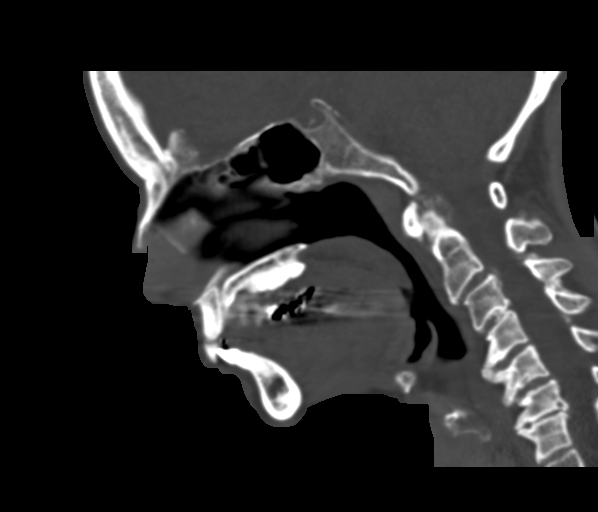
[im 47/70  bone]
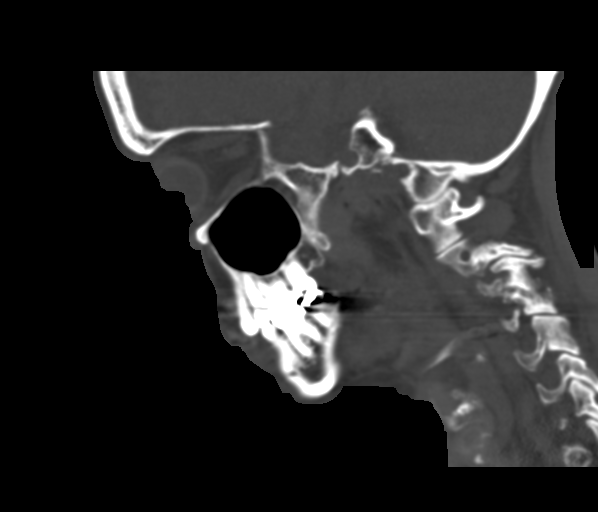

[16 of 47 positions shown; findings below may reference images not displayed]

FINDINGS: Osseous: Mildly displaced nasal bone fracture is noted. Mandible is
unremarkable.

Orbits: Negative. No traumatic or inflammatory finding.

Sinuses: Clear.

Soft tissues: Negative.

Limited intracranial: No significant or unexpected finding.
IMPRESSION: Mildly displaced nasal bone fracture.

## 2020-06-18 MED ORDER — NITROFURANTOIN MONOHYD MACRO 100 MG PO CAPS: 100.0000 mg | ORAL_CAPSULE | Freq: Two times a day (BID) | ORAL | 0 refills | Status: DC

## 2020-06-18 MED ORDER — NICOTINE 14 MG/24HR TD PT24
14.0000 mg | MEDICATED_PATCH | Freq: Once | TRANSDERMAL | Status: DC
  Filled 2020-06-18: qty 1

## 2020-06-18 MED ORDER — IOHEXOL 350 MG/ML SOLN
100.0000 mL | Freq: Once | INTRAVENOUS | Status: AC | PRN
  Administered 2020-06-18: 100 mL via INTRAVENOUS

## 2020-06-18 MED ORDER — ASPIRIN EC 81 MG PO TBEC: 81.0000 mg | DELAYED_RELEASE_TABLET | Freq: Once | ORAL | Status: DC

## 2020-06-18 NOTE — ED Notes (Signed)
Patient transported to MRI 

## 2020-06-18 NOTE — ED Notes (Signed)
CBG 122 at 1235

## 2020-06-18 NOTE — ED Provider Notes (Addendum)
[facility] DEPT Provider Note   CSN: [id number] Arrival date & time: 06/18/20  1053     History Chief Complaint  Patient presents with  . Dizziness    HTN  . Hypertension    Crystal Noble is a 75 y.o. female.  The history is provided by the patient and medical records. No language interpreter was used.  Dizziness Quality:  Room spinning Severity:  Severe Onset quality:  Gradual Duration:  2 weeks Timing:  Constant Progression:  Waxing and waning Chronicity:  New Relieved by:  Nothing Worsened by:  Nothing Ineffective treatments:  None tried Associated symptoms: headaches (intermittently but resolved now)   Associated symptoms: no chest pain, no nausea, no palpitations, no shortness of breath, no syncope, no vomiting and no weakness   Risk factors: no hx of stroke and no hx of vertigo   Hypertension Associated symptoms include headaches (intermittently but resolved now). Pertinent negatives include no chest pain, no abdominal pain and no shortness of breath.       Past Medical History:  Diagnosis Date  . Carotid stenosis, asymptomatic   . Colitis, collagenous   . Hypertension     Patient Active Problem List   Diagnosis Date Noted  . PAD (peripheral artery disease) ([place]) 08/14/2019  . Mixed hyperlipidemia 08/14/2019  . Exertional dyspnea 05/13/2019  . Coronary artery disease involving native coronary artery of native heart without angina pectoris 03/27/2019  . Aortic atherosclerosis ([place]) 03/27/2019  . Tobacco abuse 03/27/2019  . Essential hypertension 03/27/2019  . Asymptomatic carotid artery stenosis without infarction 03/27/2014    Past Surgical History:  Procedure Laterality Date  . APPENDECTOMY       OB History   No obstetric history on file.     Family History  Problem Relation Age of Onset  . Hypertension Mother   . Hyperlipidemia Mother   . Heart attack Mother   . Depression Mother        Bi-Polar  .  Cancer Father        Lung    Social History   Tobacco Use  . Smoking status: Current Every Day Smoker    Packs/day: 0.50    Years: 30.00    Pack years: 15.00    Types: Cigarettes  . Smokeless tobacco: Never Used  Vaping Use  . Vaping Use: Never used  Substance Use Topics  . Alcohol use: Yes    Alcohol/week: 21.0 standard drinks    Types: 21 Glasses of wine per week  . Drug use: No    Home Medications Prior to Admission medications   Medication Sig Start Date End Date Taking? Authorizing Provider  amLODipine (NORVASC) 10 MG tablet  07/23/14   [provider]  azelastine (ASTELIN) 0.1 % nasal spray  03/26/19   [provider]  escitalopram (LEXAPRO) 5 MG tablet Take 20 mg by mouth daily.  09/20/14   [provider]  hydrochlorothiazide (HYDRODIURIL) 25 MG tablet 25 mg.  09/23/16   [provider]  megestrol (MEGACE) 40 MG tablet Take 40 mg by mouth daily. Reported on 10/02/2015    [provider]  montelukast (SINGULAIR) 10 MG tablet Take 10 mg by mouth at bedtime.    [provider]  Multiple Vitamin (MULTIVITAMIN) capsule Take 1 capsule by mouth daily.    [provider]    Allergies    Penicillin g  Review of Systems   Review of Systems  Constitutional: Negative for chills, diaphoresis, fatigue  and fever.  HENT: Negative for congestion.   Eyes: Negative for photophobia and visual disturbance.  Respiratory: Negative for cough, chest tightness, shortness of breath and wheezing.   Cardiovascular: Negative for chest pain, palpitations, leg swelling and syncope.  Gastrointestinal: Negative for abdominal pain, nausea and vomiting.  Genitourinary: Negative for dysuria and flank pain.  Musculoskeletal: Negative for back pain, neck pain and neck stiffness.  Neurological: Positive for dizziness and headaches (intermittently but resolved now). Negative for seizures, syncope, facial asymmetry, speech difficulty, weakness,  light-headedness and numbness.  Psychiatric/Behavioral: Negative for agitation.  All other systems reviewed and are negative.   Physical Exam Updated Vital Signs BP (!) 201/87 (BP Location: Right Arm)   Pulse 95   Temp 98.1 F (36.7 C) (Oral)   Resp 16   SpO2 94%   Physical Exam Vitals and nursing note reviewed.  Constitutional:      General: She is not in acute distress.    Appearance: She is well-developed and well-nourished. She is not ill-appearing, toxic-appearing or diaphoretic.  HENT:     Head: Contusion present.     Comments: Bruising on both cheeks from facial trauma and falls    Right Ear: External ear normal.     Left Ear: External ear normal.     Nose: Nose normal. No congestion or rhinorrhea.     Mouth/Throat:     Mouth: Oropharynx is clear and moist. Mucous membranes are moist.     Pharynx: No oropharyngeal exudate or posterior oropharyngeal erythema.  Eyes:     Extraocular Movements: Extraocular movements intact and EOM normal.     Conjunctiva/sclera: Conjunctivae normal.     Pupils: Pupils are equal, round, and reactive to light.  Neck:     Vascular: No carotid bruit.  Cardiovascular:     Rate and Rhythm: Normal rate.     Pulses: Normal pulses.     Heart sounds: No murmur heard.   Pulmonary:     Effort: Pulmonary effort is normal. No respiratory distress.     Breath sounds: No stridor. No wheezing, rhonchi or rales.  Chest:     Chest wall: No tenderness.  Abdominal:     General: Abdomen is flat. There is no distension.     Tenderness: There is no abdominal tenderness. There is no rebound.  Musculoskeletal:        General: No tenderness.     Cervical back: Normal range of motion and neck supple. No tenderness.     Right lower leg: No edema.     Left lower leg: No edema.  Skin:    General: Skin is warm.     Coloration: Skin is not pale.     Findings: No erythema or rash.  Neurological:     Mental Status: She is alert and oriented to person,  place, and time.     GCS: GCS eye subscore is 4. GCS verbal subscore is 5. GCS motor subscore is 6.     Cranial Nerves: No dysarthria or facial asymmetry.     Sensory: No sensory deficit.     Motor: No weakness or abnormal muscle tone.     Coordination: Finger-Nose-Finger Test normal.     Deep Tendon Reflexes: Reflexes are normal and symmetric.     Comments: Gait deferred initially due to reported unsteadiness and room spinning dizziness.  2:05 PM on later reassessment, patient was able to ambulate with some slight unsteadiness but she did not fall.  Psychiatric:  Mood and Affect: Mood normal.     ED Results / Procedures / Treatments   Labs (all labs ordered are listed, but only abnormal results are displayed) Labs Reviewed  CBC - Abnormal; Notable for the following components:      Result Value   RBC 3.63 (*)    MCV 105.5 (*)    MCH 36.1 (*)    All other components within normal limits  URINALYSIS, ROUTINE W REFLEX MICROSCOPIC - Abnormal; Notable for the following components:   Specific Gravity, Urine >1.046 (*)    Hgb urine dipstick SMALL (*)    Ketones, ur 5 (*)    Protein, ur 30 (*)    Nitrite POSITIVE (*)    Bacteria, UA MANY (*)    All other components within normal limits  HEPATIC FUNCTION PANEL - Abnormal; Notable for the following components:   AST 43 (*)    All other components within normal limits  I-STAT CHEM 8, ED - Abnormal; Notable for the following components:   Potassium 3.3 (*)    Calcium, Ion 1.10 (*)    All other components within normal limits  TROPONIN I (HIGH SENSITIVITY) - Abnormal; Notable for the following components:   Troponin I (High Sensitivity) 25 (*)    All other components within normal limits  TSH  PROTIME-INR  CBG MONITORING, ED  TROPONIN I (HIGH SENSITIVITY)    EKG EKG Interpretation  Date/Time:  Thursday June 18 2020 11:05:47 EST Ventricular Rate:  95 PR Interval:    QRS Duration: 122 QT Interval:  414 QTC  Calculation: 521 R Axis:   30 Text Interpretation: Sinus rhythm Biatrial enlargement IVCD, consider atypical RBBB Left ventricular hypertrophy Anterior Q waves, possibly due to LVH Prolonged QT interval 12 Lead; Mason-Likar Will repeat ECG. Confirmed by Antony Blackbird [phone]) on 06/18/2020 12:06:21 PM   Radiology CT Angio Head W or Wo Contrast  Result Date: 06/18/2020 CLINICAL DATA:  Neuro deficit, acute, stroke suspected dizziness with falls for 2 weeks; Vertigo, central persistent dizziness for 2 weeks, BP over 200 EXAM: CT ANGIOGRAPHY HEAD AND NECK TECHNIQUE: Multidetector CT imaging of the head and neck was performed using the standard protocol during bolus administration of intravenous contrast. Multiplanar CT image reconstructions and MIPs were obtained to evaluate the vascular anatomy. Carotid stenosis measurements (when applicable) are obtained utilizing NASCET criteria, using the distal internal carotid diameter as the denominator. CONTRAST:  18m OMNIPAQUE IOHEXOL 350 MG/ML SOLN COMPARISON:  03/26/2020. FINDINGS: CT HEAD FINDINGS Brain: No acute infarct or intracranial hemorrhage. No mass lesion. No midline shift, ventriculomegaly or extra-axial fluid collection. Moderate cerebral atrophy with ex vacuo dilatation. Chronic microvascular ischemic changes. Vascular: No hyperdense vessel or unexpected calcification. Bilateral skull base atherosclerotic calcifications. Skull: Negative for fracture or focal lesion. Minimal left parietal scalp soft tissue swelling. Sinuses/Orbits: Please see concurrent maxillofacial CT for better evaluation. Other: None. Review of the MIP images confirms the above findings CTA NECK FINDINGS Aortic arch: Atherosclerotic calcifications involving the aortic arch. Standard branching. Moderate narrowing of the proximal left subclavian artery secondary to calcified atheromatous plaque. Mild to moderate narrowing of the innominate artery. Right carotid system: Patent. Bifurcation  calcified atheromatous plaque with approximately 40% narrowing of the carotid bulb. 20% narrowing of the proximal ICA. Left carotid system: Patent. Bifurcation calcified and noncalcified atheromatous plaque. 80-90% narrowing of the carotid bulb/proximal ICA. Vertebral arteries: Dominant right vertebral artery.  Patent. Skeleton: Multilevel spondylosis with reversal of cervical lordosis. No acute finding. Other neck: No acute finding.  Upper chest: Emphysema.  No acute finding. Review of the MIP images confirms the above findings CTA HEAD FINDINGS Anterior circulation: Bilateral carotid siphon atherosclerotic calcifications mild narrowing of the cavernous segments. Patent MCAs. Right A1 segment hypoplasia. Patent ACAs. Posterior circulation: Patent V4 segments and PICA. Patent basilar and superior cerebellar arteries. Fetal origin of the left PCA. Mild narrowing of the right P1 segment. Venous sinuses: As permitted by contrast timing, patent. Anatomic variants: Please see above. Review of the MIP images confirms the above findings IMPRESSION: Head CT: No acute intracranial process. Left parietal scalp soft tissue swelling. Moderate cerebral atrophy and chronic microvascular ischemic changes. CTA neck: 80-90% narrowing of the left carotid bulb/proximal ICA. 40% narrowing of the right carotid bulb and 20% narrowing of the proximal right ICA. CTA head: No large vessel occlusion or high-grade narrowing. Mild bilateral cavernous segment and right P1 segment narrowing. Electronically Signed   By: Primitivo Gauze M.D.   On: 06/18/2020 13:23   CT Angio Neck W and/or Wo Contrast  Result Date: 06/18/2020 CLINICAL DATA:  Neuro deficit, acute, stroke suspected dizziness with falls for 2 weeks; Vertigo, central persistent dizziness for 2 weeks, BP over 200 EXAM: CT ANGIOGRAPHY HEAD AND NECK TECHNIQUE: Multidetector CT imaging of the head and neck was performed using the standard protocol during bolus administration of  intravenous contrast. Multiplanar CT image reconstructions and MIPs were obtained to evaluate the vascular anatomy. Carotid stenosis measurements (when applicable) are obtained utilizing NASCET criteria, using the distal internal carotid diameter as the denominator. CONTRAST:  132m OMNIPAQUE IOHEXOL 350 MG/ML SOLN COMPARISON:  03/26/2020. FINDINGS: CT HEAD FINDINGS Brain: No acute infarct or intracranial hemorrhage. No mass lesion. No midline shift, ventriculomegaly or extra-axial fluid collection. Moderate cerebral atrophy with ex vacuo dilatation. Chronic microvascular ischemic changes. Vascular: No hyperdense vessel or unexpected calcification. Bilateral skull base atherosclerotic calcifications. Skull: Negative for fracture or focal lesion. Minimal left parietal scalp soft tissue swelling. Sinuses/Orbits: Please see concurrent maxillofacial CT for better evaluation. Other: None. Review of the MIP images confirms the above findings CTA NECK FINDINGS Aortic arch: Atherosclerotic calcifications involving the aortic arch. Standard branching. Moderate narrowing of the proximal left subclavian artery secondary to calcified atheromatous plaque. Mild to moderate narrowing of the innominate artery. Right carotid system: Patent. Bifurcation calcified atheromatous plaque with approximately 40% narrowing of the carotid bulb. 20% narrowing of the proximal ICA. Left carotid system: Patent. Bifurcation calcified and noncalcified atheromatous plaque. 80-90% narrowing of the carotid bulb/proximal ICA. Vertebral arteries: Dominant right vertebral artery.  Patent. Skeleton: Multilevel spondylosis with reversal of cervical lordosis. No acute finding. Other neck: No acute finding. Upper chest: Emphysema.  No acute finding. Review of the MIP images confirms the above findings CTA HEAD FINDINGS Anterior circulation: Bilateral carotid siphon atherosclerotic calcifications mild narrowing of the cavernous segments. Patent MCAs. Right A1  segment hypoplasia. Patent ACAs. Posterior circulation: Patent V4 segments and PICA. Patent basilar and superior cerebellar arteries. Fetal origin of the left PCA. Mild narrowing of the right P1 segment. Venous sinuses: As permitted by contrast timing, patent. Anatomic variants: Please see above. Review of the MIP images confirms the above findings IMPRESSION: Head CT: No acute intracranial process. Left parietal scalp soft tissue swelling. Moderate cerebral atrophy and chronic microvascular ischemic changes. CTA neck: 80-90% narrowing of the left carotid bulb/proximal ICA. 40% narrowing of the right carotid bulb and 20% narrowing of the proximal right ICA. CTA head: No large vessel occlusion or high-grade narrowing. Mild bilateral cavernous segment and right  P1 segment narrowing. Electronically Signed   By: Primitivo Gauze M.D.   On: 06/18/2020 13:23   MR BRAIN WO CONTRAST  Result Date: 06/18/2020 CLINICAL DATA:  Dizziness, recent fall EXAM: MRI HEAD WITHOUT CONTRAST TECHNIQUE: Multiplanar, multiecho pulse sequences of the brain and surrounding structures were obtained without intravenous contrast. COMPARISON:  None. FINDINGS: Brain: Punctate focus of mild diffusion hyperintensity with ADC iso to hyperintensity involving the right corona radiata. No evidence of hemorrhage. There is no intracranial mass or mass effect. There is no hydrocephalus or extra-axial fluid collection. Patchy and confluent areas of T2 hyperintensity in the supratentorial white matter are nonspecific but probably reflect moderate chronic microvascular ischemic changes. Prominence of the ventricles and sulci reflects generalized parenchymal volume loss. Vascular: Major vessel flow voids at the skull base are preserved. Skull and upper cervical spine: Normal marrow signal is preserved. Sinuses/Orbits: Paranasal sinuses are aerated. Orbits are unremarkable. Other: Sella is unremarkable.  Mastoid air cells are clear. IMPRESSION: Small  subacute to chronic infarct of the right corona radiata. Moderate chronic microvascular ischemic changes. Electronically Signed   By: Macy Mis M.D.   On: 06/18/2020 14:45   CT Maxillofacial Wo Contrast  Result Date: 06/18/2020 CLINICAL DATA:  Facial trauma. EXAM: CT MAXILLOFACIAL WITHOUT CONTRAST TECHNIQUE: Multidetector CT imaging of the maxillofacial structures was performed. Multiplanar CT image reconstructions were also generated. COMPARISON:  None. FINDINGS: Osseous: Mildly displaced nasal bone fracture is noted. Mandible is unremarkable. Orbits: Negative. No traumatic or inflammatory finding. Sinuses: Clear. Soft tissues: Negative. Limited intracranial: No significant or unexpected finding. IMPRESSION: Mildly displaced nasal bone fracture. Electronically Signed   By: Marijo Conception M.D.   On: 06/18/2020 13:02    Procedures Procedures (including critical care time)  Medications Ordered in ED Medications  nicotine (NICODERM CQ - dosed in mg/24 hours) patch 14 mg (has no administration in time range)  iohexol (OMNIPAQUE) 350 MG/ML injection 100 mL (100 mLs Intravenous Contrast Given 06/18/20 1237)    ED Course  I have reviewed the triage vital signs and the nursing notes.  Pertinent labs & imaging results that were available during my care of the patient were reviewed by me and considered in my medical decision making (see chart for details).    MDM Rules/Calculators/A&P                          YUKTHA KERCHNER is a 75 y.o. female with a past medical history significant for reportedly well-controlled hypertension, peripheral arterial disease, hyperlipidemia, carotid stenosis, and CAD who presents with multiple falls and persistent unsteadiness for the last 2 weeks.  She reports a 2-week ago, she had onset of dizziness and feeling like the room is spinning.  She reports that this has been waxing and waning severity but has been constant for the last 2 weeks.  She denies any  difficulty with speech or vision changes but reports has had multiple falls with this unsteadiness.  She reports she chronically has headaches on and off but has not had any new or persistent headache.  She denies any neck pain.  She reports she has had some mild pain in her face after she sustained the falls and has some bruising to her cheeks.  She denies difficulty swallowing or breathing.  She denies any fevers, chills, chest pain, shortness breath, palpitations, nausea, vomiting, constipation, diarrhea, or urinary symptoms.  She reports has been taking all her blood pressure medicine at home but  her blood pressures been elevated.  When she told her on-call PCP today about her blood pressures they told her to come to the emergency department.  On exam, lungs are clear and chest is nontender.  Abdomen is nontender.  I could not appreciate a carotid bruit on the initial exam.  Normal finger-nose-finger testing bilaterally and normal sensation and strength in extremities.  No facial droop seen.  Symmetric smile.  Clear speech.  Pupils are symmetric and reactive with normal extraocular movements.  She has bruising on both cheeks from the falls.  No significant tenderness on exam.  She reports she is still feeling constant dizziness despite positioning.  Her EKG does appear abnormal and patient may have an underlying atrial flutter versus a tremor.  No STEMI seen.   Clinically I am concerned about stroke given her known carotid disease, elevated blood pressures, and this persistent dizziness for 2 weeks with multiple falls.  We will get a CTA head and neck to look for vascular abnormality and to rule out bleeding given the elevated blood pressures however, will hold on significantly treating her blood pressure until we know we do not need permissive hypertension with acute stroke.  We will also get other labs and work-up to look for other sources of the low blood pressures.  We will get CT of the face given the  facial trauma  If CT is reassuring, dissipate touching base with neurology and potentially getting MRI and discussing with him blood pressure management.   CT does not show acute bleed or stroke but does show some chronic changes and vascular stenosis in the neck.   2:01 PM Just spoke to Dr. Sarita Haver with neurology who reviewed the case.  We discussed how the CTA did show some carotid stenosis between 80 to 90% in the left carotid bulb/proximal ICA than the right which had 40%.  They also do not see acute stroke on the CT or bleeding.  Neurology feels that if the MRI is negative, she is likely stable for discharge and outpatient follow-up for her blood pressure management.    Patient ordered a nicotine patch so that she did not like a cigarette in the emergency department.  Anticipate reassessment after MRI to determine if she needs to be met or not.  Neurology agreed with keeping her blood pressure below 220 at this time.   3:00 PM MRI just returned showing a possible right subacute to chronic stroke.  Urinalysis also returned showing nitrites and bacteria, given the patient's unsteadiness I am concerned she may have UTI.  Will order some antibiotics.  I spoke again with neurology and given this urine finding, he agrees with not giving her the medicines and steroids for vestibular neuritis as discussed earlier.  He does feel that if she needs admission, she should have an inpatient stroke work-up with echo and further stroke work-up.  He did recommend starting aspirin 81 mg daily which I will order.  She will likely also need PT/OT given the multiple falls she has had this week and her blood pressure still being elevated despite home blood pressure medicine.  3:19 PM I went and told the patient all the findings including the urinary tract infection, waxing waning blood pressure findings, and her stenosis and subacute stroke.  Patient would like to go home.  She would rather take antibiotics.   Patient is intolerant to penicillins so we will give Macrobid.  I told her neurology recommended 81 mg aspirin daily but she says  she wants to talk to her PCP before starting this.  She was still recommended to take it.  Patient will follow-up with outpatient neurology and will call her PCP about blood pressure management as well.  She would like to go home.  I watched her ambulate and she did not fall.  She agreed with plan of care and was discharged in good condition with understanding of return precautions and follow-up instructions.   Final Clinical Impression(s) / ED Diagnoses Final diagnoses:  Dizziness  Closed fracture of nasal bone, initial encounter  Cerebrovascular accident (CVA), unspecified mechanism ([place])  Elevated blood pressure reading  Acute cystitis without hematuria  Bilateral carotid artery stenosis    Rx / DC Orders ED Discharge Orders         Ordered    nitrofurantoin, macrocrystal-monohydrate, (MACROBID) 100 MG capsule  2 times daily        06/18/20 1525          Clinical Impression: 1. Closed fracture of nasal bone, initial encounter   2. Dizziness   3. Ataxia   4. Cerebrovascular accident (CVA), unspecified mechanism ([place])   5. Elevated blood pressure reading   6. Acute cystitis without hematuria   7. Bilateral carotid artery stenosis     Disposition: Discharge  Condition: Good  I have discussed the results, Dx and Tx plan with the pt(& family if present). He/she/they expressed understanding and agree(s) with the plan. Discharge instructions discussed at great length. Strict return precautions discussed and pt &/or family have verbalized understanding of the instructions. No further questions at time of discharge.    New Prescriptions   NITROFURANTOIN, MACROCRYSTAL-MONOHYDRATE, (MACROBID) 100 MG CAPSULE    Take 1 capsule (100 mg total) by mouth 2 (two) times daily.    Follow Up: Fanny Bien, [address] [place]  [id number] [phone]     [facility] [address] [place] DEPT [place] [id number] mc [place]        Avinash Maltos, Gwenyth Allegra, MD 06/18/20 1528    Konnor Jorden, Gwenyth Allegra, MD 06/18/20 [id number]

## 2020-06-18 NOTE — ED Triage Notes (Signed)
Per EMS-woke up feeling dizzy-history of HTN-has not taken BP meds-denies pain-CBG 90

## 2020-06-18 NOTE — ED Notes (Signed)
An After Visit Summary was printed and given to the patient. Discharge instructions given and no further questions at this time.  Pt refused nicotine patch. Pt refused to allow a last set of VS.

## 2020-06-18 NOTE — Discharge Instructions (Signed)
Your work-up today did reveal several concerning findings including a urinary tract infection, nasal fracture from your fall, a recent subacute stroke, carotid stenosis, and labile blood pressures.  Please continue treating your blood pressure at home and call your primary doctor tomorrow to discuss blood pressure management titration.  Please also talk to them about further management of your carotid stenosis.  For the stroke, neurology recommended aspirin 81 mg daily however please confirm with your primary team about taking that based on your concerns.  Please rest and stay hydrated.  Please follow-up with your primary doctor for the nasal fracture.  If any symptoms change or worsen, please return to the nearest emergency department.

## 2020-06-23 DIAGNOSIS — N952 Postmenopausal atrophic vaginitis: Secondary | ICD-10-CM | POA: Diagnosis not present

## 2020-06-23 DIAGNOSIS — N3000 Acute cystitis without hematuria: Secondary | ICD-10-CM | POA: Diagnosis not present

## 2020-06-23 DIAGNOSIS — R64 Cachexia: Secondary | ICD-10-CM | POA: Diagnosis not present

## 2020-06-23 DIAGNOSIS — K52831 Collagenous colitis: Secondary | ICD-10-CM | POA: Diagnosis not present

## 2020-06-23 DIAGNOSIS — E860 Dehydration: Secondary | ICD-10-CM | POA: Diagnosis not present

## 2020-06-23 DIAGNOSIS — I10 Essential (primary) hypertension: Secondary | ICD-10-CM | POA: Diagnosis not present

## 2020-06-23 DIAGNOSIS — E46 Unspecified protein-calorie malnutrition: Secondary | ICD-10-CM | POA: Diagnosis not present

## 2020-06-25 ENCOUNTER — Encounter (HOSPITAL_COMMUNITY): Payer: Self-pay

## 2020-06-25 ENCOUNTER — Inpatient Hospital Stay (HOSPITAL_COMMUNITY)
Admission: EM | Admit: 2020-06-25 | Discharge: 2020-06-30 | DRG: 683 | Disposition: A | Discharge status: Home Health | Payer: Medicare Other | Attending: Internal Medicine | Admitting: Internal Medicine

## 2020-06-25 ENCOUNTER — Other Ambulatory Visit: Payer: Self-pay

## 2020-06-25 ENCOUNTER — Emergency Department (HOSPITAL_COMMUNITY): Payer: Medicare Other

## 2020-06-25 DIAGNOSIS — F1010 Alcohol abuse, uncomplicated: Secondary | ICD-10-CM | POA: Diagnosis present

## 2020-06-25 DIAGNOSIS — F32A Depression, unspecified: Secondary | ICD-10-CM | POA: Diagnosis present

## 2020-06-25 DIAGNOSIS — R627 Adult failure to thrive: Secondary | ICD-10-CM | POA: Diagnosis present

## 2020-06-25 DIAGNOSIS — I10 Essential (primary) hypertension: Secondary | ICD-10-CM | POA: Diagnosis present

## 2020-06-25 DIAGNOSIS — S0993XA Unspecified injury of face, initial encounter: Secondary | ICD-10-CM | POA: Diagnosis not present

## 2020-06-25 DIAGNOSIS — D649 Anemia, unspecified: Secondary | ICD-10-CM | POA: Diagnosis present

## 2020-06-25 DIAGNOSIS — I6782 Cerebral ischemia: Secondary | ICD-10-CM | POA: Diagnosis not present

## 2020-06-25 DIAGNOSIS — Z20822 Contact with and (suspected) exposure to covid-19: Secondary | ICD-10-CM | POA: Diagnosis present

## 2020-06-25 DIAGNOSIS — G319 Degenerative disease of nervous system, unspecified: Secondary | ICD-10-CM | POA: Diagnosis not present

## 2020-06-25 DIAGNOSIS — F17210 Nicotine dependence, cigarettes, uncomplicated: Secondary | ICD-10-CM | POA: Diagnosis present

## 2020-06-25 DIAGNOSIS — Z880 Allergy status to penicillin: Secondary | ICD-10-CM

## 2020-06-25 DIAGNOSIS — N179 Acute kidney failure, unspecified: Secondary | ICD-10-CM | POA: Diagnosis not present

## 2020-06-25 DIAGNOSIS — E876 Hypokalemia: Secondary | ICD-10-CM | POA: Diagnosis not present

## 2020-06-25 DIAGNOSIS — R5383 Other fatigue: Secondary | ICD-10-CM | POA: Diagnosis not present

## 2020-06-25 DIAGNOSIS — N390 Urinary tract infection, site not specified: Secondary | ICD-10-CM | POA: Diagnosis not present

## 2020-06-25 DIAGNOSIS — J439 Emphysema, unspecified: Secondary | ICD-10-CM | POA: Diagnosis not present

## 2020-06-25 DIAGNOSIS — S0003XA Contusion of scalp, initial encounter: Secondary | ICD-10-CM | POA: Diagnosis not present

## 2020-06-25 DIAGNOSIS — I2510 Atherosclerotic heart disease of native coronary artery without angina pectoris: Secondary | ICD-10-CM | POA: Diagnosis present

## 2020-06-25 DIAGNOSIS — I160 Hypertensive urgency: Secondary | ICD-10-CM | POA: Diagnosis present

## 2020-06-25 DIAGNOSIS — M47812 Spondylosis without myelopathy or radiculopathy, cervical region: Secondary | ICD-10-CM | POA: Diagnosis not present

## 2020-06-25 DIAGNOSIS — W19XXXA Unspecified fall, initial encounter: Secondary | ICD-10-CM | POA: Diagnosis present

## 2020-06-25 DIAGNOSIS — D7589 Other specified diseases of blood and blood-forming organs: Secondary | ICD-10-CM | POA: Diagnosis present

## 2020-06-25 DIAGNOSIS — E860 Dehydration: Secondary | ICD-10-CM | POA: Diagnosis present

## 2020-06-25 DIAGNOSIS — M4802 Spinal stenosis, cervical region: Secondary | ICD-10-CM | POA: Diagnosis not present

## 2020-06-25 DIAGNOSIS — E46 Unspecified protein-calorie malnutrition: Secondary | ICD-10-CM | POA: Diagnosis present

## 2020-06-25 DIAGNOSIS — R824 Acetonuria: Secondary | ICD-10-CM | POA: Diagnosis present

## 2020-06-25 DIAGNOSIS — J449 Chronic obstructive pulmonary disease, unspecified: Secondary | ICD-10-CM | POA: Diagnosis present

## 2020-06-25 DIAGNOSIS — Z8249 Family history of ischemic heart disease and other diseases of the circulatory system: Secondary | ICD-10-CM

## 2020-06-25 DIAGNOSIS — Z83438 Family history of other disorder of lipoprotein metabolism and other lipidemia: Secondary | ICD-10-CM

## 2020-06-25 DIAGNOSIS — R296 Repeated falls: Secondary | ICD-10-CM | POA: Diagnosis present

## 2020-06-25 DIAGNOSIS — Z79899 Other long term (current) drug therapy: Secondary | ICD-10-CM

## 2020-06-25 DIAGNOSIS — Z818 Family history of other mental and behavioral disorders: Secondary | ICD-10-CM

## 2020-06-25 DIAGNOSIS — S199XXA Unspecified injury of neck, initial encounter: Secondary | ICD-10-CM | POA: Diagnosis not present

## 2020-06-25 DIAGNOSIS — Z801 Family history of malignant neoplasm of trachea, bronchus and lung: Secondary | ICD-10-CM

## 2020-06-25 DIAGNOSIS — F419 Anxiety disorder, unspecified: Secondary | ICD-10-CM | POA: Diagnosis present

## 2020-06-25 LAB — BASIC METABOLIC PANEL
Anion gap: 18 — ABNORMAL HIGH (ref 5–15)
BUN: 32 mg/dL — ABNORMAL HIGH (ref 8–23)
CO2: 24 mmol/L (ref 22–32)
Calcium: 9.4 mg/dL (ref 8.9–10.3)
Chloride: 93 mmol/L — ABNORMAL LOW (ref 98–111)
Creatinine, Ser: 1.33 mg/dL — ABNORMAL HIGH (ref 0.44–1.00)
GFR, Estimated: 42 mL/min — ABNORMAL LOW (ref >60)
Glucose, Bld: 111 mg/dL — ABNORMAL HIGH (ref 70–99)
Potassium: 3 mmol/L — ABNORMAL LOW (ref 3.5–5.1)
Sodium: 135 mmol/L (ref 135–145)

## 2020-06-25 LAB — CBC
HCT: 36.7 % (ref 36.0–46.0)
Hemoglobin: 12.9 g/dL (ref 12.0–15.0)
MCH: 36.1 pg — ABNORMAL HIGH (ref 26.0–34.0)
MCHC: 35.1 g/dL (ref 30.0–36.0)
MCV: 102.8 fL — ABNORMAL HIGH (ref 80.0–100.0)
Platelets: 243 10*3/uL (ref 150–400)
RBC: 3.57 MIL/uL — ABNORMAL LOW (ref 3.87–5.11)
RDW: 12.5 % (ref 11.5–15.5)
WBC: 6.6 10*3/uL (ref 4.0–10.5)
nRBC: 0 % (ref 0.0–0.2)

## 2020-06-25 LAB — URINALYSIS, ROUTINE W REFLEX MICROSCOPIC
Bacteria, UA: NONE SEEN
Bilirubin Urine: NEGATIVE
Glucose, UA: NEGATIVE mg/dL
Hgb urine dipstick: NEGATIVE
Ketones, ur: 5 mg/dL — AB
Nitrite: NEGATIVE
Protein, ur: 30 mg/dL — AB
Specific Gravity, Urine: 1.021 (ref 1.005–1.030)
pH: 5 (ref 5.0–8.0)

## 2020-06-25 LAB — POC SARS CORONAVIRUS 2 AG -  ED: SARS Coronavirus 2 Ag: NEGATIVE

## 2020-06-25 LAB — MAGNESIUM: Magnesium: 1.5 mg/dL — ABNORMAL LOW (ref 1.7–2.4)

## 2020-06-25 LAB — ETHANOL: Alcohol, Ethyl (B): <10 mg/dL (ref <10)

## 2020-06-25 IMAGING — CT CT HEAD W/O CM
4 of 7 series · 15 of 47 positions shown, 16 images · non-contrast
Comparison: Head CT and brain MRI [DATE], [DATE] week prior.

CLINICAL DATA: Polytrauma, critical, head/C-spine injury suspected;
Facial trauma Polytrauma, critical, head/C-spine injury suspected

Frequent falls.
EXAM:
CT HEAD WITHOUT CONTRAST
CT CERVICAL SPINE WITHOUT CONTRAST
TECHNIQUE: Multidetector CT imaging of the head and cervical spine was
performed following the standard protocol without intravenous
contrast. Multiplanar CT image reconstructions of the cervical spine
were also generated.

[Series 2: head wo · axial · 0.47mm/px · z∈[-110,-60]mm · 2 of 30 slices shown, 3 images]
[im 10/30  brain]
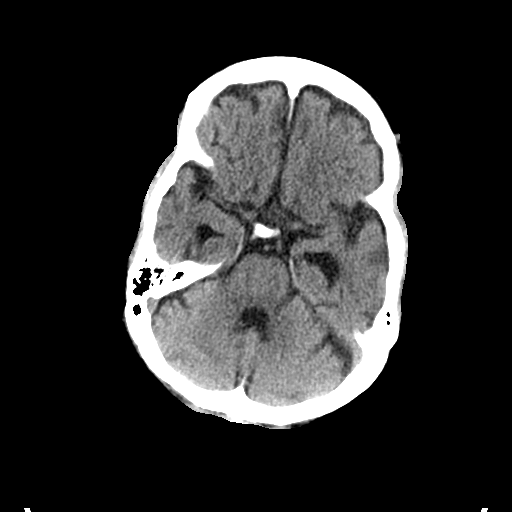
[im 10/30  bone]
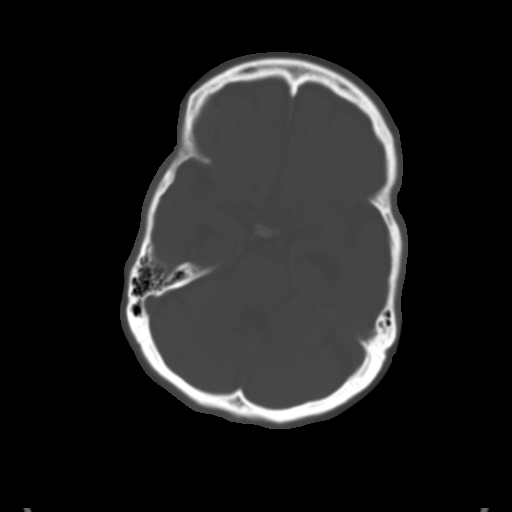
[im 20/30  brain]
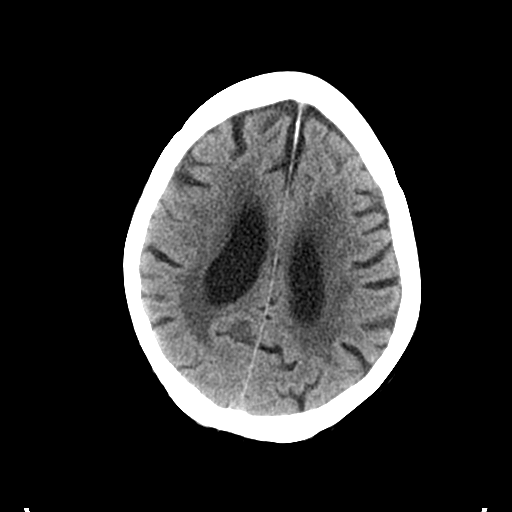

[Series 4: coronal soft tissue · coronal · 0.29mm/px · 3 of 75 slices shown]
[im 25/75  brain]
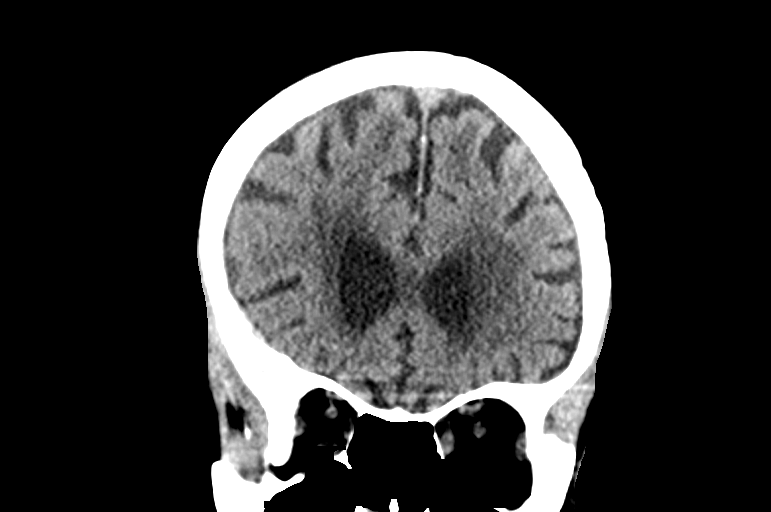
[im 38/75  brain]
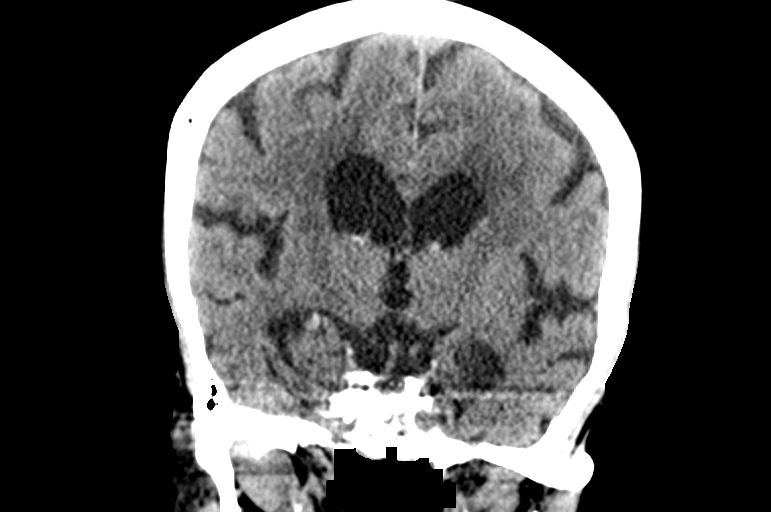
[im 50/75  brain]
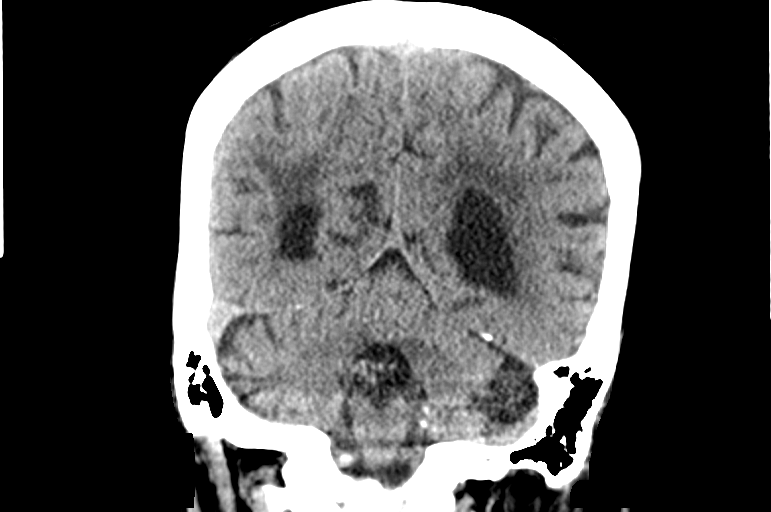

[Series 5: sagittal soft tissue · sagittal · 0.29mm/px · 2 of 63 slices shown]
[im 21/63  brain]
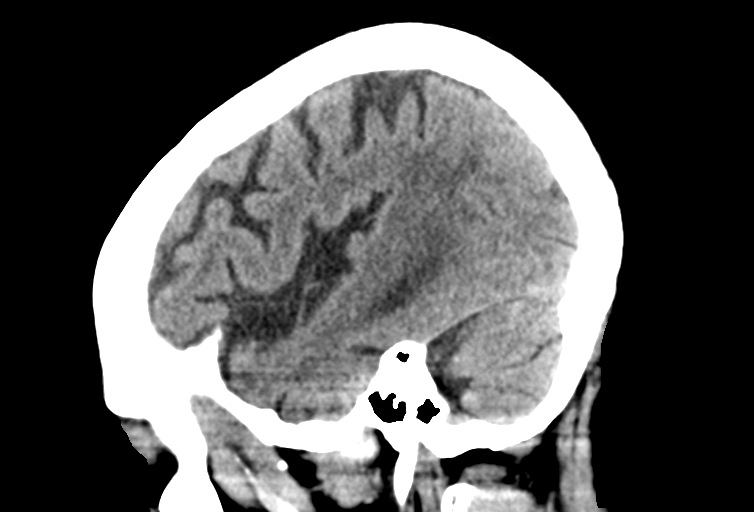
[im 42/63  brain]
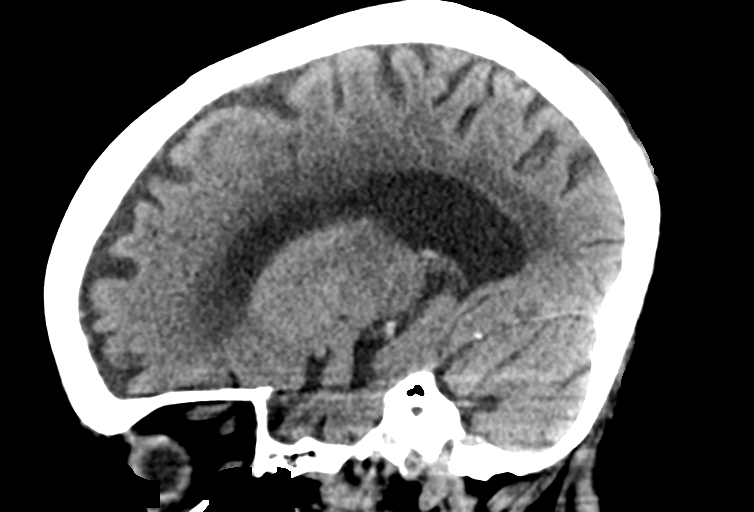

[Series 9: orthogonal bone · axial · 0.26mm/px · z∈[-294,-170]mm · 8 of 88 slices shown]
[im 7/88  bone]
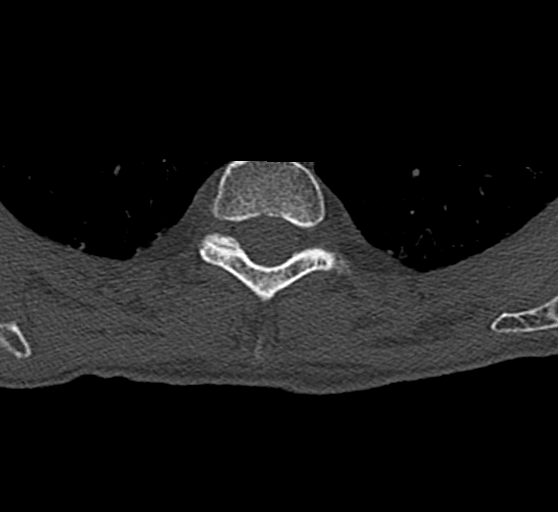
[im 21/88  bone]
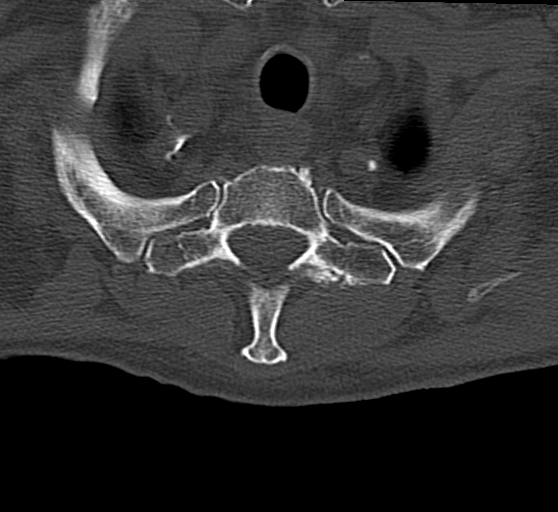
[im 27/88  bone]
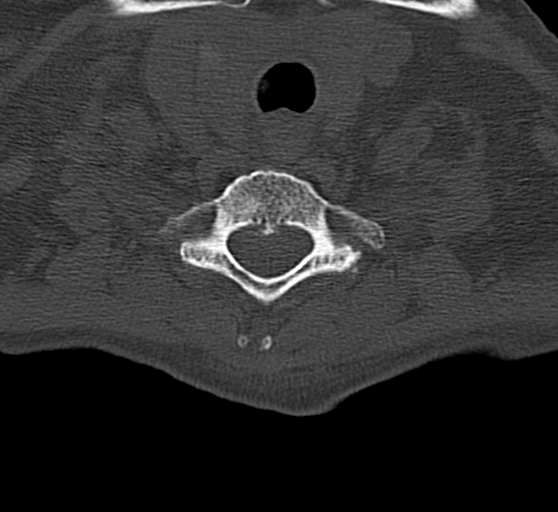
[im 41/88  bone]
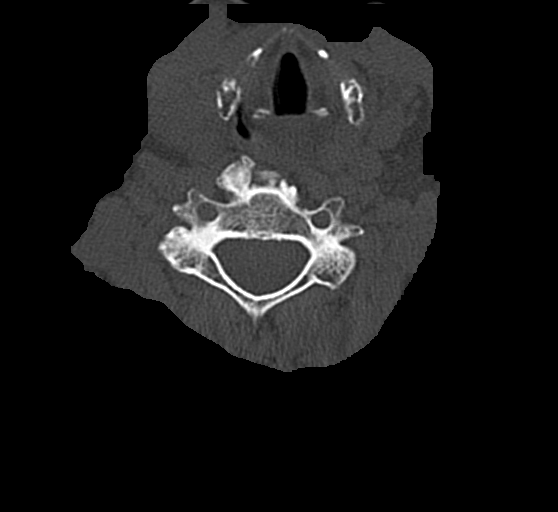
[im 47/88  bone]
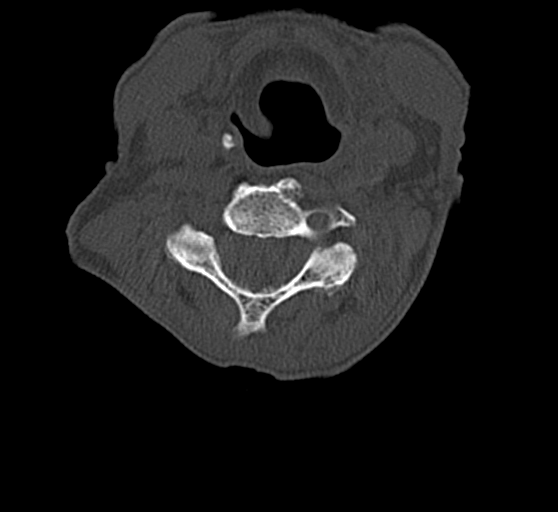
[im 61/88  bone]
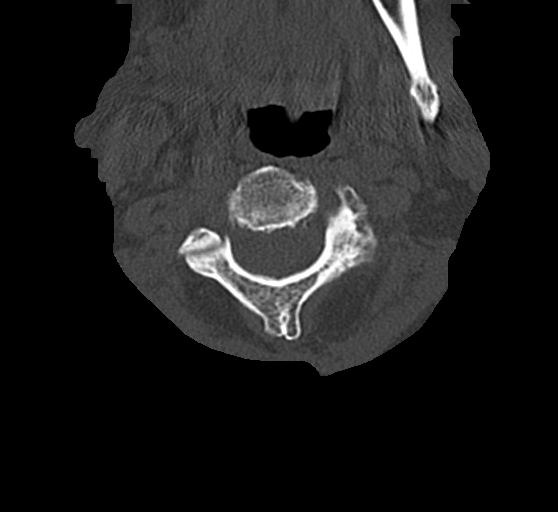
[im 67/88  bone]
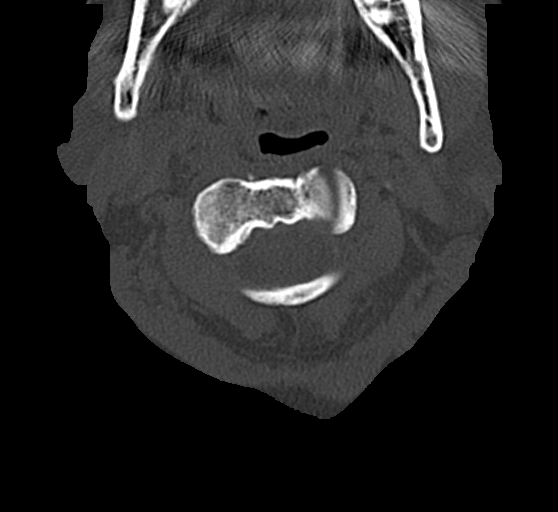
[im 81/88  bone]
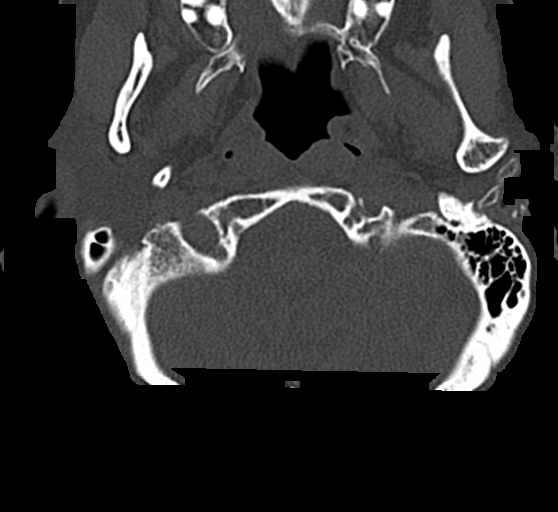

[15 of 47 positions shown; findings below may reference images not displayed]

FINDINGS: CT HEAD FINDINGS

Brain: Stable degree of atrophy and chronic small vessel ischemia,
advanced for age. No intracranial hemorrhage, mass effect, or
midline shift. No hydrocephalus. The basilar cisterns are patent.
The subacute right corona radiata infarct on MRI has no definite CT
correlate. No evidence of territorial infarct or acute ischemia. No
extra-axial or intracranial fluid collection.

Vascular: Atherosclerosis of skullbase vasculature without
hyperdense vessel or abnormal calcification.

Skull: No fracture or focal lesion.

Sinuses/Orbits: Paranasal sinuses and mastoid air cells are clear.
The visualized orbits are unremarkable.

Other: Left parietooccipital scalp hematoma.

CT CERVICAL SPINE FINDINGS

Alignment: Straightening of normal lordosis. Broad-based rightward
scoliotic curvature versus positioning. Questionable widening of
right C5-C6 facet, unchanged from recent exam.

Skull base and vertebrae: No acute fracture. Vertebral body heights
are maintained. The dens and skull base are intact. Mild
degenerative pannus at C1-C2.

Soft tissues and spinal canal: No prevertebral fluid or swelling. No
visible canal hematoma.

Disc levels: Disc space narrowing and endplate spurring at every
level, most prominent at C5-C6 and C6-C7. There is multilevel facet
hypertrophy. No high-grade canal stenosis.

Upper chest: Minimal emphysema.

Other: None.
IMPRESSION: 1. Left parietooccipital scalp hematoma. No acute intracranial
abnormality. No skull fracture.
2. No CT correlate for the right corona radiata subacute to chronic
infarct on recent MRI.
3. Stable atrophy and chronic small vessel ischemia, advanced for
age.
4. Multilevel degenerative change throughout the cervical spine
without acute fracture or subluxation.

## 2020-06-25 MED ORDER — LORAZEPAM 1 MG PO TABS: 0.0000 mg | ORAL_TABLET | Freq: Two times a day (BID) | ORAL | Status: DC

## 2020-06-25 MED ORDER — SODIUM CHLORIDE 0.9 % IV BOLUS
1000.0000 mL | Freq: Once | INTRAVENOUS | Status: AC
  Administered 2020-06-25: 1000 mL via INTRAVENOUS

## 2020-06-25 MED ORDER — LORAZEPAM 1 MG PO TABS: 0.0000 mg | ORAL_TABLET | Freq: Four times a day (QID) | ORAL | Status: DC

## 2020-06-25 MED ORDER — LORAZEPAM 2 MG/ML IJ SOLN: 0.0000 mg | Freq: Four times a day (QID) | INTRAMUSCULAR | Status: DC

## 2020-06-25 MED ORDER — THIAMINE HCL 100 MG PO TABS: 100.0000 mg | ORAL_TABLET | Freq: Every day | ORAL | Status: DC

## 2020-06-25 MED ORDER — THIAMINE HCL 100 MG/ML IJ SOLN: 100.0000 mg | Freq: Every day | INTRAMUSCULAR | Status: DC

## 2020-06-25 MED ORDER — FOLIC ACID 1 MG PO TABS
1.0000 mg | ORAL_TABLET | Freq: Once | ORAL | Status: AC
  Administered 2020-06-25: 1 mg via ORAL
  Filled 2020-06-25: qty 1

## 2020-06-25 MED ORDER — MAGNESIUM SULFATE 2 GM/50ML IV SOLN
2.0000 g | INTRAVENOUS | Status: AC
  Administered 2020-06-25: 2 g via INTRAVENOUS
  Filled 2020-06-25: qty 50

## 2020-06-25 MED ORDER — POTASSIUM CHLORIDE IN NACL 20-0.9 MEQ/L-% IV SOLN
Freq: Once | INTRAVENOUS | Status: AC
  Filled 2020-06-25: qty 1000

## 2020-06-25 MED ORDER — THIAMINE HCL 100 MG PO TABS
100.0000 mg | ORAL_TABLET | Freq: Once | ORAL | Status: AC
  Administered 2020-06-25: 100 mg via ORAL
  Filled 2020-06-25: qty 1

## 2020-06-25 MED ORDER — LORAZEPAM 2 MG/ML IJ SOLN: 0.0000 mg | Freq: Two times a day (BID) | INTRAMUSCULAR | Status: DC

## 2020-06-25 NOTE — ED Triage Notes (Signed)
Patient was taken to her PCP today by her friend. PCP sent paperwork stating that the patient needed to be admitted for FTT, UTI, Not eating, alcohol abuse, and frequent fall. Patient has bruising to the left eye. Patient denies drinking alcohol, but paperwork states she drinks all day.

## 2020-06-25 NOTE — ED Provider Notes (Signed)
[facility] DEPT Provider Note   CSN: [id number] Arrival date & time: 06/25/20  1643     History Chief Complaint  Patient presents with  . Fatigue  . frequent falls  . UTI  . Failure To Thrive    Crystal Noble is a 75 y.o. female.  HPI Elderly female with multiple medical issues including alcohol abuse presents with a friend who assists with the history. Patient was seen and evaluated here after fall earlier in the week, notes that upon going home she was transiently better, but now has had progressive weakness.  She continues to drink alcohol. She had additional fall yesterday, and since that time has had mild discomfort in her head, but no additional falls, no additional syncope. However, with new/worsening ability to perform ADLs secondary to weakness, worsening anorexia, nausea, she which her physician. There she was seen, evaluated and sent here for evaluation with consideration malnutrition, dehydration, need for resuscitation.     Past Medical History:  Diagnosis Date  . Carotid stenosis, asymptomatic   . Colitis, collagenous   . Hypertension     Patient Active Problem List   Diagnosis Date Noted  . AKI (acute kidney injury) ([place]) 06/25/2020  . PAD (peripheral artery disease) ([place]) 08/14/2019  . Mixed hyperlipidemia 08/14/2019  . Exertional dyspnea 05/13/2019  . Coronary artery disease involving native coronary artery of native heart without angina pectoris 03/27/2019  . Aortic atherosclerosis ([place]) 03/27/2019  . Tobacco abuse 03/27/2019  . Essential hypertension 03/27/2019  . Asymptomatic carotid artery stenosis without infarction 03/27/2014    Past Surgical History:  Procedure Laterality Date  . APPENDECTOMY       OB History   No obstetric history on file.     Family History  Problem Relation Age of Onset  . Hypertension Mother   . Hyperlipidemia Mother   . Heart attack Mother   . Depression Mother         Bi-Polar  . Cancer Father        Lung    Social History   Tobacco Use  . Smoking status: Current Every Day Smoker    Packs/day: 0.50    Years: 30.00    Pack years: 15.00    Types: Cigarettes  . Smokeless tobacco: Never Used  Vaping Use  . Vaping Use: Never used  Substance Use Topics  . Alcohol use: Yes    Alcohol/week: 21.0 standard drinks    Types: 21 Glasses of wine per week  . Drug use: No    Home Medications Prior to Admission medications   Medication Sig Start Date End Date Taking? Authorizing Provider  fluticasone-salmeterol (ADVAIR HFA) 230-21 MCG/ACT inhaler Inhale 2 puffs into the lungs 2 (two) times daily.   Yes [provider]  sulfamethoxazole-trimethoprim (BACTRIM DS) 800-160 MG tablet Take 1 tablet by mouth 2 (two) times daily.   Yes [provider]  amLODipine (NORVASC) 10 MG tablet  07/23/14   [provider]  azelastine (ASTELIN) 0.1 % nasal spray  03/26/19   [provider]  escitalopram (LEXAPRO) 5 MG tablet Take 20 mg by mouth daily.  09/20/14   [provider]  hydrochlorothiazide (HYDRODIURIL) 25 MG tablet 25 mg.  09/23/16   [provider]  megestrol (MEGACE) 40 MG tablet Take 40 mg by mouth daily. Reported on 10/02/2015    [provider]  montelukast (SINGULAIR) 10 MG tablet Take 10 mg by mouth at bedtime.    [provider]  Multiple Vitamin (MULTIVITAMIN) capsule Take 1 capsule by mouth daily.    [provider]  nitrofurantoin, macrocrystal-monohydrate, (MACROBID) 100 MG capsule Take 1 capsule (100 mg total) by mouth 2 (two) times daily. 06/18/20   Tegeler, Gwenyth Allegra, MD    Allergies    Penicillin g  Review of Systems   Review of Systems  Constitutional:       Per HPI, otherwise negative  HENT:       Per HPI, otherwise negative  Respiratory:       Per HPI, otherwise negative  Cardiovascular:       Per HPI, otherwise negative  Gastrointestinal: Negative for  vomiting.  Endocrine:       Negative aside from HPI  Genitourinary:       Neg aside from HPI   Musculoskeletal:       Per HPI, otherwise negative  Skin: Positive for color change and wound.  Neurological: Positive for weakness. Negative for syncope.  Hematological: Bruises/bleeds easily.  Psychiatric/Behavioral:       Alcohol addiction    Physical Exam Updated Vital Signs BP (!) 168/69   Pulse 75   Temp 98.3 F (36.8 C) (Oral)   Resp 18   Ht 5' (1.524 m)   Wt 38.6 kg   SpO2 95%   BMI 16.60 kg/m   Physical Exam Vitals and nursing note reviewed.  Constitutional:      Appearance: She is well-developed and well-nourished. She is ill-appearing.  HENT:     Head: Normocephalic.   Eyes:     Extraocular Movements: EOM normal.     Conjunctiva/sclera: Conjunctivae normal.  Cardiovascular:     Rate and Rhythm: Normal rate and regular rhythm.  Pulmonary:     Effort: Pulmonary effort is normal. No respiratory distress.     Breath sounds: Normal breath sounds. No stridor.  Abdominal:     General: There is no distension.  Musculoskeletal:        General: No edema.     Cervical back: Full passive range of motion without pain and normal range of motion. No spinous process tenderness or muscular tenderness.  Skin:    General: Skin is warm and dry.  Neurological:     Mental Status: She is alert and oriented to person, place, and time.     Cranial Nerves: No cranial nerve deficit.     Motor: Atrophy present.  Psychiatric:        Mood and Affect: Mood and affect normal.        Behavior: Behavior is slowed and withdrawn.      ED Results / Procedures / Treatments   Labs (all labs ordered are listed, but only abnormal results are displayed) Labs Reviewed  BASIC METABOLIC PANEL - Abnormal; Notable for the following components:      Result Value   Potassium 3.0 (*)    Chloride 93 (*)    Glucose, Bld 111 (*)    BUN 32 (*)    Creatinine, Ser 1.33 (*)    GFR, Estimated 42 (*)     Anion gap 18 (*)    All other components within normal limits  CBC - Abnormal; Notable for the following components:   RBC 3.57 (*)    MCV 102.8 (*)    MCH 36.1 (*)    All other components within normal limits  URINALYSIS, ROUTINE W REFLEX MICROSCOPIC - Abnormal; Notable for the following components:   APPearance HAZY (*)    Ketones, ur 5 (*)  Protein, ur 30 (*)    Leukocytes,Ua TRACE (*)    All other components within normal limits  MAGNESIUM - Abnormal; Notable for the following components:   Magnesium 1.5 (*)    All other components within normal limits  ETHANOL  POC SARS CORONAVIRUS 2 AG -  ED    EKG None  Radiology CT Head Wo Contrast  Result Date: 06/25/2020 CLINICAL DATA:  Polytrauma, critical, head/C-spine injury suspected; Facial trauma Polytrauma, critical, head/C-spine injury suspected Frequent falls. EXAM: CT HEAD WITHOUT CONTRAST CT CERVICAL SPINE WITHOUT CONTRAST TECHNIQUE: Multidetector CT imaging of the head and cervical spine was performed following the standard protocol without intravenous contrast. Multiplanar CT image reconstructions of the cervical spine were also generated. COMPARISON:  Head CT and brain MRI 06/18/2020, 1 week prior. FINDINGS: CT HEAD FINDINGS Brain: Stable degree of atrophy and chronic small vessel ischemia, advanced for age. No intracranial hemorrhage, mass effect, or midline shift. No hydrocephalus. The basilar cisterns are patent. 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 No evidence of territorial infarct or acute ischemia. No extra-axial or intracranial fluid collection. Vascular: Atherosclerosis of skullbase vasculature without hyperdense vessel or abnormal calcification. Skull: No fracture or focal lesion. Sinuses/Orbits: Paranasal sinuses and mastoid air cells are clear. The visualized orbits are unremarkable. Other: Left parietooccipital scalp hematoma. CT CERVICAL SPINE FINDINGS Alignment:  Straightening of normal lordosis. Broad-based rightward scoliotic curvature versus positioning. Questionable widening of right C5-C6 facet, unchanged from recent exam. Skull base and vertebrae: No acute fracture. Vertebral body heights are maintained. The dens and skull base are intact. Mild degenerative pannus at C1-C2. Soft tissues and spinal canal: No prevertebral fluid or swelling. No visible canal hematoma. Disc levels: Disc space narrowing and endplate spurring at every level, most prominent at C5-C6 and C6-C7. There is multilevel facet hypertrophy. No high-grade canal stenosis. Upper chest: Minimal emphysema. Other: None. IMPRESSION: 1. Left parietooccipital scalp hematoma. No acute intracranial abnormality. No skull fracture. 2. 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 3. Stable atrophy and chronic small vessel ischemia, advanced for age. 4. Multilevel degenerative change throughout the cervical spine without acute fracture or subluxation. Electronically Signed   By: Keith Rake M.D.   On: 06/25/2020 22:24   CT Cervical Spine Wo Contrast  Result Date: 06/25/2020 CLINICAL DATA:  Polytrauma, critical, head/C-spine injury suspected; Facial trauma Polytrauma, critical, head/C-spine injury suspected Frequent falls. EXAM: CT HEAD WITHOUT CONTRAST CT CERVICAL SPINE WITHOUT CONTRAST TECHNIQUE: Multidetector CT imaging of the head and cervical spine was performed following the standard protocol without intravenous contrast. Multiplanar CT image reconstructions of the cervical spine were also generated. COMPARISON:  Head CT and brain MRI 06/18/2020, 1 week prior. FINDINGS: CT HEAD FINDINGS Brain: Stable degree of atrophy and chronic small vessel ischemia, advanced for age. No intracranial hemorrhage, mass effect, or midline shift. No hydrocephalus. The basilar cisterns are patent. 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 No  evidence of territorial infarct or acute ischemia. No extra-axial or intracranial fluid collection. Vascular: Atherosclerosis of skullbase vasculature without hyperdense vessel or abnormal calcification. Skull: No fracture or focal lesion. Sinuses/Orbits: Paranasal sinuses and mastoid air cells are clear. The visualized orbits are unremarkable. Other: Left parietooccipital scalp hematoma. CT CERVICAL SPINE FINDINGS Alignment: Straightening of normal lordosis. Broad-based rightward scoliotic curvature versus positioning. Questionable widening of right C5-C6 facet, unchanged from recent exam. Skull base and vertebrae: No acute  fracture. Vertebral body heights are maintained. The dens and skull base are intact. Mild degenerative pannus at C1-C2. Soft tissues and spinal canal: No prevertebral fluid or swelling. No visible canal hematoma. Disc levels: Disc space narrowing and endplate spurring at every level, most prominent at C5-C6 and C6-C7. There is multilevel facet hypertrophy. No high-grade canal stenosis. Upper chest: Minimal emphysema. Other: None. IMPRESSION: 1. Left parietooccipital scalp hematoma. No acute intracranial abnormality. No skull fracture. 2. 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 3. Stable atrophy and chronic small vessel ischemia, advanced for age. 4. Multilevel degenerative change throughout the cervical spine without acute fracture or subluxation. Electronically Signed   By: Keith Rake M.D.   On: 06/25/2020 22:24    Procedures Procedures (including critical care time)  Medications Ordered in ED Medications  0.9 % NaCl with KCl 20 mEq/ L  infusion ( Intravenous New Bag/Given 06/25/20 2253)  magnesium sulfate IVPB 2 g 50 mL (2 g Intravenous New Bag/Given 06/25/20 2230)  LORazepam (ATIVAN) injection 0-4 mg (0 mg Intravenous Not Given 06/25/20 2256)    Or  LORazepam (ATIVAN) tablet 0-4 mg ( Oral See Alternative 06/25/20 2256)  LORazepam  (ATIVAN) injection 0-4 mg (has no administration in time range)    Or  LORazepam (ATIVAN) tablet 0-4 mg (has no administration in time range)  thiamine tablet 100 mg (has no administration in time range)    Or  thiamine (B-1) injection 100 mg (has no administration in time range)  sodium chloride 0.9 % bolus 1,000 mL (1,000 mLs Intravenous New Bag/Given 11/12/07 3235)  folic acid (FOLVITE) tablet 1 mg (1 mg Oral Given 06/25/20 2145)  thiamine tablet 100 mg (100 mg Oral Given 06/25/20 2145)    ED Course  I have reviewed the triage vital signs and the nursing notes.  Pertinent labs & imaging results that were available during my care of the patient were reviewed by me and considered in my medical decision making (see chart for details).    MDM Rules/Calculators/A&P                          11:16 PM Patient in similar condition. I have reviewed the patient's CT scans, no intracranial hemorrhage, no fractures. Patient's labs, however, multiple abnormalities including hypomagnesemia, hypokalemia, and AKI. Similar, urinary tract infection is not demonstrated, but the patient does have ketonuria suggesting ongoing dehydration, consistent with patient's malnutrition, ongoing alcohol use.  None here no evidence for DTs, no complicated withdrawal, but with consideration history she started on the CIWA protocol Patient started on potassium, magnesium repletion, fluids, received thiamine, folic acid, was discussed with our general medicine, for observation for additional electrolyte and fluid resuscitation. Final Clinical Impression(s) / ED Diagnoses Final diagnoses:  AKI (acute kidney injury) ([place])  Hypokalemia  Hypomagnesemia  Fall, initial encounter  Facial injury, initial encounter   MDM Number of Diagnoses or Management Options AKI (acute kidney injury) ([place]): new, needed workup Facial injury, initial encounter: new, needed workup Fall, initial encounter: new, needed workup Hypokalemia:  new, needed workup Hypomagnesemia: new, needed workup   Amount and/or Complexity of Data Reviewed Clinical lab tests: reviewed Tests in the radiology section of CPT: reviewed Tests in the medicine section of CPT: reviewed Decide to obtain previous medical records or to obtain history from someone other than the patient: yes Obtain history from someone other than the patient: yes Review and summarize past medical  records: yes Discuss the patient with other providers: yes Independent visualization of images, tracings, or specimens: yes  Risk of Complications, Morbidity, and/or Mortality Presenting problems: high Diagnostic procedures: high Management options: high  Critical Care Total time providing critical care: < 30 minutes  Patient Progress Patient progress: stable    Carmin Muskrat, MD 06/25/20 2318

## 2020-06-26 ENCOUNTER — Encounter (HOSPITAL_COMMUNITY): Payer: Self-pay | Admitting: Family Medicine

## 2020-06-26 DIAGNOSIS — E860 Dehydration: Secondary | ICD-10-CM | POA: Diagnosis present

## 2020-06-26 DIAGNOSIS — Z8249 Family history of ischemic heart disease and other diseases of the circulatory system: Secondary | ICD-10-CM | POA: Diagnosis not present

## 2020-06-26 DIAGNOSIS — N390 Urinary tract infection, site not specified: Secondary | ICD-10-CM | POA: Diagnosis present

## 2020-06-26 DIAGNOSIS — I2510 Atherosclerotic heart disease of native coronary artery without angina pectoris: Secondary | ICD-10-CM | POA: Diagnosis present

## 2020-06-26 DIAGNOSIS — R627 Adult failure to thrive: Secondary | ICD-10-CM | POA: Diagnosis present

## 2020-06-26 DIAGNOSIS — F419 Anxiety disorder, unspecified: Secondary | ICD-10-CM | POA: Diagnosis present

## 2020-06-26 DIAGNOSIS — J449 Chronic obstructive pulmonary disease, unspecified: Secondary | ICD-10-CM | POA: Diagnosis present

## 2020-06-26 DIAGNOSIS — Z83438 Family history of other disorder of lipoprotein metabolism and other lipidemia: Secondary | ICD-10-CM | POA: Diagnosis not present

## 2020-06-26 DIAGNOSIS — E876 Hypokalemia: Secondary | ICD-10-CM | POA: Diagnosis present

## 2020-06-26 DIAGNOSIS — Z801 Family history of malignant neoplasm of trachea, bronchus and lung: Secondary | ICD-10-CM | POA: Diagnosis not present

## 2020-06-26 DIAGNOSIS — W19XXXA Unspecified fall, initial encounter: Secondary | ICD-10-CM | POA: Diagnosis present

## 2020-06-26 DIAGNOSIS — Z20822 Contact with and (suspected) exposure to covid-19: Secondary | ICD-10-CM | POA: Diagnosis present

## 2020-06-26 DIAGNOSIS — F1010 Alcohol abuse, uncomplicated: Secondary | ICD-10-CM | POA: Diagnosis present

## 2020-06-26 DIAGNOSIS — R296 Repeated falls: Secondary | ICD-10-CM | POA: Diagnosis present

## 2020-06-26 DIAGNOSIS — I10 Essential (primary) hypertension: Secondary | ICD-10-CM | POA: Diagnosis present

## 2020-06-26 DIAGNOSIS — E46 Unspecified protein-calorie malnutrition: Secondary | ICD-10-CM | POA: Diagnosis present

## 2020-06-26 DIAGNOSIS — F17210 Nicotine dependence, cigarettes, uncomplicated: Secondary | ICD-10-CM | POA: Diagnosis present

## 2020-06-26 DIAGNOSIS — D7589 Other specified diseases of blood and blood-forming organs: Secondary | ICD-10-CM | POA: Diagnosis present

## 2020-06-26 DIAGNOSIS — Z880 Allergy status to penicillin: Secondary | ICD-10-CM | POA: Diagnosis not present

## 2020-06-26 DIAGNOSIS — R5383 Other fatigue: Secondary | ICD-10-CM | POA: Diagnosis present

## 2020-06-26 DIAGNOSIS — Z79899 Other long term (current) drug therapy: Secondary | ICD-10-CM | POA: Diagnosis not present

## 2020-06-26 DIAGNOSIS — N179 Acute kidney failure, unspecified: Secondary | ICD-10-CM | POA: Diagnosis not present

## 2020-06-26 DIAGNOSIS — Z818 Family history of other mental and behavioral disorders: Secondary | ICD-10-CM | POA: Diagnosis not present

## 2020-06-26 DIAGNOSIS — S0003XA Contusion of scalp, initial encounter: Secondary | ICD-10-CM | POA: Diagnosis present

## 2020-06-26 DIAGNOSIS — F32A Depression, unspecified: Secondary | ICD-10-CM | POA: Diagnosis present

## 2020-06-26 LAB — CBC
HCT: 32.2 % — ABNORMAL LOW (ref 36.0–46.0)
Hemoglobin: 11.1 g/dL — ABNORMAL LOW (ref 12.0–15.0)
MCH: 36.5 pg — ABNORMAL HIGH (ref 26.0–34.0)
MCHC: 34.5 g/dL (ref 30.0–36.0)
MCV: 105.9 fL — ABNORMAL HIGH (ref 80.0–100.0)
Platelets: 192 10*3/uL (ref 150–400)
RBC: 3.04 MIL/uL — ABNORMAL LOW (ref 3.87–5.11)
RDW: 12.8 % (ref 11.5–15.5)
WBC: 5.4 10*3/uL (ref 4.0–10.5)
nRBC: 0 % (ref 0.0–0.2)

## 2020-06-26 LAB — COMPREHENSIVE METABOLIC PANEL
ALT: 21 U/L (ref 0–44)
AST: 43 U/L — ABNORMAL HIGH (ref 15–41)
Albumin: 3.3 g/dL — ABNORMAL LOW (ref 3.5–5.0)
Alkaline Phosphatase: 56 U/L (ref 38–126)
Anion gap: 10 (ref 5–15)
BUN: 26 mg/dL — ABNORMAL HIGH (ref 8–23)
CO2: 24 mmol/L (ref 22–32)
Calcium: 8.3 mg/dL — ABNORMAL LOW (ref 8.9–10.3)
Chloride: 104 mmol/L (ref 98–111)
Creatinine, Ser: 1.11 mg/dL — ABNORMAL HIGH (ref 0.44–1.00)
GFR, Estimated: 52 mL/min — ABNORMAL LOW (ref >60)
Glucose, Bld: 96 mg/dL (ref 70–99)
Potassium: 3.2 mmol/L — ABNORMAL LOW (ref 3.5–5.1)
Sodium: 138 mmol/L (ref 135–145)
Total Bilirubin: 0.7 mg/dL (ref 0.3–1.2)
Total Protein: 5.8 g/dL — ABNORMAL LOW (ref 6.5–8.1)

## 2020-06-26 LAB — PHOSPHORUS: Phosphorus: 2.7 mg/dL (ref 2.5–4.6)

## 2020-06-26 LAB — MAGNESIUM: Magnesium: 2.1 mg/dL (ref 1.7–2.4)

## 2020-06-26 MED ORDER — ACETAMINOPHEN 650 MG RE SUPP: 650.0000 mg | Freq: Four times a day (QID) | RECTAL | Status: DC | PRN

## 2020-06-26 MED ORDER — POTASSIUM CHLORIDE CRYS ER 20 MEQ PO TBCR
20.0000 meq | EXTENDED_RELEASE_TABLET | Freq: Once | ORAL | Status: AC
  Administered 2020-06-26: 20 meq via ORAL
  Filled 2020-06-26: qty 1

## 2020-06-26 MED ORDER — LORAZEPAM 1 MG PO TABS: 0.0000 mg | ORAL_TABLET | Freq: Two times a day (BID) | ORAL | Status: AC

## 2020-06-26 MED ORDER — ACETAMINOPHEN 325 MG PO TABS: 650.0000 mg | ORAL_TABLET | Freq: Four times a day (QID) | ORAL | Status: DC | PRN

## 2020-06-26 MED ORDER — ROSUVASTATIN CALCIUM 10 MG PO TABS
10.0000 mg | ORAL_TABLET | Freq: Every day | ORAL | Status: DC
  Administered 2020-06-26 – 2020-06-29 (×4): 10 mg via ORAL
  Filled 2020-06-26 (×4): qty 1

## 2020-06-26 MED ORDER — MOMETASONE FURO-FORMOTEROL FUM 200-5 MCG/ACT IN AERO
2.0000 | INHALATION_SPRAY | Freq: Two times a day (BID) | RESPIRATORY_TRACT | Status: DC
  Administered 2020-06-26 – 2020-06-30 (×9): 2 via RESPIRATORY_TRACT
  Filled 2020-06-26: qty 8.8

## 2020-06-26 MED ORDER — THIAMINE HCL 100 MG PO TABS
100.0000 mg | ORAL_TABLET | Freq: Every day | ORAL | Status: DC
  Administered 2020-06-26 – 2020-06-30 (×5): 100 mg via ORAL
  Filled 2020-06-26 (×5): qty 1

## 2020-06-26 MED ORDER — SENNOSIDES-DOCUSATE SODIUM 8.6-50 MG PO TABS: 1.0000 | ORAL_TABLET | Freq: Every evening | ORAL | Status: DC | PRN

## 2020-06-26 MED ORDER — IRBESARTAN 300 MG PO TABS
300.0000 mg | ORAL_TABLET | Freq: Every day | ORAL | Status: DC
  Administered 2020-06-26 – 2020-06-30 (×5): 300 mg via ORAL
  Filled 2020-06-26 (×5): qty 1

## 2020-06-26 MED ORDER — FOLIC ACID 1 MG PO TABS
1.0000 mg | ORAL_TABLET | Freq: Every day | ORAL | Status: DC
  Administered 2020-06-26 – 2020-06-30 (×5): 1 mg via ORAL
  Filled 2020-06-26 (×5): qty 1

## 2020-06-26 MED ORDER — ONDANSETRON HCL 4 MG/2ML IJ SOLN: 4.0000 mg | Freq: Four times a day (QID) | INTRAMUSCULAR | Status: DC | PRN

## 2020-06-26 MED ORDER — LORAZEPAM 1 MG PO TABS: 1.0000 mg | ORAL_TABLET | ORAL | Status: AC | PRN

## 2020-06-26 MED ORDER — LORAZEPAM 1 MG PO TABS
0.0000 mg | ORAL_TABLET | Freq: Four times a day (QID) | ORAL | Status: AC
  Administered 2020-06-26: 1 mg via ORAL
  Filled 2020-06-26: qty 1

## 2020-06-26 MED ORDER — LORAZEPAM 2 MG/ML IJ SOLN: 1.0000 mg | INTRAMUSCULAR | Status: AC | PRN

## 2020-06-26 MED ORDER — THIAMINE HCL 100 MG/ML IJ SOLN: 100.0000 mg | Freq: Every day | INTRAMUSCULAR | Status: DC

## 2020-06-26 MED ORDER — SODIUM CHLORIDE 0.9 % IV SOLN: INTRAVENOUS | Status: AC

## 2020-06-26 MED ORDER — BUPROPION HCL ER (XL) 150 MG PO TB24
150.0000 mg | ORAL_TABLET | Freq: Every day | ORAL | Status: DC
  Administered 2020-06-26 – 2020-06-27 (×3): 150 mg via ORAL
  Filled 2020-06-26 (×3): qty 1

## 2020-06-26 MED ORDER — ADULT MULTIVITAMIN W/MINERALS CH
1.0000 | ORAL_TABLET | Freq: Every day | ORAL | Status: DC
  Administered 2020-06-26 – 2020-06-30 (×5): 1 via ORAL
  Filled 2020-06-26 (×5): qty 1

## 2020-06-26 MED ORDER — ESCITALOPRAM OXALATE 20 MG PO TABS
20.0000 mg | ORAL_TABLET | Freq: Every day | ORAL | Status: DC
  Administered 2020-06-26 – 2020-06-30 (×5): 20 mg via ORAL
  Filled 2020-06-26 (×2): qty 1
  Filled 2020-06-26 (×2): qty 2
  Filled 2020-06-26: qty 1

## 2020-06-26 MED ORDER — ONDANSETRON HCL 4 MG PO TABS: 4.0000 mg | ORAL_TABLET | Freq: Four times a day (QID) | ORAL | Status: DC | PRN

## 2020-06-26 MED ORDER — LABETALOL HCL 5 MG/ML IV SOLN
10.0000 mg | INTRAVENOUS | Status: DC | PRN
  Administered 2020-06-26 – 2020-06-27 (×5): 10 mg via INTRAVENOUS
  Filled 2020-06-26 (×5): qty 4

## 2020-06-26 NOTE — H&P (Signed)
History and Physical    Crystal Noble [id number]:[id number] DOB: 1946-02-21 DOA: 06/25/2020  PCP: Fanny Bien, MD   Patient coming from: Home   Chief Complaint: Falls, alcohol problem  HPI: Crystal Noble is a 75 y.o. female with medical history significant for hypertension, CAD, PAD, asymptomatic carotid artery stenosis, and alcohol abuse, presented to the emergency department, brought in by a friend for evaluation of recurrent falls, alcohol abuse, and poor nutrition.  Patient was seen in the emergency department after a fall about a week ago, had extensive work-up including CTA head and neck and MRI brain, was treated for UTI though she denies any recent fevers, dysuria, or flank pain, and returned home.  Since returning home, she has had another fall, does not remember exactly how it occurred but denies losing consciousness and reports that she had had approximately 8 ounces of liquor prior to the fall.  A friend who accompanied her to the ED raise concern that the patient drinks alcohol throughout the day and does not eat or drink much else.  Patient denies feeling particularly depressed or having any intention of harming herself.  She denies any history of alcohol withdrawal and denies any hallucinations.  ED Course: Upon arrival to the ED, patient is found to be afebrile, saturating well on room air, slightly tachycardic, and with stable blood pressure.  Chemistry panel is notable for potassium 3.0, magnesium 1.5, and creatinine 1.33, up from 0.90 a week earlier.  CBC notable for macrocytosis without anemia.  Ethanol undetectable and COVID antigen is negative.  Patient was given 2 L of IV fluids, IV potassium, and IV magnesium.   Review of Systems:  All other systems reviewed and apart from HPI, are negative.  Past Medical History:  Diagnosis Date  . Carotid stenosis, asymptomatic   . Colitis, collagenous   . Hypertension     Past Surgical History:  Procedure Laterality Date   . APPENDECTOMY      Social History:   reports that she has been smoking cigarettes. She has a 15.00 pack-year smoking history. She has never used smokeless tobacco. She reports current alcohol use of about 21.0 standard drinks of alcohol per week. She reports that she does not use drugs.  Allergies  Allergen Reactions  . Penicillin G Rash    Family History  Problem Relation Age of Onset  . Hypertension Mother   . Hyperlipidemia Mother   . Heart attack Mother   . Depression Mother        Bi-Polar  . Cancer Father        Lung     Prior to Admission medications   Medication Sig Start Date End Date Taking? Authorizing Provider  buPROPion (WELLBUTRIN XL) 150 MG 24 hr tablet Take 150 mg by mouth at bedtime. 06/13/20  Yes [provider]  escitalopram (LEXAPRO) 20 MG tablet Take 20 mg by mouth daily. 03/17/20  Yes [provider]  estradiol (ESTRACE) 0.1 MG/GM vaginal cream Place 1 Applicatorful vaginally at bedtime. 06/23/20  Yes [provider]  fluticasone-salmeterol (ADVAIR HFA) 230-21 MCG/ACT inhaler Inhale 2 puffs into the lungs 2 (two) times daily.   Yes [provider]  megestrol (MEGACE) 40 MG tablet Take 40 mg by mouth in the morning, at noon, and at bedtime.   Yes [provider]  rosuvastatin (CRESTOR) 10 MG tablet Take 10 mg by mouth at bedtime. 04/26/20  Yes [provider]  sulfamethoxazole-trimethoprim (BACTRIM DS) 800-160 MG tablet Take 1  tablet by mouth 2 (two) times daily.   Yes [provider]  valsartan-hydrochlorothiazide (DIOVAN-HCT) 320-25 MG tablet Take 1 tablet by mouth daily. 04/14/20  Yes [provider]    Physical Exam: Vitals:   06/25/20 2254 06/25/20 2257 06/25/20 2300 06/25/20 2330  BP: (!) 172/79  (!) 168/69 135/79  Pulse: 74  75 82  Resp:   18 14  Temp:      TempSrc:      SpO2:   95% 100%  Weight:  38.6 kg    Height:  5' (1.524 m)      Constitutional: NAD, calm  Eyes:  PERTLA, periorbital ecchymosis  ENMT: Mucous membranes are moist. Posterior pharynx clear of any exudate or lesions.   Neck: normal, supple, no masses, no thyromegaly Respiratory:  no wheezing, no crackles. No accessory muscle use.  Cardiovascular: S1 & S2 heard, regular rate and rhythm. No extremity edema.   Abdomen: No distension, no tenderness, soft. Bowel sounds active.  Musculoskeletal: periorbital edema and ecchymosis. No joint deformity upper and lower extremities.   Skin: no significant rashes, lesions, ulcers. Warm, dry, well-perfused. Neurologic: CN 2-12 grossly intact. Sensation intact. Strength 5/5 in all extremities.  Psychiatric: Alert and oriented to person, place, and situation. Calm and cooperative.    Labs and Imaging on Admission: I have personally reviewed following labs and imaging studies  CBC: Recent Labs  Lab 06/25/20 1737  WBC 6.6  HGB 12.9  HCT 36.7  MCV 102.8*  PLT 0000000   Basic Metabolic Panel: Recent Labs  Lab 06/25/20 1737 06/25/20 2134  NA 135  --   K 3.0*  --   CL 93*  --   CO2 24  --   GLUCOSE 111*  --   BUN 32*  --   CREATININE 1.33*  --   CALCIUM 9.4  --   MG  --  1.5*   GFR: Estimated Creatinine Clearance: 22.6 mL/min (A) (by C-G formula based on SCr of 1.33 mg/dL (H)). Liver Function Tests: No results for input(s): AST, ALT, ALKPHOS, BILITOT, PROT, ALBUMIN in the last 168 hours. No results for input(s): LIPASE, AMYLASE in the last 168 hours. No results for input(s): AMMONIA in the last 168 hours. Coagulation Profile: No results for input(s): INR, PROTIME in the last 168 hours. Cardiac Enzymes: No results for input(s): CKTOTAL, CKMB, CKMBINDEX, TROPONINI in the last 168 hours. BNP (last 3 results) No results for input(s): PROBNP in the last 8760 hours. HbA1C: No results for input(s): HGBA1C in the last 72 hours. CBG: No results for input(s): GLUCAP in the last 168 hours. Lipid Profile: No results for input(s): CHOL, HDL,  LDLCALC, TRIG, CHOLHDL, LDLDIRECT in the last 72 hours. Thyroid Function Tests: No results for input(s): TSH, T4TOTAL, FREET4, T3FREE, THYROIDAB in the last 72 hours. Anemia Panel: No results for input(s): VITAMINB12, FOLATE, FERRITIN, TIBC, IRON, RETICCTPCT in the last 72 hours. Urine analysis:    Component Value Date/Time   COLORURINE YELLOW 06/25/2020 2237   APPEARANCEUR HAZY (A) 06/25/2020 2237   LABSPEC 1.021 06/25/2020 2237   PHURINE 5.0 06/25/2020 2237   GLUCOSEU NEGATIVE 06/25/2020 2237   HGBUR NEGATIVE 06/25/2020 2237   BILIRUBINUR NEGATIVE 06/25/2020 2237   KETONESUR 5 (A) 06/25/2020 2237   PROTEINUR 30 (A) 06/25/2020 2237   NITRITE NEGATIVE 06/25/2020 2237   LEUKOCYTESUR TRACE (A) 06/25/2020 2237   Sepsis Labs: @LABRCNTIP (procalcitonin:4,lacticidven:4) )No results found for this or any previous visit (from the past 240 hour(s)).   Radiological Exams  on Admission: CT Head Wo Contrast  Result Date: 06/25/2020 CLINICAL DATA:  Polytrauma, critical, head/C-spine injury suspected; Facial trauma Polytrauma, critical, head/C-spine injury suspected Frequent falls. EXAM: CT HEAD WITHOUT CONTRAST CT CERVICAL SPINE WITHOUT CONTRAST TECHNIQUE: Multidetector CT imaging of the head and cervical spine was performed following the standard protocol without intravenous contrast. Multiplanar CT image reconstructions of the cervical spine were also generated. COMPARISON:  Head CT and brain MRI 06/18/2020, 1 week prior. FINDINGS: CT HEAD FINDINGS Brain: Stable degree of atrophy and chronic small vessel ischemia, advanced for age. No intracranial hemorrhage, mass effect, or midline shift. No hydrocephalus. The basilar cisterns are patent. 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 No evidence of territorial infarct or acute ischemia. No extra-axial or intracranial fluid collection. Vascular: Atherosclerosis of skullbase vasculature without hyperdense vessel or  abnormal calcification. Skull: No fracture or focal lesion. Sinuses/Orbits: Paranasal sinuses and mastoid air cells are clear. The visualized orbits are unremarkable. Other: Left parietooccipital scalp hematoma. CT CERVICAL SPINE FINDINGS Alignment: Straightening of normal lordosis. Broad-based rightward scoliotic curvature versus positioning. Questionable widening of right C5-C6 facet, unchanged from recent exam. Skull base and vertebrae: No acute fracture. Vertebral body heights are maintained. The dens and skull base are intact. Mild degenerative pannus at C1-C2. Soft tissues and spinal canal: No prevertebral fluid or swelling. No visible canal hematoma. Disc levels: Disc space narrowing and endplate spurring at every level, most prominent at C5-C6 and C6-C7. There is multilevel facet hypertrophy. No high-grade canal stenosis. Upper chest: Minimal emphysema. Other: None. IMPRESSION: 1. Left parietooccipital scalp hematoma. No acute intracranial abnormality. No skull fracture. 2. 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 3. Stable atrophy and chronic small vessel ischemia, advanced for age. 4. Multilevel degenerative change throughout the cervical spine without acute fracture or subluxation. Electronically Signed   By: Keith Rake M.D.   On: 06/25/2020 22:24   CT Cervical Spine Wo Contrast  Result Date: 06/25/2020 CLINICAL DATA:  Polytrauma, critical, head/C-spine injury suspected; Facial trauma Polytrauma, critical, head/C-spine injury suspected Frequent falls. EXAM: CT HEAD WITHOUT CONTRAST CT CERVICAL SPINE WITHOUT CONTRAST TECHNIQUE: Multidetector CT imaging of the head and cervical spine was performed following the standard protocol without intravenous contrast. Multiplanar CT image reconstructions of the cervical spine were also generated. COMPARISON:  Head CT and brain MRI 06/18/2020, 1 week prior. FINDINGS: CT HEAD FINDINGS Brain: Stable degree of  atrophy and chronic small vessel ischemia, advanced for age. No intracranial hemorrhage, mass effect, or midline shift. No hydrocephalus. The basilar cisterns are patent. 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 No evidence of territorial infarct or acute ischemia. No extra-axial or intracranial fluid collection. Vascular: Atherosclerosis of skullbase vasculature without hyperdense vessel or abnormal calcification. Skull: No fracture or focal lesion. Sinuses/Orbits: Paranasal sinuses and mastoid air cells are clear. The visualized orbits are unremarkable. Other: Left parietooccipital scalp hematoma. CT CERVICAL SPINE FINDINGS Alignment: Straightening of normal lordosis. Broad-based rightward scoliotic curvature versus positioning. Questionable widening of right C5-C6 facet, unchanged from recent exam. Skull base and vertebrae: No acute fracture. Vertebral body heights are maintained. The dens and skull base are intact. Mild degenerative pannus at C1-C2. Soft tissues and spinal canal: No prevertebral fluid or swelling. No visible canal hematoma. Disc levels: Disc space narrowing and endplate spurring at every level, most prominent at C5-C6 and C6-C7. There is multilevel facet hypertrophy. No high-grade canal stenosis.  Upper chest: Minimal emphysema. Other: None. IMPRESSION: 1. Left parietooccipital scalp hematoma. No acute intracranial abnormality. No skull fracture. 2. 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 3. Stable atrophy and chronic small vessel ischemia, advanced for age. 4. Multilevel degenerative change throughout the cervical spine without acute fracture or subluxation. Electronically Signed   By: Keith Rake M.D.   On: 06/25/2020 22:24    Assessment/Plan   1. Acute kidney injury  - SCr is 1.33 in ED, up from 0.90 a few days earlier  - Hold losartan-HCTZ, continue IVF hydration, repeat chem panel in am    2.  Hypokalemia, hypomagnesemia  - Serum potassium is 3.0 and magnesium 1.5 in ED  - Likely related to poor nutrition, alcoholism  - Given IV potassium and magnesium in ED  - Give additional oral potassium, repeat chemistries in am    3. Alcohol abuse  - Patient reports ~8 oz brandy every day, denies feeling depressed, denies hx of withdrawal  - Monitor with CIWA, use Ativan if needed, supplement vitamins and replace electrolytes, consult SW   4. Falls  - Patient has had recent falls  - No acute CT head findings, no focal neurologic deficits  - Likely secondary to alcohol intoxication  - Encourage alcohol avoidance, consult PT for eval and tx    5. CAD; PAD; CAS  - No anginal complaints, continue statin, smoking-cessation encouraged   6. Hypertension  - Hold losartan-HCTZ in light of AKI, treat as needed only for now   7. COPD  - No cough or wheezing on admission  - Continue ICS/LABA    8. Depression, anxiety  - Continue Lexapro and Wellbutrin     DVT prophylaxis: SCDs Code Status: Full  Family Communication: Discussed with patient  Disposition Plan:  Patient is from: Home  Anticipated d/c is to: TBD Anticipated d/c date is: 1/14 or 06/27/20 Patient currently: pending improvement in renal function and electrolytes, PT assessment  Consults called: None  Admission status: Observation     Vianne Bulls, MD [facility]  06/26/2020, 1:16 AM

## 2020-06-26 NOTE — ED Notes (Signed)
Dr. Karleen Hampshire made aware of patients high blood pressure after dose of Labetalol given at 1830.

## 2020-06-26 NOTE — ED Notes (Addendum)
Patient received PT at this time walking around foot of bed with one person assist

## 2020-06-26 NOTE — Progress Notes (Signed)
PROGRESS NOTE    Crystal Noble  KZS:[id number] DOB: 01-04-1946 DOA: 06/25/2020 PCP: Fanny Bien, MD    Chief Complaint  Patient presents with  . Fatigue  . frequent falls  . UTI  . Failure To Thrive    Brief Narrative:   75 y.o. female with medical history significant for hypertension, CAD, PAD, asymptomatic carotid artery stenosis, and alcohol abuse, presented to the emergency department, brought in by a friend for evaluation of recurrent falls, alcohol abuse, and poor nutrition. :A friend who accompanied her to the ED raised concern that the patient drinks alcohol throughout the day and does not eat or drink much else.  Patient denies feeling particularly depressed or having any intention of harming herself.  She denies any history of alcohol withdrawal and denies any hallucinations. She was found to be hypokalemic, in AKI, . CT head and cervical spine showed Left parietooccipital scalp hematoma. No acute intracranial abnormality. No skull fracture. 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 Moderate chronic microvascular ischemic changes. She has periorbital ecchymosis from mulitple falls.  PT/OT EVAL ordered.    Assessment & Plan  Principal Problem:   AKI (acute kidney injury) ([place]) Active Problems:   Coronary artery disease involving native coronary artery of native heart without angina pectoris   Essential hypertension   Hypokalemia   Hypomagnesemia   Alcohol abuse   Anxiety disorder   COPD (chronic obstructive pulmonary disease) ([place])   Falls   Alcohol abuse Patient reports drinking brandy every day. Denies any withdrawal symptoms at this time. Monitor patient on CIWA protocol. TOC consult for drug abuse.    Multiple falls recently. Patient has periorbital ecchymosis. CT of the head shows scalp hematoma. Recent MRI shows subacute infarct in the right corona radiator. PT OT evaluation  ordered.    Persistent hypokalemia and hypomagnesemia Replaced.    AKI probably secondary to dehydration poor oral intake. Improving with IV fluids. Recommend IV fluids for another 24 to 48 hours and repeat renal parameters in the morning.   Primary hypertension Suboptimally controlled blood pressure parameters. Continue holding hydrochlorothiazide, resume Avapro and hydralazine as needed.    COPD No signs of exacerbation at this time. Continue with bronchodilators as needed.    Depression and anxiety Continue with home medications at this time.   DVT prophylaxis: (Lovenox) Code Status: (Full Code) Family Communication: none at bedside Disposition:   Status is: Observation  The patient will require care spanning > 2 midnights and should be moved to inpatient because: Unsafe d/c plan and IV treatments appropriate due to intensity of illness or inability to take PO  Dispo: The patient is from: Home              Anticipated d/c is to: Home              Anticipated d/c date is: 2 days              Patient currently is not medically stable to d/c.       Consultants:   none   Procedures: None.   Antimicrobials: none.    Subjective: No new complaints.   Objective: Vitals:   06/26/20 0941 06/26/20 0942 06/26/20 1030 06/26/20 1100  BP:  (!) 176/93 (!) 185/85 (!) 198/88  Pulse:  73 69 74  Resp:  16  16  Temp:      TempSrc:  SpO2: 99% 97% 96% 97%  Weight:      Height:        Intake/Output Summary (Last 24 hours) at 06/26/2020 1326 Last data filed at 06/26/2020 0130 Gross per 24 hour  Intake 2055.89 ml  Output -  Net 2055.89 ml   Filed Weights   06/25/20 1732 06/25/20 2257  Weight: 38.6 kg 38.6 kg    Examination:  General exam: well developed lady with left peri orbital ecchymosis, periorbital edema.  Respiratory system: Clear to auscultation. Respiratory effort normal. Cardiovascular system: S1 & S2 heard,tachycardic, . No pedal  edema. Gastrointestinal system: Abdomen is nondistended, soft and nontender.Normal bowel sounds heard. Central nervous system: Alert and oriented. No focal neurological deficits. Extremities: Symmetric 5 x 5 power. Skin: No rashes, lesions or ulcers Psychiatry:  Mood & affect appropriate.     Data Reviewed: I have personally reviewed following labs and imaging studies  CBC: Recent Labs  Lab 06/25/20 1737 06/26/20 0313  WBC 6.6 5.4  HGB 12.9 11.1*  HCT 36.7 32.2*  MCV 102.8* 105.9*  PLT 243 128    Basic Metabolic Panel: Recent Labs  Lab 06/25/20 1737 06/25/20 2134 06/26/20 0313  NA 135  --  138  K 3.0*  --  3.2*  CL 93*  --  104  CO2 24  --  24  GLUCOSE 111*  --  96  BUN 32*  --  26*  CREATININE 1.33*  --  1.11*  CALCIUM 9.4  --  8.3*  MG  --  1.5* 2.1  PHOS  --   --  2.7    GFR: Estimated Creatinine Clearance: 27.1 mL/min (A) (by C-G formula based on SCr of 1.11 mg/dL (H)).  Liver Function Tests: Recent Labs  Lab 06/26/20 0313  AST 43*  ALT 21  ALKPHOS 56  BILITOT 0.7  PROT 5.8*  ALBUMIN 3.3*    CBG: No results for input(s): GLUCAP in the last 168 hours.   No results found for this or any previous visit (from the past 240 hour(s)).       Radiology Studies: CT Head Wo Contrast  Result Date: 06/25/2020 CLINICAL DATA:  Polytrauma, critical, head/C-spine injury suspected; Facial trauma Polytrauma, critical, head/C-spine injury suspected Frequent falls. EXAM: CT HEAD WITHOUT CONTRAST CT CERVICAL SPINE WITHOUT CONTRAST TECHNIQUE: Multidetector CT imaging of the head and cervical spine was performed following the standard protocol without intravenous contrast. Multiplanar CT image reconstructions of the cervical spine were also generated. COMPARISON:  Head CT and brain MRI 06/18/2020, 1 week prior. FINDINGS: CT HEAD FINDINGS Brain: Stable degree of atrophy and chronic small vessel ischemia, advanced for age. No intracranial hemorrhage, mass effect, or  midline shift. No hydrocephalus. The basilar cisterns are patent. 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 No evidence of territorial infarct or acute ischemia. No extra-axial or intracranial fluid collection. Vascular: Atherosclerosis of skullbase vasculature without hyperdense vessel or abnormal calcification. Skull: No fracture or focal lesion. Sinuses/Orbits: Paranasal sinuses and mastoid air cells are clear. The visualized orbits are unremarkable. Other: Left parietooccipital scalp hematoma. CT CERVICAL SPINE FINDINGS Alignment: Straightening of normal lordosis. Broad-based rightward scoliotic curvature versus positioning. Questionable widening of right C5-C6 facet, unchanged from recent exam. Skull base and vertebrae: No acute fracture. Vertebral body heights are maintained. The dens and skull base are intact. Mild degenerative pannus at C1-C2. Soft tissues and spinal canal: No prevertebral fluid or swelling. No visible canal hematoma. Disc levels: Disc space narrowing  and endplate spurring at every level, most prominent at C5-C6 and C6-C7. There is multilevel facet hypertrophy. No high-grade canal stenosis. Upper chest: Minimal emphysema. Other: None. IMPRESSION: 1. Left parietooccipital scalp hematoma. No acute intracranial abnormality. No skull fracture. 2. 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 3. Stable atrophy and chronic small vessel ischemia, advanced for age. 4. Multilevel degenerative change throughout the cervical spine without acute fracture or subluxation. Electronically Signed   By: Keith Rake M.D.   On: 06/25/2020 22:24   CT Cervical Spine Wo Contrast  Result Date: 06/25/2020 CLINICAL DATA:  Polytrauma, critical, head/C-spine injury suspected; Facial trauma Polytrauma, critical, head/C-spine injury suspected Frequent falls. EXAM: CT HEAD WITHOUT CONTRAST CT CERVICAL SPINE WITHOUT CONTRAST TECHNIQUE:  Multidetector CT imaging of the head and cervical spine was performed following the standard protocol without intravenous contrast. Multiplanar CT image reconstructions of the cervical spine were also generated. COMPARISON:  Head CT and brain MRI 06/18/2020, 1 week prior. FINDINGS: CT HEAD FINDINGS Brain: Stable degree of atrophy and chronic small vessel ischemia, advanced for age. No intracranial hemorrhage, mass effect, or midline shift. No hydrocephalus. The basilar cisterns are patent. 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 No evidence of territorial infarct or acute ischemia. No extra-axial or intracranial fluid collection. Vascular: Atherosclerosis of skullbase vasculature without hyperdense vessel or abnormal calcification. Skull: No fracture or focal lesion. Sinuses/Orbits: Paranasal sinuses and mastoid air cells are clear. The visualized orbits are unremarkable. Other: Left parietooccipital scalp hematoma. CT CERVICAL SPINE FINDINGS Alignment: Straightening of normal lordosis. Broad-based rightward scoliotic curvature versus positioning. Questionable widening of right C5-C6 facet, unchanged from recent exam. Skull base and vertebrae: No acute fracture. Vertebral body heights are maintained. The dens and skull base are intact. Mild degenerative pannus at C1-C2. Soft tissues and spinal canal: No prevertebral fluid or swelling. No visible canal hematoma. Disc levels: Disc space narrowing and endplate spurring at every level, most prominent at C5-C6 and C6-C7. There is multilevel facet hypertrophy. No high-grade canal stenosis. Upper chest: Minimal emphysema. Other: None. IMPRESSION: 1. Left parietooccipital scalp hematoma. No acute intracranial abnormality. No skull fracture. 2. 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 3. Stable atrophy and chronic small vessel ischemia, advanced for age. 4. Multilevel degenerative change  throughout the cervical spine without acute fracture or subluxation. Electronically Signed   By: Keith Rake M.D.   On: 06/25/2020 22:24        Scheduled Meds: . buPROPion  150 mg Oral QHS  . escitalopram  20 mg Oral Daily  . folic acid  1 mg Oral Daily  . irbesartan  300 mg Oral Daily  . LORazepam  0-4 mg Oral Q6H   Followed by  . [START ON 06/28/2020] LORazepam  0-4 mg Oral Q12H  . mometasone-formoterol  2 puff Inhalation BID  . multivitamin with minerals  1 tablet Oral Daily  . potassium chloride  20 mEq Oral Once  . rosuvastatin  10 mg Oral QHS  . thiamine  100 mg Oral Daily   Or  . thiamine  100 mg Intravenous Daily   Continuous Infusions: . sodium chloride 125 mL/hr at 06/26/20 0211     LOS: 0 days        Hosie Poisson, MD [facility]   To contact the attending provider between 7A-7P or the covering provider during after hours 7P-7A, please log  into the web site [email or web address] and access using universal [place] password for that web site. If you do not have the password, please call the hospital operator.  06/26/2020, 1:26 PM

## 2020-06-26 NOTE — Evaluation (Signed)
Physical Therapy Evaluation Patient Details Name: Crystal Noble MRN: [id number] DOB: 10-10-1945 Today's Date: 06/26/2020   History of Present Illness  HPI: Crystal Noble is a 75 y.o. female with medical history significant for hypertension, CAD, PAD, asymptomatic carotid artery stenosis, and alcohol abuse, presented to the emergency department, brought in by a friend for evaluation of recurrent falls, alcohol abuse, and poor nutrition.  Patient was seen in the emergency department after a fall about a week ago, had extensive work-up including CTA head and neck and MRI brain, was treated for UTI  Clinical Impression  The patient is very flat and frank with her answers to orientation and prior function. Patient's cousin present and states that she can assist the patient after DC. Patient did ambulate in room with min   Assistance to  Hand hold. Patient currently will benefit from someone assisting her at DC. Recommend HHPT if patient will accept. Pt admitted with above diagnosis.  Pt currently with functional limitations due to the deficits listed below (see PT Problem List). Pt will benefit from skilled PT to increase their independence and safety with mobility to allow discharge to the venue listed below.      Follow Up Recommendations No PT follow up    Equipment Recommendations  None recommended by PT    Recommendations for Other Services       Precautions / Restrictions Precautions Precautions: Fall      Mobility  Bed Mobility Overal bed mobility: Needs Assistance Bed Mobility: Rolling;Supine to Sit;Sit to Supine Rolling: Supervision   Supine to sit: Min guard Sit to supine: Min guard   General bed mobility comments: no assistance    Transfers Overall transfer level: Needs assistance Equipment used: 1 person hand held assist Transfers: Sit to/from Stand Sit to Stand: Min assist         General transfer comment: steady assist to stnad from stretcher.Able to  scoot self back onto the  stretcher.  Ambulation/Gait Ambulation/Gait assistance: Min guard;Min assist Gait Distance (Feet): 30 Feet Assistive device: 1 person hand held assist Gait Pattern/deviations: Step-through pattern;Drifts right/left     General Gait Details: patient bears weight, light support from bed and HHA  Stairs            Wheelchair Mobility    Modified Rankin (Stroke Patients Only)       Balance                                             Pertinent Vitals/Pain Pain Assessment: No/denies pain    Home Living Family/patient expects to be discharged to:: Private residence Living Arrangements: Alone;Other relatives Available Help at Discharge: Family;Available 24 hours/day Type of Home: House Home Access: Stairs to enter     Home Layout: One level Home Equipment: None Additional Comments: cousin in room and says she can assist. Patient says no    Prior Function Level of Independence: Independent               Hand Dominance        Extremity/Trunk Assessment   Upper Extremity Assessment Upper Extremity Assessment: Overall WFL for tasks assessed    Lower Extremity Assessment Lower Extremity Assessment: Generalized weakness    Cervical / Trunk Assessment Cervical / Trunk Assessment: Kyphotic  Communication   Communication: No difficulties  Cognition Arousal/Alertness: Awake/alert Behavior During Therapy: WFL for  tasks assessed/performed;Flat affect Overall Cognitive Status: Impaired/Different from baseline Area of Impairment: Orientation                 Orientation Level: Time             General Comments: states [place]. very short and curt answers, C      General Comments      Exercises     Assessment/Plan    PT Assessment Patient needs continued PT services  PT Problem List Decreased strength;Decreased mobility;Decreased activity tolerance;Decreased balance;Decreased knowledge of  use of DME;Decreased safety awareness       PT Treatment Interventions DME instruction;Therapeutic activities;Gait training;Therapeutic exercise;Patient/family education;Functional mobility training;Balance training    PT Goals (Current goals can be found in the Care Plan section)  Acute Rehab PT Goals Patient Stated Goal: to go home PT Goal Formulation: With patient/family Time For Goal Achievement: 07/10/20 Potential to Achieve Goals: Good    Frequency Min 3X/week   Barriers to discharge        Co-evaluation               AM-PAC PT "6 Clicks" Mobility  Outcome Measure Help needed turning from your back to your side while in a flat bed without using bedrails?: A Little Help needed moving from lying on your back to sitting on the side of a flat bed without using bedrails?: A Little Help needed moving to and from a bed to a chair (including a wheelchair)?: A Little Help needed standing up from a chair using your arms (e.g., wheelchair or bedside chair)?: A Little Help needed to walk in hospital room?: A Little Help needed climbing 3-5 steps with a railing? : A Little 6 Click Score: 18    End of Session   Activity Tolerance: Patient tolerated treatment well Patient left: in bed;with call bell/phone within reach;with family/visitor present Nurse Communication: Mobility status PT Visit Diagnosis: Unsteadiness on feet (R26.81);History of falling (Z91.81);Difficulty in walking, not elsewhere classified (R26.2)    Time: 0174-9449 PT Time Calculation (min) (ACUTE ONLY): 36 min   Charges:   PT Evaluation $PT Eval Low Complexity: 1 Low PT Treatments $Therapeutic Activity: 8-22 mins $Self Care/Home Management: 8-22        [place] Pager [phone] Office [phone]   Claretha Cooper 06/26/2020, 1:47 PM

## 2020-06-27 DIAGNOSIS — F1010 Alcohol abuse, uncomplicated: Secondary | ICD-10-CM | POA: Diagnosis not present

## 2020-06-27 DIAGNOSIS — J449 Chronic obstructive pulmonary disease, unspecified: Secondary | ICD-10-CM | POA: Diagnosis not present

## 2020-06-27 DIAGNOSIS — F419 Anxiety disorder, unspecified: Secondary | ICD-10-CM | POA: Diagnosis not present

## 2020-06-27 DIAGNOSIS — N179 Acute kidney failure, unspecified: Secondary | ICD-10-CM | POA: Diagnosis not present

## 2020-06-27 LAB — BASIC METABOLIC PANEL
Anion gap: 13 (ref 5–15)
BUN: 15 mg/dL (ref 8–23)
CO2: 24 mmol/L (ref 22–32)
Calcium: 9.1 mg/dL (ref 8.9–10.3)
Chloride: 98 mmol/L (ref 98–111)
Creatinine, Ser: 0.87 mg/dL (ref 0.44–1.00)
GFR, Estimated: >60 mL/min (ref >60)
Glucose, Bld: 96 mg/dL (ref 70–99)
Potassium: 3.7 mmol/L (ref 3.5–5.1)
Sodium: 135 mmol/L (ref 135–145)

## 2020-06-27 MED ORDER — AMLODIPINE BESYLATE 10 MG PO TABS
10.0000 mg | ORAL_TABLET | Freq: Every day | ORAL | Status: DC
  Administered 2020-06-27 – 2020-06-30 (×4): 10 mg via ORAL
  Filled 2020-06-27: qty 1
  Filled 2020-06-27: qty 2
  Filled 2020-06-27 (×2): qty 1

## 2020-06-27 MED ORDER — NICOTINE 21 MG/24HR TD PT24
21.0000 mg | MEDICATED_PATCH | Freq: Every day | TRANSDERMAL | Status: DC
  Administered 2020-06-27 – 2020-06-30 (×4): 21 mg via TRANSDERMAL
  Filled 2020-06-27 (×4): qty 1

## 2020-06-27 MED ORDER — ASPIRIN 325 MG PO TABS
325.0000 mg | ORAL_TABLET | Freq: Every day | ORAL | Status: DC
  Administered 2020-06-27 – 2020-06-30 (×4): 325 mg via ORAL
  Filled 2020-06-27 (×4): qty 1

## 2020-06-27 NOTE — Progress Notes (Signed)
PROGRESS NOTE    Crystal Noble  [id number] DOB: Aug 02, 1945 DOA: 06/25/2020 PCP: Fanny Bien, MD    Chief Complaint  Patient presents with  . Fatigue  . frequent falls  . UTI  . Failure To Thrive    Brief Narrative:   75 y.o. female with medical history significant for hypertension, CAD, PAD, asymptomatic carotid artery stenosis, and alcohol abuse, presented to the emergency department, brought in by a friend for evaluation of recurrent falls, alcohol abuse, and poor nutrition. :A friend who accompanied her to the ED raised concern that the patient drinks alcohol throughout the day and does not eat or drink much else.  Patient denies feeling particularly depressed or having any intention of harming herself.  She denies any history of alcohol withdrawal and denies any hallucinations. She was found to be hypokalemic, in AKI, . CT head and cervical spine showed Left parietooccipital scalp hematoma. No acute intracranial abnormality. No skull fracture. 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 Moderate chronic microvascular ischemic changes. She has periorbital ecchymosis from mulitple falls.  PT/OT EVAL ordered. Recommended home health PT on discharge . But patient is refusing any home health.    Assessment & Plan  Principal Problem:   AKI (acute kidney injury) ([place]) Active Problems:   Coronary artery disease involving native coronary artery of native heart without angina pectoris   Essential hypertension   Hypokalemia   Hypomagnesemia   Alcohol abuse   Anxiety disorder   COPD (chronic obstructive pulmonary disease) ([place])   Falls   Alcohol abuse Patient reports drinking brandy every day. Denies any withdrawal symptoms at this time. Monitor patient on CIWA protocol. TOC consult for resources for alcohol abuse.     Multiple falls recently. Patient has periorbital ecchymosis. CT of the head shows  scalp hematoma. Recent MRI shows subacute infarct in the right corona radiator. PT OT evaluation ordered. Recommended home health PT, but pt politely refused.     Persistent hypokalemia and hypomagnesemia Replaced.  Repeat labs wnl.     AKI probably secondary to dehydration poor oral intake. Improving with IV fluids. Creatinine back to baseline.    Primary hypertension Hypertensive urgency.  Added Norvasc 10 mg daily.  Continue holding hydrochlorothiazide, resume Avapro and hydralazine as needed.    COPD No signs of exacerbation at this time. Continue with bronchodilators as needed.    Depression and anxiety Continue with home medications at this time.   DVT prophylaxis: (Lovenox) Code Status: (Full Code) Family Communication: none at bedside Disposition:   Status is: Inpatient.   The patient will require care spanning > 2 midnights and should be moved to inpatient because: Unsafe d/c plan and IV treatments appropriate due to intensity of illness or inability to take PO, will need normalization of BP parameters prior to discharge.   Dispo: The patient is from: Home              Anticipated d/c is to: Home              Anticipated d/c date is: 1 day              Patient currently is not medically stable to d/c.       Consultants:   none   Procedures: None.   Antimicrobials: none.    Subjective: Reports headache, no dizziness.   Objective: Vitals:   06/27/20 0906 06/27/20 0911 06/27/20 1230  06/27/20 1252  BP: (!) 185/95 (!) 185/95 (!) 171/82 (!) 171/82  Pulse: 68 77 95 95  Resp:  16 18   Temp:      TempSrc:      SpO2:  96% 96%   Weight:      Height:        Intake/Output Summary (Last 24 hours) at 06/27/2020 1255 Last data filed at 06/26/2020 1427 Gross per 24 hour  Intake 925.2 ml  Output -  Net 925.2 ml   Filed Weights   06/25/20 1732 06/25/20 2257  Weight: 38.6 kg 38.6 kg    Examination:  General exam: Well developed lady, with  left periorbital ecchymosis.  Respiratory system: clear to auscultation, no wheezing or rhonchi.  Cardiovascular system: S1S2 heard, tachycardic, no JVD. No pedal edema. Gastrointestinal system: Abdomen is soft, non tender non distended, bowel sound okay.  Central nervous system: Alert and oriented, non focal.  Extremities: no pedal edema. Skin: peri orbital ecchymosis. Psychiatry:  Mood is appropriate. No agitation.     Data Reviewed: I have personally reviewed following labs and imaging studies  CBC: Recent Labs  Lab 06/25/20 1737 06/26/20 0313  WBC 6.6 5.4  HGB 12.9 11.1*  HCT 36.7 32.2*  MCV 102.8* 105.9*  PLT 243 [id number]    Basic Metabolic Panel: Recent Labs  Lab 06/25/20 1737 06/25/20 2134 06/26/20 0313 06/27/20 1035  NA 135  --  138 135  K 3.0*  --  3.2* 3.7  CL 93*  --  104 98  CO2 24  --  24 24  GLUCOSE 111*  --  96 96  BUN 32*  --  26* 15  CREATININE 1.33*  --  1.11* 0.87  CALCIUM 9.4  --  8.3* 9.1  MG  --  1.5* 2.1  --   PHOS  --   --  2.7  --     GFR: Estimated Creatinine Clearance: 34.6 mL/min (by C-G formula based on SCr of 0.87 mg/dL).  Liver Function Tests: Recent Labs  Lab 06/26/20 0313  AST 43*  ALT 21  ALKPHOS 56  BILITOT 0.7  PROT 5.8*  ALBUMIN 3.3*    CBG: No results for input(s): GLUCAP in the last 168 hours.   No results found for this or any previous visit (from the past 240 hour(s)).       Radiology Studies: CT Head Wo Contrast  Result Date: 06/25/2020 CLINICAL DATA:  Polytrauma, critical, head/C-spine injury suspected; Facial trauma Polytrauma, critical, head/C-spine injury suspected Frequent falls. EXAM: CT HEAD WITHOUT CONTRAST CT CERVICAL SPINE WITHOUT CONTRAST TECHNIQUE: Multidetector CT imaging of the head and cervical spine was performed following the standard protocol without intravenous contrast. Multiplanar CT image reconstructions of the cervical spine were also generated. COMPARISON:  Head CT and brain MRI  06/18/2020, 1 week prior. FINDINGS: CT HEAD FINDINGS Brain: Stable degree of atrophy and chronic small vessel ischemia, advanced for age. No intracranial hemorrhage, mass effect, or midline shift. No hydrocephalus. The basilar cisterns are patent. 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 No evidence of territorial infarct or acute ischemia. No extra-axial or intracranial fluid collection. Vascular: Atherosclerosis of skullbase vasculature without hyperdense vessel or abnormal calcification. Skull: No fracture or focal lesion. Sinuses/Orbits: Paranasal sinuses and mastoid air cells are clear. The visualized orbits are unremarkable. Other: Left parietooccipital scalp hematoma. CT CERVICAL SPINE FINDINGS Alignment: Straightening of normal lordosis. Broad-based rightward scoliotic curvature versus positioning. Questionable widening of right C5-C6  facet, unchanged from recent exam. Skull base and vertebrae: No acute fracture. Vertebral body heights are maintained. The dens and skull base are intact. Mild degenerative pannus at C1-C2. Soft tissues and spinal canal: No prevertebral fluid or swelling. No visible canal hematoma. Disc levels: Disc space narrowing and endplate spurring at every level, most prominent at C5-C6 and C6-C7. There is multilevel facet hypertrophy. No high-grade canal stenosis. Upper chest: Minimal emphysema. Other: None. IMPRESSION: 1. Left parietooccipital scalp hematoma. No acute intracranial abnormality. No skull fracture. 2. 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 3. Stable atrophy and chronic small vessel ischemia, advanced for age. 4. Multilevel degenerative change throughout the cervical spine without acute fracture or subluxation. Electronically Signed   By: Keith Rake M.D.   On: 06/25/2020 22:24   CT Cervical Spine Wo Contrast  Result Date: 06/25/2020 CLINICAL DATA:  Polytrauma, critical,  head/C-spine injury suspected; Facial trauma Polytrauma, critical, head/C-spine injury suspected Frequent falls. EXAM: CT HEAD WITHOUT CONTRAST CT CERVICAL SPINE WITHOUT CONTRAST TECHNIQUE: Multidetector CT imaging of the head and cervical spine was performed following the standard protocol without intravenous contrast. Multiplanar CT image reconstructions of the cervical spine were also generated. COMPARISON:  Head CT and brain MRI 06/18/2020, 1 week prior. FINDINGS: CT HEAD FINDINGS Brain: Stable degree of atrophy and chronic small vessel ischemia, advanced for age. No intracranial hemorrhage, mass effect, or midline shift. No hydrocephalus. The basilar cisterns are patent. 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 No evidence of territorial infarct or acute ischemia. No extra-axial or intracranial fluid collection. Vascular: Atherosclerosis of skullbase vasculature without hyperdense vessel or abnormal calcification. Skull: No fracture or focal lesion. Sinuses/Orbits: Paranasal sinuses and mastoid air cells are clear. The visualized orbits are unremarkable. Other: Left parietooccipital scalp hematoma. CT CERVICAL SPINE FINDINGS Alignment: Straightening of normal lordosis. Broad-based rightward scoliotic curvature versus positioning. Questionable widening of right C5-C6 facet, unchanged from recent exam. Skull base and vertebrae: No acute fracture. Vertebral body heights are maintained. The dens and skull base are intact. Mild degenerative pannus at C1-C2. Soft tissues and spinal canal: No prevertebral fluid or swelling. No visible canal hematoma. Disc levels: Disc space narrowing and endplate spurring at every level, most prominent at C5-C6 and C6-C7. There is multilevel facet hypertrophy. No high-grade canal stenosis. Upper chest: Minimal emphysema. Other: None. IMPRESSION: 1. Left parietooccipital scalp hematoma. No acute intracranial abnormality. No skull fracture. 2. No CT  correlate for the right corona radiata subacute to chronic infarct on recent MRI. 3. Stable atrophy and chronic small vessel ischemia, advanced for age. 4. Multilevel degenerative change throughout the cervical spine without acute fracture or subluxation. Electronically Signed   By: Keith Rake M.D.   On: 06/25/2020 22:24        Scheduled Meds: . amLODipine  10 mg Oral Daily  . buPROPion  150 mg Oral QHS  . escitalopram  20 mg Oral Daily  . folic acid  1 mg Oral Daily  . irbesartan  300 mg Oral Daily  . LORazepam  0-4 mg Oral Q6H   Followed by  . [START ON 06/28/2020] LORazepam  0-4 mg Oral Q12H  . mometasone-formoterol  2 puff Inhalation BID  . multivitamin with minerals  1 tablet Oral Daily  . nicotine  21 mg Transdermal Daily  . rosuvastatin  10 mg Oral QHS  . thiamine  100 mg Oral Daily   Or  . thiamine  100 mg Intravenous Daily   Continuous Infusions:    LOS: 1 day        Hosie Poisson, MD [facility]   To contact the attending provider between 7A-7P or the covering provider during after hours 7P-7A, please log into the web site [email or web address] and access using universal [place] password for that web site. If you do not have the password, please call the hospital operator.  06/27/2020, 12:54 PM

## 2020-06-27 NOTE — ED Notes (Signed)
ED TO INPATIENT HANDOFF REPORT  Name/Age/Gender Crystal Noble 75 y.o. female  Code Status    Code Status Orders  (From admission, onward)         Start     Ordered   06/26/20 0114  Full code  Continuous        06/26/20 0115        Code Status History    Date Active Date Inactive Code Status Order ID Comments User Context   06/25/2020 2253 06/26/2020 0115 Full Code [id number]  Carmin Muskrat, MD ED   Advance Care Planning Activity    Advance Directive Documentation   Flowsheet Row Most Recent Value  Type of Advance Directive Healthcare Power of Attorney, Living will  Pre-existing out of facility DNR order (yellow form or pink MOST form) --  "MOST" Form in Place? --      Home/SNF/Other Home  Chief Complaint AKI (acute kidney injury) ([place]) [N17.9]  Level of Care/Admitting Diagnosis ED Disposition    ED Disposition Condition [place]: [facility] [100102]  Level of Care: Telemetry [5]  Admit to tele based on following criteria: Monitor QTC interval  May admit patient to [facility] or [facility] if equivalent level of care is available:: No  Covid Evaluation: Asymptomatic Screening Protocol (No Symptoms)  Diagnosis: AKI (acute kidney injury) [facility]) [[id number]]  Admitting Physician: Hosie Poisson [[id number]]  Attending Physician: Hosie Poisson [[id number]]  Estimated length of stay: past midnight tomorrow  Certification:: I certify this patient will need inpatient services for at least 2 midnights       Medical History Past Medical History:  Diagnosis Date  . Carotid stenosis, asymptomatic   . Colitis, collagenous   . Hypertension     Allergies Allergies  Allergen Reactions  . Penicillin G Rash    IV Location/Drains/Wounds Patient Lines/Drains/Airways Status    Active Line/Drains/Airways    Name Placement date Placement time Site Days   Peripheral IV 06/18/20 Left Antecubital 06/18/20  1130  Antecubital  9    Peripheral IV 06/25/20 Right Forearm 06/25/20  2143  Forearm  2   External Urinary Catheter 06/25/20  2057  --  2          Labs/Imaging Results for orders placed or performed during the hospital encounter of 06/25/20 (from the past 48 hour(s))  Basic metabolic panel     Status: Abnormal   Collection Time: 06/25/20  5:37 PM  Result Value Ref Range   Sodium 135 135 - 145 mmol/L   Potassium 3.0 (L) 3.5 - 5.1 mmol/L   Chloride 93 (L) 98 - 111 mmol/L   CO2 24 22 - 32 mmol/L   Glucose, Bld 111 (H) 70 - 99 mg/dL    Comment: Glucose reference range applies only to samples taken after fasting for at least 8 hours.   BUN 32 (H) 8 - 23 mg/dL   Creatinine, Ser 1.33 (H) 0.44 - 1.00 mg/dL   Calcium 9.4 8.9 - 10.3 mg/dL   GFR, Estimated 42 (L) >60 mL/min    Comment: (NOTE) Calculated using the CKD-EPI Creatinine Equation (2021)    Anion gap 18 (H) 5 - 15    Comment: Performed at [facility], [place] [address], [place], [place] [address]  CBC     Status: Abnormal   Collection Time: 06/25/20  5:37 PM  Result Value Ref Range   WBC 6.6 4.0 - 10.5 K/uL   RBC 3.57 (L)  3.87 - 5.11 MIL/uL   Hemoglobin 12.9 12.0 - 15.0 g/dL   HCT 36.7 36.0 - 46.0 %   MCV 102.8 (H) 80.0 - 100.0 fL   MCH 36.1 (H) 26.0 - 34.0 pg   MCHC 35.1 30.0 - 36.0 g/dL   RDW 12.5 11.5 - 15.5 %   Platelets 243 150 - 400 K/uL   nRBC 0.0 0.0 - 0.2 %    Comment: Performed at [facility], [place] [address], [place], [place] [address]  Ethanol     Status: None   Collection Time: 06/25/20  9:34 PM  Result Value Ref Range   Alcohol, Ethyl (B) <10 <10 mg/dL    Comment: (NOTE) Lowest detectable limit for serum alcohol is 10 mg/dL.  For medical purposes only. Performed at [facility], [place] [address], [place], [place] [address]   Magnesium     Status: Abnormal   Collection Time: 06/25/20  9:34 PM  Result Value Ref Range   Magnesium 1.5 (L) 1.7 - 2.4 mg/dL    Comment:  Performed at [facility], [place] [address], [place], [place] [address]  Urinalysis, Routine w reflex microscopic     Status: Abnormal   Collection Time: 06/25/20 10:37 PM  Result Value Ref Range   Color, Urine YELLOW YELLOW   APPearance HAZY (A) CLEAR   Specific Gravity, Urine 1.021 1.005 - 1.030   pH 5.0 5.0 - 8.0   Glucose, UA NEGATIVE NEGATIVE mg/dL   Hgb urine dipstick NEGATIVE NEGATIVE   Bilirubin Urine NEGATIVE NEGATIVE   Ketones, ur 5 (A) NEGATIVE mg/dL   Protein, ur 30 (A) NEGATIVE mg/dL   Nitrite NEGATIVE NEGATIVE   Leukocytes,Ua TRACE (A) NEGATIVE   RBC / HPF 0-5 0 - 5 RBC/hpf   WBC, UA 6-10 0 - 5 WBC/hpf   Bacteria, UA NONE SEEN NONE SEEN   Squamous Epithelial / LPF 0-5 0 - 5   Hyaline Casts, UA PRESENT     Comment: Performed at [facility], [place] [address] [place], [place] [address]  POC SARS Coronavirus 2 Ag-ED - Nasal Swab (BD Veritor Kit)     Status: None   Collection Time: 06/25/20 10:53 PM  Result Value Ref Range   SARS Coronavirus 2 Ag NEGATIVE NEGATIVE    Comment: (NOTE) SARS-CoV-2 antigen NOT DETECTED.   Negative results are presumptive.  Negative results do not preclude SARS-CoV-2 infection and should not be used as the sole basis for treatment or other patient management decisions, including infection  control decisions, particularly in the presence of clinical signs and  symptoms consistent with COVID-19, or in those who have been in contact with the virus.  Negative results must be combined with clinical observations, patient history, and epidemiological information. The expected result is Negative.  Fact Sheet for Patients: HandmadeRecipes.com.cy  Fact Sheet for Healthcare Providers: FuneralLife.at  This test is not yet approved or cleared by the [place] FDA and  has been authorized for detection and/or diagnosis of SARS-CoV-2 by FDA under an Emergency Use  Authorization (EUA).  This EUA will remain in effect (meaning this test can be used) for the duration of  the COV ID-19 declaration under Section 564(b)(1) of the Act, 21 U.S.C. section 360bbb-3(b)(1), unless the authorization is terminated or revoked sooner.    Comprehensive metabolic panel     Status: Abnormal   Collection Time: 06/26/20  3:13 AM  Result Value Ref Range   Sodium 138 135 -  145 mmol/L   Potassium 3.2 (L) 3.5 - 5.1 mmol/L   Chloride 104 98 - 111 mmol/L   CO2 24 22 - 32 mmol/L   Glucose, Bld 96 70 - 99 mg/dL    Comment: Glucose reference range applies only to samples taken after fasting for at least 8 hours.   BUN 26 (H) 8 - 23 mg/dL   Creatinine, Ser 1.11 (H) 0.44 - 1.00 mg/dL   Calcium 8.3 (L) 8.9 - 10.3 mg/dL   Total Protein 5.8 (L) 6.5 - 8.1 g/dL   Albumin 3.3 (L) 3.5 - 5.0 g/dL   AST 43 (H) 15 - 41 U/L   ALT 21 0 - 44 U/L   Alkaline Phosphatase 56 38 - 126 U/L   Total Bilirubin 0.7 0.3 - 1.2 mg/dL   GFR, Estimated 52 (L) >60 mL/min    Comment: (NOTE) Calculated using the CKD-EPI Creatinine Equation (2021)    Anion gap 10 5 - 15    Comment: Performed at [facility], [place] [address], [place], [place] [address]  Magnesium     Status: None   Collection Time: 06/26/20  3:13 AM  Result Value Ref Range   Magnesium 2.1 1.7 - 2.4 mg/dL    Comment: Performed at [facility], [place] [address], [place], [place] [address]  Phosphorus     Status: None   Collection Time: 06/26/20  3:13 AM  Result Value Ref Range   Phosphorus 2.7 2.5 - 4.6 mg/dL    Comment: Performed at [facility], [place] [address] [place], [place] [address]  CBC     Status: Abnormal   Collection Time: 06/26/20  3:13 AM  Result Value Ref Range   WBC 5.4 4.0 - 10.5 K/uL   RBC 3.04 (L) 3.87 - 5.11 MIL/uL   Hemoglobin 11.1 (L) 12.0 - 15.0 g/dL   HCT 32.2 (L) 36.0 - 46.0 %   MCV 105.9 (H) 80.0 - 100.0 fL   MCH 36.5 (H) 26.0 - 34.0 pg   MCHC 34.5  30.0 - 36.0 g/dL   RDW 12.8 11.5 - 15.5 %   Platelets 192 150 - 400 K/uL   nRBC 0.0 0.0 - 0.2 %    Comment: Performed at [facility], [place] [address] [place], [place] [address]  Basic metabolic panel     Status: None   Collection Time: 06/27/20 10:35 AM  Result Value Ref Range   Sodium 135 135 - 145 mmol/L   Potassium 3.7 3.5 - 5.1 mmol/L   Chloride 98 98 - 111 mmol/L   CO2 24 22 - 32 mmol/L   Glucose, Bld 96 70 - 99 mg/dL    Comment: Glucose reference range applies only to samples taken after fasting for at least 8 hours.   BUN 15 8 - 23 mg/dL   Creatinine, Ser 0.87 0.44 - 1.00 mg/dL   Calcium 9.1 8.9 - 10.3 mg/dL   GFR, Estimated >60 >60 mL/min    Comment: (NOTE) Calculated using the CKD-EPI Creatinine Equation (2021)    Anion gap 13 5 - 15    Comment: Performed at [facility], [place] [address], [place],  [address]   CT Head Wo Contrast  Result Date: 06/25/2020 CLINICAL DATA:  Polytrauma, critical, head/C-spine injury suspected; Facial trauma Polytrauma, critical, head/C-spine injury suspected Frequent falls. EXAM: CT HEAD WITHOUT CONTRAST CT CERVICAL SPINE WITHOUT CONTRAST TECHNIQUE: Multidetector CT imaging of the head and cervical spine was performed  following the standard protocol without intravenous contrast. Multiplanar CT image reconstructions of the cervical spine were also generated. COMPARISON:  Head CT and brain MRI 06/18/2020, 1 week prior. FINDINGS: CT HEAD FINDINGS Brain: Stable degree of atrophy and chronic small vessel ischemia, advanced for age. No intracranial hemorrhage, mass effect, or midline shift. No hydrocephalus. The basilar cisterns are patent. 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 No evidence of territorial infarct or acute ischemia. No extra-axial or intracranial fluid collection. Vascular: Atherosclerosis of skullbase vasculature without hyperdense vessel or abnormal  calcification. Skull: No fracture or focal lesion. Sinuses/Orbits: Paranasal sinuses and mastoid air cells are clear. The visualized orbits are unremarkable. Other: Left parietooccipital scalp hematoma. CT CERVICAL SPINE FINDINGS Alignment: Straightening of normal lordosis. Broad-based rightward scoliotic curvature versus positioning. Questionable widening of right C5-C6 facet, unchanged from recent exam. Skull base and vertebrae: No acute fracture. Vertebral body heights are maintained. The dens and skull base are intact. Mild degenerative pannus at C1-C2. Soft tissues and spinal canal: No prevertebral fluid or swelling. No visible canal hematoma. Disc levels: Disc space narrowing and endplate spurring at every level, most prominent at C5-C6 and C6-C7. There is multilevel facet hypertrophy. No high-grade canal stenosis. Upper chest: Minimal emphysema. Other: None. IMPRESSION: 1. Left parietooccipital scalp hematoma. No acute intracranial abnormality. No skull fracture. 2. 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 3. Stable atrophy and chronic small vessel ischemia, advanced for age. 4. Multilevel degenerative change throughout the cervical spine without acute fracture or subluxation. Electronically Signed   By: Keith Rake M.D.   On: 06/25/2020 22:24   CT Cervical Spine Wo Contrast  Result Date: 06/25/2020 CLINICAL DATA:  Polytrauma, critical, head/C-spine injury suspected; Facial trauma Polytrauma, critical, head/C-spine injury suspected Frequent falls. EXAM: CT HEAD WITHOUT CONTRAST CT CERVICAL SPINE WITHOUT CONTRAST TECHNIQUE: Multidetector CT imaging of the head and cervical spine was performed following the standard protocol without intravenous contrast. Multiplanar CT image reconstructions of the cervical spine were also generated. COMPARISON:  Head CT and brain MRI 06/18/2020, 1 week prior. FINDINGS: CT HEAD FINDINGS Brain: Stable degree of atrophy and  chronic small vessel ischemia, advanced for age. No intracranial hemorrhage, mass effect, or midline shift. No hydrocephalus. The basilar cisterns are patent. 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 No evidence of territorial infarct or acute ischemia. No extra-axial or intracranial fluid collection. Vascular: Atherosclerosis of skullbase vasculature without hyperdense vessel or abnormal calcification. Skull: No fracture or focal lesion. Sinuses/Orbits: Paranasal sinuses and mastoid air cells are clear. The visualized orbits are unremarkable. Other: Left parietooccipital scalp hematoma. CT CERVICAL SPINE FINDINGS Alignment: Straightening of normal lordosis. Broad-based rightward scoliotic curvature versus positioning. Questionable widening of right C5-C6 facet, unchanged from recent exam. Skull base and vertebrae: No acute fracture. Vertebral body heights are maintained. The dens and skull base are intact. Mild degenerative pannus at C1-C2. Soft tissues and spinal canal: No prevertebral fluid or swelling. No visible canal hematoma. Disc levels: Disc space narrowing and endplate spurring at every level, most prominent at C5-C6 and C6-C7. There is multilevel facet hypertrophy. No high-grade canal stenosis. Upper chest: Minimal emphysema. Other: None. IMPRESSION: 1. Left parietooccipital scalp hematoma. No acute intracranial abnormality. No skull fracture. 2. 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 3. Stable atrophy and chronic small vessel ischemia, advanced for age. 4. Multilevel degenerative  change throughout the cervical spine without acute fracture or subluxation. Electronically Signed   By: Keith Rake M.D.   On: 06/25/2020 22:24    Pending Labs Unresulted Labs (From admission, onward)         None      Vitals/Pain Today's Vitals   06/27/20 0906 06/27/20 0911 06/27/20 1230 06/27/20 1252  BP: (!) 185/95 (!)  185/95 (!) 171/82 (!) 171/82  Pulse: 68 77 95 95  Resp:  16 18   Temp:      TempSrc:      SpO2:  96% 96%   Weight:      Height:      PainSc:        Isolation Precautions No active isolations  Medications Medications  rosuvastatin (CRESTOR) tablet 10 mg (10 mg Oral Given 06/26/20 2126)  buPROPion (WELLBUTRIN XL) 24 hr tablet 150 mg (150 mg Oral Given 06/26/20 2126)  escitalopram (LEXAPRO) tablet 20 mg (20 mg Oral Given 06/27/20 1106)  mometasone-formoterol (DULERA) 200-5 MCG/ACT inhaler 2 puff (2 puffs Inhalation Given 06/27/20 0906)  LORazepam (ATIVAN) tablet 1-4 mg (has no administration in time range)    Or  LORazepam (ATIVAN) injection 1-4 mg (has no administration in time range)  thiamine tablet 100 mg (100 mg Oral Given 06/27/20 1107)    Or  thiamine (B-1) injection 100 mg ( Intravenous See Alternative 1/95/97 4718)  folic acid (FOLVITE) tablet 1 mg (1 mg Oral Given 06/27/20 1106)  multivitamin with minerals tablet 1 tablet (1 tablet Oral Given 06/27/20 1105)  0.9 %  sodium chloride infusion ( Intravenous Stopped 06/26/20 1427)  LORazepam (ATIVAN) tablet 0-4 mg (0 mg Oral Not Given 06/27/20 1252)    Followed by  LORazepam (ATIVAN) tablet 0-4 mg (has no administration in time range)  acetaminophen (TYLENOL) tablet 650 mg (has no administration in time range)    Or  acetaminophen (TYLENOL) suppository 650 mg (has no administration in time range)  senna-docusate (Senokot-S) tablet 1 tablet (has no administration in time range)  ondansetron (ZOFRAN) tablet 4 mg (has no administration in time range)    Or  ondansetron (ZOFRAN) injection 4 mg (has no administration in time range)  labetalol (NORMODYNE) injection 10 mg (10 mg Intravenous Given 06/27/20 0512)  irbesartan (AVAPRO) tablet 300 mg (300 mg Oral Given 06/27/20 1105)  amLODipine (NORVASC) tablet 10 mg (10 mg Oral Given 06/27/20 1105)  nicotine (NICODERM CQ - dosed in mg/24 hours) patch 21 mg (21 mg Transdermal Patch Applied  06/27/20 1408)  aspirin tablet 325 mg (has no administration in time range)  sodium chloride 0.9 % bolus 1,000 mL (0 mLs Intravenous Stopped 5/50/15 8682)  folic acid (FOLVITE) tablet 1 mg (1 mg Oral Given 06/25/20 2145)  thiamine tablet 100 mg (100 mg Oral Given 06/25/20 2145)  0.9 % NaCl with KCl 20 mEq/ L  infusion ( Intravenous Stopped 06/26/20 0130)  magnesium sulfate IVPB 2 g 50 mL (0 g Intravenous Stopped 06/26/20 0130)  potassium chloride SA (KLOR-CON) CR tablet 20 mEq (20 mEq Oral Given 06/26/20 0211)  potassium chloride SA (KLOR-CON) CR tablet 20 mEq (20 mEq Oral Given 06/26/20 1427)    Mobility walks with device

## 2020-06-28 DIAGNOSIS — J449 Chronic obstructive pulmonary disease, unspecified: Secondary | ICD-10-CM | POA: Diagnosis not present

## 2020-06-28 DIAGNOSIS — F419 Anxiety disorder, unspecified: Secondary | ICD-10-CM | POA: Diagnosis not present

## 2020-06-28 DIAGNOSIS — N179 Acute kidney failure, unspecified: Secondary | ICD-10-CM | POA: Diagnosis not present

## 2020-06-28 DIAGNOSIS — F1010 Alcohol abuse, uncomplicated: Secondary | ICD-10-CM | POA: Diagnosis not present

## 2020-06-28 MED ORDER — ARIPIPRAZOLE 2 MG PO TABS
5.0000 mg | ORAL_TABLET | Freq: Every day | ORAL | Status: DC
  Administered 2020-06-29 – 2020-06-30 (×2): 5 mg via ORAL
  Filled 2020-06-28 (×2): qty 3

## 2020-06-28 MED ORDER — ENSURE ENLIVE PO LIQD
237.0000 mL | Freq: Three times a day (TID) | ORAL | Status: DC
  Administered 2020-06-28 – 2020-06-29 (×2): 237 mL via ORAL

## 2020-06-28 MED ORDER — ARIPIPRAZOLE 2 MG PO TABS
2.0000 mg | ORAL_TABLET | Freq: Once | ORAL | Status: AC
  Administered 2020-06-28: 2 mg via ORAL
  Filled 2020-06-28: qty 1

## 2020-06-28 NOTE — Consult Note (Signed)
Telepsych Consultation   Reason for Consult:  Depression, alcohol abuse, multiple falls from alcohol abuse and patient still driving, is a danger to herself and other. Request medications for depression.  Referring Physician:  Dr. Karleen Hampshire Location of Patient: [name] Location of Provider: [facility]  Patient Identification: Crystal Noble MRN:  [id number] Principal Diagnosis: AKI (acute kidney injury) [facility]) Diagnosis:  Principal Problem:   AKI (acute kidney injury) ([place]) Active Problems:   Coronary artery disease involving native coronary artery of native heart without angina pectoris   Essential hypertension   Hypokalemia   Hypomagnesemia   Alcohol abuse   Anxiety disorder   COPD (chronic obstructive pulmonary disease) ([place])   Falls   Total Time spent with patient: 30 minutes  Subjective:   Crystal Noble is a 75 y.o. female patient admitted with alcohol use disorder, falls and confusion.Patient reports a history of depression and anxiety that is being managed by her outpatient provider. She states her depression is fair "not getting worse or better" She states her depression has not impacted her alcohol intake. She reports she drinks one cup of brandy a day. When assessing for more information about the quantity she states "a cup is 8 oz. So I drink 8 ounces of brandy a day. " She reports that she drinks at home and does not feel like she is a danger to herself. She reports she does not drink and drive, and has never endangered other people. She denies any other substance abuse or legal charges at this time. She reports compliance with her medication of lexapro and Wellbutrin, and denies any side effects at this time. She denies any history of current or recent suicidal thoughts, or ideations. She denies any past suicide attempts. She answers all questions appropriately and does not appear to be responding to internal stimuli.    Patient is alert and oriented at  this time, she is calm and cooperative. She is observed to be lying in the bed and responsive to this nurse practitioner. SHe appears to be interested and motivated to seeking treatment for substance abuse, and prefers outpatient treatment. " I preferably dont like people or groups, so I will do better at home. " She denies any previous withdraw symptoms or DTs, when discontinuing alcohol use. She denies any risky behaviors that would endanger herself or others when she is at home. She admits to falling and expresses interests in seeking help. SHe denies suicidal ideations, homicidal ideations and or hallucinations at this time. She will benefit from inpatient substance abuse or outpatient substance abuse, suspect there are barriers to completing outpatient therapy due to her age and limited technology.   HPI:  Crystal Noble is a 75 y.o. female with medical history significant for hypertension, CAD, PAD, asymptomatic carotid artery stenosis, and alcohol abuse, presented to the emergency department, brought in by a friend for evaluation of recurrent falls, alcohol abuse, and poor nutrition.  Patient was seen in the emergency department after a fall about a week ago, had extensive work-up including CTA head and neck and MRI brain, was treated for UTI though she denies any recent fevers, dysuria, or flank pain, and returned home.  Since returning home, she has had another fall, does not remember exactly how it occurred but denies losing consciousness and reports that she had had approximately 8 ounces of liquor prior to the fall.  A friend who accompanied her to the ED raise concern that the patient drinks alcohol throughout  the day and does not eat or drink much else.  Patient denies feeling particularly depressed or having any intention of harming herself.  She denies any history of alcohol withdrawal and denies any hallucinations.  Past Psychiatric History: Depression and anxiety. Currently receiving  outpatient services with Rache[facility] Cipro. She is prescribe Lexapro 20mg  po daily and wellbutrin xl 150mg  po daily for anxiety.   Risk to Self:   Risk to Others:   Prior Inpatient Therapy:   Prior Outpatient Therapy:    Past Medical History:  Past Medical History:  Diagnosis Date  . Carotid stenosis, asymptomatic   . Colitis, collagenous   . Hypertension     Past Surgical History:  Procedure Laterality Date  . APPENDECTOMY     Family History:  Family History  Problem Relation Age of Onset  . Hypertension Mother   . Hyperlipidemia Mother   . Heart attack Mother   . Depression Mother        Bi-Polar  . Cancer Father        Lung   Family Psychiatric  History: Denies Social History:  Social History   Substance and Sexual Activity  Alcohol Use Yes  . Alcohol/week: 21.0 standard drinks  . Types: 21 Glasses of wine per week     Social History   Substance and Sexual Activity  Drug Use No    Social History   Socioeconomic History  . Marital status: Widowed    Spouse name: Not on file  . Number of children: Not on file  . Years of education: Not on file  . Highest education level: Not on file  Occupational History  . Not on file  Tobacco Use  . Smoking status: Current Every Day Smoker    Packs/day: 0.50    Years: 30.00    Pack years: 15.00    Types: Cigarettes  . Smokeless tobacco: Never Used  Vaping Use  . Vaping Use: Never used  Substance and Sexual Activity  . Alcohol use: Yes    Alcohol/week: 21.0 standard drinks    Types: 21 Glasses of wine per week  . Drug use: No  . Sexual activity: Not on file  Other Topics Concern  . Not on file  Social History Narrative  . Not on file   Social Determinants of Health   Financial Resource Strain: Not on file  Food Insecurity: Not on file  Transportation Needs: Not on file  Physical Activity: Not on file  Stress: Not on file  Social Connections: Not on file   Additional Social History:    Allergies:    Allergies  Allergen Reactions  . Penicillin G Rash    Labs:  Results for orders placed or performed during the hospital encounter of 06/25/20 (from the past 48 hour(s))  Basic metabolic panel     Status: None   Collection Time: 06/27/20 10:35 AM  Result Value Ref Range   Sodium 135 135 - 145 mmol/L   Potassium 3.7 3.5 - 5.1 mmol/L   Chloride 98 98 - 111 mmol/L   CO2 24 22 - 32 mmol/L   Glucose, Bld 96 70 - 99 mg/dL    Comment: Glucose reference range applies only to samples taken after fasting for at least 8 hours.   BUN 15 8 - 23 mg/dL   Creatinine, Ser 0.87 0.44 - 1.00 mg/dL   Calcium 9.1 8.9 - 10.3 mg/dL   GFR, Estimated >60 >60 mL/min    Comment: (NOTE) Calculated using the  CKD-EPI Creatinine Equation (2021)    Anion gap 13 5 - 15    Comment: Performed at [facility], [place] [address] [place], [place] [address]    Medications:  Current Facility-Administered Medications  Medication Dose Route Frequency Provider Last Rate Last Admin  . acetaminophen (TYLENOL) tablet 650 mg  650 mg Oral Q6H PRN Opyd, Ilene Qua, MD       Or  . acetaminophen (TYLENOL) suppository 650 mg  650 mg Rectal Q6H PRN Opyd, Ilene Qua, MD      . amLODipine (NORVASC) tablet 10 mg  10 mg Oral Daily Hosie Poisson, MD   10 mg at 06/28/20 1007  . aspirin tablet 325 mg  325 mg Oral Daily Hosie Poisson, MD   325 mg at 06/28/20 1006  . buPROPion (WELLBUTRIN XL) 24 hr tablet 150 mg  150 mg Oral QHS Opyd, Ilene Qua, MD   150 mg at 06/27/20 2100  . escitalopram (LEXAPRO) tablet 20 mg  20 mg Oral Daily Opyd, Ilene Qua, MD   20 mg at 06/28/20 1006  . folic acid (FOLVITE) tablet 1 mg  1 mg Oral Daily Opyd, Ilene Qua, MD   1 mg at 06/28/20 1007  . irbesartan (AVAPRO) tablet 300 mg  300 mg Oral Daily Hosie Poisson, MD   300 mg at 06/28/20 1006  . labetalol (NORMODYNE) injection 10 mg  10 mg Intravenous Q2H PRN Opyd, Ilene Qua, MD   10 mg at 06/27/20 0512  . LORazepam (ATIVAN) tablet 1-4 mg  1-4 mg  Oral Q1H PRN Opyd, Ilene Qua, MD       Or  . LORazepam (ATIVAN) injection 1-4 mg  1-4 mg Intravenous Q1H PRN Opyd, Ilene Qua, MD      . LORazepam (ATIVAN) tablet 0-4 mg  0-4 mg Oral Q12H Opyd, Ilene Qua, MD      . mometasone-formoterol (DULERA) 200-5 MCG/ACT inhaler 2 puff  2 puff Inhalation BID Opyd, Ilene Qua, MD   2 puff at 06/28/20 0819  . multivitamin with minerals tablet 1 tablet  1 tablet Oral Daily Opyd, Ilene Qua, MD   1 tablet at 06/28/20 1006  . nicotine (NICODERM CQ - dosed in mg/24 hours) patch 21 mg  21 mg Transdermal Daily Hosie Poisson, MD   21 mg at 06/28/20 1007  . ondansetron (ZOFRAN) tablet 4 mg  4 mg Oral Q6H PRN Opyd, Ilene Qua, MD       Or  . ondansetron (ZOFRAN) injection 4 mg  4 mg Intravenous Q6H PRN Opyd, Ilene Qua, MD      . rosuvastatin (CRESTOR) tablet 10 mg  10 mg Oral QHS Opyd, Ilene Qua, MD   10 mg at 06/27/20 2100  . senna-docusate (Senokot-S) tablet 1 tablet  1 tablet Oral QHS PRN Opyd, Ilene Qua, MD      . thiamine tablet 100 mg  100 mg Oral Daily Opyd, Ilene Qua, MD   100 mg at 06/28/20 1007   Or  . thiamine (B-1) injection 100 mg  100 mg Intravenous Daily Opyd, Ilene Qua, MD        Musculoskeletal: Strength & Muscle Tone: within normal limits Gait & Station: unsteady Patient leans: N/A  Psychiatric Specialty Exam: Physical Exam  Review of Systems  Blood pressure (!) 144/67, pulse 78, temperature 97.8 F (36.6 C), resp. rate 16, height 5' (1.524 m), weight 31.6 kg, SpO2 94 %.Body mass index is 13.61 kg/m.  General Appearance: Disheveled and periborital edema and erythema to  Left eye  Eye Contact:  Fair  Speech:  Clear and Coherent and Slow  Volume:  Normal  Mood:  fine  Affect:  Depressed and Flat  Thought Process:  Coherent, Linear and Descriptions of Associations: Intact  Orientation:  Full (Time, Place, and Person)  Thought Content:  Logical  Suicidal Thoughts:  No  Homicidal Thoughts:  No  Memory:  Immediate;   Fair Recent;   Fair   Judgement:  Intact  Insight:  Fair  Psychomotor Activity:  Normal  Concentration:  Concentration: Good and Attention Span: Good  Recall:  Good  Fund of Knowledge:  Good  Language:  Good  Akathisia:  No  Handed:  Right  AIMS (if indicated):     Assets:  Communication Skills Desire for Improvement Financial Resources/Insurance Housing Leisure Time Physical Health Social Support  ADL's:  Intact  Cognition:  WNL  Sleep:        Treatment Plan Summary: Plan Patient currently on antidepressnat medications prescribed by outpatient provider. Shw is taking two depression medications (Lexapro and Wellbutrin). Will DC Wellbutrutin at this time as patient is currently goingn thorugh alohol detox, and can potentially lower her seizure threshold. Will add Abilify 2mg  po once, then increase Abilify 5mg  po daily. At this time despite heavy consumption of daily alcohol use, patient denies any thoughts to harm herself or others. She also appears to have a decent amount of understanding, risks associated with her aily alcohol intake and insight to make medical appropriate decisions. There fore she has the capacity to make decisions at this time, and has declined inpatient.   -Continue CIWA -Continue alcohol detox protocol,  -Patient is at risk for devleoping delirium, continue to monitor for delirium.  - Recommend SW consult for substance abuse therapy.   Disposition: No evidence of imminent risk to self or others at present.   Patient does not meet criteria for psychiatric inpatient admission. Supportive therapy provided about ongoing stressors. Refer to IOP. Discussed crisis plan, support from social network, calling 911, coming to the Emergency Department, and calling Suicide Hotline. Recommend working with social work for inpatient substance abuse or chemical dependncy intensive outpatient program.   This service was provided via telemedicine using a 2-way, interactive audio and Airline pilot.  Names of all persons participating in this telemedicine service and their role in this encounter. Name: Sheran Fava Role: [place]  Name: Crystal Noble Role: Patient  Name: Hampton Abbot Role: Psychiatrist  Name:  Role:     Suella Broad, FNP 06/28/2020 10:24 AM

## 2020-06-28 NOTE — Progress Notes (Signed)
Initial Nutrition Assessment  DOCUMENTATION CODES:   Underweight  INTERVENTION:   -Ensure Enlive po TID, each supplement provides 350 kcal and 20 grams of protein -Continue MVI with minerals daily  NUTRITION DIAGNOSIS:   Inadequate oral intake related to decreased appetite as evidenced by per patient/family report,meal completion < 50%.  GOAL:   Patient will meet greater than or equal to 90% of their needs  MONITOR:   PO intake,Supplement acceptance,Labs,Skin,I & O's,Weight trends  REASON FOR ASSESSMENT:   Malnutrition Screening Tool    ASSESSMENT:   Crystal Noble is a 75 y.o. female with medical history significant for hypertension, CAD, PAD, asymptomatic carotid artery stenosis, and alcohol abuse, presented to the emergency department, brought in by a friend for evaluation of recurrent falls, alcohol abuse, and poor nutrition.  Patient was seen in the emergency department after a fall about a week ago, had extensive work-up including CTA head and neck and MRI brain, was treated for UTI though she denies any recent fevers, dysuria, or flank pain, and returned home.  Since returning home, she has had another fall, does not remember exactly how it occurred but denies losing consciousness and reports that she had had approximately 8 ounces of liquor prior to the fall.  A friend who accompanied her to the ED raise concern that the patient drinks alcohol throughout the day and does not eat or drink much else.  Patient denies feeling particularly depressed or having any intention of harming herself.  She denies any history of alcohol withdrawal and denies any hallucinations.  Pt admitted with AKI.  Reviewed I/O's: +240 ml x 24 hours and +3.2 L since admission  Pt unavailable at time of attempted contact.   Per H&P, there is concern that pt consumes very little food and drink except for alcohol (about 8 ounces of brandy daily). Pt with fair meal completions; noted PO 50%.    Reviewed wt hx; pt has experienced a 2.3% wt loss over the past 9 months, which is not significant for time frame. Noted pt with distant weight loss over the past several years.   Given underweight status and ETOH abuse, high suspect malnutrition, however, unable to identify at this time. Pt would greatly benefit from addition of oral nutrition supplements.   Medications reviewed and include folic acid, ativan, and thiamine.  Labs reviewed.   Diet Order:   Diet Order            Diet Heart Room service appropriate? Yes; Fluid consistency: Thin  Diet effective now                 EDUCATION NEEDS:   No education needs have been identified at this time  Skin:  Skin Assessment: Reviewed RN Assessment  Last BM:  06/25/20  Height:   Ht Readings from Last 1 Encounters:  06/27/20 5' (1.524 m)    Weight:   Wt Readings from Last 1 Encounters:  06/27/20 31.6 kg    Ideal Body Weight:  45.5 kg  BMI:  Body mass index is 13.61 kg/m.  Estimated Nutritional Needs:   Kcal:  1250-1450  Protein:  65-80 grams  Fluid:  > 1.2 L    Loistine Chance, RD, LDN, [place] Registered Dietitian II Certified Diabetes Care and Education Specialist Please refer to [facility] for RD and/or RD on-call/weekend/after hours pager

## 2020-06-28 NOTE — Progress Notes (Signed)
PROGRESS NOTE    Crystal Noble  GMW:[id number] DOB: [birth year] DOA: 06/25/2020 PCP: Fanny Bien, MD    Chief Complaint  Patient presents with  . Fatigue  . frequent falls  . UTI  . Failure To Thrive    Brief Narrative:   75 y.o. female with medical history significant for hypertension, CAD, PAD, asymptomatic carotid artery stenosis, and alcohol abuse, presented to the emergency department, brought in by a friend for evaluation of recurrent falls, alcohol abuse, and poor nutrition. :A friend who accompanied her to the ED raised concern that the patient drinks alcohol throughout the day and does not eat or drink much else.  Patient denies feeling particularly depressed or having any intention of harming herself.  She denies any history of alcohol withdrawal and denies any hallucinations. She was found to be hypokalemic, in AKI, . CT head and cervical spine showed Left parietooccipital scalp hematoma. No acute intracranial abnormality. No skull fracture. 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 Moderate chronic microvascular ischemic changes. She has periorbital ecchymosis from mulitple falls.  PT/OT EVAL ordered. Recommended home health PT on discharge .    Assessment & Plan  Principal Problem:   AKI (acute kidney injury) ([place]) Active Problems:   Coronary artery disease involving native coronary artery of native heart without angina pectoris   Essential hypertension   Hypokalemia   Hypomagnesemia   Alcohol abuse   Anxiety disorder   COPD (chronic obstructive pulmonary disease) ([place])   Falls   Alcohol abuse Patient reports drinking brandy every day. Denies any withdrawal symptoms at this time. Monitor patient on CIWA protocol. TOC consult for resources for alcohol abuse.     Multiple falls recently. Patient has periorbital ecchymosis. CT of the head shows scalp hematoma. Recent MRI shows subacute  infarct in the right corona radiator. PT OT evaluation ordered. Recommended home health PT, but pt politely refused.     Persistent hypokalemia and hypomagnesemia Replaced.  Repeat labs wnl.     AKI probably secondary to dehydration poor oral intake. Improving with IV fluids. Creatinine back to baseline.    Primary hypertension Hypertensive urgency.  BP parameters are better controlled today with norvasc and avapro.  Resume hydralazine prn.    Mild normocytic anemia Hemoglobin around 11.    COPD No signs of exacerbation at this time. Continue with bronchodilators as needed.    Depression and anxiety Continue with home medications at this time. Psychiatry consulted for depression, and alcohol abuse.  As per the friend who takes care of the patient, pt has been drinking alcohol every day, falling multiple times, refusing care, home health PT in the past. She will probably need capacity evaluation for disposition and medical care.    Small sub acute infarct evident earlier this month on the MRI brain.  She currently does not have any focal deficits. Started her on aspirin 325 mg daily, get lipid panel in am. Pt is on 10 mg of crestor, continue the same.  PT eval / OT  Re eval  Ordered.    DVT prophylaxis: (Lovenox) Code Status: (Full Code) Family Communication: none at bedside, discussed with friend over the phone. Friend requesting TOC to call her for disposition.  Disposition:   Status is: Inpatient.   The patient will require care spanning > 2 midnights and should be moved to inpatient because: Unsafe d/c plan and IV treatments appropriate due to intensity  of illness or inability to take PO, will need normalization of BP parameters prior to discharge.   Dispo: The patient is from: Home              Anticipated d/c is to: Home              Anticipated d/c date is: 1 day              Patient currently is not medically stable to d/c.       Consultants:    none   Procedures: None.   Antimicrobials: none.    Subjective: No headache or dizziness.  Objective: Vitals:   06/28/20 0108 06/28/20 0412 06/28/20 0820 06/28/20 1300  BP: (!) 157/78 (!) 144/67  (!) 148/73  Pulse: 77 78  90  Resp: 16 16  16   Temp: 98.1 F (36.7 C) 97.8 F (36.6 C)  97.9 F (36.6 C)  TempSrc:    Oral  SpO2: 99% 97% 94% 98%  Weight:      Height:        Intake/Output Summary (Last 24 hours) at 06/28/2020 1332 Last data filed at 06/28/2020 1300 Gross per 24 hour  Intake 480 ml  Output --  Net 480 ml   Filed Weights   06/25/20 1732 06/25/20 2257 06/27/20 1537  Weight: 38.6 kg 38.6 kg 31.6 kg    Examination:  General exam: elderly woman not in distress.  Respiratory system: air entry fair no wheezing or rhonchi.  Cardiovascular system: S1S2, RRR, no JVD, no pedal edema.  Gastrointestinal system: Abdomen is soft, NT ND BS+ Central nervous system: Alert and able to answer simple questions.  Extremities: No pedal edema.  Skin: peri orbital ecchymosis. Psychiatry:  Flat affect.     Data Reviewed: I have personally reviewed following labs and imaging studies  CBC: Recent Labs  Lab 06/25/20 1737 06/26/20 0313  WBC 6.6 5.4  HGB 12.9 11.1*  HCT 36.7 32.2*  MCV 102.8* 105.9*  PLT 243 [id number]    Basic Metabolic Panel: Recent Labs  Lab 06/25/20 1737 06/25/20 2134 06/26/20 0313 06/27/20 1035  NA 135  --  138 135  K 3.0*  --  3.2* 3.7  CL 93*  --  104 98  CO2 24  --  24 24  GLUCOSE 111*  --  96 96  BUN 32*  --  26* 15  CREATININE 1.33*  --  1.11* 0.87  CALCIUM 9.4  --  8.3* 9.1  MG  --  1.5* 2.1  --   PHOS  --   --  2.7  --     GFR: Estimated Creatinine Clearance: 28.3 mL/min (by C-G formula based on SCr of 0.87 mg/dL).  Liver Function Tests: Recent Labs  Lab 06/26/20 0313  AST 43*  ALT 21  ALKPHOS 56  BILITOT 0.7  PROT 5.8*  ALBUMIN 3.3*    CBG: No results for input(s): GLUCAP in the last 168 hours.   No results  found for this or any previous visit (from the past 240 hour(s)).       Radiology Studies: No results found.      Scheduled Meds: . amLODipine  10 mg Oral Daily  . ARIPiprazole  2 mg Oral Once  . [START ON 06/29/2020] ARIPiprazole  5 mg Oral Daily  . aspirin  325 mg Oral Daily  . escitalopram  20 mg Oral Daily  . feeding supplement  237 mL Oral TID BM  . folic acid  1 mg Oral Daily  . irbesartan  300 mg Oral Daily  . LORazepam  0-4 mg Oral Q12H  . mometasone-formoterol  2 puff Inhalation BID  . multivitamin with minerals  1 tablet Oral Daily  . nicotine  21 mg Transdermal Daily  . rosuvastatin  10 mg Oral QHS  . thiamine  100 mg Oral Daily   Or  . thiamine  100 mg Intravenous Daily   Continuous Infusions:    LOS: 2 days        Hosie Poisson, MD [facility]   To contact the attending provider between 7A-7P or the covering provider during after hours 7P-7A, please log into the web site [email or web address] and access using universal [place] password for that web site. If you do not have the password, please call the hospital operator.  06/28/2020, 1:32 PM

## 2020-06-28 NOTE — Plan of Care (Signed)
  Problem: Education: Goal: Knowledge of General Education information will improve Description: Including pain rating scale, medication(s)/side effects and non-pharmacologic comfort measures Outcome: Progressing   Problem: Health Behavior/Discharge Planning: Goal: Ability to manage health-related needs will improve Outcome: Progressing   Problem: Clinical Measurements: Goal: Ability to maintain clinical measurements within normal limits will improve Outcome: Progressing Goal: Will remain free from infection Outcome: Completed/Met Goal: Diagnostic test results will improve Outcome: Progressing Goal: Respiratory complications will improve Outcome: Progressing Goal: Cardiovascular complication will be avoided Outcome: Progressing   Problem: Activity: Goal: Risk for activity intolerance will decrease Outcome: Progressing   Problem: Nutrition: Goal: Adequate nutrition will be maintained Outcome: Progressing   Problem: Coping: Goal: Level of anxiety will decrease Outcome: Progressing   Problem: Elimination: Goal: Will not experience complications related to bowel motility Outcome: Not Progressing Note: Encouraging patient to increase PO intake, has not had a BM since admission Goal: Will not experience complications related to urinary retention Outcome: Completed/Met   Problem: Pain Managment: Goal: General experience of comfort will improve Outcome: Progressing   Problem: Safety: Goal: Ability to remain free from injury will improve Outcome: Progressing   Problem: Skin Integrity: Goal: Risk for impaired skin integrity will decrease Outcome: Progressing

## 2020-06-29 DIAGNOSIS — N179 Acute kidney failure, unspecified: Secondary | ICD-10-CM | POA: Diagnosis not present

## 2020-06-29 DIAGNOSIS — F1010 Alcohol abuse, uncomplicated: Secondary | ICD-10-CM | POA: Diagnosis not present

## 2020-06-29 DIAGNOSIS — J449 Chronic obstructive pulmonary disease, unspecified: Secondary | ICD-10-CM | POA: Diagnosis not present

## 2020-06-29 DIAGNOSIS — F419 Anxiety disorder, unspecified: Secondary | ICD-10-CM | POA: Diagnosis not present

## 2020-06-29 LAB — CBC
HCT: 32.4 % — ABNORMAL LOW (ref 36.0–46.0)
Hemoglobin: 11.1 g/dL — ABNORMAL LOW (ref 12.0–15.0)
MCH: 36.2 pg — ABNORMAL HIGH (ref 26.0–34.0)
MCHC: 34.3 g/dL (ref 30.0–36.0)
MCV: 105.5 fL — ABNORMAL HIGH (ref 80.0–100.0)
Platelets: 214 10*3/uL (ref 150–400)
RBC: 3.07 MIL/uL — ABNORMAL LOW (ref 3.87–5.11)
RDW: 12.4 % (ref 11.5–15.5)
WBC: 5.7 10*3/uL (ref 4.0–10.5)
nRBC: 0 % (ref 0.0–0.2)

## 2020-06-29 LAB — LIPID PANEL
Cholesterol: 136 mg/dL (ref 0–200)
HDL: 57 mg/dL (ref >40)
LDL Cholesterol: 70 mg/dL (ref 0–99)
Total CHOL/HDL Ratio: 2.4 RATIO
Triglycerides: 46 mg/dL (ref <150)
VLDL: 9 mg/dL (ref 0–40)

## 2020-06-29 LAB — BASIC METABOLIC PANEL
Anion gap: 13 (ref 5–15)
BUN: 19 mg/dL (ref 8–23)
CO2: 25 mmol/L (ref 22–32)
Calcium: 9.2 mg/dL (ref 8.9–10.3)
Chloride: 102 mmol/L (ref 98–111)
Creatinine, Ser: 0.89 mg/dL (ref 0.44–1.00)
GFR, Estimated: >60 mL/min (ref >60)
Glucose, Bld: 93 mg/dL (ref 70–99)
Potassium: 3.2 mmol/L — ABNORMAL LOW (ref 3.5–5.1)
Sodium: 140 mmol/L (ref 135–145)

## 2020-06-29 MED ORDER — POTASSIUM CHLORIDE CRYS ER 20 MEQ PO TBCR
40.0000 meq | EXTENDED_RELEASE_TABLET | Freq: Once | ORAL | Status: AC
  Administered 2020-06-29: 40 meq via ORAL
  Filled 2020-06-29: qty 2

## 2020-06-29 NOTE — Plan of Care (Signed)

## 2020-06-29 NOTE — Progress Notes (Signed)
Physical Therapy Treatment Patient Details Name: Crystal Noble MRN: [id number] DOB: [birth year] Today's Date: 06/29/2020    History of Present Illness HPI: Crystal Noble is a 75 y.o. female with medical history significant for hypertension, CAD, PAD, asymptomatic carotid artery stenosis, and alcohol abuse, presented to the emergency department, brought in by a friend for evaluation of recurrent falls, alcohol abuse, and poor nutrition.  Patient was seen in the emergency department after a fall about a week ago, had extensive work-up including CTA head and neck and MRI brain, was treated for UTI    PT Comments    Pt agreeable to ambulate and reports generalized weakness.  Pt declines SNF for rehab however appears more agreeable to help at home at this time.  Recommend supervision for mobility and HHPT due to unsteadiness and hx of falls.   Follow Up Recommendations  Home health PT;Supervision for mobility/OOB     Equipment Recommendations  Rolling walker with 5" wheels    Recommendations for Other Services       Precautions / Restrictions Precautions Precautions: Fall Restrictions Weight Bearing Restrictions: No    Mobility  Bed Mobility Overal bed mobility: Modified Independent                Transfers Overall transfer level: Needs assistance Equipment used: None Transfers: Sit to/from Stand Sit to Stand: Min assist         General transfer comment: assist to stand and steady, cues for hand placement upon sitting in recliner  Ambulation/Gait Ambulation/Gait assistance: Min guard Gait Distance (Feet): 100 Feet Assistive device: Rolling walker (2 wheeled) Gait Pattern/deviations: Step-through pattern;Decreased stride length;Trunk flexed     General Gait Details: verbal cues for safe use of RW, keeps RW too far forward   Stairs             Wheelchair Mobility    Modified Rankin (Stroke Patients Only)       Balance Overall balance  assessment: Needs assistance         Standing balance support: Bilateral upper extremity supported Standing balance-Leahy Scale: Poor Standing balance comment: reliant on UE support                            Cognition Arousal/Alertness: Awake/alert Behavior During Therapy: WFL for tasks assessed/performed;Flat affect Overall Cognitive Status: Within Functional Limits for tasks assessed                                        Exercises      General Comments        Pertinent Vitals/Pain Pain Assessment: No/denies pain    Home Living                      Prior Function            PT Goals (current goals can now be found in the care plan section) Progress towards PT goals: Progressing toward goals    Frequency    Min 3X/week      PT Plan Discharge plan needs to be updated    Co-evaluation              AM-PAC PT "6 Clicks" Mobility   Outcome Measure  Help needed turning from your back to your side while in a flat bed without using bedrails?: A  Little Help needed moving from lying on your back to sitting on the side of a flat bed without using bedrails?: A Little Help needed moving to and from a bed to a chair (including a wheelchair)?: A Little Help needed standing up from a chair using your arms (e.g., wheelchair or bedside chair)?: A Little Help needed to walk in hospital room?: A Little Help needed climbing 3-5 steps with a railing? : A Little 6 Click Score: 18    End of Session Equipment Utilized During Treatment: Gait belt Activity Tolerance: Patient tolerated treatment well Patient left: with call bell/phone within reach;in chair;with chair alarm set   PT Visit Diagnosis: Unsteadiness on feet (R26.81);History of falling (Z91.81);Difficulty in walking, not elsewhere classified (R26.2)     Time: 1129-1140 PT Time Calculation (min) (ACUTE ONLY): 11 min  Charges:  $Gait Training: 8-22 mins                     Jannette Spanner PT, DPT Acute Rehabilitation Services Pager: [phone] Office: [phone]  York Ram E 06/29/2020, 12:24 PM

## 2020-06-29 NOTE — Plan of Care (Signed)
  Problem: Education: Goal: Knowledge of General Education information will improve Description: Including pain rating scale, medication(s)/side effects and non-pharmacologic comfort measures Outcome: Progressing   Problem: Clinical Measurements: Goal: Respiratory complications will improve Outcome: Progressing   Problem: Activity: Goal: Risk for activity intolerance will decrease Outcome: Progressing   Problem: Coping: Goal: Level of anxiety will decrease Outcome: Progressing   Problem: Pain Managment: Goal: General experience of comfort will improve Outcome: Progressing   Problem: Safety: Goal: Ability to remain free from injury will improve Outcome: Progressing   Problem: Skin Integrity: Goal: Risk for impaired skin integrity will decrease Outcome: Progressing   

## 2020-06-29 NOTE — Progress Notes (Signed)
PROGRESS NOTE    Crystal Noble  [id number] DOB: Aug 01, 1945 DOA: 06/25/2020 PCP: Fanny Bien, MD    Chief Complaint  Patient presents with  . Fatigue  . frequent falls  . UTI  . Failure To Thrive    Brief Narrative:   75 y.o. female with medical history significant for hypertension, CAD, PAD, asymptomatic carotid artery stenosis, and alcohol abuse, presented to the emergency department, brought in by a friend for evaluation of recurrent falls, alcohol abuse, and poor nutrition. :A friend who accompanied her to the ED raised concern that the patient drinks alcohol throughout the day and does not eat or drink much else.  Patient denies feeling particularly depressed or having any intention of harming herself.  She denies any history of alcohol withdrawal and denies any hallucinations. She was found to be hypokalemic, in AKI, . CT head and cervical spine showed Left parietooccipital scalp hematoma. No acute intracranial abnormality. No skull fracture. 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 Moderate chronic microvascular ischemic changes. She has periorbital ecchymosis from mulitple falls.  PT/OT EVAL ordered. Recommended home health PT on discharge .  Patient seen and examined at bedside.  She denies any new complaints and is agreeable to home health PT on discharge.   Assessment & Plan  Principal Problem:   AKI (acute kidney injury) ([place]) Active Problems:   Coronary artery disease involving native coronary artery of native heart without angina pectoris   Essential hypertension   Hypokalemia   Hypomagnesemia   Alcohol abuse   Anxiety disorder   COPD (chronic obstructive pulmonary disease) ([place])   Falls   Alcohol abuse, no withdrawal symptoms at this time. Patient reports drinking brandy every day. Denies any withdrawal symptoms at this time. Monitor patient on CIWA protocol. TOC consult for  resources for alcohol abuse.      Multiple falls recently. Patient has periorbital ecchymosis on the left CT of the head shows scalp hematoma. Recent MRI shows subacute infarct in the right corona radiator. PT OT evaluation ordered.  Home health PT ordered.  As patient refused short-term SNF.    Persistent hypokalemia and hypomagnesemia Replaced    AKI probably secondary to dehydration poor oral intake. Improving with IV fluids. Creatinine back to baseline.    Primary hypertension Hypertensive urgency.  BP parameters are better controlled today with norvasc and avapro.  Resume hydralazine prn.    Mild normocytic anemia Hemoglobin around 11.    COPD No wheezing heard on exam at this time. Continue with bronchodilators as needed.    Depression and anxiety Continue with home medications at this time. Psychiatry consulted for depression, and alcohol abuse.  As per the friend who takes care of the patient, pt has been drinking alcohol every day, falling multiple times, refusing care, home health PT in the past.  Psychiatric consulted and appreciate their recommendations.  Continue with Abilify 5 mg daily, and Lexapro.  Wellbutrin was discontinued by psychiatry.  Patient at this time currently denies any suicidal or homicidal ideations.  And she has capacity to make her own decisions at this time.   Small sub acute infarct evident earlier this month on the MRI brain.  She currently does not have any focal deficits. Started her on aspirin 325 mg daily, lipid panel shows LDL of 70.Marland Kitchen Pt is on 10 mg of crestor, continue the same.  PT eval / OT  Re eval  Ordered.    DVT prophylaxis: (Lovenox) Code Status: (Full Code) Family Communication: none at bedside, discussed with friend over the phone. Friend requesting TOC to call her for disposition.  Disposition:   Status is: Inpatient.   The patient will require care spanning > 2 midnights and should be moved to inpatient  because: Unsafe d/c plan and IV treatments appropriate due to intensity of illness or inability to take PO, will need normalization of BP parameters prior to discharge.   Dispo: The patient is from: Home              Anticipated d/c is to: Home              Anticipated d/c date is: 1 day              Patient currently is not medically stable to d/c.       Consultants:   none   Procedures: None.   Antimicrobials: none.    Subjective: No new complaints at this time Objective: Vitals:   06/28/20 2228 06/29/20 0620 06/29/20 0700 06/29/20 1220  BP: (!) 154/64 (!) 160/71  139/63  Pulse: 81 77  (!) 101  Resp: 20 18  17   Temp: 98 F (36.7 C) 98.7 F (37.1 C)  98.1 F (36.7 C)  TempSrc:  Oral  Oral  SpO2: 98% 96% 97% 98%  Weight:      Height:        Intake/Output Summary (Last 24 hours) at 06/29/2020 1344 Last data filed at 06/29/2020 1000 Gross per 24 hour  Intake 420 ml  Output -  Net 420 ml   Filed Weights   06/25/20 1732 06/25/20 2257 06/27/20 1537  Weight: 38.6 kg 38.6 kg 31.6 kg    Examination:  General exam: Elderly woman not in any kind of distress.   Respiratory system: Air entry fair, no wheezing or rhonchi Cardiovascular system: S1-S2 heard, regular rate rhythm, no pedal edema Gastrointestinal system: Abdomen is soft, nontender, nondistended, bowel sounds normal Central nervous system: Alert and oriented and able to answer all questions  extremities: No pedal edema.  Skin: peri orbital ecchymosis. Psychiatry:  Flat affect.     Data Reviewed: I have personally reviewed following labs and imaging studies  CBC: Recent Labs  Lab 06/25/20 1737 06/26/20 0313 06/29/20 0527  WBC 6.6 5.4 5.7  HGB 12.9 11.1* 11.1*  HCT 36.7 32.2* 32.4*  MCV 102.8* 105.9* 105.5*  PLT 243 192 536    Basic Metabolic Panel: Recent Labs  Lab 06/25/20 1737 06/25/20 2134 06/26/20 0313 06/27/20 1035 06/29/20 0527  NA 135  --  138 135 140  K 3.0*  --  3.2* 3.7  3.2*  CL 93*  --  104 98 102  CO2 24  --  24 24 25   GLUCOSE 111*  --  96 96 93  BUN 32*  --  26* 15 19  CREATININE 1.33*  --  1.11* 0.87 0.89  CALCIUM 9.4  --  8.3* 9.1 9.2  MG  --  1.5* 2.1  --   --   PHOS  --   --  2.7  --   --     GFR: Estimated Creatinine Clearance: 27.7 mL/min (by C-G formula based on SCr of 0.89 mg/dL).  Liver Function Tests: Recent Labs  Lab 06/26/20 0313  AST 43*  ALT 21  ALKPHOS 56  BILITOT 0.7  PROT 5.8*  ALBUMIN 3.3*    CBG: No results for input(s): GLUCAP  in the last 168 hours.   No results found for this or any previous visit (from the past 240 hour(s)).       Radiology Studies: No results found.      Scheduled Meds: . amLODipine  10 mg Oral Daily  . ARIPiprazole  5 mg Oral Daily  . aspirin  325 mg Oral Daily  . escitalopram  20 mg Oral Daily  . feeding supplement  237 mL Oral TID BM  . folic acid  1 mg Oral Daily  . irbesartan  300 mg Oral Daily  . LORazepam  0-4 mg Oral Q12H  . mometasone-formoterol  2 puff Inhalation BID  . multivitamin with minerals  1 tablet Oral Daily  . nicotine  21 mg Transdermal Daily  . rosuvastatin  10 mg Oral QHS  . thiamine  100 mg Oral Daily   Or  . thiamine  100 mg Intravenous Daily   Continuous Infusions:    LOS: 3 days        Hosie Poisson, MD [facility]   To contact the attending provider between 7A-7P or the covering provider during after hours 7P-7A, please log into the web site [email or web address] and access using universal [place] password for that web site. If you do not have the password, please call the hospital operator.  06/29/2020, 1:44 PM

## 2020-06-30 LAB — BASIC METABOLIC PANEL
Anion gap: 11 (ref 5–15)
BUN: 21 mg/dL (ref 8–23)
CO2: 25 mmol/L (ref 22–32)
Calcium: 9.8 mg/dL (ref 8.9–10.3)
Chloride: 105 mmol/L (ref 98–111)
Creatinine, Ser: 0.98 mg/dL (ref 0.44–1.00)
GFR, Estimated: >60 mL/min (ref >60)
Glucose, Bld: 161 mg/dL — ABNORMAL HIGH (ref 70–99)
Potassium: 4 mmol/L (ref 3.5–5.1)
Sodium: 141 mmol/L (ref 135–145)

## 2020-06-30 MED ORDER — FOLIC ACID 1 MG PO TABS: 1.0000 mg | ORAL_TABLET | Freq: Every day | ORAL | 1 refills | Status: DC

## 2020-06-30 MED ORDER — THIAMINE HCL 100 MG PO TABS: 100.0000 mg | ORAL_TABLET | Freq: Every day | ORAL | Status: DC

## 2020-06-30 MED ORDER — SENNOSIDES-DOCUSATE SODIUM 8.6-50 MG PO TABS: 1.0000 | ORAL_TABLET | Freq: Every evening | ORAL | Status: DC | PRN

## 2020-06-30 MED ORDER — ADULT MULTIVITAMIN W/MINERALS CH: 1.0000 | ORAL_TABLET | Freq: Every day | ORAL | Status: DC

## 2020-06-30 MED ORDER — ASPIRIN EC 81 MG PO TBEC: 81.0000 mg | DELAYED_RELEASE_TABLET | Freq: Every day | ORAL | 11 refills | Status: DC

## 2020-06-30 MED ORDER — ENSURE ENLIVE PO LIQD: 237.0000 mL | Freq: Three times a day (TID) | ORAL | 12 refills | Status: AC

## 2020-06-30 MED ORDER — AMLODIPINE BESYLATE 10 MG PO TABS: 10.0000 mg | ORAL_TABLET | Freq: Every day | ORAL | 1 refills | Status: DC

## 2020-06-30 MED ORDER — ARIPIPRAZOLE 5 MG PO TABS: 5.0000 mg | ORAL_TABLET | Freq: Every day | ORAL | 1 refills | Status: DC

## 2020-06-30 NOTE — Plan of Care (Signed)
  Problem: Education: Goal: Knowledge of General Education information will improve Description: Including pain rating scale, medication(s)/side effects and non-pharmacologic comfort measures Outcome: Adequate for Discharge   Problem: Health Behavior/Discharge Planning: Goal: Ability to manage health-related needs will improve Outcome: Adequate for Discharge   Problem: Clinical Measurements: Goal: Ability to maintain clinical measurements within normal limits will improve Outcome: Adequate for Discharge Goal: Diagnostic test results will improve Outcome: Adequate for Discharge Goal: Respiratory complications will improve Outcome: Adequate for Discharge Goal: Cardiovascular complication will be avoided Outcome: Adequate for Discharge   

## 2020-06-30 NOTE — TOC Transition Note (Signed)
Transition of Care [facility]) - CM/SW Discharge Note   Patient Details  Name: HALLEIGH COMES MRN: [id number] Date of Birth: [birth year]  [facility] CM/SW Contact:  Ross Ludwig, LCSW Phone Number: 06/30/2020, 12:52 PM   Clinical Narrative:     Patient will be going home with home health through [facility].  CSW signing off please reconsult with any other social work needs, home health agency has been notified of planned discharge.    Final next level of care: [place] Barriers to Discharge: Barriers Resolved   Patient Goals and CMS Choice Patient states their goals for this hospitalization and ongoing recovery are:: To return back home with home health CMS Medicare.gov Compare Post Acute Care list provided to:: Patient Choice offered to / list presented to : Patient  Discharge Placement  Patient discharging back home with home health through [facility].                     Discharge Plan and Services                          HH Arranged: PT,RN,Social Work,Nurse's Aide [facility] Agency: [place] Date HH Agency Contacted: 06/30/20 Time [place]: 1252 Representative spoke with at [place]: [place] Determinants of Health ([place]) Interventions     Readmission Risk Interventions No flowsheet data found.

## 2020-06-30 NOTE — Progress Notes (Signed)
Patient and her friend, Arville Go, given discharge, medication, and follow up instructions, verbalized understanding, IV removed, personal belongings with patient, family to transport home

## 2020-07-07 DIAGNOSIS — R64 Cachexia: Secondary | ICD-10-CM | POA: Diagnosis not present

## 2020-07-07 DIAGNOSIS — F331 Major depressive disorder, recurrent, moderate: Secondary | ICD-10-CM | POA: Diagnosis not present

## 2020-07-07 DIAGNOSIS — R634 Abnormal weight loss: Secondary | ICD-10-CM | POA: Diagnosis not present

## 2020-07-07 DIAGNOSIS — F411 Generalized anxiety disorder: Secondary | ICD-10-CM | POA: Diagnosis not present

## 2020-07-07 DIAGNOSIS — Z7289 Other problems related to lifestyle: Secondary | ICD-10-CM | POA: Diagnosis not present

## 2020-07-07 DIAGNOSIS — I10 Essential (primary) hypertension: Secondary | ICD-10-CM | POA: Diagnosis not present

## 2020-07-07 DIAGNOSIS — G4700 Insomnia, unspecified: Secondary | ICD-10-CM | POA: Diagnosis not present

## 2020-07-08 NOTE — Discharge Summary (Signed)
Physician Discharge Summary  Crystal Noble GXQ:[id number] DOB: 16-Mar-1946 DOA: 06/25/2020  PCP: Fanny Bien, MD  Admit date: 06/25/2020 Discharge date: 1/18 /2022  Admitted From: HOME.  Disposition:  hOME [place].    Recommendations for Outpatient Follow-up:  1. Follow up with PCP in 1-2 weeks 2. Please obtain BMP/CBC in one week Please follow up with psychiatry as recommended.  Recommend follow up with Neurologist in 2 to 4 weeks.   Discharge Condition:GUARDED.  CODE STATUS: FULL CODE.  Diet recommendation: Heart Healthy   Brief/Interim Summary: 75 y.o.femalewith medical history significant forhypertension, CAD, PAD, asymptomatic carotid artery stenosis, and alcohol abuse, presented to the emergency department, brought in by a friend for evaluation of recurrent falls, alcohol abuse, and poor nutrition. :A friend who accompanied her to the ED raised concern that the patient drinks alcohol throughout the day and does not eat or drink much else. Patient denies feeling particularly depressed or having any intention of harming herself. She denies any history of alcohol withdrawal and denies any hallucinations. She was found to be hypokalemic, in AKI, . CT head and cervical spine showed Left parietooccipital scalp hematoma. No acute intracranial abnormality. No skull fracture. 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 Moderate chronic microvascular ischemic changes. She has periorbital ecchymosis from mulitple falls.  PT/OT EVAL ordered. Recommended home health PT on discharge .  Patient seen and examined at bedside.  She denies any new complaints and is agreeable to home health PT on discharge.   Discharge Diagnoses:  Principal Problem:   AKI (acute kidney injury) ([place]) Active Problems:   Coronary artery disease involving native coronary artery of native heart without angina pectoris   Essential  hypertension   Hypokalemia   Hypomagnesemia   Alcohol abuse   Anxiety disorder   COPD (chronic obstructive pulmonary disease) ([place])   Falls   Alcohol abuse, no withdrawal symptoms at this time. Patient reports drinking brandy every day. Denies any withdrawal symptoms at this time.no withdrawal symptoms.  TOC consult for resources for alcohol abuse.      Multiple falls recently. Patient has periorbital ecchymosis on the left CT of the head shows scalp hematoma. Recent MRI shows subacute infarct in the right corona radiator. PT OT evaluation ordered.  Home health PT ordered.  As patient refused short-term SNF.    Persistent hypokalemia and hypomagnesemia Replaced    AKI probably secondary to dehydration poor oral intake. Improving with IV fluids. Creatinine back to baseline.    Primary hypertension Hypertensive urgency.  BP parameters are better controlled today with norvasc and avapro.     Mild normocytic anemia Hemoglobin around 11.    COPD No wheezing heard on exam at this time. Continue with bronchodilators as needed.    Depression and anxiety Continue with home medications at this time. Psychiatry consulted for depression, and alcohol abuse.  As per the friend who takes care of the patient, pt has been drinking alcohol every day, falling multiple times, refusing care, home health PT in the past.  Psychiatric consulted and appreciate their recommendations.  Continue with Abilify 5 mg daily, and Lexapro.  Wellbutrin was discontinued by psychiatry.  Patient at this time currently denies any suicidal or homicidal ideations.  And she has capacity to make her own decisions at this time.   Small sub acute infarct evident earlier this month on the MRI brain.  She currently does not have  any focal deficits. Started her on aspirin 81  MG, lipid panel shows LDL of 70.Marland Kitchen Pt is on 10 mg of crestor, continue the same.  PT eval / OT  eval recommending  home health.    Discharge Instructions  Discharge Instructions    Diet - low sodium heart healthy   Complete by: As directed    Discharge instructions   Complete by: As directed    Please follow up with NEUROLOGY in 2 to 4 weeks .  Please follow up with PCP in one week.   Increase activity slowly   Complete by: As directed      Allergies as of 06/30/2020      Reactions   Penicillin G Rash      Medication List    STOP taking these medications   buPROPion 150 MG 24 hr tablet Commonly known as: WELLBUTRIN XL   sulfamethoxazole-trimethoprim 800-160 MG tablet Commonly known as: BACTRIM DS     TAKE these medications   amLODipine 10 MG tablet Commonly known as: NORVASC Take 1 tablet (10 mg total) by mouth daily.   ARIPiprazole 5 MG tablet Commonly known as: ABILIFY Take 1 tablet (5 mg total) by mouth daily.   aspirin EC 81 MG tablet Take 1 tablet (81 mg total) by mouth daily. Swallow whole. Notes to patient: 07/01/20   escitalopram 20 MG tablet Commonly known as: LEXAPRO Take 20 mg by mouth daily. Notes to patient: 07/01/20   estradiol 0.1 MG/GM vaginal cream Commonly known as: ESTRACE Place 1 Applicatorful vaginally at bedtime.   feeding supplement Liqd Take 237 mLs by mouth 3 (three) times daily between meals.   fluticasone-salmeterol 230-21 MCG/ACT inhaler Commonly known as: ADVAIR HFA Inhale 2 puffs into the lungs 2 (two) times daily. Notes to patient: [id number]   folic acid 1 MG tablet Commonly known as: FOLVITE Take 1 tablet (1 mg total) by mouth daily.   megestrol 40 MG tablet Commonly known as: MEGACE Take 40 mg by mouth in the morning, at noon, and at bedtime. Notes to patient: 06/30/20   multivitamin with minerals Tabs tablet Take 1 tablet by mouth daily.   rosuvastatin 10 MG tablet Commonly known as: CRESTOR Take 10 mg by mouth at bedtime. Notes to patient: 06/30/20   senna-docusate 8.6-50 MG tablet Commonly known as: Senokot-S Take 1  tablet by mouth at bedtime as needed for mild constipation.   thiamine 100 MG tablet Take 1 tablet (100 mg total) by mouth daily.   valsartan-hydrochlorothiazide 320-25 MG tablet Commonly known as: DIOVAN-HCT Take 1 tablet by mouth daily. Notes to patient: 06/30/20       Follow-up Information    Fanny Bien, MD. Schedule an appointment as soon as possible for a visit in 1 week(s).   Specialty: Family Medicine Contact information: [address] [place] [address] [phone]        Garvin Fila, MD. Schedule an appointment as soon as possible for a visit in 2 week(s).   Specialties: Neurology, Radiology Contact information: [address] [place] [address] [phone]              Allergies  Allergen Reactions  . Penicillin G Rash    Consultations:  =psychiatry.    Procedures/Studies: CT Angio Head W or Wo Contrast  Result Date: 06/18/2020 CLINICAL DATA:  Neuro deficit, acute, stroke suspected dizziness with falls for 2 weeks; Vertigo, central persistent dizziness for 2 weeks, BP over 200 EXAM:  CT ANGIOGRAPHY HEAD AND NECK TECHNIQUE: Multidetector CT imaging of the head and neck was performed using the standard protocol during bolus administration of intravenous contrast. Multiplanar CT image reconstructions and MIPs were obtained to evaluate the vascular anatomy. Carotid stenosis measurements (when applicable) are obtained utilizing NASCET criteria, using the distal internal carotid diameter as the denominator. CONTRAST:  194mL OMNIPAQUE IOHEXOL 350 MG/ML SOLN COMPARISON:  03/26/2020. FINDINGS: CT HEAD FINDINGS Brain: No acute infarct or intracranial hemorrhage. No mass lesion. No midline shift, ventriculomegaly or extra-axial fluid collection. Moderate cerebral atrophy with ex vacuo dilatation. Chronic microvascular ischemic changes. Vascular: No hyperdense vessel or unexpected calcification. Bilateral skull base atherosclerotic  calcifications. Skull: Negative for fracture or focal lesion. Minimal left parietal scalp soft tissue swelling. Sinuses/Orbits: Please see concurrent maxillofacial CT for better evaluation. Other: None. Review of the MIP images confirms the above findings CTA NECK FINDINGS Aortic arch: Atherosclerotic calcifications involving the aortic arch. Standard branching. Moderate narrowing of the proximal left subclavian artery secondary to calcified atheromatous plaque. Mild to moderate narrowing of the innominate artery. Right carotid system: Patent. Bifurcation calcified atheromatous plaque with approximately 40% narrowing of the carotid bulb. 20% narrowing of the proximal ICA. Left carotid system: Patent. Bifurcation calcified and noncalcified atheromatous plaque. 80-90% narrowing of the carotid bulb/proximal ICA. Vertebral arteries: Dominant right vertebral artery.  Patent. Skeleton: Multilevel spondylosis with reversal of cervical lordosis. No acute finding. Other neck: No acute finding. Upper chest: Emphysema.  No acute finding. Review of the MIP images confirms the above findings CTA HEAD FINDINGS Anterior circulation: Bilateral carotid siphon atherosclerotic calcifications mild narrowing of the cavernous segments. Patent MCAs. Right A1 segment hypoplasia. Patent ACAs. Posterior circulation: Patent V4 segments and PICA. Patent basilar and superior cerebellar arteries. Fetal origin of the left PCA. Mild narrowing of the right P1 segment. Venous sinuses: As permitted by contrast timing, patent. Anatomic variants: Please see above. Review of the MIP images confirms the above findings IMPRESSION: Head CT: No acute intracranial process. Left parietal scalp soft tissue swelling. Moderate cerebral atrophy and chronic microvascular ischemic changes. CTA neck: 80-90% narrowing of the left carotid bulb/proximal ICA. 40% narrowing of the right carotid bulb and 20% narrowing of the proximal right ICA. CTA head: No large vessel  occlusion or high-grade narrowing. Mild bilateral cavernous segment and right P1 segment narrowing. Electronically Signed   By: Primitivo Gauze M.D.   On: 06/18/2020 13:23   CT Head Wo Contrast  Result Date: 06/25/2020 CLINICAL DATA:  Polytrauma, critical, head/C-spine injury suspected; Facial trauma Polytrauma, critical, head/C-spine injury suspected Frequent falls. EXAM: CT HEAD WITHOUT CONTRAST CT CERVICAL SPINE WITHOUT CONTRAST TECHNIQUE: Multidetector CT imaging of the head and cervical spine was performed following the standard protocol without intravenous contrast. Multiplanar CT image reconstructions of the cervical spine were also generated. COMPARISON:  Head CT and brain MRI 06/18/2020, 1 week prior. FINDINGS: CT HEAD FINDINGS Brain: Stable degree of atrophy and chronic small vessel ischemia, advanced for age. No intracranial hemorrhage, mass effect, or midline shift. No hydrocephalus. The basilar cisterns are patent. 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 No evidence of territorial infarct or acute ischemia. No extra-axial or intracranial fluid collection. Vascular: Atherosclerosis of skullbase vasculature without hyperdense vessel or abnormal calcification. Skull: No fracture or focal lesion. Sinuses/Orbits: Paranasal sinuses and mastoid air cells are clear. The visualized orbits are unremarkable. Other: Left parietooccipital scalp hematoma. CT CERVICAL SPINE FINDINGS Alignment: Straightening of normal lordosis. Broad-based rightward scoliotic curvature versus  positioning. Questionable widening of right C5-C6 facet, unchanged from recent exam. Skull base and vertebrae: No acute fracture. Vertebral body heights are maintained. The dens and skull base are intact. Mild degenerative pannus at C1-C2. Soft tissues and spinal canal: No prevertebral fluid or swelling. No visible canal hematoma. Disc levels: Disc space narrowing and endplate spurring at every level,  most prominent at C5-C6 and C6-C7. There is multilevel facet hypertrophy. No high-grade canal stenosis. Upper chest: Minimal emphysema. Other: None. IMPRESSION: 1. Left parietooccipital scalp hematoma. No acute intracranial abnormality. No skull fracture. 2. 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 3. Stable atrophy and chronic small vessel ischemia, advanced for age. 4. Multilevel degenerative change throughout the cervical spine without acute fracture or subluxation. Electronically Signed   By: Keith Rake M.D.   On: 06/25/2020 22:24   CT Angio Neck W and/or Wo Contrast  Result Date: 06/18/2020 CLINICAL DATA:  Neuro deficit, acute, stroke suspected dizziness with falls for 2 weeks; Vertigo, central persistent dizziness for 2 weeks, BP over 200 EXAM: CT ANGIOGRAPHY HEAD AND NECK TECHNIQUE: Multidetector CT imaging of the head and neck was performed using the standard protocol during bolus administration of intravenous contrast. Multiplanar CT image reconstructions and MIPs were obtained to evaluate the vascular anatomy. Carotid stenosis measurements (when applicable) are obtained utilizing NASCET criteria, using the distal internal carotid diameter as the denominator. CONTRAST:  164mL OMNIPAQUE IOHEXOL 350 MG/ML SOLN COMPARISON:  03/26/2020. FINDINGS: CT HEAD FINDINGS Brain: No acute infarct or intracranial hemorrhage. No mass lesion. No midline shift, ventriculomegaly or extra-axial fluid collection. Moderate cerebral atrophy with ex vacuo dilatation. Chronic microvascular ischemic changes. Vascular: No hyperdense vessel or unexpected calcification. Bilateral skull base atherosclerotic calcifications. Skull: Negative for fracture or focal lesion. Minimal left parietal scalp soft tissue swelling. Sinuses/Orbits: Please see concurrent maxillofacial CT for better evaluation. Other: None. Review of the MIP images confirms the above findings CTA NECK FINDINGS  Aortic arch: Atherosclerotic calcifications involving the aortic arch. Standard branching. Moderate narrowing of the proximal left subclavian artery secondary to calcified atheromatous plaque. Mild to moderate narrowing of the innominate artery. Right carotid system: Patent. Bifurcation calcified atheromatous plaque with approximately 40% narrowing of the carotid bulb. 20% narrowing of the proximal ICA. Left carotid system: Patent. Bifurcation calcified and noncalcified atheromatous plaque. 80-90% narrowing of the carotid bulb/proximal ICA. Vertebral arteries: Dominant right vertebral artery.  Patent. Skeleton: Multilevel spondylosis with reversal of cervical lordosis. No acute finding. Other neck: No acute finding. Upper chest: Emphysema.  No acute finding. Review of the MIP images confirms the above findings CTA HEAD FINDINGS Anterior circulation: Bilateral carotid siphon atherosclerotic calcifications mild narrowing of the cavernous segments. Patent MCAs. Right A1 segment hypoplasia. Patent ACAs. Posterior circulation: Patent V4 segments and PICA. Patent basilar and superior cerebellar arteries. Fetal origin of the left PCA. Mild narrowing of the right P1 segment. Venous sinuses: As permitted by contrast timing, patent. Anatomic variants: Please see above. Review of the MIP images confirms the above findings IMPRESSION: Head CT: No acute intracranial process. Left parietal scalp soft tissue swelling. Moderate cerebral atrophy and chronic microvascular ischemic changes. CTA neck: 80-90% narrowing of the left carotid bulb/proximal ICA. 40% narrowing of the right carotid bulb and 20% narrowing of the proximal right ICA. CTA head: No large vessel occlusion or high-grade narrowing. Mild bilateral cavernous segment and right P1 segment narrowing. Electronically Signed   By: Primitivo Gauze M.D.   On: 06/18/2020 13:23  CT Cervical Spine Wo Contrast  Result Date: 06/25/2020 CLINICAL DATA:  Polytrauma, critical,  head/C-spine injury suspected; Facial trauma Polytrauma, critical, head/C-spine injury suspected Frequent falls. EXAM: CT HEAD WITHOUT CONTRAST CT CERVICAL SPINE WITHOUT CONTRAST TECHNIQUE: Multidetector CT imaging of the head and cervical spine was performed following the standard protocol without intravenous contrast. Multiplanar CT image reconstructions of the cervical spine were also generated. COMPARISON:  Head CT and brain MRI 06/18/2020, 1 week prior. FINDINGS: CT HEAD FINDINGS Brain: Stable degree of atrophy and chronic small vessel ischemia, advanced for age. No intracranial hemorrhage, mass effect, or midline shift. No hydrocephalus. The basilar cisterns are patent. 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 No evidence of territorial infarct or acute ischemia. No extra-axial or intracranial fluid collection. Vascular: Atherosclerosis of skullbase vasculature without hyperdense vessel or abnormal calcification. Skull: No fracture or focal lesion. Sinuses/Orbits: Paranasal sinuses and mastoid air cells are clear. The visualized orbits are unremarkable. Other: Left parietooccipital scalp hematoma. CT CERVICAL SPINE FINDINGS Alignment: Straightening of normal lordosis. Broad-based rightward scoliotic curvature versus positioning. Questionable widening of right C5-C6 facet, unchanged from recent exam. Skull base and vertebrae: No acute fracture. Vertebral body heights are maintained. The dens and skull base are intact. Mild degenerative pannus at C1-C2. Soft tissues and spinal canal: No prevertebral fluid or swelling. No visible canal hematoma. Disc levels: Disc space narrowing and endplate spurring at every level, most prominent at C5-C6 and C6-C7. There is multilevel facet hypertrophy. No high-grade canal stenosis. Upper chest: Minimal emphysema. Other: None. IMPRESSION: 1. Left parietooccipital scalp hematoma. No acute intracranial abnormality. No skull fracture. 2. No CT  correlate for the right corona radiata subacute to chronic infarct on recent MRI. 3. Stable atrophy and chronic small vessel ischemia, advanced for age. 4. Multilevel degenerative change throughout the cervical spine without acute fracture or subluxation. Electronically Signed   By: Keith Rake M.D.   On: 06/25/2020 22:24   MR BRAIN WO CONTRAST  Result Date: 06/18/2020 CLINICAL DATA:  Dizziness, recent fall EXAM: MRI HEAD WITHOUT CONTRAST TECHNIQUE: Multiplanar, multiecho pulse sequences of the brain and surrounding structures were obtained without intravenous contrast. COMPARISON:  None. FINDINGS: Brain: Punctate focus of mild diffusion hyperintensity with ADC iso to hyperintensity involving the right corona radiata. No evidence of hemorrhage. There is no intracranial mass or mass effect. There is no hydrocephalus or extra-axial fluid collection. Patchy and confluent areas of T2 hyperintensity in the supratentorial white matter are nonspecific but probably reflect moderate chronic microvascular ischemic changes. Prominence of the ventricles and sulci reflects generalized parenchymal volume loss. Vascular: Major vessel flow voids at the skull base are preserved. Skull and upper cervical spine: Normal marrow signal is preserved. Sinuses/Orbits: Paranasal sinuses are aerated. Orbits are unremarkable. Other: Sella is unremarkable.  Mastoid air cells are clear. 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 Moderate chronic microvascular ischemic changes. Electronically Signed   By: Macy Mis M.D.   On: 06/18/2020 14:45   CT Maxillofacial Wo Contrast  Result Date: 06/18/2020 CLINICAL DATA:  Facial trauma. EXAM: CT MAXILLOFACIAL WITHOUT CONTRAST TECHNIQUE: Multidetector CT imaging of the maxillofacial structures was performed. Multiplanar CT image reconstructions were also generated. COMPARISON:  None. FINDINGS: Osseous: Mildly displaced nasal bone fracture is noted. Mandible  is unremarkable. Orbits: Negative. No traumatic or inflammatory finding. Sinuses: Clear. Soft tissues: Negative. Limited intracranial: No significant or unexpected finding. IMPRESSION: Mildly displaced nasal bone fracture. Electronically Signed   By: Jeneen Rinks  Murlean Caller M.D.   On: 06/18/2020 13:02     Subjective: No new complaints.   Discharge Exam: Vitals:   06/30/20 0808 06/30/20 1212  BP:  131/62  Pulse:  88  Resp:  18  Temp:  97.9 F (36.6 C)  SpO2: 99% 96%   Vitals:   06/29/20 2206 06/30/20 0406 06/30/20 0808 06/30/20 1212  BP: 134/67 (!) 155/68  131/62  Pulse: 86 75  88  Resp: 18 16  18   Temp: 98.3 F (36.8 C) (!) 97.5 F (36.4 C)  97.9 F (36.6 C)  TempSrc: Oral Oral  Oral  SpO2: 92% 100% 99% 96%  Weight:      Height:        General: Pt is alert, awake, not in acute distress Cardiovascular: RRR, S1/S2 +, no rubs, no gallops Respiratory: CTA bilaterally, no wheezing, no rhonchi Abdominal: Soft, NT, ND, bowel sounds + Extremities: no edema, no cyanosis    The results of significant diagnostics from this hospitalization (including imaging, microbiology, ancillary and laboratory) are listed below for reference.     Microbiology: No results found for this or any previous visit (from the past 240 hour(s)).   Labs: BNP (last 3 results) No results for input(s): BNP in the last 8760 hours. Basic Metabolic Panel: No results for input(s): NA, K, CL, CO2, GLUCOSE, BUN, CREATININE, CALCIUM, MG, PHOS in the last 168 hours. Liver Function Tests: No results for input(s): AST, ALT, ALKPHOS, BILITOT, PROT, ALBUMIN in the last 168 hours. No results for input(s): LIPASE, AMYLASE in the last 168 hours. No results for input(s): AMMONIA in the last 168 hours. CBC: No results for input(s): WBC, NEUTROABS, HGB, HCT, MCV, PLT in the last 168 hours. Cardiac Enzymes: No results for input(s): CKTOTAL, CKMB, CKMBINDEX, TROPONINI in the last 168 hours. BNP: Invalid input(s):  POCBNP CBG: No results for input(s): GLUCAP in the last 168 hours. D-Dimer No results for input(s): DDIMER in the last 72 hours. Hgb A1c No results for input(s): HGBA1C in the last 72 hours. Lipid Profile No results for input(s): CHOL, HDL, LDLCALC, TRIG, CHOLHDL, LDLDIRECT in the last 72 hours. Thyroid function studies No results for input(s): TSH, T4TOTAL, T3FREE, THYROIDAB in the last 72 hours.  Invalid input(s): FREET3 Anemia work up No results for input(s): VITAMINB12, FOLATE, FERRITIN, TIBC, IRON, RETICCTPCT in the last 72 hours. Urinalysis    Component Value Date/Time   COLORURINE YELLOW 06/25/2020 2237   APPEARANCEUR HAZY (A) 06/25/2020 2237   LABSPEC 1.021 06/25/2020 2237   PHURINE 5.0 06/25/2020 2237   GLUCOSEU NEGATIVE 06/25/2020 2237   HGBUR NEGATIVE 06/25/2020 2237   BILIRUBINUR NEGATIVE 06/25/2020 2237   KETONESUR 5 (A) 06/25/2020 2237   PROTEINUR 30 (A) 06/25/2020 2237   NITRITE NEGATIVE 06/25/2020 2237   LEUKOCYTESUR TRACE (A) 06/25/2020 2237   Sepsis Labs Invalid input(s): PROCALCITONIN,  WBC,  LACTICIDVEN Microbiology No results found for this or any previous visit (from the past 240 hour(s)).   Time coordinating discharge: 33 minutes.  SIGNED:   Hosie Poisson, MD  [facility]

## 2020-07-23 ENCOUNTER — Other Ambulatory Visit: Payer: Self-pay

## 2020-07-23 ENCOUNTER — Telehealth: Payer: Self-pay | Admitting: Neurology

## 2020-07-23 ENCOUNTER — Encounter: Payer: Self-pay | Admitting: Neurology

## 2020-07-23 ENCOUNTER — Ambulatory Visit (INDEPENDENT_AMBULATORY_CARE_PROVIDER_SITE_OTHER): Payer: Medicare Other | Admitting: Neurology

## 2020-07-23 VITALS — BP 147/92 | HR 90 | Ht 60.0 in | Wt 78.2 lb

## 2020-07-23 DIAGNOSIS — R270 Ataxia, unspecified: Secondary | ICD-10-CM

## 2020-07-23 DIAGNOSIS — I6523 Occlusion and stenosis of bilateral carotid arteries: Secondary | ICD-10-CM | POA: Diagnosis not present

## 2020-07-23 DIAGNOSIS — I6930 Unspecified sequelae of cerebral infarction: Secondary | ICD-10-CM

## 2020-07-23 DIAGNOSIS — R269 Unspecified abnormalities of gait and mobility: Secondary | ICD-10-CM | POA: Diagnosis not present

## 2020-07-23 DIAGNOSIS — R413 Other amnesia: Secondary | ICD-10-CM

## 2020-07-23 MED ORDER — CLOPIDOGREL BISULFATE 75 MG PO TABS: 75.0000 mg | ORAL_TABLET | Freq: Every day | ORAL | 11 refills | Status: DC

## 2020-07-23 NOTE — Patient Instructions (Addendum)
I had a long discussion with the patient and her cousin regarding her complaints of gait ataxia which likely appears to be multifactorial due to combination of small lacunar stroke as well as alcohol intake and possibly underlying peripheral neuropathy.  I recommend she start taking Plavix 75 mg daily for secondary stroke prevention and maintain aggressive risk factor modification with strict control of hypertension with blood pressure goal below 140/90, lipids with LDL cholesterol goal below 70 mg percent and diabetes with hemoglobin A1c goal below 6.5.  She was encouraged to eat a healthy diet and to be active Regularly.  I also strongly encouraged her to quit smoking completely as well as to cut back alcohol intake to not more than 1 drink per day.  Continue ongoing home PT/OT.Check lab work for reversible causes of memory loss, EEG, neuropathy panel labs and check EMG nerve conduction study.  Check carotid ultrasound to follow her left carotid stenosis.  We also discussed fall prevention precautions.  She will return for follow-up in the future in 2 months or call earlier if necessary.  Fall Prevention in the Home, Adult Falls can cause injuries and can happen to people of all ages. There are many things you can do to make your home safe and to help prevent falls. Ask for help when making these changes. What actions can I take to prevent falls? General Instructions  Use good lighting in all rooms. Replace any light bulbs that burn out.  Turn on the lights in dark areas. Use night-lights.  Keep items that you use often in easy-to-reach places. Lower the shelves around your home if needed.  Set up your furniture so you have a clear path. Avoid moving your furniture around.  Do not have throw rugs or other things on the floor that can make you trip.  Avoid walking on wet floors.  If any of your floors are uneven, fix them.  Add color or contrast paint or tape to clearly mark and help you  see: ? Grab bars or handrails. ? First and last steps of staircases. ? Where the edge of each step is.  If you use a stepladder: ? Make sure that it is fully opened. Do not climb a closed stepladder. ? Make sure the sides of the stepladder are locked in place. ? Ask someone to hold the stepladder while you use it.  Know where your pets are when moving through your home. What can I do in the bathroom?  Keep the floor dry. Clean up any water on the floor right away.  Remove soap buildup in the tub or shower.  Use nonskid mats or decals on the floor of the tub or shower.  Attach bath mats securely with double-sided, nonslip rug tape.  If you need to sit down in the shower, use a plastic, nonslip stool.  Install grab bars by the toilet and in the tub and shower. Do not use towel bars as grab bars.      What can I do in the bedroom?  Make sure that you have a light by your bed that is easy to reach.  Do not use any sheets or blankets for your bed that hang to the floor.  Have a firm chair with side arms that you can use for support when you get dressed. What can I do in the kitchen?  Clean up any spills right away.  If you need to reach something above you, use a step stool with a  grab bar.  Keep electrical cords out of the way.  Do not use floor polish or wax that makes floors slippery. What can I do with my stairs?  Do not leave any items on the stairs.  Make sure that you have a light switch at the top and the bottom of the stairs.  Make sure that there are handrails on both sides of the stairs. Fix handrails that are broken or loose.  Install nonslip stair treads on all your stairs.  Avoid having throw rugs at the top or bottom of the stairs.  Choose a carpet that does not hide the edge of the steps on the stairs.  Check carpeting to make sure that it is firmly attached to the stairs. Fix carpet that is loose or worn. What can I do on the outside of my  home?  Use bright outdoor lighting.  Fix the edges of walkways and driveways and fix any cracks.  Remove anything that might make you trip as you walk through a door, such as a raised step or threshold.  Trim any bushes or trees on paths to your home.  Check to see if handrails are loose or broken and that both sides of all steps have handrails.  Install guardrails along the edges of any raised decks and porches.  Clear paths of anything that can make you trip, such as tools or rocks.  Have leaves, snow, or ice cleared regularly.  Use sand or salt on paths during winter.  Clean up any spills in your garage right away. This includes grease or oil spills. What other actions can I take?  Wear shoes that: ? Have a low heel. Do not wear high heels. ? Have rubber bottoms. ? Feel good on your feet and fit well. ? Are closed at the toe. Do not wear open-toe sandals.  Use tools that help you move around if needed. These include: ? Canes. ? Walkers. ? Scooters. ? Crutches.  Review your medicines with your doctor. Some medicines can make you feel dizzy. This can increase your chance of falling. Ask your doctor what else you can do to help prevent falls. Where to find more information  Centers for Disease Control and Prevention, STEADI: http://www.wolf.info/  National Institute on Aging: http://kim-miller.com/ Contact a doctor if:  You are afraid of falling at home.  You feel weak, drowsy, or dizzy at home.  You fall at home. Summary  There are many simple things that you can do to make your home safe and to help prevent falls.  Ways to make your home safe include removing things that can make you trip and installing grab bars in the bathroom.  Ask for help when making these changes in your home. This information is not intended to replace advice given to you by your health care provider. Make sure you discuss any questions you have with your health care provider. Document Revised:  01/01/2020 Document Reviewed: 01/01/2020 Elsevier Patient Education  2021 [place].  Stroke Prevention Some medical conditions and behaviors are associated with a higher chance of having a stroke. You can help prevent a stroke by making nutrition, lifestyle, and other changes, including managing any medical conditions you may have. What nutrition changes can be made?  Eat healthy foods. You can do this by: ? Choosing foods high in fiber, such as fresh fruits and vegetables and whole grains. ? Eating at least 5 or more servings of fruits and vegetables a day. Try to fill  half of your plate at each meal with fruits and vegetables. ? Choosing lean protein foods, such as lean cuts of meat, poultry without skin, fish, tofu, beans, and nuts. ? Eating low-fat dairy products. ? Avoiding foods that are high in salt (sodium). This can help lower blood pressure. ? Avoiding foods that have saturated fat, trans fat, and cholesterol. This can help prevent high cholesterol. ? Avoiding processed and premade foods.  Follow your health care provider's specific guidelines for losing weight, controlling high blood pressure (hypertension), lowering high cholesterol, and managing diabetes. These may include: ? Reducing your daily calorie intake. ? Limiting your daily sodium intake to 1,500 milligrams (mg). ? Using only healthy fats for cooking, such as olive oil, canola oil, or sunflower oil. ? Counting your daily carbohydrate intake.   What lifestyle changes can be made?  Maintain a healthy weight. Talk to your health care provider about your ideal weight.  Get at least 30 minutes of moderate physical activity at least 5 days a week. Moderate activity includes brisk walking, biking, and swimming.  Do not use any products that contain nicotine or tobacco, such as cigarettes and e-cigarettes. If you need help quitting, ask your health care provider. It may also be helpful to avoid exposure to secondhand  smoke.  Limit alcohol intake to no more than 1 drink a day for nonpregnant women and 2 drinks a day for men. One drink equals 12 oz of beer, 5 oz of wine, or 1 oz of hard liquor.  Stop any illegal drug use.  Avoid taking birth control pills. Talk to your health care provider about the risks of taking birth control pills if: ? You are over 74 years old. ? You smoke. ? You get migraines. ? You have ever had a blood clot. What other changes can be made?  Manage your cholesterol levels. ? Eating a healthy diet is important for preventing high cholesterol. If cholesterol cannot be managed through diet alone, you may also need to take medicines. ? Take any prescribed medicines to control your cholesterol as told by your health care provider.  Manage your diabetes. ? Eating a healthy diet and exercising regularly are important parts of managing your blood sugar. If your blood sugar cannot be managed through diet and exercise, you may need to take medicines. ? Take any prescribed medicines to control your diabetes as told by your health care provider.  Control your hypertension. ? To reduce your risk of stroke, try to keep your blood pressure below 130/80. ? Eating a healthy diet and exercising regularly are an important part of controlling your blood pressure. If your blood pressure cannot be managed through diet and exercise, you may need to take medicines. ? Take any prescribed medicines to control hypertension as told by your health care provider. ? Ask your health care provider if you should monitor your blood pressure at home. ? Have your blood pressure checked every year, even if your blood pressure is normal. Blood pressure increases with age and some medical conditions.  Get evaluated for sleep disorders (sleep apnea). Talk to your health care provider about getting a sleep evaluation if you snore a lot or have excessive sleepiness.  Take over-the-counter and prescription medicines  only as told by your health care provider. Aspirin or blood thinners (antiplatelets or anticoagulants) may be recommended to reduce your risk of forming blood clots that can lead to stroke.  Make sure that any other medical conditions you have, such as  atrial fibrillation or atherosclerosis, are managed. What are the warning signs of a stroke? The warning signs of a stroke can be easily remembered as BEFAST.  B is for balance. Signs include: ? Dizziness. ? Loss of balance or coordination. ? Sudden trouble walking.  E is for eyes. Signs include: ? A sudden change in vision. ? Trouble seeing.  F is for face. Signs include: ? Sudden weakness or numbness of the face. ? The face or eyelid drooping to one side.  A is for arms. Signs include: ? Sudden weakness or numbness of the arm, usually on one side of the body.  S is for speech. Signs include: ? Trouble speaking (aphasia). ? Trouble understanding.  T is for time. ? These symptoms may represent a serious problem that is an emergency. Do not wait to see if the symptoms will go away. Get medical help right away. Call your local emergency services (911 in the [place] Do not drive yourself to the hospital.  Other signs of stroke may include: ? A sudden, severe headache with no known cause. ? Nausea or vomiting. ? Seizure. Where to find more information For more information, visit:  American Stroke Association: www.strokeassociation.org  National Stroke Association: www.stroke.org Summary  You can prevent a stroke by eating healthy, exercising, not smoking, limiting alcohol intake, and managing any medical conditions you may have.  Do not use any products that contain nicotine or tobacco, such as cigarettes and e-cigarettes. If you need help quitting, ask your health care provider. It may also be helpful to avoid exposure to secondhand smoke.  Remember BEFAST for warning signs of stroke. Get help right away if you or a loved one  has any of these signs. This information is not intended to replace advice given to you by your health care provider. Make sure you discuss any questions you have with your health care provider. Document Revised: 05/12/2017 Document Reviewed: 07/05/2016 Elsevier Patient Education  2021 [employer].

## 2020-07-23 NOTE — Progress Notes (Signed)
[facility] [address] [place]. [place] [address] [phone]       OFFICE CONSULT NOTE  [name] Date of Birth:  1945/09/13 Medical Record Number:  [id number]   Referring MD: Crystal Noble  Reason for Referral: Dizziness  HPI: Crystal Noble is a 75 year old Caucasian lady is accompanied today by her cousin Crystal Noble.  History is obtained from them and review of electronic medical records and I personally reviewed imaging films in PACS.  Patient states that she developed sudden onset of dizziness towards the end of December.  She is unable to give me the exact date.  She felt off balance and had to hold onto the walls.  She denies true vertigo, double vision focal extremity weakness or numbness.  She had no nausea.  She denied any vision changes.  She feels off balance sensation is still persisting.  She had an MRI scan of the brain on 06/18/2020 which showed a small subacute right corona radiata lacunar infarct.  CT angiogram of the neck shows 80 to 90% left ICA bulb stenosis and 40% right ICA stenosis.  Last year she had had a carotid ultrasound on 09/09/2019 which had shown only 50 to 69% left ICA stenosis.  Patient's LDL cholesterol was 70 mg percent on 06/29/2020.  Patient is currently getting home physical and occupational therapy.  She can walk independently with his scheduled fall.  She had 1 fall and developed a bruise on her face.  She is on Plavix and tolerating it well without bleeding.  Patient also complains of memory difficulties that she has noticed.  She is lives alone but is noticed some trouble doing her bills and remembering recent information for the last month or so.  Patient admits to drinking at least 3 alcoholic drinks a day as well as she is is a chronic heavy smoker.  She has not cut back or is trying to quit despite having a recent stroke.  She complains of numbness and tingling in the feet but it is not bothersome.  She has not had any  evaluation in the past for neuropathy or reversible causes of memory loss. ROS:   14 system review of systems is positive for memory loss, imbalance, dizziness, cognitive difficulties all other systems negative  PMH:  Past Medical History:  Diagnosis Date  . Carotid stenosis, asymptomatic   . Colitis, collagenous   . Hypertension   . Stroke [facility]) 06/2019    Social History:  Social History   Socioeconomic History  . Marital status: Widowed    Spouse name: Not on file  . Number of children: Not on file  . Years of education: Not on file  . Highest education level: Not on file  Occupational History  . Not on file  Tobacco Use  . Smoking status: Current Every Day Smoker    Packs/day: 0.50    Years: 30.00    Pack years: 15.00    Types: Cigarettes  . Smokeless tobacco: Never Used  Vaping Use  . Vaping Use: Never used  Substance and Sexual Activity  . Alcohol use: Yes    Alcohol/week: 21.0 standard drinks    Types: 21 Glasses of wine per week  . Drug use: No  . Sexual activity: Not on file  Other Topics Concern  . Not on file  Social History Narrative   Lives alone   Right Handed   Drinks 2-4 cups caffeine dialy   Social Determinants of Health   Financial  Resource Strain: Not on file  Food Insecurity: Not on file  Transportation Needs: Not on file  Physical Activity: Not on file  Stress: Not on file  Social Connections: Not on file  Intimate Partner Violence: Not on file    Medications:   Current Outpatient Medications on File Prior to Visit  Medication Sig Dispense Refill  . amLODipine (NORVASC) 10 MG tablet Take 1 tablet (10 mg total) by mouth daily. 30 tablet 1  . ARIPiprazole (ABILIFY) 5 MG tablet Take 1 tablet (5 mg total) by mouth daily. 30 tablet 1  . escitalopram (LEXAPRO) 20 MG tablet Take 20 mg by mouth daily.    . fluticasone-salmeterol (ADVAIR HFA) 230-21 MCG/ACT inhaler Inhale 2 puffs into the lungs 2 (two) times daily.    . folic acid (FOLVITE) 1  MG tablet Take 1 tablet (1 mg total) by mouth daily. 30 tablet 1  . megestrol (MEGACE) 40 MG tablet Take 40 mg by mouth in the morning, at noon, and at bedtime.    . montelukast (SINGULAIR) 10 MG tablet Take 10 mg by mouth at bedtime.    . Multiple Vitamin (MULTIVITAMIN WITH MINERALS) TABS tablet Take 1 tablet by mouth daily.    . rosuvastatin (CRESTOR) 10 MG tablet Take 10 mg by mouth at bedtime.    . thiamine 100 MG tablet Take 1 tablet (100 mg total) by mouth daily.    . valsartan-hydrochlorothiazide (DIOVAN-HCT) 320-25 MG tablet Take 1 tablet by mouth daily.    Marland Kitchen aspirin EC 81 MG tablet Take 1 tablet (81 mg total) by mouth daily. Swallow whole. 30 tablet 11  . estradiol (ESTRACE) 0.1 MG/GM vaginal cream Place 1 Applicatorful vaginally at bedtime.    . feeding supplement (ENSURE ENLIVE / ENSURE PLUS) LIQD Take 237 mLs by mouth 3 (three) times daily between meals. 21330 mL 12  . senna-docusate (SENOKOT-S) 8.6-50 MG tablet Take 1 tablet by mouth at bedtime as needed for mild constipation.     No current facility-administered medications on file prior to visit.    Allergies:   Allergies  Allergen Reactions  . Penicillin G Rash    Physical Exam General: Frail elderly cachectic looking Caucasian lady, seated, in no evident distress Head: head normocephalic and atraumatic.   Neck: supple with soft left carotid bruit Cardiovascular: regular rate and soft ejection murmur over precordium Musculoskeletal: no deformity Skin:  no rash/petichiae Vascular:  Normal pulses all extremities  Neurologic Exam Mental Status: Awake and fully alert. Oriented to place and time. Recent and remote memory intact. Attention span, concentration and fund of knowledge appropriate. Mood and affect appropriate.  Diminished recall 1/3.  Able to name only 4 animals which can walk on 4 legs.  Clock drawing 3/4.  Mild difficulty with copying intersecting pentagons. Cranial Nerves: Fundoscopic exam reveals sharp disc  margins. Pupils equal, briskly reactive to light. Extraocular movements full without nystagmus. Visual fields full to confrontation. Hearing slightly diminished bilaterally. Facial sensation intact. Face, tongue, palate moves normally and symmetrically.  Motor: Normal bulk and tone. Normal strength in all tested extremity muscles. Sensory.: intact to touch , pinprick , position and vibratory sensation.  Coordination: Rapid alternating movements normal in all extremities. Finger-to-nose and heel-to-shin performed accurately bilaterally. Gait and Station: Arises from chair without difficulty. Stance is normal. Gait demonstrates normal stride length and balance . Able to heel, toe and tandem walk without difficulty.  Reflexes: 1+ and symmetric. Toes downgoing.   NIHSS 0 Modified Rankin  2  ASSESSMENT:  75 year old Caucasian lady with dizziness which is likely multifactorial due to combination of lacunar stroke, alcohol abuse, peripheral neuropathy and mild cognitive impairment.  Vascular risk factors of hypertension, age , left carotid stenosis smoking and heavy alcohol intake.  Memory loss due to mild cognitive impairment.     PLAN: I had a long discussion with the patient and her cousin regarding her complaints of gait ataxia which likely appears to be multifactorial due to combination of small lacunar stroke as well as alcohol intake and possibly underlying peripheral neuropathy.  I recommend she start taking Plavix 75 mg daily for secondary stroke prevention and maintain aggressive risk factor modification with strict control of hypertension with blood pressure goal below 140/90, lipids with LDL cholesterol goal below 70 mg percent and diabetes with hemoglobin A1c goal below 6.5.  She was encouraged to eat a healthy diet and to be active regularly.  I also strongly encouraged her to quit smoking completely as well as to cut back alcohol intake to not more than 1 drink per day.  Continue ongoing home  PT/OT.Check lab work for reversible causes of memory loss, EEG, neuropathy panel labs and check EMG nerve conduction study.  Check carotid ultrasound to follow her left carotid stenosis.  We also discussed fall prevention precautions.  She will return for follow-up in the future in 2 months or call earlier if necessary.  Greater than 50-minute time during this prolonged 60-minute consultation visit was spent on counseling and coordination of care about her lacunar stroke, gait ataxia and memory loss and answering questions Antony Contras, MD  [facility] [address] [place], [place] [address]  Phone [phone] Fax [phone] Note: This document was prepared with digital dictation and possible smart phrase technology. Any transcriptional errors that result from this process are unintentional.

## 2020-07-23 NOTE — Telephone Encounter (Signed)
Pt did not wish to schedule her EEG as she states she just had one done in the hospital. Would like a call from the nurse to discuss, please advise.

## 2020-07-23 NOTE — Telephone Encounter (Signed)
Left message for patient regarding EEG.  Patient has not had one done in the hospital, Dr. Leonie Man did check again.  She will need to have one completed.  Left office number for patient to return call.

## 2020-07-25 LAB — NEUROPATHY PANEL
A/G Ratio: 1.3 (ref 0.7–1.7)
Albumin ELP: 4 g/dL (ref 2.9–4.4)
Alpha 1: 0.2 g/dL (ref 0.0–0.4)
Alpha 2: 1 g/dL (ref 0.4–1.0)
Angio Convert Enzyme: 55 U/L (ref 14–82)
Anti Nuclear Antibody (ANA): NEGATIVE
Beta: 1 g/dL (ref 0.7–1.3)
Gamma Globulin: 0.9 g/dL (ref 0.4–1.8)
Globulin, Total: 3.2 g/dL (ref 2.2–3.9)
Rheumatoid fact SerPl-aCnc: 13.5 IU/mL (ref <14.0)
Sed Rate: 8 mm/hr (ref 0–40)
TSH: 0.801 u[IU]/mL (ref 0.450–4.500)
Total Protein: 7.2 g/dL (ref 6.0–8.5)
Vit D, 25-Hydroxy: 36.4 ng/mL (ref 30.0–100.0)
Vitamin B-12: 505 pg/mL (ref 232–1245)

## 2020-07-25 LAB — HEMOGLOBIN A1C
Est. average glucose Bld gHb Est-mCnc: 105 mg/dL
Hgb A1c MFr Bld: 5.3 % (ref 4.8–5.6)

## 2020-07-25 LAB — DEMENTIA PANEL
Homocysteine: 13 umol/L (ref 0.0–19.2)
RPR Ser Ql: NONREACTIVE

## 2020-07-28 NOTE — Progress Notes (Signed)
Kindly inform the patient that all lab work for reversible causes of neuropathy as well as memory loss, vitamin D level, test for lupus, thyroid hormone, sarcoidosis was all normal and screening test for diabetes was satisfactory.

## 2020-07-30 ENCOUNTER — Telehealth: Payer: Self-pay | Admitting: *Deleted

## 2020-07-30 NOTE — Telephone Encounter (Signed)
Called pt and LVM (ok per DPR) informing her of lab results as noted in detail below by Dr Leonie Man. I left the office number for pt to call back if she has any questions and I also left reminder of EMG next Thursday 2/24 @ 9:15 am arrival 8:45.   Garvin Fila, MD  07/28/2020 4:12 PM EST Back to Top     Kindly inform the patient that all lab work for reversible causes of neuropathy as well as memory loss, vitamin D level, test for lupus, thyroid hormone, sarcoidosis was all normal and screening test for diabetes was satisfactory.

## 2020-08-04 ENCOUNTER — Ambulatory Visit (HOSPITAL_COMMUNITY): Payer: Medicare Other

## 2020-08-06 ENCOUNTER — Encounter: Payer: Medicare Other | Admitting: Neurology

## 2020-08-10 ENCOUNTER — Ambulatory Visit (HOSPITAL_COMMUNITY)
Admission: RE | Admit: 2020-08-10 | Discharge: 2020-08-10 | Disposition: A | Discharge status: Home / Self Care | Payer: Medicare Other | Source: Ambulatory Visit | Attending: Neurology | Admitting: Neurology

## 2020-08-10 DIAGNOSIS — R270 Ataxia, unspecified: Secondary | ICD-10-CM

## 2020-08-10 DIAGNOSIS — R269 Unspecified abnormalities of gait and mobility: Secondary | ICD-10-CM

## 2020-08-10 DIAGNOSIS — I6930 Unspecified sequelae of cerebral infarction: Secondary | ICD-10-CM

## 2020-08-12 ENCOUNTER — Other Ambulatory Visit: Payer: Self-pay

## 2020-08-12 ENCOUNTER — Ambulatory Visit (HOSPITAL_COMMUNITY)
Admission: RE | Admit: 2020-08-12 | Discharge: 2020-08-12 | Disposition: A | Discharge status: Home / Self Care | Payer: Medicare Other | Source: Ambulatory Visit | Attending: Neurology | Admitting: Neurology

## 2020-08-12 DIAGNOSIS — R270 Ataxia, unspecified: Secondary | ICD-10-CM | POA: Insufficient documentation

## 2020-08-12 DIAGNOSIS — I6930 Unspecified sequelae of cerebral infarction: Secondary | ICD-10-CM | POA: Insufficient documentation

## 2020-08-12 DIAGNOSIS — R269 Unspecified abnormalities of gait and mobility: Secondary | ICD-10-CM | POA: Diagnosis not present

## 2020-08-12 NOTE — Progress Notes (Signed)
Carotid artery duplex has been completed. Preliminary results can be found in CV Proc through chart review.   08/12/20 2:19 PM Crystal Noble RVT

## 2020-08-17 ENCOUNTER — Other Ambulatory Visit: Payer: Self-pay

## 2020-08-17 ENCOUNTER — Ambulatory Visit (INDEPENDENT_AMBULATORY_CARE_PROVIDER_SITE_OTHER): Payer: Medicare Other | Admitting: Neurology

## 2020-08-17 ENCOUNTER — Telehealth: Payer: Self-pay | Admitting: *Deleted

## 2020-08-17 DIAGNOSIS — R202 Paresthesia of skin: Secondary | ICD-10-CM

## 2020-08-17 DIAGNOSIS — R200 Anesthesia of skin: Secondary | ICD-10-CM | POA: Diagnosis not present

## 2020-08-17 DIAGNOSIS — R269 Unspecified abnormalities of gait and mobility: Secondary | ICD-10-CM

## 2020-08-17 DIAGNOSIS — Z0289 Encounter for other administrative examinations: Secondary | ICD-10-CM

## 2020-08-17 DIAGNOSIS — G629 Polyneuropathy, unspecified: Secondary | ICD-10-CM

## 2020-08-17 NOTE — Progress Notes (Signed)
See procedure note.

## 2020-08-17 NOTE — Progress Notes (Signed)
       Full Name: Crystal Noble Gender: Female MRN #: [id number] Date of Birth: 15-Oct-1945    Visit Date: 08/17/2020 12:26 Age: 75 Years Examining Physician: Sarina Ill, MD  Referring Physician: Antony Contras, MD    History: Patient with ataxia with numbness and tingling in the feet Summary: EMG/NCS performed on the right lower extremity. All nerves (as indicated in the following tables) were within normal limits. The right Tibialis anterior,right Abductor Hallucis and right Extensor Hallucis muscles showed reduced insertional activity due to atrophy; due to the extent of atrophy, motor units were not able to be elicited. All remaining muscles (as indicated in the following tables) were within normal limits.     Conclusion: Nerve conduction studies were within normal limits. Muscles showed reduced insertional activity likely due to atrophy.  Sarina Ill M.D.  [facility] [address], [place], [place] [address] Tel: [phone] Fax: [phone]  Verbal informed consent was obtained from the patient, patient was informed of potential risk of procedure, including bruising, bleeding, hematoma formation, infection, muscle weakness, muscle pain, numbness, among others.        [place]    Nerve / Sites Muscle Latency Ref. Amplitude Ref. Rel Amp Segments Distance Velocity Ref. Area    ms ms mV mV %  cm m/s m/s mVms  R Peroneal - EDB     Ankle EDB 4.5 ?6.5 5.1 ?2.0 100 Ankle - EDB 9   13.7     Fib head EDB 8.7  4.6  90.9 Fib head - Ankle 22 53 ?44 12.8     Pop fossa EDB 10.6  5.0  108 Pop fossa - Fib head 10 52 ?44 12.9         Pop fossa - Ankle      R Tibial - AH     Ankle AH 4.2 ?5.8 5.9 ?4.0 100 Ankle - AH 9   12.9     Pop fossa AH 11.8  4.6  78.3 Pop fossa - Ankle 33 44 ?41 11.3         SNC    Nerve / Sites Rec. Site Peak Lat Ref.  Amp Ref. Segments Distance    ms ms V V  cm  R Sural - Ankle (Calf)     Calf Ankle 3.5 ?4.4 6 ?6 Calf - Ankle 14  R  Superficial peroneal - Ankle     Lat leg Ankle 3.5 ?4.4 7 ?6 Lat leg - Ankle 14         F  Wave    Nerve F Lat Ref.   ms ms  R Tibial - AH 45.3 ?56.0       EMG Summary Table    Spontaneous MUAP Recruitment  Muscle IA Fib PSW Fasc Other Amp Dur. Poly Pattern  R. Vastus medialis Decreased None None None _______ Normal Normal Normal Normal  R. Gastrocnemius (Medial head) Decreased None None None _______ Normal Normal Normal Normal  R. Tibialis anterior Decreased None None None _______      R. Extensor hallucis longus Decreased None None None _______      R. Abductor hallucis Decreased None None None _______

## 2020-08-17 NOTE — Telephone Encounter (Signed)
LVM informing  the patient that carotid ultrasound study showed no significant stenosis on either side in the neck, good results. Left # for questions.

## 2020-08-17 NOTE — Procedures (Signed)
       Full Name: Crystal Noble Gender: Female MRN #: [id number] Date of Birth: May 24, 1946    Visit Date: 08/17/2020 12:26 Age: 75 Years Examining Physician: Sarina Ill, MD  Referring Physician: Antony Contras, MD    History: Patient with ataxia with numbness and tingling in the feet Summary: EMG/NCS performed on the right lower extremity. All nerves (as indicated in the following tables) were within normal limits. The right Tibialis anterior,right Abductor Hallucis and right Extensor Hallucis muscles showed reduced insertional activity due to atrophy; due to the extent of atrophy, motor units were not able to be elicited. All remaining muscles (as indicated in the following tables) were within normal limits.     Conclusion: Nerve conduction studies were within normal limits. Muscles showed reduced insertional activity likely due to atrophy.  Sarina Ill M.D.  [facility] [address] [place], [place] [address] Tel: [phone] Fax: [phone]  Verbal informed consent was obtained from the patient, patient was informed of potential risk of procedure, including bruising, bleeding, hematoma formation, infection, muscle weakness, muscle pain, numbness, among others.        [place]    Nerve / Sites Muscle Latency Ref. Amplitude Ref. Rel Amp Segments Distance Velocity Ref. Area    ms ms mV mV %  cm m/s m/s mVms  R Peroneal - EDB     Ankle EDB 4.5 ?6.5 5.1 ?2.0 100 Ankle - EDB 9   13.7     Fib head EDB 8.7  4.6  90.9 Fib head - Ankle 22 53 ?44 12.8     Pop fossa EDB 10.6  5.0  108 Pop fossa - Fib head 10 52 ?44 12.9         Pop fossa - Ankle      R Tibial - AH     Ankle AH 4.2 ?5.8 5.9 ?4.0 100 Ankle - AH 9   12.9     Pop fossa AH 11.8  4.6  78.3 Pop fossa - Ankle 33 44 ?41 11.3         SNC    Nerve / Sites Rec. Site Peak Lat Ref.  Amp Ref. Segments Distance    ms ms V V  cm  R Sural - Ankle (Calf)     Calf Ankle 3.5 ?4.4 6 ?6 Calf - Ankle 14  R  Superficial peroneal - Ankle     Lat leg Ankle 3.5 ?4.4 7 ?6 Lat leg - Ankle 14         F  Wave    Nerve F Lat Ref.   ms ms  R Tibial - AH 45.3 ?56.0       EMG Summary Table    Spontaneous MUAP Recruitment  Muscle IA Fib PSW Fasc Other Amp Dur. Poly Pattern  R. Vastus medialis Decreased None None None _______ Normal Normal Normal Normal  R. Gastrocnemius (Medial head) Decreased None None None _______ Normal Normal Normal Normal  R. Tibialis anterior Decreased None None None _______      R. Extensor hallucis longus Decreased None None None _______      R. Abductor hallucis Decreased None None None _______

## 2020-08-18 ENCOUNTER — Telehealth: Payer: Self-pay | Admitting: Emergency Medicine

## 2020-08-18 ENCOUNTER — Telehealth: Payer: Self-pay | Admitting: *Deleted

## 2020-08-18 NOTE — Telephone Encounter (Signed)
-----   Message from Garvin Fila, MD sent at 08/18/2020  8:45 AM EST -----   ----- Message ----- From: Melvenia Beam, MD Sent: 08/17/2020   5:36 PM EST To: Garvin Fila, MD

## 2020-08-18 NOTE — Telephone Encounter (Signed)
Spoke with patient and informed her that nerve conduction study was normal without evidence of neuropathy. Patient verbalized understanding, appreciation.

## 2020-08-18 NOTE — Progress Notes (Signed)
Kindly inform the patient that nerve conduction study was normal without evidence of neuropathy or nerve damage.

## 2020-08-18 NOTE — Telephone Encounter (Signed)
Called patient and discused Dr. Clydene Fake reviews and findings of patient's EMG nerve conduction study.  Patient denied further questions, verbalized understanding and expressed appreciation for the phone call.

## 2020-08-18 NOTE — Progress Notes (Signed)
Kindly inform the patient that EMG nerve conduction study was normal

## 2020-11-29 ENCOUNTER — Emergency Department (HOSPITAL_COMMUNITY): Payer: Medicare Other

## 2020-11-29 ENCOUNTER — Inpatient Hospital Stay (HOSPITAL_COMMUNITY): Payer: Medicare Other

## 2020-11-29 ENCOUNTER — Encounter (HOSPITAL_COMMUNITY): Payer: Self-pay | Admitting: Emergency Medicine

## 2020-11-29 ENCOUNTER — Inpatient Hospital Stay (HOSPITAL_COMMUNITY)
Admission: EM | Admit: 2020-11-29 | Discharge: 2020-12-03 | DRG: 535 | Disposition: A | Discharge status: Skilled Nursing Facility | Payer: Medicare Other | Attending: Student in an Organized Health Care Education/Training Program | Admitting: Student in an Organized Health Care Education/Training Program

## 2020-11-29 ENCOUNTER — Other Ambulatory Visit: Payer: Self-pay

## 2020-11-29 DIAGNOSIS — I69398 Other sequelae of cerebral infarction: Secondary | ICD-10-CM | POA: Diagnosis not present

## 2020-11-29 DIAGNOSIS — F419 Anxiety disorder, unspecified: Secondary | ICD-10-CM | POA: Diagnosis present

## 2020-11-29 DIAGNOSIS — R296 Repeated falls: Secondary | ICD-10-CM | POA: Diagnosis present

## 2020-11-29 DIAGNOSIS — K760 Fatty (change of) liver, not elsewhere classified: Secondary | ICD-10-CM | POA: Diagnosis not present

## 2020-11-29 DIAGNOSIS — Z8249 Family history of ischemic heart disease and other diseases of the circulatory system: Secondary | ICD-10-CM

## 2020-11-29 DIAGNOSIS — Z7902 Long term (current) use of antithrombotics/antiplatelets: Secondary | ICD-10-CM

## 2020-11-29 DIAGNOSIS — I2510 Atherosclerotic heart disease of native coronary artery without angina pectoris: Secondary | ICD-10-CM | POA: Diagnosis present

## 2020-11-29 DIAGNOSIS — J449 Chronic obstructive pulmonary disease, unspecified: Secondary | ICD-10-CM | POA: Diagnosis present

## 2020-11-29 DIAGNOSIS — Z20822 Contact with and (suspected) exposure to covid-19: Secondary | ICD-10-CM | POA: Diagnosis not present

## 2020-11-29 DIAGNOSIS — Z681 Body mass index (BMI) 19 or less, adult: Secondary | ICD-10-CM | POA: Diagnosis not present

## 2020-11-29 DIAGNOSIS — W19XXXA Unspecified fall, initial encounter: Secondary | ICD-10-CM | POA: Diagnosis present

## 2020-11-29 DIAGNOSIS — Z9181 History of falling: Secondary | ICD-10-CM | POA: Diagnosis not present

## 2020-11-29 DIAGNOSIS — R2681 Unsteadiness on feet: Secondary | ICD-10-CM | POA: Diagnosis not present

## 2020-11-29 DIAGNOSIS — I6529 Occlusion and stenosis of unspecified carotid artery: Secondary | ICD-10-CM | POA: Diagnosis not present

## 2020-11-29 DIAGNOSIS — S32401A Unspecified fracture of right acetabulum, initial encounter for closed fracture: Secondary | ICD-10-CM | POA: Diagnosis not present

## 2020-11-29 DIAGNOSIS — E785 Hyperlipidemia, unspecified: Secondary | ICD-10-CM | POA: Diagnosis present

## 2020-11-29 DIAGNOSIS — Z7951 Long term (current) use of inhaled steroids: Secondary | ICD-10-CM

## 2020-11-29 DIAGNOSIS — F17210 Nicotine dependence, cigarettes, uncomplicated: Secondary | ICD-10-CM | POA: Diagnosis present

## 2020-11-29 DIAGNOSIS — S32434D Nondisplaced fracture of anterior column [iliopubic] of right acetabulum, subsequent encounter for fracture with routine healing: Secondary | ICD-10-CM | POA: Diagnosis not present

## 2020-11-29 DIAGNOSIS — S32434A Nondisplaced fracture of anterior column [iliopubic] of right acetabulum, initial encounter for closed fracture: Secondary | ICD-10-CM | POA: Diagnosis not present

## 2020-11-29 DIAGNOSIS — E43 Unspecified severe protein-calorie malnutrition: Secondary | ICD-10-CM | POA: Diagnosis present

## 2020-11-29 DIAGNOSIS — E782 Mixed hyperlipidemia: Secondary | ICD-10-CM | POA: Diagnosis present

## 2020-11-29 DIAGNOSIS — Z83438 Family history of other disorder of lipoprotein metabolism and other lipidemia: Secondary | ICD-10-CM | POA: Diagnosis not present

## 2020-11-29 DIAGNOSIS — I739 Peripheral vascular disease, unspecified: Secondary | ICD-10-CM | POA: Diagnosis present

## 2020-11-29 DIAGNOSIS — Z79818 Long term (current) use of other agents affecting estrogen receptors and estrogen levels: Secondary | ICD-10-CM

## 2020-11-29 DIAGNOSIS — Z8673 Personal history of transient ischemic attack (TIA), and cerebral infarction without residual deficits: Secondary | ICD-10-CM | POA: Diagnosis not present

## 2020-11-29 DIAGNOSIS — K529 Noninfective gastroenteritis and colitis, unspecified: Secondary | ICD-10-CM | POA: Diagnosis not present

## 2020-11-29 DIAGNOSIS — Z7401 Bed confinement status: Secondary | ICD-10-CM | POA: Diagnosis not present

## 2020-11-29 DIAGNOSIS — M6281 Muscle weakness (generalized): Secondary | ICD-10-CM | POA: Diagnosis not present

## 2020-11-29 DIAGNOSIS — Z7982 Long term (current) use of aspirin: Secondary | ICD-10-CM | POA: Diagnosis not present

## 2020-11-29 DIAGNOSIS — G319 Degenerative disease of nervous system, unspecified: Secondary | ICD-10-CM | POA: Diagnosis not present

## 2020-11-29 DIAGNOSIS — I700 Atherosclerosis of aorta: Secondary | ICD-10-CM | POA: Diagnosis present

## 2020-11-29 DIAGNOSIS — Z79899 Other long term (current) drug therapy: Secondary | ICD-10-CM

## 2020-11-29 DIAGNOSIS — I10 Essential (primary) hypertension: Secondary | ICD-10-CM | POA: Diagnosis present

## 2020-11-29 DIAGNOSIS — R413 Other amnesia: Secondary | ICD-10-CM | POA: Diagnosis not present

## 2020-11-29 DIAGNOSIS — Z880 Allergy status to penicillin: Secondary | ICD-10-CM

## 2020-11-29 DIAGNOSIS — S0990XA Unspecified injury of head, initial encounter: Secondary | ICD-10-CM | POA: Diagnosis not present

## 2020-11-29 DIAGNOSIS — S32414A Nondisplaced fracture of anterior wall of right acetabulum, initial encounter for closed fracture: Secondary | ICD-10-CM

## 2020-11-29 DIAGNOSIS — M25451 Effusion, right hip: Secondary | ICD-10-CM | POA: Diagnosis not present

## 2020-11-29 DIAGNOSIS — R64 Cachexia: Secondary | ICD-10-CM | POA: Diagnosis not present

## 2020-11-29 DIAGNOSIS — I6782 Cerebral ischemia: Secondary | ICD-10-CM | POA: Diagnosis not present

## 2020-11-29 DIAGNOSIS — I69828 Other speech and language deficits following other cerebrovascular disease: Secondary | ICD-10-CM | POA: Diagnosis not present

## 2020-11-29 DIAGNOSIS — I959 Hypotension, unspecified: Secondary | ICD-10-CM | POA: Diagnosis not present

## 2020-11-29 DIAGNOSIS — R531 Weakness: Secondary | ICD-10-CM | POA: Diagnosis not present

## 2020-11-29 DIAGNOSIS — R41841 Cognitive communication deficit: Secondary | ICD-10-CM | POA: Diagnosis not present

## 2020-11-29 DIAGNOSIS — G3184 Mild cognitive impairment, so stated: Secondary | ICD-10-CM | POA: Diagnosis present

## 2020-11-29 DIAGNOSIS — Z043 Encounter for examination and observation following other accident: Secondary | ICD-10-CM | POA: Diagnosis not present

## 2020-11-29 LAB — RESP PANEL BY RT-PCR (FLU A&B, COVID) ARPGX2
Influenza A by PCR: NEGATIVE
Influenza B by PCR: NEGATIVE
SARS Coronavirus 2 by RT PCR: NEGATIVE

## 2020-11-29 LAB — COMPREHENSIVE METABOLIC PANEL
ALT: 24 U/L (ref 0–44)
AST: 30 U/L (ref 15–41)
Albumin: 4.3 g/dL (ref 3.5–5.0)
Alkaline Phosphatase: 62 U/L (ref 38–126)
Anion gap: 17 — ABNORMAL HIGH (ref 5–15)
BUN: 22 mg/dL (ref 8–23)
CO2: 19 mmol/L — ABNORMAL LOW (ref 22–32)
Calcium: 9.8 mg/dL (ref 8.9–10.3)
Chloride: 101 mmol/L (ref 98–111)
Creatinine, Ser: 1.06 mg/dL — ABNORMAL HIGH (ref 0.44–1.00)
GFR, Estimated: 55 mL/min — ABNORMAL LOW (ref >60)
Glucose, Bld: 87 mg/dL (ref 70–99)
Potassium: 4.3 mmol/L (ref 3.5–5.1)
Sodium: 137 mmol/L (ref 135–145)
Total Bilirubin: 2.3 mg/dL — ABNORMAL HIGH (ref 0.3–1.2)
Total Protein: 7.4 g/dL (ref 6.5–8.1)

## 2020-11-29 LAB — CBC WITH DIFFERENTIAL/PLATELET
Abs Immature Granulocytes: 0.04 10*3/uL (ref 0.00–0.07)
Basophils Absolute: 0 10*3/uL (ref 0.0–0.1)
Basophils Relative: 0 %
Eosinophils Absolute: 0 10*3/uL (ref 0.0–0.5)
Eosinophils Relative: 0 %
HCT: 41.9 % (ref 36.0–46.0)
Hemoglobin: 13.8 g/dL (ref 12.0–15.0)
Immature Granulocytes: 1 %
Lymphocytes Relative: 8 %
Lymphs Abs: 0.7 10*3/uL (ref 0.7–4.0)
MCH: 33.9 pg (ref 26.0–34.0)
MCHC: 32.9 g/dL (ref 30.0–36.0)
MCV: 102.9 fL — ABNORMAL HIGH (ref 80.0–100.0)
Monocytes Absolute: 0.8 10*3/uL (ref 0.1–1.0)
Monocytes Relative: 9 %
Neutro Abs: 7.2 10*3/uL (ref 1.7–7.7)
Neutrophils Relative %: 82 %
Platelets: 200 10*3/uL (ref 150–400)
RBC: 4.07 MIL/uL (ref 3.87–5.11)
RDW: 13.8 % (ref 11.5–15.5)
WBC: 8.7 10*3/uL (ref 4.0–10.5)
nRBC: 0 % (ref 0.0–0.2)

## 2020-11-29 LAB — PROTIME-INR
INR: 1 (ref 0.8–1.2)
Prothrombin Time: 12.9 seconds (ref 11.4–15.2)

## 2020-11-29 LAB — ETHANOL: Alcohol, Ethyl (B): <10 mg/dL (ref <10)

## 2020-11-29 LAB — TROPONIN I (HIGH SENSITIVITY)
Troponin I (High Sensitivity): 9 ng/L (ref <18)
Troponin I (High Sensitivity): 14 ng/L (ref <18)

## 2020-11-29 IMAGING — MR MR HEAD W/O CM
10 of 11 series · 42 of 48 positions shown · non-contrast
Comparison: [DATE]

CLINICAL DATA: Memory loss and fall

EXAM:
MRI HEAD WITHOUT CONTRAST
TECHNIQUE: Multiplanar, multiecho pulse sequences of the brain and surrounding
structures were obtained without intravenous contrast.

[Series 5: DWI · axial · 3.0mm · 0.88mm/px · z∈[-100,+37]mm · 9 of 96 slices shown (1 of 4)]
[im 1/96]
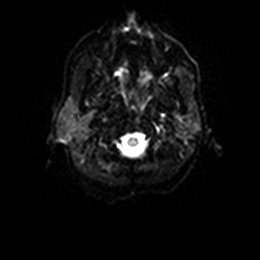
[im 12/96]
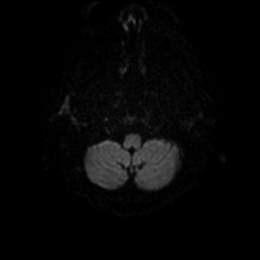
[im 24/96]
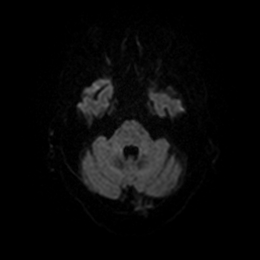
[im 36/96]
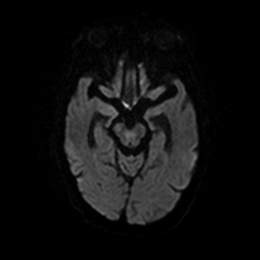
[im 48/96]
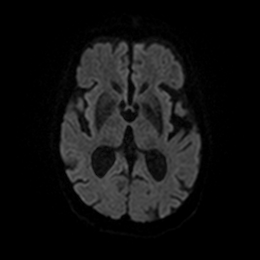
[im 60/96]
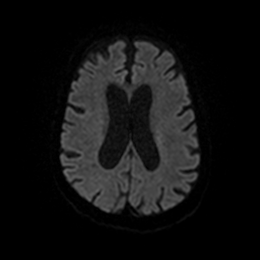
[im 72/96]
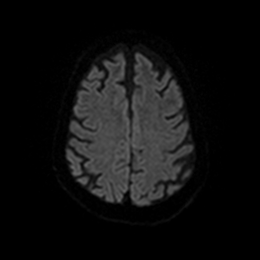
[im 84/96]
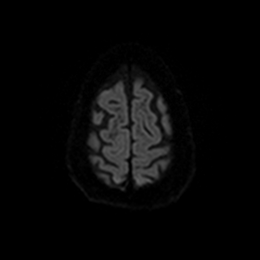
[im 96/96]
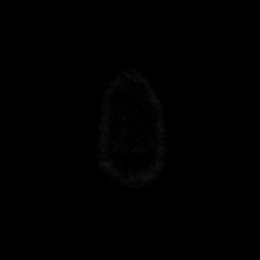

[Series 6: DWI · axial · 3.0mm · 0.88mm/px · z∈[-100,+37]mm · 4 of 48 slices shown (2 of 4)]
[im 1/48]
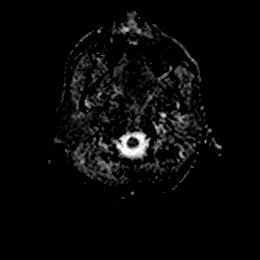
[im 16/48]
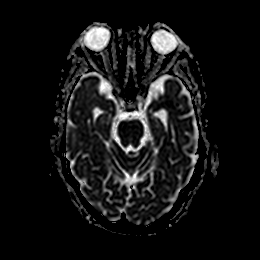
[im 32/48]
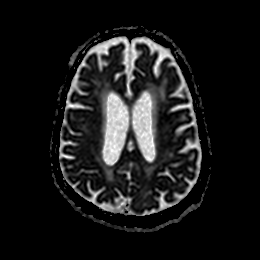
[im 48/48]
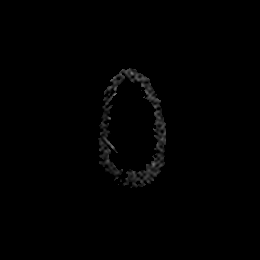

[Series 7: DWI · coronal · 4.0mm · 0.88mm/px · 6 of 64 slices shown (3 of 4)]
[im 1/64]
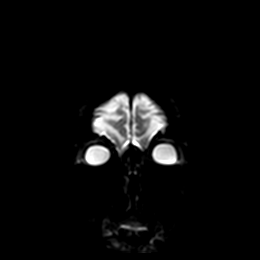
[im 13/64]
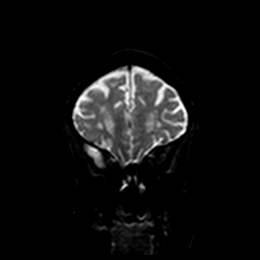
[im 26/64]
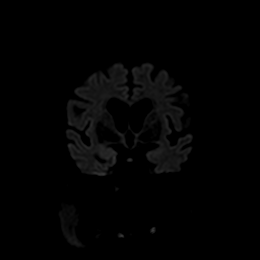
[im 38/64]
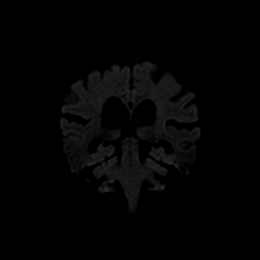
[im 51/64]
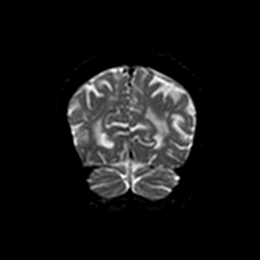
[im 64/64]
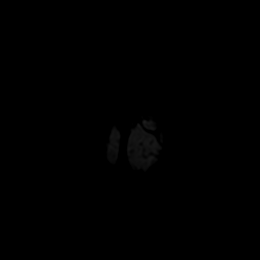

[Series 8: DWI · coronal · 4.0mm · 0.88mm/px · 3 of 32 slices shown (4 of 4)]
[im 1/32]
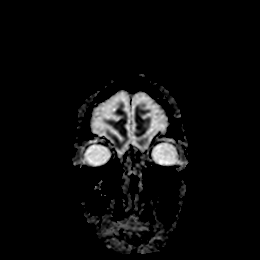
[im 16/32]
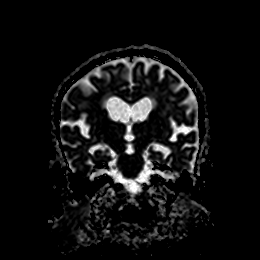
[im 32/32]
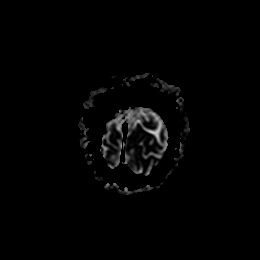

[Series 9: T1 · sagittal · 5.0mm · 0.75mm/px · 2 of 23 slices shown]
[im 1/23]
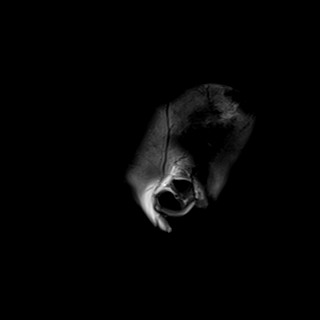
[im 23/23]
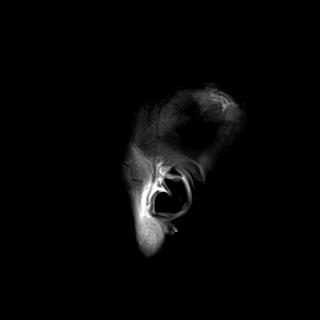

[Series 10: T2 · axial · 5.0mm · 0.72mm/px · z∈[-101,+38]mm · 2 of 25 slices shown (1 of 2)]
[im 1/25]
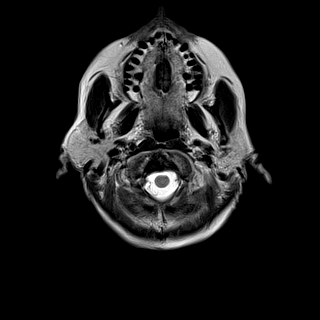
[im 25/25]
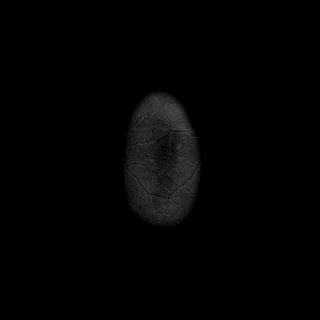

[Series 11: FLAIR · axial · 5.0mm · 0.45mm/px · z∈[-104,+36]mm · 2 of 25 slices shown]
[im 1/25]
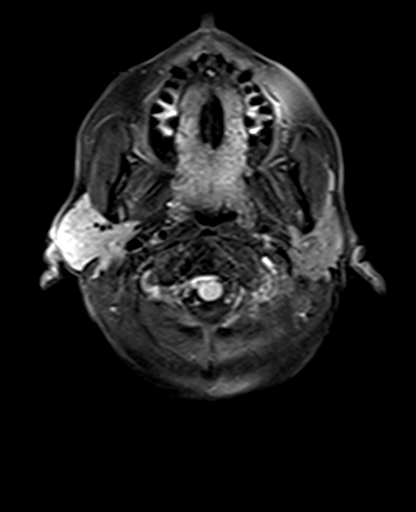
[im 25/25]
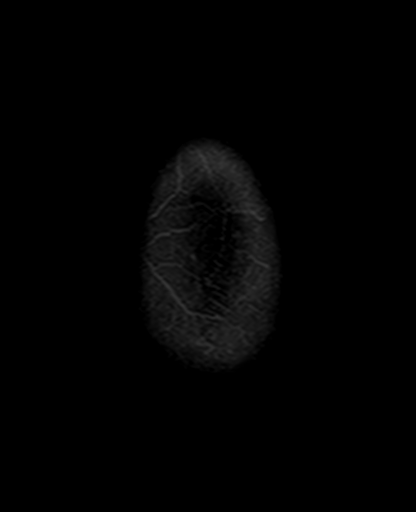

[Series 13: pha_images · axial · 3.0mm · 0.90mm/px · z∈[-120,+49]mm · 5 of 59 slices shown]
[im 1/59]
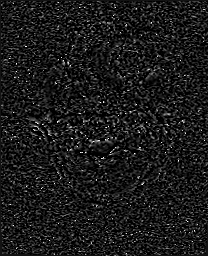
[im 15/59]
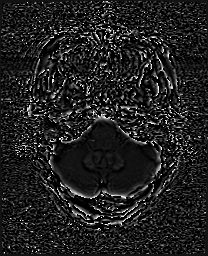
[im 30/59]
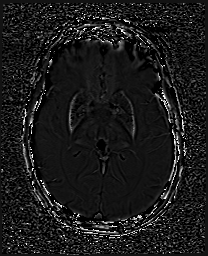
[im 44/59]
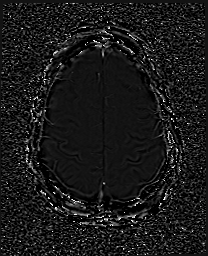
[im 59/59]
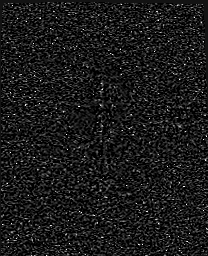

[Series 14: swi_images · axial · 3.0mm · 0.90mm/px · z∈[-120,+52]mm · 6 of 60 slices shown]
[im 1/60]
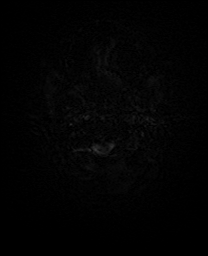
[im 12/60]
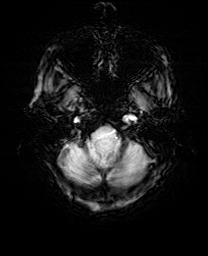
[im 24/60]
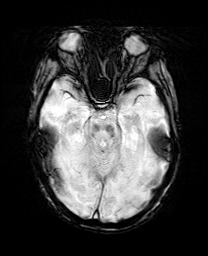
[im 36/60]
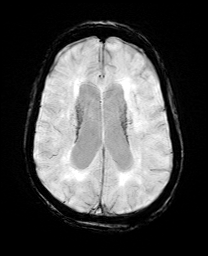
[im 48/60]
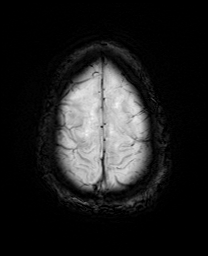
[im 60/60]
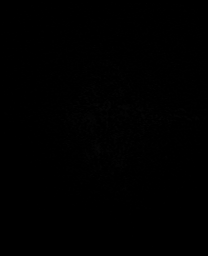

[Series 17: T2 · coronal · 5.0mm · 0.34mm/px · 3 of 29 slices shown (2 of 2)]
[im 1/29]
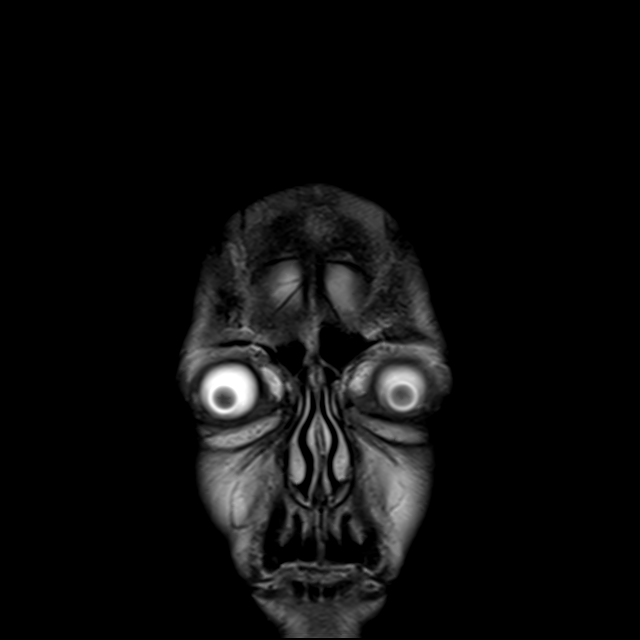
[im 15/29]
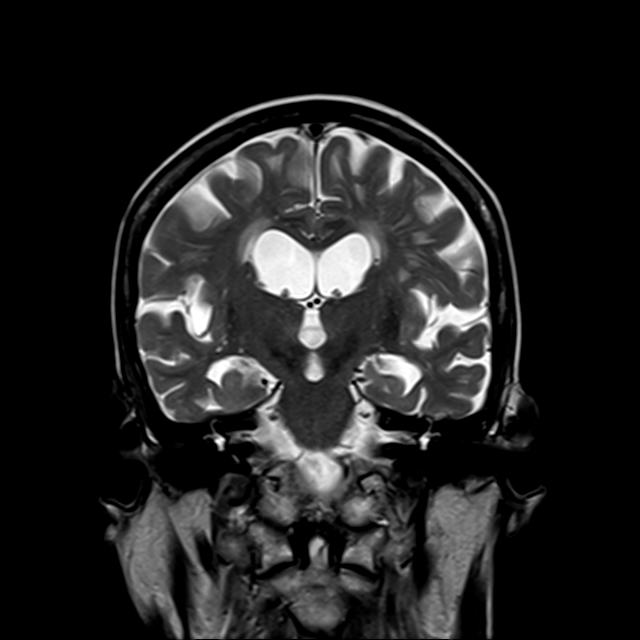
[im 29/29]
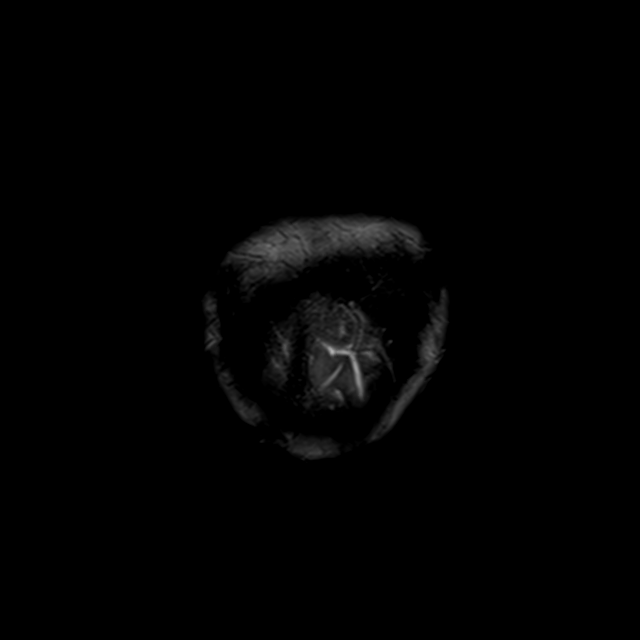

[42 of 48 positions shown; findings below may reference images not displayed]

FINDINGS: Brain: No acute infarct, mass effect or extra-axial collection.
Punctate focus of DWI hyperintensity at the posterior right basal
ganglia is unchanged since [DATE]. No acute or chronic
hemorrhage. Normal white matter signal, parenchymal volume and CSF
spaces. The midline structures are normal.

Vascular: Major flow voids are preserved.

Skull and upper cervical spine: Normal calvarium and skull base.
Visualized upper cervical spine and soft tissues are normal.

Sinuses/Orbits:No paranasal sinus fluid levels or advanced mucosal
thickening. No mastoid or middle ear effusion. Normal orbits.
IMPRESSION: 1. No acute intracranial abnormality.
2. Punctate focus of DWI hyperintensity at the posterior right basal
ganglia is unchanged since [DATE]. This may indicate a small
focus of chronic ischemia.

## 2020-11-29 IMAGING — MR MR HIP*R* W/O CM
5 series · 36 of 40 positions shown · non-contrast
Comparison: X-ray [DATE]

CLINICAL DATA: Severe right hip pain after fall

EXAM:
MR OF THE RIGHT HIP WITHOUT CONTRAST
TECHNIQUE: Multiplanar, multisequence MR imaging was performed. No intravenous
contrast was administered.

[Series 6: T1 · coronal · right · 4.0mm · 1.17mm/px · 8 of 40 slices shown]
[im 1/40]
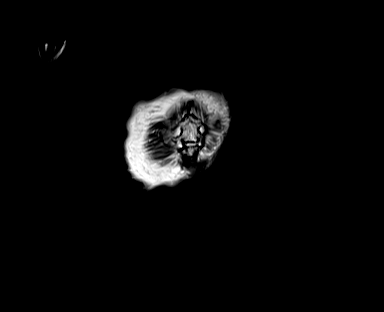
[im 5/40]
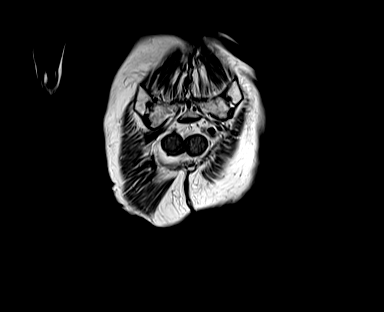
[im 14/40]
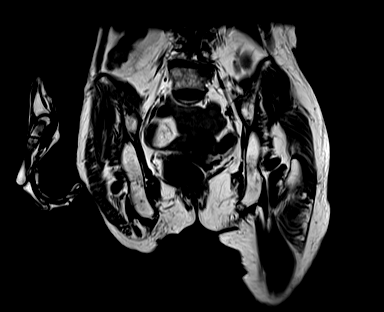
[im 18/40]
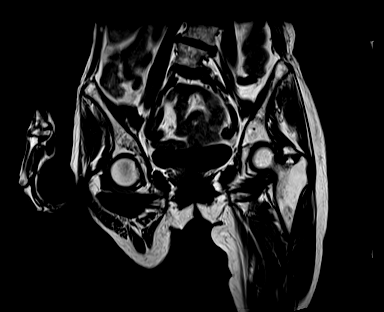
[im 22/40]
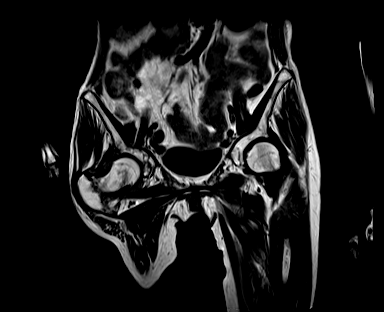
[im 27/40]
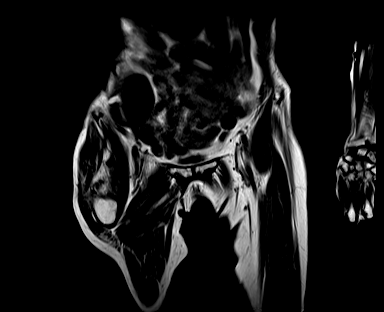
[im 35/40]
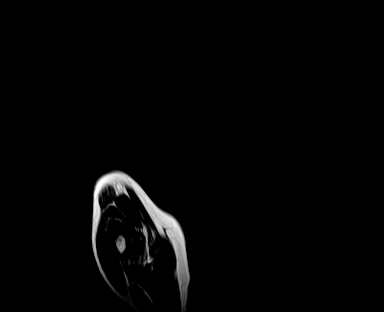
[im 40/40]
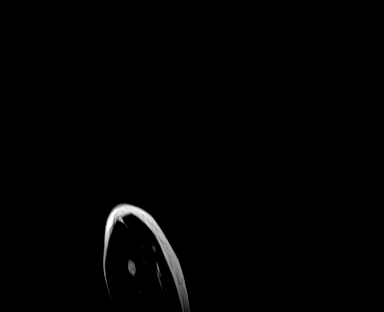

[Series 7: T2 fat-sat · coronal · right · 4.0mm · 1.00mm/px · 8 of 40 slices shown (1 of 2)]
[im 1/40]
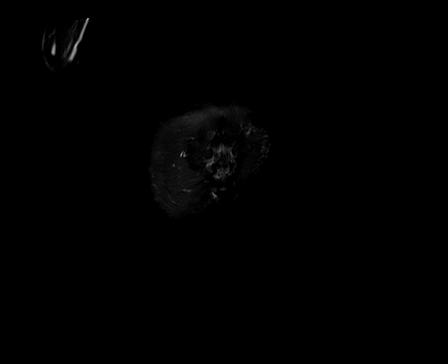
[im 5/40]
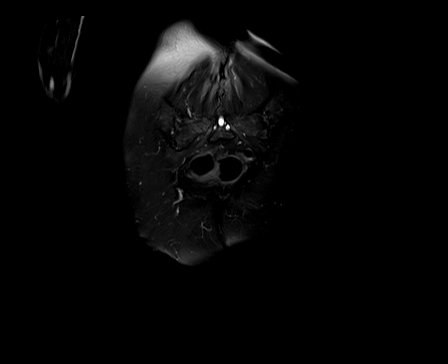
[im 14/40]
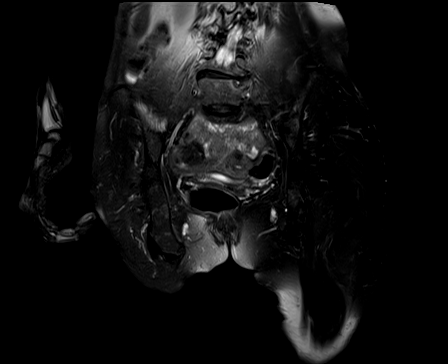
[im 18/40]
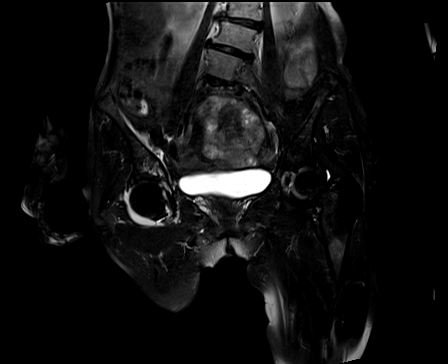
[im 22/40]
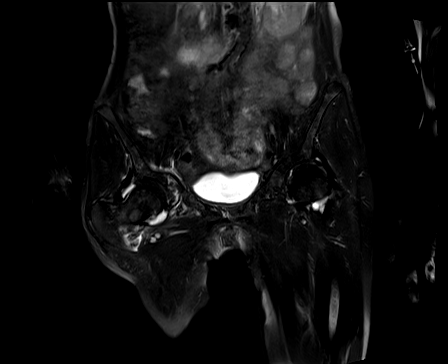
[im 27/40]
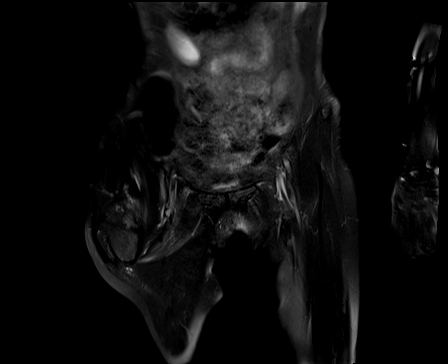
[im 35/40]
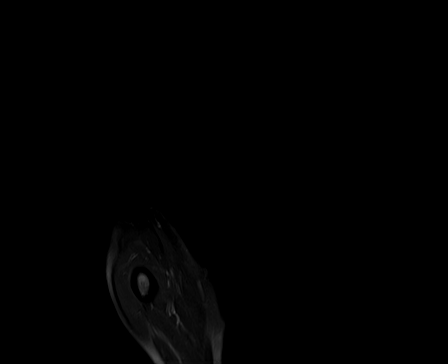
[im 40/40]
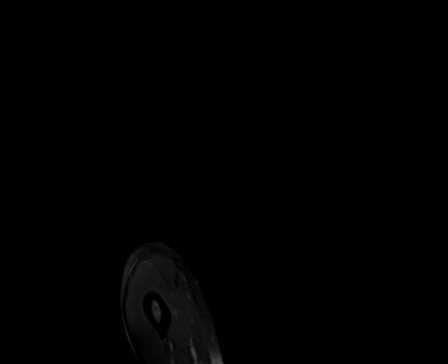

[Series 8: T2 fat-sat · axial · right · 4.0mm · 0.55mm/px · z∈[-174,+10]mm · 9 of 38 slices shown (2 of 2)]
[im 1/38]
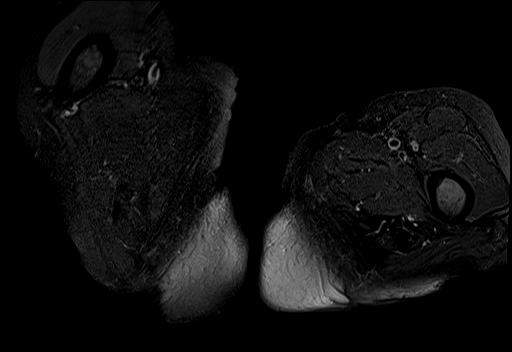
[im 5/38]
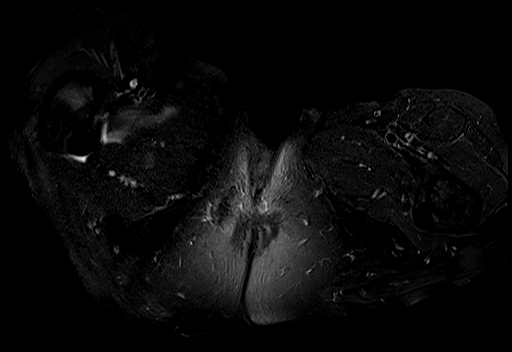
[im 10/38]
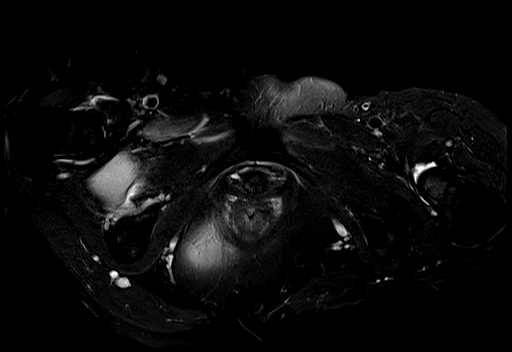
[im 14/38]
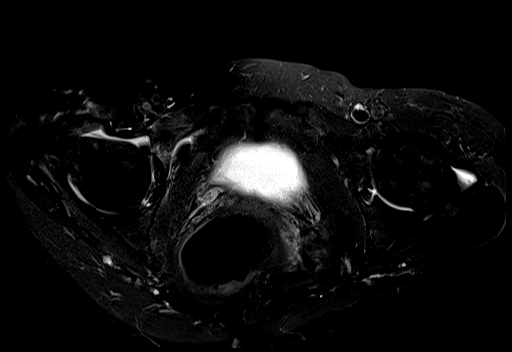
[im 19/38]
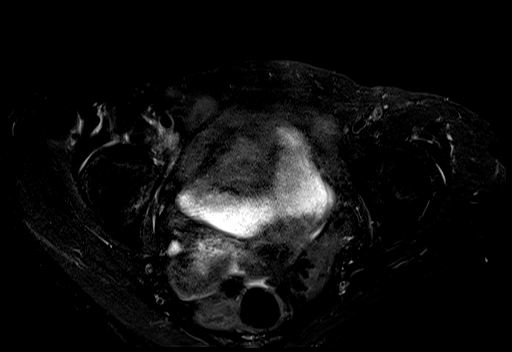
[im 24/38]
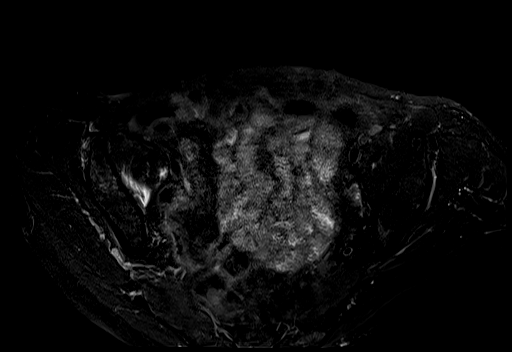
[im 28/38]
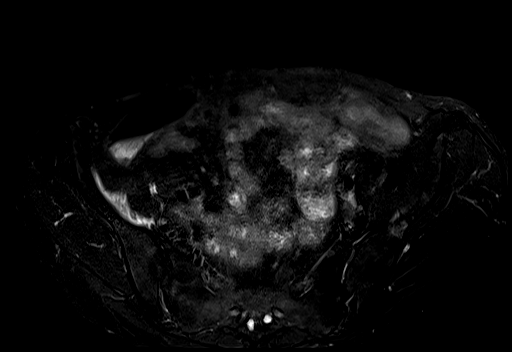
[im 33/38]
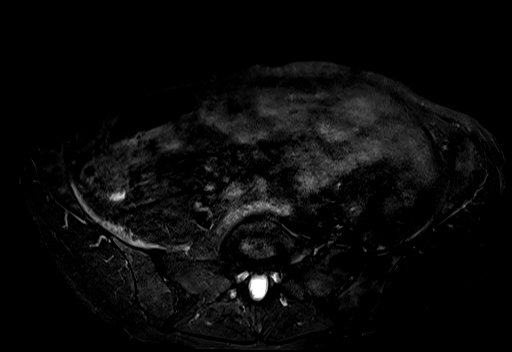
[im 38/38]
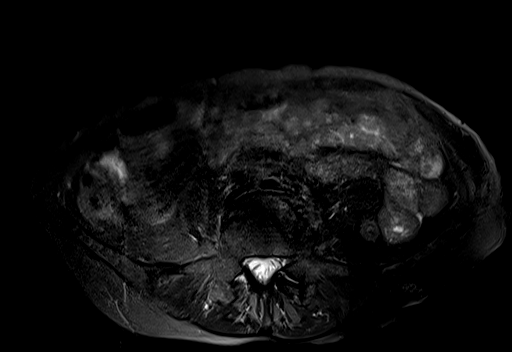

[Series 9: PD fat-sat · sagittal · right · 4.0mm · 0.62mm/px · 6 of 24 slices shown (1 of 2)]
[im 1/24]
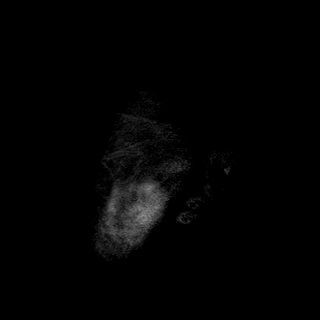
[im 5/24]
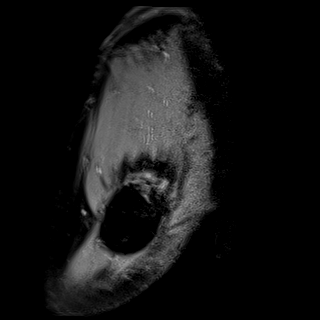
[im 10/24]
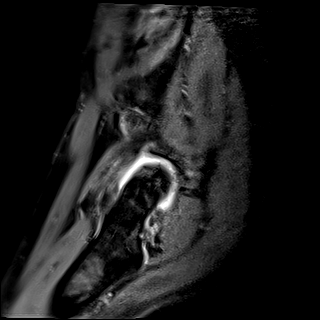
[im 14/24]
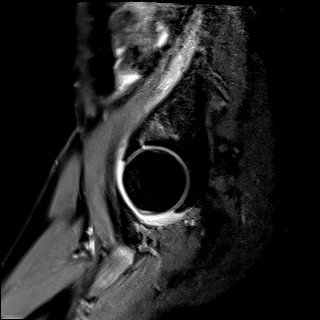
[im 19/24]
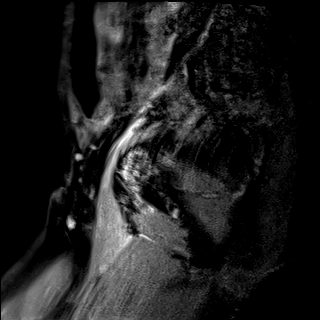
[im 24/24]
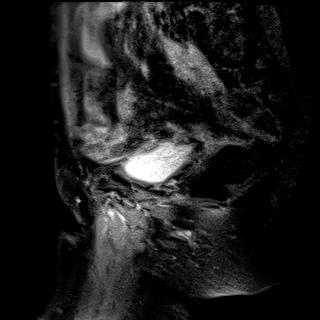

[Series 10: PD fat-sat · coronal · right · 3.0mm · 0.70mm/px · 5 of 22 slices shown (2 of 2)]
[im 1/22]
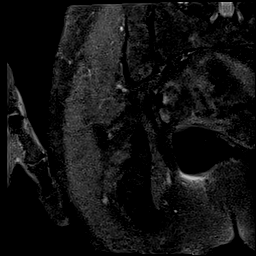
[im 6/22]
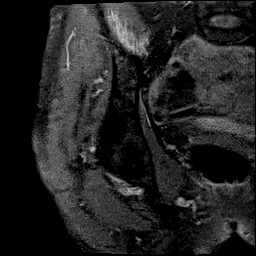
[im 11/22]
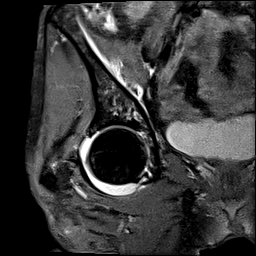
[im 16/22]
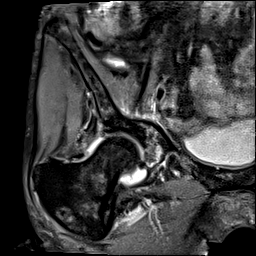
[im 22/22]
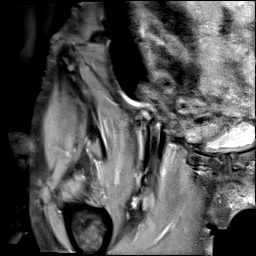

[36 of 40 positions shown; findings below may reference images not displayed]

FINDINGS: Bones/Joint/Cartilage

Acute nondisplaced fracture of the right acetabulum involving the
acetabular roof and anterior acetabulum. Associated bone marrow
edema. No articular surface depression or diastasis. The proximal
right femur is intact without fracture or dislocation. Small right
hip joint effusion. No focal cartilage defect. Remainder of the bony
pelvis is intact. No additional fracture. No pelvic diastasis. Left
hip joint intact. Degenerative disc disease within the lower lumbar
spine.

Ligaments

Partial tear of the iliofemoral ligament from its iliac attachment
(series 8, image 17).

Muscles and Tendons

Intramuscular edema about the right hip joint, most pronounced
within the adductor compartment. No acute tendinous injury.

Soft tissues

No fluid collection or hematoma. No inguinal lymphadenopathy. No
acute findings are evident within the pelvis.
IMPRESSION: 1. Acute nondisplaced fracture of the right acetabulum involving the
acetabular roof and anterior acetabulum. Small right hip joint
effusion.
2. Partial tear of the iliofemoral ligament from its iliac
attachment.

## 2020-11-29 IMAGING — CR DG HIP (WITH OR WITHOUT PELVIS) 1V*R*
3 series · 3 of 3 positions shown · non-contrast
Comparison: [DATE]

CLINICAL DATA: Fall

EXAM:
DG HIP (WITH OR WITHOUT PELVIS) 1V RIGHT

[hip x-table]
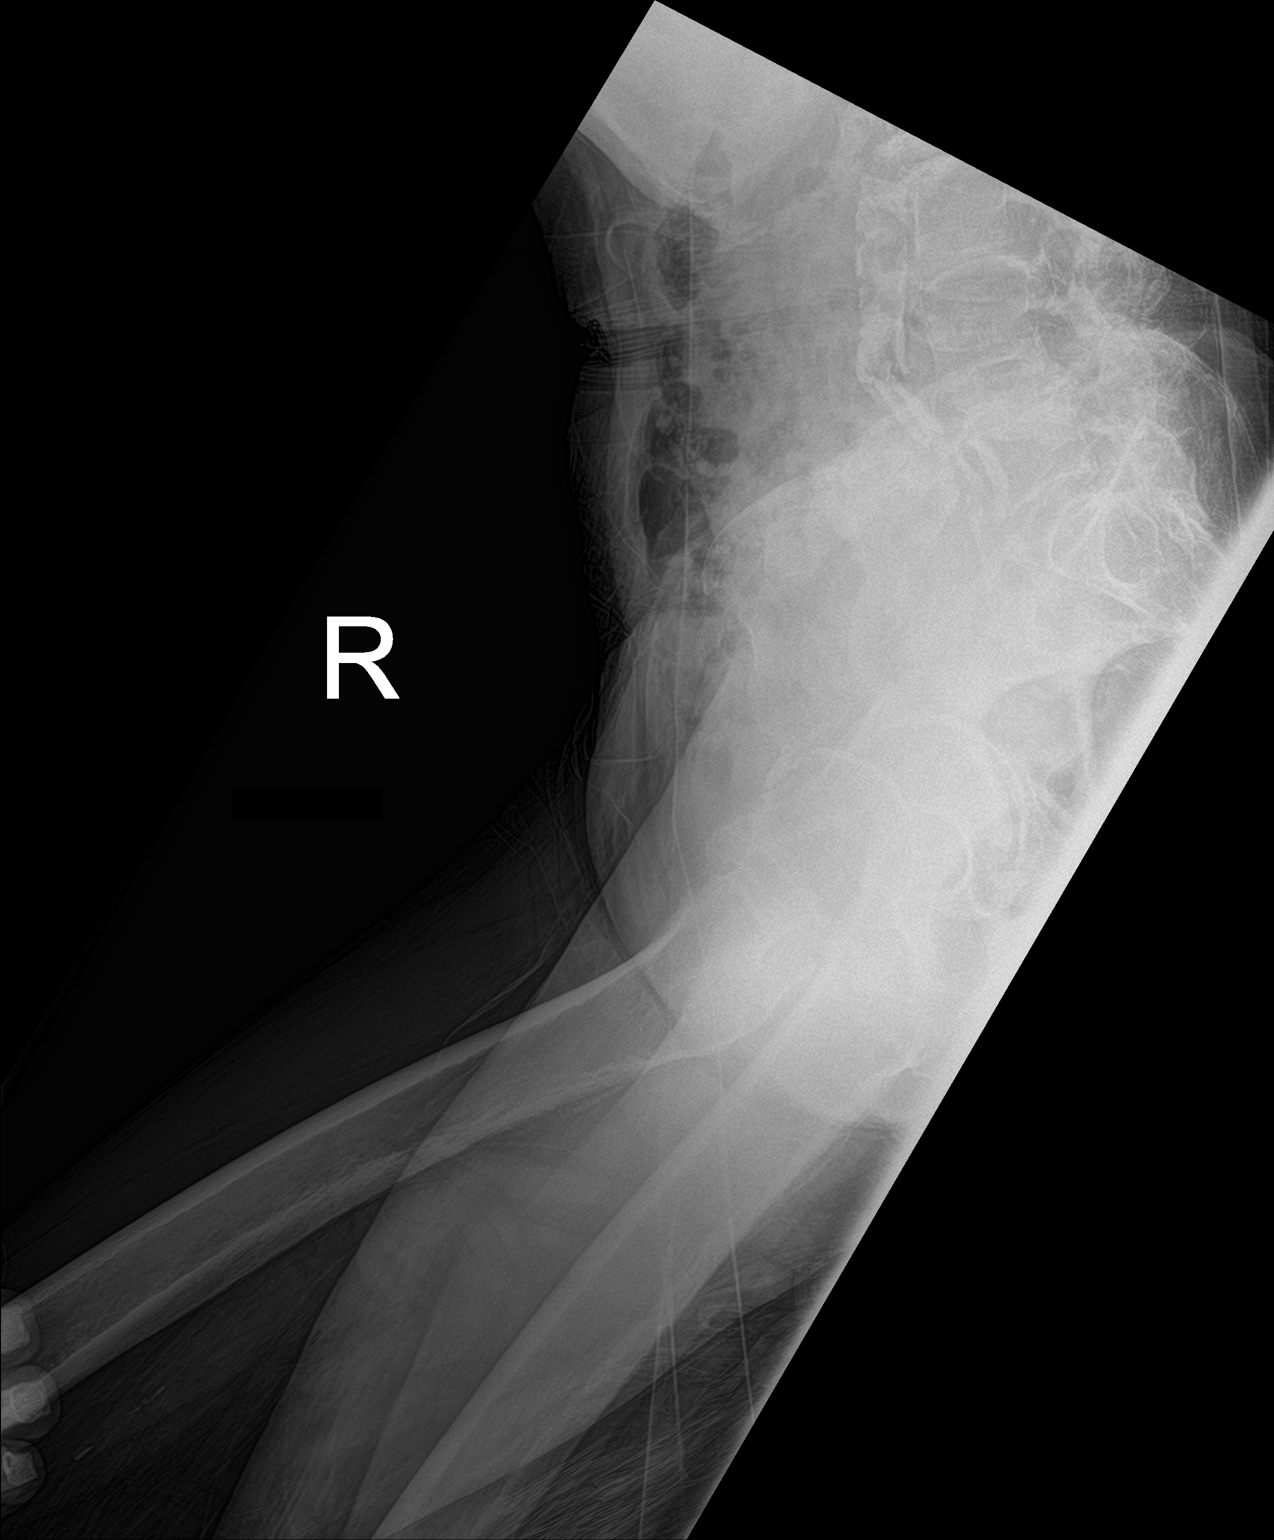

[pelvis ap (1 of 2)]
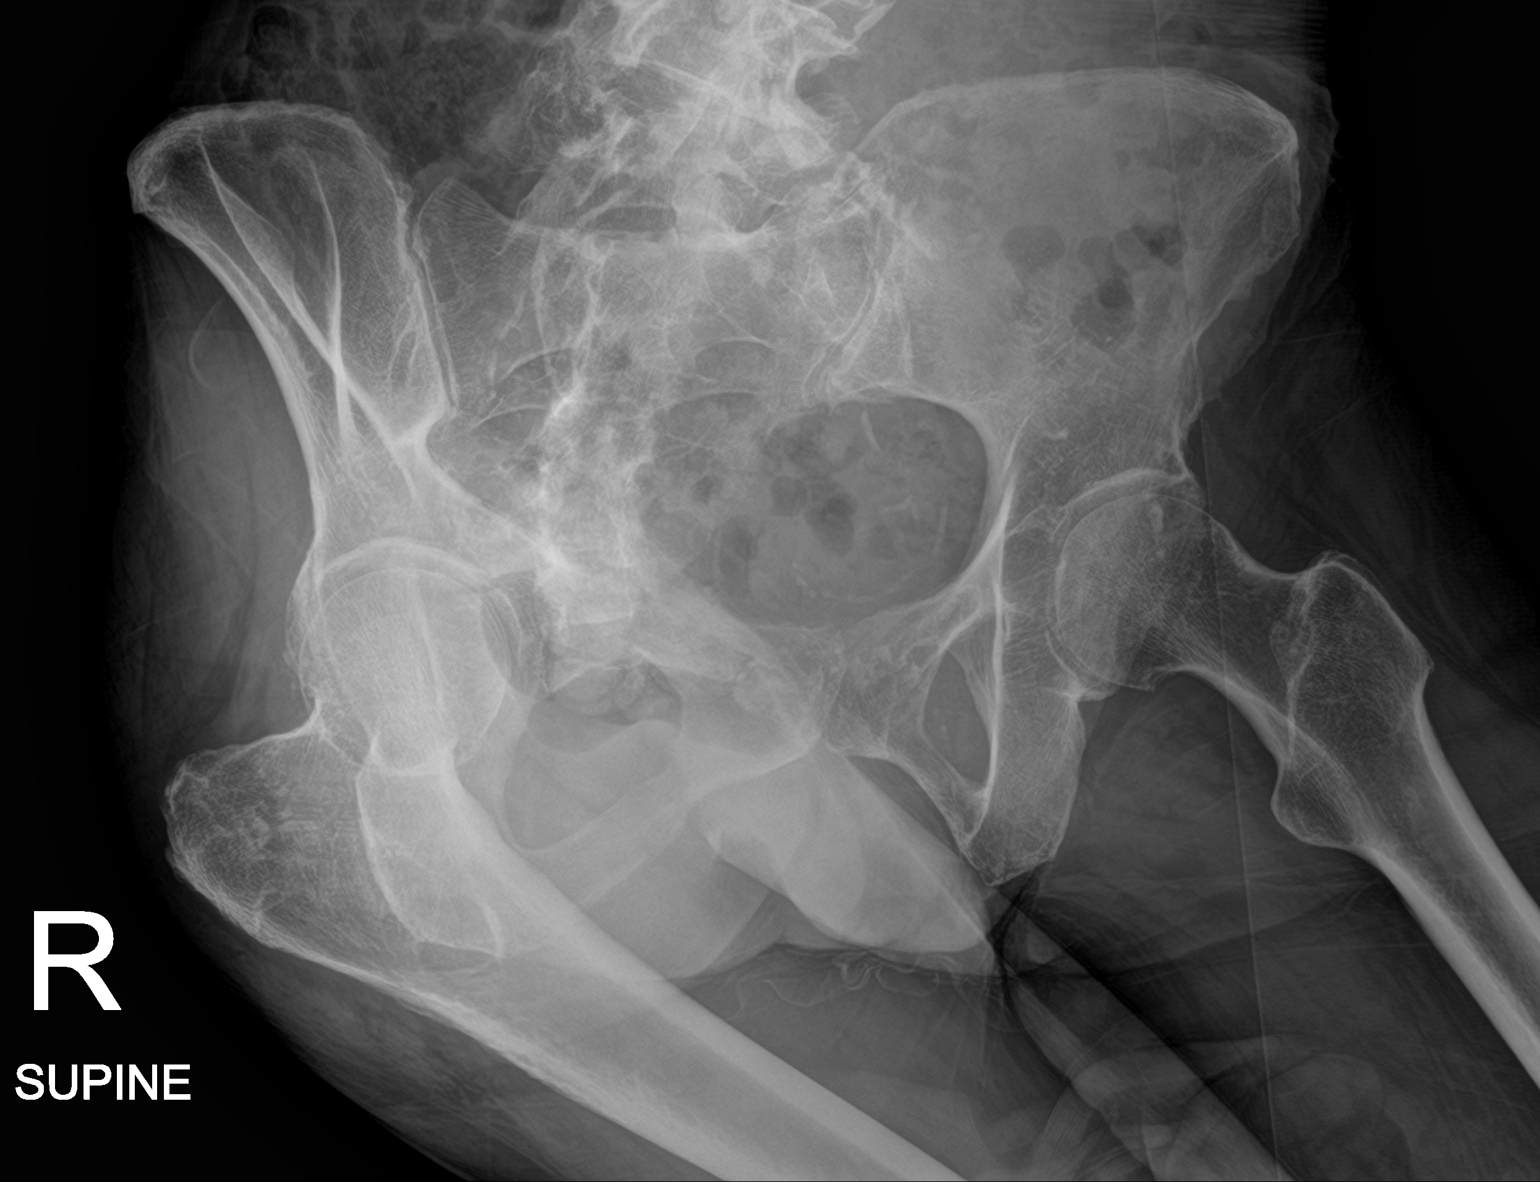

[pelvis ap (2 of 2)]
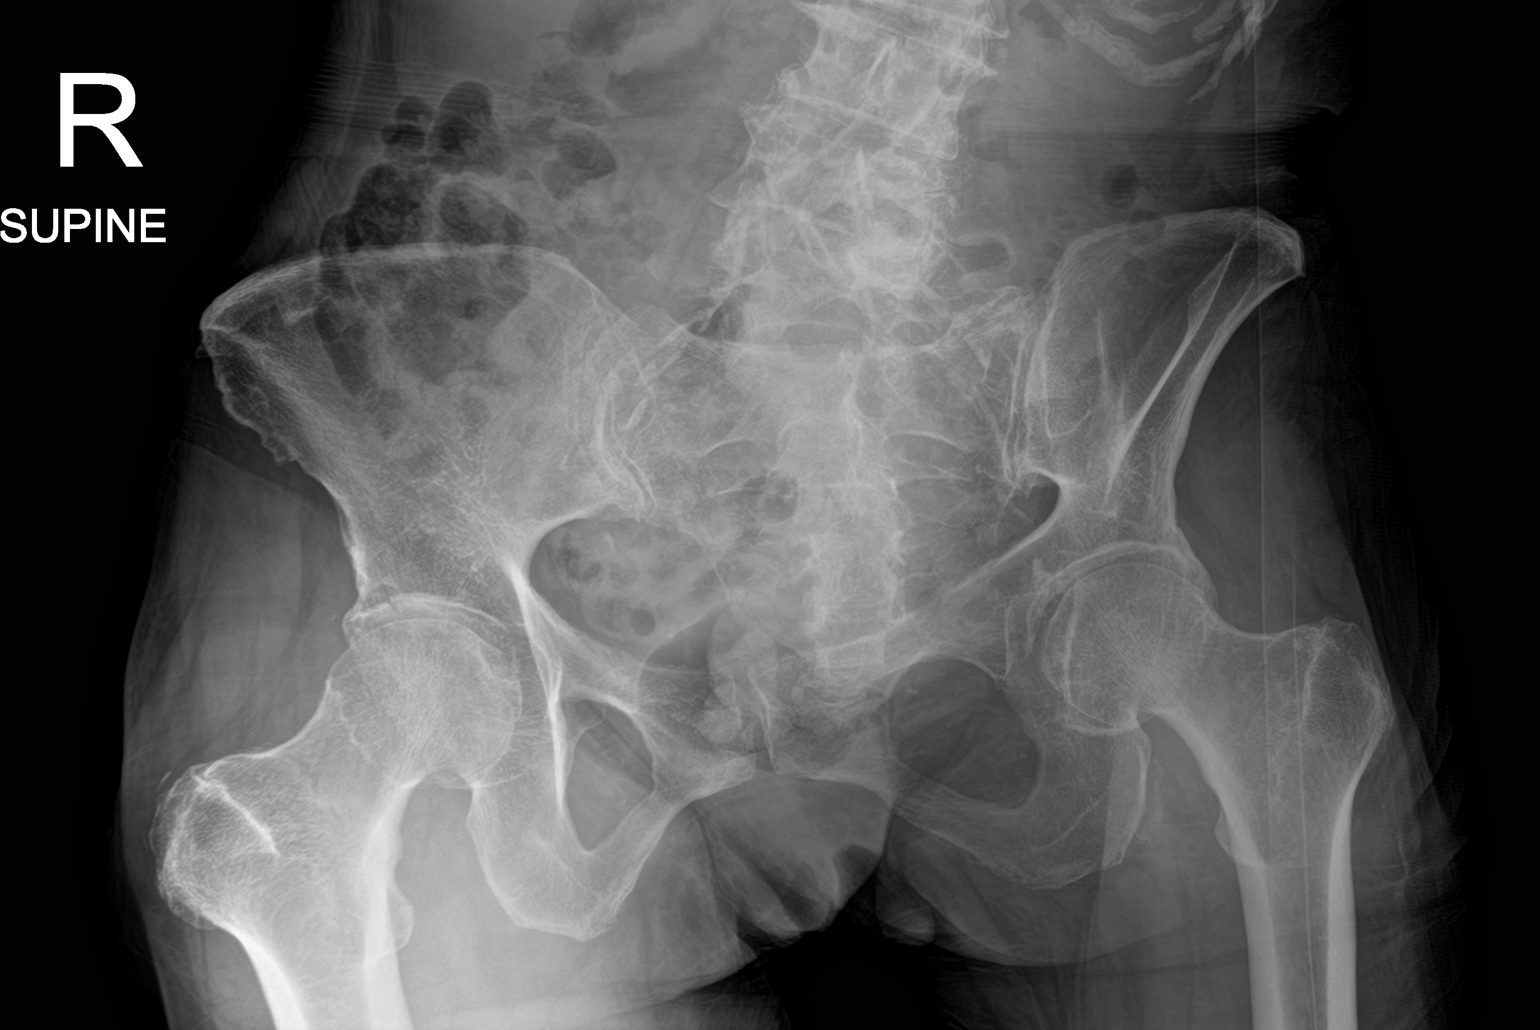

[3 of 3 positions shown; findings below may reference images not displayed]

FINDINGS: There is no evidence of hip fracture or dislocation. There is no
evidence of arthropathy or other focal bone abnormality.
IMPRESSION: Negative.

## 2020-11-29 IMAGING — CT CT HEAD W/O CM
4 series · 16 of 47 positions shown, 18 images · non-contrast
Comparison: Prior head CT [DATE].

CLINICAL DATA: Head trauma, minor.

EXAM:
CT HEAD WITHOUT CONTRAST
TECHNIQUE: Contiguous axial images were obtained from the base of the skull
through the vertex without intravenous contrast.

[Series 3: head without · axial · non-contrast · 0.45mm/px · z∈[-436,-316]mm · 7 of 32 slices shown, 9 images]
[im 4/32  brain]
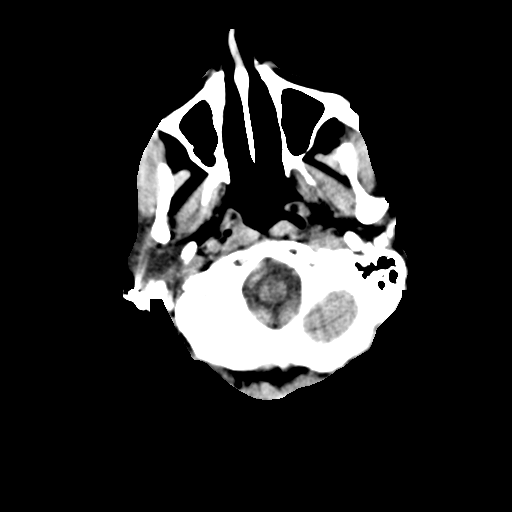
[im 4/32  bone]
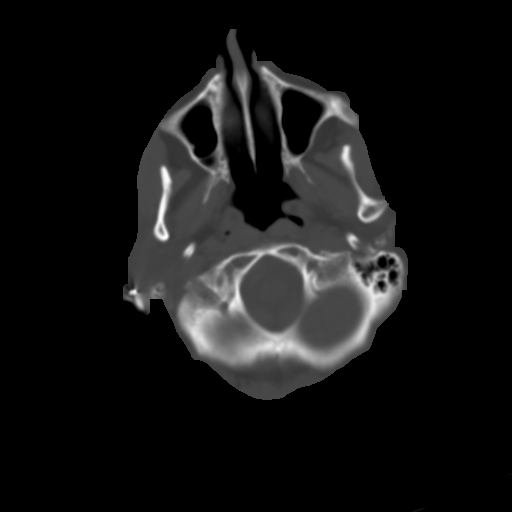
[im 8/32  brain]
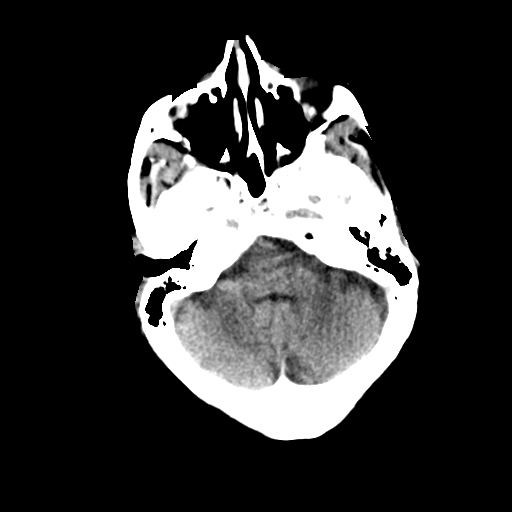
[im 12/32  brain]
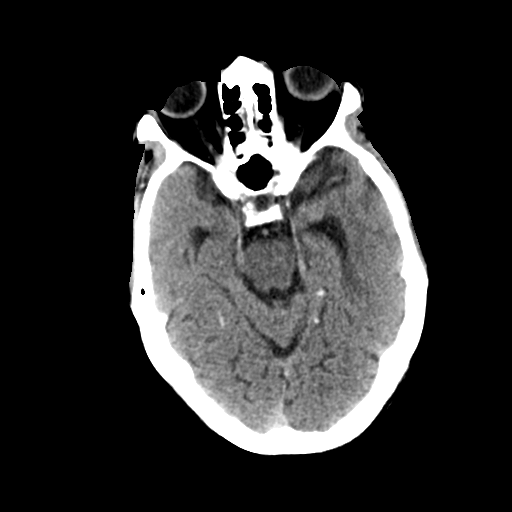
[im 16/32  brain]
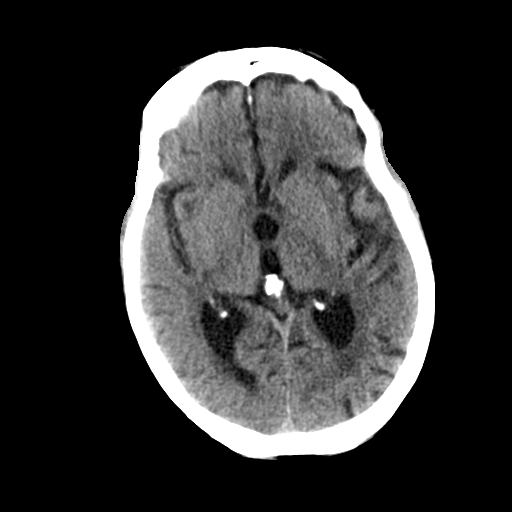
[im 20/32  brain]
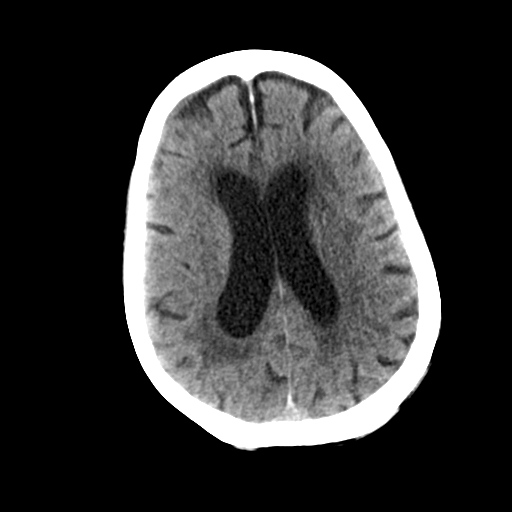
[im 20/32  bone]
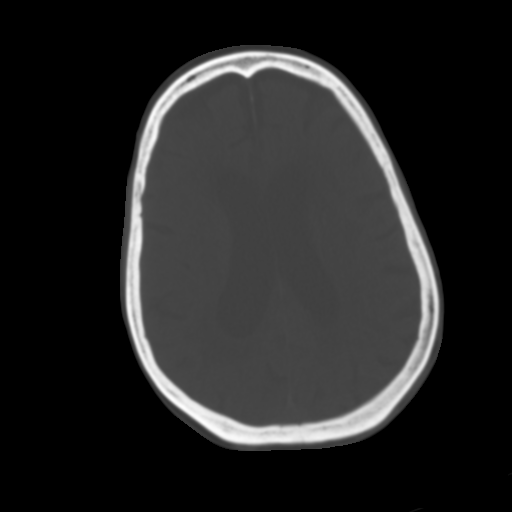
[im 24/32  brain]
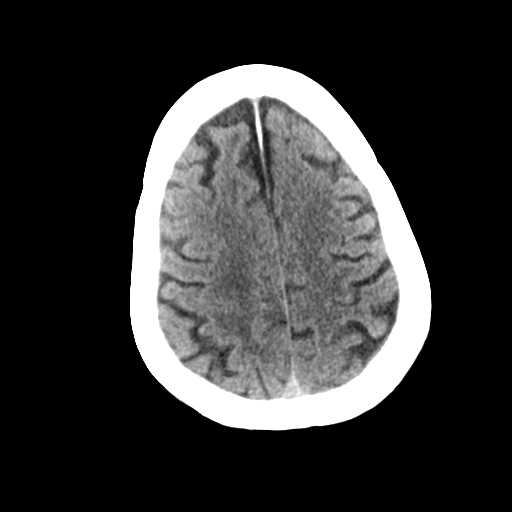
[im 28/32  brain]
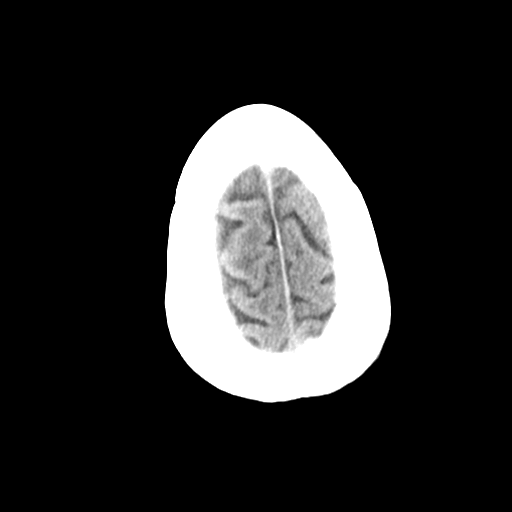

[Series 4: head bone · axial · 0.45mm/px · z∈[-436,-404]mm · 3 of 79 slices shown]
[im 8/79  bone]
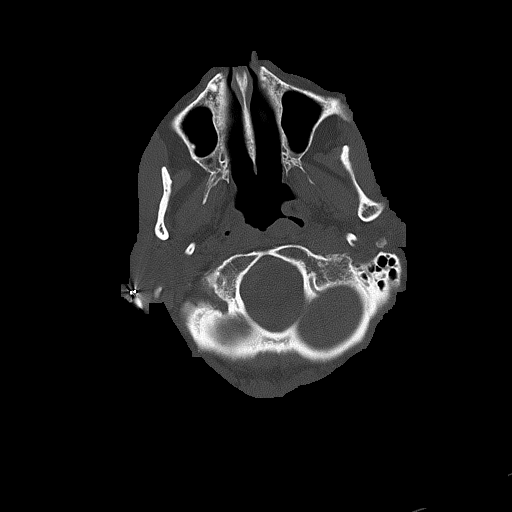
[im 16/79  bone]
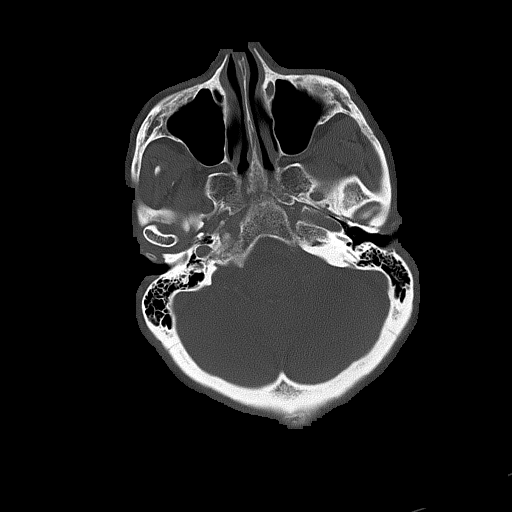
[im 24/79  bone]
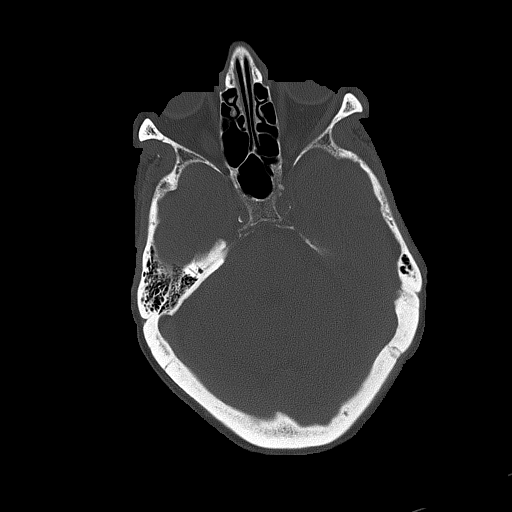

[Series 5: head without cor · coronal · non-contrast · 0.28mm/px · 3 of 72 slices shown]
[im 24/72  brain]
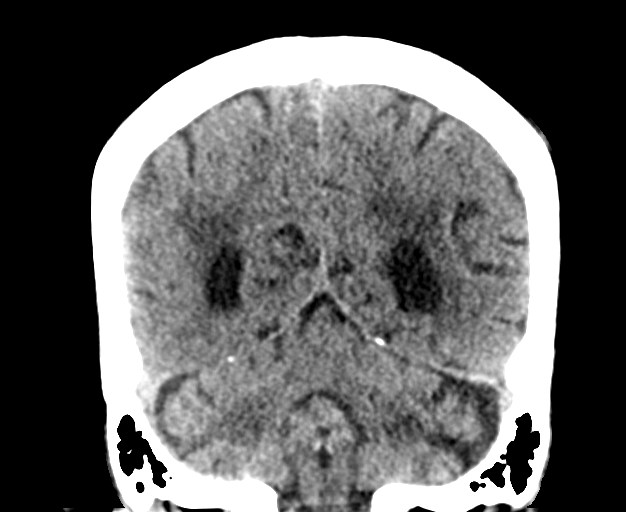
[im 32/72  brain]
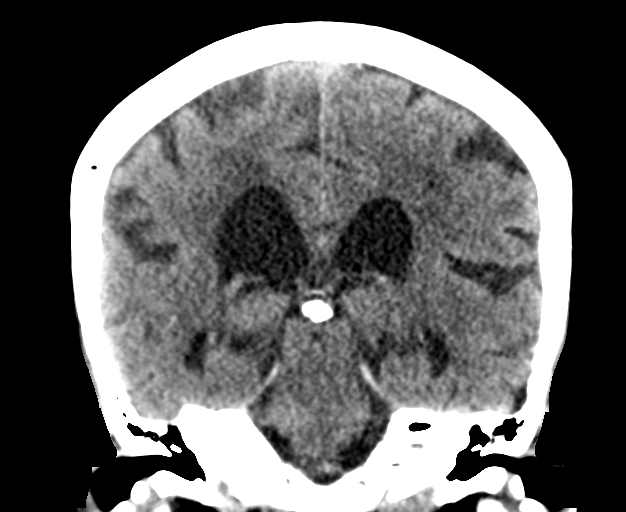
[im 40/72  brain]
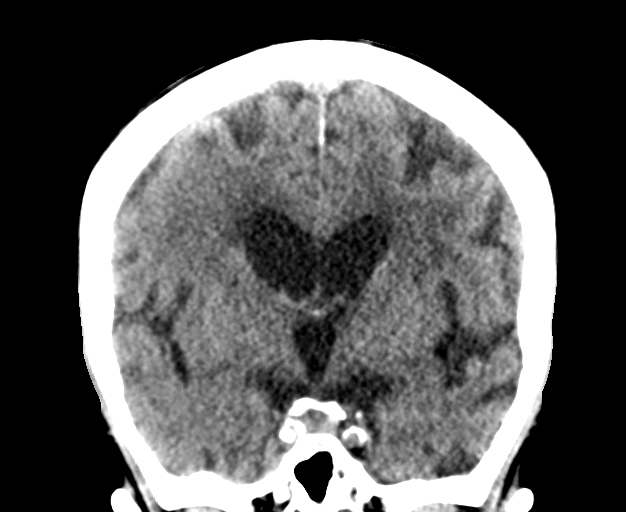

[Series 6: head without sag · sagittal · non-contrast · 0.30mm/px · 3 of 58 slices shown]
[im 20/58  brain]
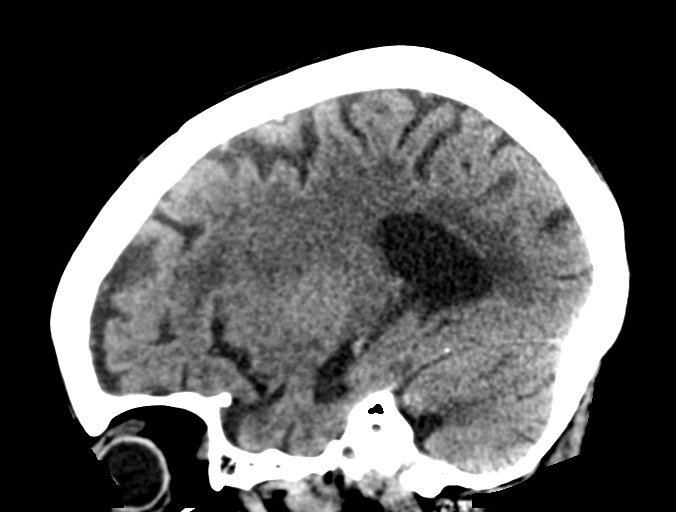
[im 29/58  brain]
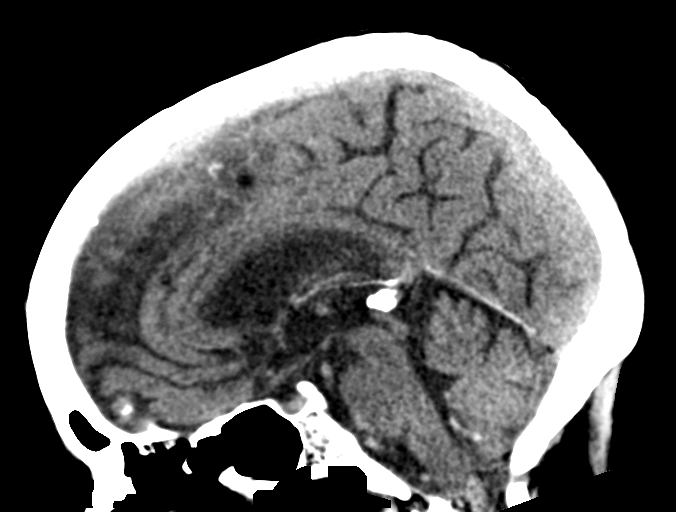
[im 39/58  brain]
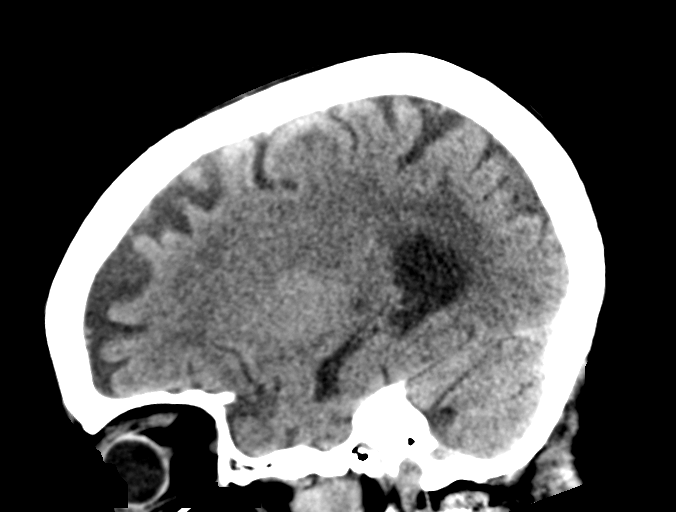

[16 of 47 positions shown; findings below may reference images not displayed]

FINDINGS: Brain:

Moderate cerebral atrophy.

Advanced patchy and confluent hypoattenuation within the cerebral
white matter, nonspecific but compatible with chronic small vessel
ischemic disease.

There is no acute intracranial hemorrhage.

No demarcated cortical infarct.

No extra-axial fluid collection.

No evidence of intracranial mass.

No midline shift.

Vascular: No hyperdense vessel.  Atherosclerotic calcifications.

Skull: Normal. Negative for fracture or focal lesion.

Sinuses/Orbits: Visualized orbits show no acute finding. No
significant paranasal sinus disease at the imaged levels.
IMPRESSION: No evidence of acute intracranial abnormality.

Moderate cerebral atrophy and advanced cerebral white matter chronic
small vessel ischemic disease, stable.

## 2020-11-29 MED ORDER — FENTANYL CITRATE (PF) 100 MCG/2ML IJ SOLN
50.0000 ug | Freq: Once | INTRAMUSCULAR | Status: AC
  Administered 2020-11-29: 50 ug via INTRAVENOUS
  Filled 2020-11-29: qty 2

## 2020-11-29 MED ORDER — POLYETHYLENE GLYCOL 3350 17 G PO PACK
17.0000 g | PACK | Freq: Every day | ORAL | Status: DC
  Administered 2020-11-29 – 2020-12-03 (×4): 17 g via ORAL
  Filled 2020-11-29 (×4): qty 1

## 2020-11-29 MED ORDER — ALBUTEROL SULFATE HFA 108 (90 BASE) MCG/ACT IN AERS
1.0000 | INHALATION_SPRAY | Freq: Four times a day (QID) | RESPIRATORY_TRACT | Status: DC
  Administered 2020-11-30: 1 via RESPIRATORY_TRACT
  Filled 2020-11-29: qty 6.7

## 2020-11-29 MED ORDER — THIAMINE HCL 100 MG PO TABS
100.0000 mg | ORAL_TABLET | Freq: Every day | ORAL | Status: DC
  Administered 2020-11-29 – 2020-12-03 (×5): 100 mg via ORAL
  Filled 2020-11-29 (×5): qty 1

## 2020-11-29 MED ORDER — ESCITALOPRAM OXALATE 10 MG PO TABS
20.0000 mg | ORAL_TABLET | Freq: Every day | ORAL | Status: DC
  Administered 2020-11-30 – 2020-12-03 (×4): 20 mg via ORAL
  Filled 2020-11-29 (×5): qty 2

## 2020-11-29 MED ORDER — FENTANYL CITRATE (PF) 100 MCG/2ML IJ SOLN: 12.5000 ug | INTRAMUSCULAR | Status: DC | PRN

## 2020-11-29 MED ORDER — FOLIC ACID 1 MG PO TABS
1.0000 mg | ORAL_TABLET | Freq: Every day | ORAL | Status: DC
  Administered 2020-11-29 – 2020-12-03 (×5): 1 mg via ORAL
  Filled 2020-11-29 (×5): qty 1

## 2020-11-29 MED ORDER — ONDANSETRON HCL 4 MG/2ML IJ SOLN
4.0000 mg | Freq: Once | INTRAMUSCULAR | Status: AC
  Administered 2020-11-29: 4 mg via INTRAVENOUS
  Filled 2020-11-29: qty 2

## 2020-11-29 MED ORDER — MONTELUKAST SODIUM 10 MG PO TABS
10.0000 mg | ORAL_TABLET | Freq: Every day | ORAL | Status: DC
  Administered 2020-11-30 – 2020-12-02 (×3): 10 mg via ORAL
  Filled 2020-11-29 (×3): qty 1

## 2020-11-29 MED ORDER — ONDANSETRON HCL 4 MG/2ML IJ SOLN: 4.0000 mg | Freq: Four times a day (QID) | INTRAMUSCULAR | Status: DC | PRN

## 2020-11-29 MED ORDER — SODIUM CHLORIDE 0.9% FLUSH
3.0000 mL | Freq: Two times a day (BID) | INTRAVENOUS | Status: DC
  Administered 2020-11-30 – 2020-12-03 (×6): 3 mL via INTRAVENOUS

## 2020-11-29 MED ORDER — HEPARIN SODIUM (PORCINE) 5000 UNIT/ML IJ SOLN
5000.0000 [IU] | Freq: Three times a day (TID) | INTRAMUSCULAR | Status: AC
  Administered 2020-11-29 – 2020-11-30 (×4): 5000 [IU] via SUBCUTANEOUS
  Filled 2020-11-29 (×4): qty 1

## 2020-11-29 MED ORDER — ADULT MULTIVITAMIN W/MINERALS CH
1.0000 | ORAL_TABLET | Freq: Every day | ORAL | Status: DC
  Administered 2020-11-29 – 2020-12-03 (×5): 1 via ORAL
  Filled 2020-11-29 (×5): qty 1

## 2020-11-29 MED ORDER — ROSUVASTATIN CALCIUM 5 MG PO TABS
10.0000 mg | ORAL_TABLET | Freq: Every day | ORAL | Status: DC
  Administered 2020-11-30 – 2020-12-02 (×3): 10 mg via ORAL
  Filled 2020-11-29 (×3): qty 2

## 2020-11-29 MED ORDER — ASPIRIN EC 81 MG PO TBEC: 81.0000 mg | DELAYED_RELEASE_TABLET | Freq: Every day | ORAL | Status: DC

## 2020-11-29 MED ORDER — ARIPIPRAZOLE 5 MG PO TABS
5.0000 mg | ORAL_TABLET | Freq: Every day | ORAL | Status: DC
  Administered 2020-11-30 – 2020-12-03 (×4): 5 mg via ORAL
  Filled 2020-11-29 (×4): qty 1

## 2020-11-29 MED ORDER — HYDROMORPHONE HCL 2 MG PO TABS
1.0000 mg | ORAL_TABLET | ORAL | Status: DC | PRN
  Administered 2020-11-30 – 2020-12-01 (×2): 1 mg via ORAL
  Filled 2020-11-29 (×3): qty 1

## 2020-11-29 MED ORDER — MOMETASONE FURO-FORMOTEROL FUM 200-5 MCG/ACT IN AERO
2.0000 | INHALATION_SPRAY | Freq: Two times a day (BID) | RESPIRATORY_TRACT | Status: DC
  Administered 2020-11-30 – 2020-12-03 (×7): 2 via RESPIRATORY_TRACT
  Filled 2020-11-29: qty 8.8

## 2020-11-29 MED ORDER — CLOPIDOGREL BISULFATE 75 MG PO TABS
75.0000 mg | ORAL_TABLET | Freq: Every day | ORAL | Status: DC
  Administered 2020-11-30 – 2020-12-03 (×4): 75 mg via ORAL
  Filled 2020-11-29 (×4): qty 1

## 2020-11-29 MED ORDER — ONDANSETRON HCL 4 MG PO TABS
4.0000 mg | ORAL_TABLET | Freq: Four times a day (QID) | ORAL | Status: DC | PRN
Start: 2020-11-29 — End: 2020-12-03

## 2020-11-29 MED ORDER — NICOTINE 14 MG/24HR TD PT24
14.0000 mg | MEDICATED_PATCH | Freq: Every day | TRANSDERMAL | Status: DC
  Administered 2020-11-29 – 2020-12-03 (×5): 14 mg via TRANSDERMAL
  Filled 2020-11-29 (×5): qty 1

## 2020-11-29 MED ORDER — ACETAMINOPHEN 650 MG RE SUPP: 650.0000 mg | Freq: Four times a day (QID) | RECTAL | Status: DC | PRN

## 2020-11-29 MED ORDER — CLOPIDOGREL BISULFATE 75 MG PO TABS: 75.0000 mg | ORAL_TABLET | Freq: Every day | ORAL | Status: DC

## 2020-11-29 MED ORDER — ACETAMINOPHEN 325 MG PO TABS
650.0000 mg | ORAL_TABLET | Freq: Four times a day (QID) | ORAL | Status: DC | PRN
  Administered 2020-11-29 – 2020-11-30 (×4): 650 mg via ORAL
  Filled 2020-11-29 (×4): qty 2

## 2020-11-29 NOTE — ED Notes (Signed)
Attempt made to ambulate pt on her room and pt is unable to put any weight on her right leg. Pt unable to ambulate at this time. ED provider notified.

## 2020-11-29 NOTE — ED Triage Notes (Signed)
Pt arrive to ED by EMS from home. EMS called by friends due to pt found by her self on the floor after she fell last night 10/10 right hip pain with movement. Per EMS hight amount of ETOH around pt on arrival to her house.

## 2020-11-29 NOTE — ED Notes (Signed)
Pt transported to MRI 

## 2020-11-29 NOTE — ED Provider Notes (Signed)
[facility] EMERGENCY DEPARTMENT Provider Note   CSN: [id number] Arrival date & time: 11/29/20  1200     History Chief Complaint  Patient presents with   Hip Pain    Crystal Noble is a 75 y.o. female.  Crystal Noble states that she fell sometime last night.  She has no recollection of the fall.  She has been unable to bear weight on her right leg ever since.  She is typically able to ambulate without assistance.  She does live alone.  She reports no other symptoms.  The history is provided by the patient.  Hip Pain This is a new problem. The current episode started 12 to 24 hours ago. The problem occurs constantly. The problem has not changed since onset.Pertinent negatives include no chest pain, no abdominal pain, no headaches and no shortness of breath. The symptoms are aggravated by standing. Nothing relieves the symptoms. She has tried nothing for the symptoms. The treatment provided no relief.      Past Medical History:  Diagnosis Date   Carotid stenosis, asymptomatic    Colitis, collagenous    Hypertension    Stroke ([place]) 06/2019    Patient Active Problem List   Diagnosis Date Noted   Hypokalemia 06/26/2020   Hypomagnesemia 06/26/2020   Alcohol abuse 06/26/2020   Anxiety disorder 06/26/2020   COPD (chronic obstructive pulmonary disease) ([place]) 06/26/2020   Falls 06/26/2020   Multiple falls    AKI (acute kidney injury) ([place]) 06/25/2020   PAD (peripheral artery disease) ([place]) 08/14/2019   Mixed hyperlipidemia 08/14/2019   Exertional dyspnea 05/13/2019   Coronary artery disease involving native coronary artery of native heart without angina pectoris 03/27/2019   Aortic atherosclerosis ([place]) 03/27/2019   Tobacco abuse 03/27/2019   Essential hypertension 03/27/2019   Asymptomatic carotid artery stenosis without infarction 03/27/2014    Past Surgical History:  Procedure Laterality Date   APPENDECTOMY       OB History   No  obstetric history on file.     Family History  Problem Relation Age of Onset   Hypertension Mother    Hyperlipidemia Mother    Heart attack Mother    Depression Mother        Bi-Polar   Cancer Father        Lung    Social History   Tobacco Use   Smoking status: Every Day    Packs/day: 0.50    Years: 30.00    Pack years: 15.00    Types: Cigarettes   Smokeless tobacco: Never  Vaping Use   Vaping Use: Never used  Substance Use Topics   Alcohol use: Yes    Alcohol/week: 21.0 standard drinks    Types: 21 Glasses of wine per week   Drug use: No    Home Medications Prior to Admission medications   Medication Sig Start Date End Date Taking? Authorizing Provider  amLODipine (NORVASC) 10 MG tablet Take 1 tablet (10 mg total) by mouth daily. 07/01/20   Hosie Poisson, MD  ARIPiprazole (ABILIFY) 5 MG tablet Take 1 tablet (5 mg total) by mouth daily. 07/01/20   Hosie Poisson, MD  aspirin EC 81 MG tablet Take 1 tablet (81 mg total) by mouth daily. Swallow whole. 06/30/20   Hosie Poisson, MD  clopidogrel (PLAVIX) 75 MG tablet Take 1 tablet (75 mg total) by mouth daily. 07/23/20   Garvin Fila, MD  escitalopram (LEXAPRO) 20 MG tablet Take 20 mg by mouth daily. 03/17/20  [provider]  estradiol (ESTRACE) 0.1 MG/GM vaginal cream Place 1 Applicatorful vaginally at bedtime. 06/23/20   [provider]  feeding supplement (ENSURE ENLIVE / ENSURE PLUS) LIQD Take 237 mLs by mouth 3 (three) times daily between meals. 06/30/20 07/25/21  Hosie Poisson, MD  fluticasone-salmeterol (ADVAIR HFA) 230-21 MCG/ACT inhaler Inhale 2 puffs into the lungs 2 (two) times daily.    [provider]  folic acid (FOLVITE) 1 MG tablet Take 1 tablet (1 mg total) by mouth daily. 07/01/20   Hosie Poisson, MD  megestrol (MEGACE) 40 MG tablet Take 40 mg by mouth in the morning, at noon, and at bedtime.    [provider]  montelukast (SINGULAIR) 10 MG tablet Take 10 mg by mouth at  bedtime.    [provider]  Multiple Vitamin (MULTIVITAMIN WITH MINERALS) TABS tablet Take 1 tablet by mouth daily. 07/01/20   Hosie Poisson, MD  rosuvastatin (CRESTOR) 10 MG tablet Take 10 mg by mouth at bedtime. 04/26/20   [provider]  senna-docusate (SENOKOT-S) 8.6-50 MG tablet Take 1 tablet by mouth at bedtime as needed for mild constipation. 06/30/20   Hosie Poisson, MD  thiamine 100 MG tablet Take 1 tablet (100 mg total) by mouth daily. 07/01/20   Hosie Poisson, MD  valsartan-hydrochlorothiazide (DIOVAN-HCT) 320-25 MG tablet Take 1 tablet by mouth daily. 04/14/20   [provider]    Allergies    Penicillin g  Review of Systems   Review of Systems  Constitutional:  Negative for chills and fever.  HENT:  Negative for ear pain and sore throat.   Eyes:  Negative for pain and visual disturbance.  Respiratory:  Negative for cough and shortness of breath.   Cardiovascular:  Negative for chest pain and palpitations.  Gastrointestinal:  Negative for abdominal pain and vomiting.  Genitourinary:  Negative for dysuria and hematuria.  Musculoskeletal:  Negative for arthralgias and back pain.  Skin:  Negative for color change and rash.  Neurological:  Negative for seizures, syncope and headaches.  All other systems reviewed and are negative.  Physical Exam Updated Vital Signs BP (!) 178/63 (BP Location: Right Arm)   Pulse 90   Temp 98.2 F (36.8 C) (Oral)   Resp 18   Ht 5' (1.524 m)   Wt 35.5 kg   SpO2 99%   BMI 15.28 kg/m   Physical Exam Vitals and nursing note reviewed.  Constitutional:      General: She is not in acute distress.    Appearance: She is well-developed.     Comments: Frail-appearing  HENT:     Head: Normocephalic and atraumatic.  Eyes:     Extraocular Movements: Extraocular movements intact.     Conjunctiva/sclera: Conjunctivae normal.     Pupils: Pupils are equal, round, and reactive to light.  Cardiovascular:     Rate and  Rhythm: Normal rate and regular rhythm.     Heart sounds: No murmur heard. Pulmonary:     Effort: Pulmonary effort is normal. No respiratory distress.     Breath sounds: Normal breath sounds.  Abdominal:     Palpations: Abdomen is soft.     Tenderness: There is no abdominal tenderness.  Musculoskeletal:     Cervical back: Neck supple.     Comments: There is no midline lumbar spine tenderness to palpation.  She has tenderness to palpation of the right posterior hip over the ischial tuberosity.  Her hip is in a flexed and internally rotated position, and  any motion of the hip provokes severe pain.  The extremity is warm and well-perfused.  Skin:    General: Skin is warm and dry.  Neurological:     General: No focal deficit present.     Mental Status: She is alert.     Sensory: No sensory deficit.     Motor: No weakness.    ED Results / Procedures / Treatments   Labs (all labs ordered are listed, but only abnormal results are displayed) Labs Reviewed  COMPREHENSIVE METABOLIC PANEL - Abnormal; Notable for the following components:      Result Value   CO2 19 (*)    Creatinine, Ser 1.06 (*)    Total Bilirubin 2.3 (*)    GFR, Estimated 55 (*)    Anion gap 17 (*)    All other components within normal limits  CBC WITH DIFFERENTIAL/PLATELET - Abnormal; Notable for the following components:   MCV 102.9 (*)    All other components within normal limits  RESP PANEL BY RT-PCR (FLU A&B, COVID) ARPGX2  ETHANOL  PROTIME-INR  URINALYSIS, ROUTINE W REFLEX MICROSCOPIC  TROPONIN I (HIGH SENSITIVITY)  TROPONIN I (HIGH SENSITIVITY)    EKG EKG Interpretation  Date/Time:  Sunday November 29 2020 12:57:26 EDT Ventricular Rate:  82 PR Interval:  137 QRS Duration: 75 QT Interval:  408 QTC Calculation: 477 R Axis:   70 Text Interpretation: Sinus rhythm Left atrial enlargement Left ventricular hypertrophy Anterior Q waves, possibly due to LVH No acute ischemia Confirmed by Lorre Munroe ([id number]) on  11/29/2020 7:46:49 PM  Radiology CT Head Wo Contrast  Result Date: 11/29/2020 CLINICAL DATA:  Head trauma, minor. EXAM: CT HEAD WITHOUT CONTRAST TECHNIQUE: Contiguous axial images were obtained from the base of the skull through the vertex without intravenous contrast. COMPARISON:  Prior head CT 06/25/2020. FINDINGS: Brain: Moderate cerebral atrophy. Advanced patchy and confluent hypoattenuation within the cerebral white matter, nonspecific but compatible with chronic small vessel ischemic disease. There is no acute intracranial hemorrhage. No demarcated cortical infarct. No extra-axial fluid collection. No evidence of intracranial mass. No midline shift. Vascular: No hyperdense vessel.  Atherosclerotic calcifications. Skull: Normal. Negative for fracture or focal lesion. Sinuses/Orbits: Visualized orbits show no acute finding. No significant paranasal sinus disease at the imaged levels. IMPRESSION: No evidence of acute intracranial abnormality. Moderate cerebral atrophy and advanced cerebral white matter chronic small vessel ischemic disease, stable. Electronically Signed   By: Kellie Simmering DO   On: 11/29/2020 13:47   MR HIP RIGHT WO CONTRAST  Result Date: 11/29/2020 CLINICAL DATA:  Severe right hip pain after fall EXAM: MR OF THE RIGHT HIP WITHOUT CONTRAST TECHNIQUE: Multiplanar, multisequence MR imaging was performed. No intravenous contrast was administered. COMPARISON:  X-ray 11/29/2020 FINDINGS: Bones/Joint/Cartilage Acute nondisplaced fracture of the right acetabulum involving the acetabular roof and anterior acetabulum. Associated bone marrow edema. No articular surface depression or diastasis. The proximal right femur is intact without fracture or dislocation. Small right hip joint effusion. No focal cartilage defect. Remainder of the bony pelvis is intact. No additional fracture. No pelvic diastasis. Left hip joint intact. Degenerative disc disease within the lower lumbar spine. Ligaments Partial  tear of the iliofemoral ligament from its iliac attachment (series 8, image 17). Muscles and Tendons Intramuscular edema about the right hip joint, most pronounced within the adductor compartment. No acute tendinous injury. Soft tissues No fluid collection or hematoma. No inguinal lymphadenopathy. No acute findings are evident within the pelvis. IMPRESSION: 1. Acute nondisplaced fracture of the  right acetabulum involving the acetabular roof and anterior acetabulum. Small right hip joint effusion. 2. Partial tear of the iliofemoral ligament from its iliac attachment. Electronically Signed   By: Davina Poke D.O.   On: 11/29/2020 17:02   DG Hip Unilat W or Wo Pelvis 1 View Right  Result Date: 11/29/2020 CLINICAL DATA:  Fall EXAM: DG HIP (WITH OR WITHOUT PELVIS) 1V RIGHT COMPARISON:  09/25/2017 FINDINGS: There is no evidence of hip fracture or dislocation. There is no evidence of arthropathy or other focal bone abnormality. IMPRESSION: Negative. Electronically Signed   By: Franchot Gallo M.D.   On: 11/29/2020 13:16    Procedures Procedures   Medications Ordered in ED Medications  fentaNYL (SUBLIMAZE) injection 50 mcg (has no administration in time range)  ondansetron (ZOFRAN) injection 4 mg (has no administration in time range)    ED Course  I have reviewed the triage vital signs and the nursing notes.  Pertinent labs & imaging results that were available during my care of the patient were reviewed by me and considered in my medical decision making (see chart for details).  Clinical Course as of 11/29/20 1946  Sun Nov 29, 2020  1438 Plain films negative. Patient cannot bear weight. Will order MRI.  [AW]  1853 I spoke with Dr. Lucia Gaskins with ortho.  I spoke to internal medicine teaching service who will admit. [AW]    Clinical Course User Index [AW] Arnaldo Natal, MD   MDM Rules/Calculators/A&P                          Andee Poles did with a fall.  She was complaining of right  hip pain, and initial films were negative.  MRI was obtained which revealed an acetabular fracture.  Orthopedic surgery and internal medicine have both been consulted, and the patient will be admitted to the hospital. Final Clinical Impression(s) / ED Diagnoses Final diagnoses:  Fall, initial encounter  Closed nondisplaced fracture of anterior wall of right acetabulum, initial encounter [facility])    Rx / DC Orders ED Discharge Orders     None        Arnaldo Natal, MD 11/29/20 1951

## 2020-11-29 NOTE — ED Notes (Signed)
Pt transferred to room 27 per CN request, report given to Percy, Therapist, sports. Pt resting on bed comfortable connected to vitals sign monitor, bilateral rails up, NAD noticed.

## 2020-11-29 NOTE — H&P (Addendum)
Date: 11/29/2020               Patient Name:  Crystal Noble MRN: [id number]  DOB: [birth year] Age / Sex: 75 y.o., female   PCP: Fanny Bien, MD         Medical Service: Internal Medicine Teaching Service         Attending Physician: Dr. Joya Gaskins, Mancel Bale, MD    First Contact: Virl Axe, MD Pager: Sandrea Hammond [phone]  Second Contact: Jose Persia, MD Pager: Blima Rich [phone]       After Hours (After 5p/  First Contact Pager: [phone]  weekends / holidays): Second Contact Pager: [phone]   SUBJECTIVE   Chief Complaint: Fall   History of Present Illness: Crystal Noble is a 75 y.o. female with a pertinent PMH of carotid stenosis, PAD, CAD, HTN, CVA (06/2019), COPD, tobacco use disorder, alcohol use disorder, who presents to [facility] after a fall.  Patient states that she woke up this morning with right hip pain and believes she feel overnight. She does not remember many of the details regarding how she fell but believes that it happened around 9 pm. She remembers being in bed and having to go to the bathroom but does not remember going and she is unsure where she fell. She thinks  that it may have happened in the kitchen as there was a mess on the floor the was not there previously.  She endorses difficulty with her memory, right leg weakness and ataxia since her stroke back in january of 2021. She states that she lives alone and is fairly independent with her activities of daily living. The last time she fell was when she had her stroke and she is concerned that she is currently having another CVA. She is unsure if she passed out, but denies previous syncope or seizure-like activity. She admits to drinking two glasses of wine last night, but endorses drinking the same amount every night without an issues. She denies a history of ETOH withdrawal. She denies any new focal weakness, numbness/tingling, dysarthria, or change in vision. She states that she has been experiencing some postural dizziness,  which occurs when she stands up. The dizziness last for only a few seconds and started after her stroke. She denies any gait instability/dizziness when she walks and is able to walk a block without assistance. However, she does have difficulty with climbing stairs. She feels like she is SHOB breath easier wither exertion, but denies any chest pain, palpitations, or lower extremity edema. She denies any new medications or recent changes in medications, but endorses occasionally missing dose of her meds. She endorses poor PO intake and attributes it to not enough energy at the end of a d to make herself food. She has had a roughly 2 kg drop in weight since January of 2021.   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 Jasper Loser Substance use - 1 pack/day for 50+ years, drinks 2 glasses of wine daily, no nonprescription drug  use  Family History: Mom - HTN, HLD, MI, Depression Dad - Lung Cancer   Allergies: Allergies as of 11/29/2020 - Review Complete 11/29/2020  Allergen Reaction Noted   Penicillin g Rash 09/30/2016     Review of Systems: A complete ROS was negative except as per HPI.   OBJECTIVE:   Physical Exam: Blood pressure (!) 191/78, pulse 80, temperature 98.2 F (36.8 C), temperature source Oral, resp. rate 18, height 5' (1.524 m), weight 35.5 kg, SpO2 97 %. Physical Exam Constitutional:      General: She is not in acute distress.    Appearance: She is cachectic.  HENT:      Head: Normocephalic and atraumatic.     Mouth/Throat:     Mouth: Mucous membranes are moist.  Eyes:     Extraocular Movements: Extraocular movements intact.     Pupils: Pupils are equal, round, and reactive to light.  Cardiovascular:     Rate and Rhythm: Normal rate and regular rhythm.     Pulses: Normal pulses.     Heart sounds: Normal heart sounds.  Pulmonary:     Effort: Pulmonary effort is normal.     Breath sounds: Normal breath sounds.  Abdominal:     General: Abdomen is flat. Bowel sounds are normal.     Palpations: Abdomen is soft.  Musculoskeletal:        General: Tenderness (right hip with movement and palpation) present.     Cervical back: Normal range of motion. No rigidity.     Right lower leg: No edema.     Left lower leg: No edema.  Skin:    General: Skin is warm and dry.  Neurological:     General: No focal deficit present.     Mental Status: She is alert and oriented to person, place, and time.     Cranial Nerves: No cranial nerve deficit.     Sensory: No sensory deficit.     Motor: Weakness (Lower extremity weakness due to pain) present.     Coordination: Coordination normal.  Psychiatric:        Mood and Affect: Mood normal.     Labs: CBC    Component Value Date/Time   WBC 8.7 11/29/2020 1226   RBC 4.07 11/29/2020 1226   HGB 13.8 11/29/2020 1226   HCT 41.9 11/29/2020 1226   PLT 200 11/29/2020 1226   MCV 102.9 (H) 11/29/2020 1226   MCH 33.9 11/29/2020 1226   MCHC 32.9 11/29/2020 1226   RDW 13.8 11/29/2020 1226   LYMPHSABS 0.7 11/29/2020 1226   MONOABS 0.8 11/29/2020 1226   EOSABS 0.0 11/29/2020 1226   BASOSABS 0.0 11/29/2020 1226     CMP     Component Value Date/Time   NA 137 11/29/2020 1226   K 4.3 11/29/2020 1226   CL 101 11/29/2020 1226   CO2 19 (L) 11/29/2020 1226   GLUCOSE 87 11/29/2020 1226   BUN 22 11/29/2020 1226   CREATININE 1.06 (H) 11/29/2020 1226   CALCIUM 9.8 11/29/2020 1226   PROT 7.4 11/29/2020 1226   PROT 7.2  07/23/2020 0910   ALBUMIN 4.3 11/29/2020 1226   AST 30 11/29/2020 1226   ALT 24 11/29/2020 1226   ALKPHOS 62 11/29/2020 1226   BILITOT 2.3 (H) 11/29/2020 1226   GFRNONAA 55 (L) 11/29/2020 1226    Imaging: MRI Brain wo contrast: FINDINGS: Brain: No acute infarct, mass effect or extra-axial collection. Punctate focus of DWI hyperintensity at the posterior right basal ganglia is  unchanged since 06/18/2020. No acute or chronic hemorrhage. Normal white matter signal, parenchymal volume and CSF spaces. The midline structures are normal.   Vascular: Major flow voids are preserved.   Skull and upper cervical spine: Normal calvarium and skull base. Visualized upper cervical spine and soft tissues are normal.   Sinuses/Orbits:No paranasal sinus fluid levels or advanced mucosal thickening. No mastoid or middle ear effusion. Normal orbits.   IMPRESSION: 1. No acute intracranial abnormality. 2. Punctate focus of DWI hyperintensity at the posterior right basal ganglia is unchanged since 06/18/2020. This may indicate a small focus of chronic ischemia.  CT Head Wo Contrast Brain: Moderate cerebral atrophy. Advanced patchy and confluent hypoattenuation within the cerebral white matter, nonspecific but compatible with chronic small vessel ischemic disease. There is no acute intracranial hemorrhage. No demarcated cortical infarct. No extra-axial fluid collection. No evidence of intracranial mass. No midline shift. Vascular: No hyperdense vessel.  Atherosclerotic calcifications. Skull: Normal. Negative for fracture or focal lesion. Sinuses/Orbits: Visualized orbits show no acute finding. No significant paranasal sinus disease at the imaged levels.   IMPRESSION: No evidence of acute intracranial abnormality. Moderate cerebral atrophy and advanced cerebral white matter chronic small vessel ischemic disease, stable.  MR HIP RIGHT WO CONTRAST Bones/Joint/Cartilage Acute nondisplaced fracture of the  right acetabulum involving the acetabular roof and anterior acetabulum. Associated bone marrow edema. No articular surface depression or diastasis. The proximal right femur is intact without fracture or dislocation. Small right hip joint effusion. No focal cartilage defect. Remainder of the bony pelvis is intact. No additional fracture. No pelvic diastasis. Left hip joint intact. Degenerative disc disease within the lower lumbar spine. Ligaments Partial tear of the iliofemoral ligament from its iliac attachment (series 8, image 17). Muscles and Tendons Intramuscular edema about the right hip joint, most pronounced within the adductor compartment. No acute tendinous injury. Soft tissues No fluid collection or hematoma. No inguinal lymphadenopathy. No acute findings are evident within the pelvis.   IMPRESSION: 1. Acute nondisplaced fracture of the right acetabulum involving the acetabular roof and anterior acetabulum. Small right hip joint effusion. 2. Partial tear of the iliofemoral ligament from its iliac attachment.   DG Hip Unilat W or Wo Pelvis 1 View Right There is no evidence of hip fracture or dislocation. There is no evidence of arthropathy or other focal bone abnormality.   IMPRESSION: Negative.   EKG: personally reviewed my interpretation is sinus rhythm with left atrial enlargement and LVH  ASSESSMENT & PLAN:   Active Problems: Right hip fracture  Fall Tobacco use disorder  CVA HLD PAD/CAD HTN  Crystal Noble is a 75 y.o. with pertinent PMH of carotid stenosis, PAD, CAD, HTN, CVA (06/2019), COPD, tobacco use disorder, alcohol use disorder, who presented after a fall and admit for right hip fracture on hospital day 0  #Fall: #Right acetabular fracture: Patient presents with right acetabular fracture after a fall. She has a difficult time remembering the events leading up to her fall. CT/MRI of brain did not any acute ischemia. She does admit to orthostatic symptoms, which  could be secondary to residual CVA deficits vs medications vs decreased PO intakes vs ETOH use. Patient likely has some degree of ischemic heart disease, but she doe have an previous echos to assess structural heart damage. Therefore, it is difficult to determine if this could have contributed to her fall. She would likely benefit form echocardiogram at some point during her admission. Pateint has an RSI showing class 2 risk, which is  consistent with a 6.0% 30-day risk of death, MI, or cardiac arrest - Ortho consulted. I appreciate their recommendations. NPO at midnight. - PT/OT consulted - Pain management with Dilaudid 1 mg q 4 hours prn moderate pain and fentanyl 12.5 mg q 4 hours prn for breakthrough pain  - Will likely need further evaluation for presyncope after surgery. Consider Echocardiogram and orthostatic vitals.   #Hx of Ischemic Infarct, left carotid/proximal ICA & right corona radiata  #PAD/CA D #HLD: Patient has a history of ischemic infarct.MRA showed small subacute to chronic infarct of the right corona radiata. Previous CTA 80-90% narrowing of the left carotid bulb/proximal ICA. 40% narrowing of the right carotid bulb and 20% narrowing of the proximal right ICA. Mild bilateral cavernous segment and right P1 segment narrowing. She has been on asa 81 mg and plavix 75 mg daily. She is also taking rosuvastatin 10 mg daily. Will likely need to restart these medications tomorrow depending on need for surgery.  - Restart rosuvastatin 10 mg tomorrow - Restart Plavix 75 mg tomorrow   #HTN: Patient is on valsartan-HCTz 320-25 mg and amlodipine 10 mg daily at home. She is not sure if she took this medication today. She is currently hypertensive to the 192/78, which is likely due to pain. Will treat her pain and see if her BP improve. Will likely need to restart home BP medications tomorrow.  - Will restart amlodipine tomorrow and consider restarting valsartan-HCTz depending on response to BP and  creatine clearance.  - Will need to watch BP closely she may be orthostatic secondary her medications.    #Weight loss:  Patient has had a 2 kg weight loss since January 2021. She appears cachectic on evaluation. She states that she lives alone and often feels too tired to make herself food at night. She denies any dysphagia, abdominal pain, early satiety, fevers, chills, or night sweats. She also does not have nay social support in the areas to help her.   - RD consult for improved nutrition. - Will get [facility] consult for community resources   #At risk ETOH use: #Tobacco use disorder: Patient has a diagnosis of ETOH use without know complication form her use. She denies previous withdrawal or DTs. She also has a significant smoking history. - CIWA without ativan - Nicotine patch - supplement folic acid, thiamine, and multivitamin  #COPD: Patient has not had PFTs done. She is not currently wheezing on physical exam. Will restart home medications.  - Continue albuterol, montelukast and dulera  #Hyperbilirubinemia: Patient had elevated bilirubin to 2.3. Otherwise no evidence of hepatocellular or cholestasis. CT chest from 2020 shows evidence of hepatic steatosis.  - Repeat CMP in the morning - Consider work up for hepatic cirrhosis   Diet: Heart Healthy>NPO at midnight VTE: Heparin IVF: None,None Code: Full  Prior to Admission Living Arrangement: Home, living alone Anticipated Discharge Location: SNF Barriers to Discharge: further evaluation   Dispo: Admit patient to Inpatient with expected length of stay greater than 2 midnights.  Signed: Lawerance Cruel, D.O.  Internal Medicine Resident, PGY-2 [facility]  Pager: [phone] 6:54 PM, 11/29/2020   Please contact the on call pager after 5 pm and on weekends at [phone].

## 2020-11-30 DIAGNOSIS — I10 Essential (primary) hypertension: Secondary | ICD-10-CM

## 2020-11-30 DIAGNOSIS — R296 Repeated falls: Secondary | ICD-10-CM

## 2020-11-30 DIAGNOSIS — G3184 Mild cognitive impairment, so stated: Secondary | ICD-10-CM | POA: Diagnosis present

## 2020-11-30 DIAGNOSIS — S32401A Unspecified fracture of right acetabulum, initial encounter for closed fracture: Secondary | ICD-10-CM

## 2020-11-30 LAB — COMPREHENSIVE METABOLIC PANEL
ALT: 20 U/L (ref 0–44)
AST: 25 U/L (ref 15–41)
Albumin: 3.9 g/dL (ref 3.5–5.0)
Alkaline Phosphatase: 60 U/L (ref 38–126)
Anion gap: 14 (ref 5–15)
BUN: 21 mg/dL (ref 8–23)
CO2: 22 mmol/L (ref 22–32)
Calcium: 9.6 mg/dL (ref 8.9–10.3)
Chloride: 100 mmol/L (ref 98–111)
Creatinine, Ser: 1.08 mg/dL — ABNORMAL HIGH (ref 0.44–1.00)
GFR, Estimated: 54 mL/min — ABNORMAL LOW (ref >60)
Glucose, Bld: 82 mg/dL (ref 70–99)
Potassium: 3.9 mmol/L (ref 3.5–5.1)
Sodium: 136 mmol/L (ref 135–145)
Total Bilirubin: 2 mg/dL — ABNORMAL HIGH (ref 0.3–1.2)
Total Protein: 6.7 g/dL (ref 6.5–8.1)

## 2020-11-30 LAB — CBC
HCT: 39.3 % (ref 36.0–46.0)
Hemoglobin: 13.3 g/dL (ref 12.0–15.0)
MCH: 33.8 pg (ref 26.0–34.0)
MCHC: 33.8 g/dL (ref 30.0–36.0)
MCV: 99.7 fL (ref 80.0–100.0)
Platelets: 187 10*3/uL (ref 150–400)
RBC: 3.94 MIL/uL (ref 3.87–5.11)
RDW: 13.5 % (ref 11.5–15.5)
WBC: 6.5 10*3/uL (ref 4.0–10.5)
nRBC: 0 % (ref 0.0–0.2)

## 2020-11-30 LAB — MAGNESIUM: Magnesium: 1.7 mg/dL (ref 1.7–2.4)

## 2020-11-30 LAB — PHOSPHORUS: Phosphorus: 3.5 mg/dL (ref 2.5–4.6)

## 2020-11-30 MED ORDER — MAGNESIUM SULFATE 2 GM/50ML IV SOLN
2.0000 g | Freq: Once | INTRAVENOUS | Status: AC
  Administered 2020-11-30: 2 g via INTRAVENOUS
  Filled 2020-11-30: qty 50

## 2020-11-30 MED ORDER — ENSURE ENLIVE PO LIQD
237.0000 mL | Freq: Three times a day (TID) | ORAL | Status: DC
  Filled 2020-11-30: qty 237

## 2020-11-30 MED ORDER — AMLODIPINE BESYLATE 10 MG PO TABS
10.0000 mg | ORAL_TABLET | Freq: Every day | ORAL | Status: DC
  Administered 2020-11-30 – 2020-12-03 (×4): 10 mg via ORAL
  Filled 2020-11-30 (×4): qty 1

## 2020-11-30 MED ORDER — ALBUTEROL SULFATE HFA 108 (90 BASE) MCG/ACT IN AERS: 1.0000 | INHALATION_SPRAY | RESPIRATORY_TRACT | Status: DC | PRN

## 2020-11-30 MED ORDER — RIVAROXABAN 10 MG PO TABS
10.0000 mg | ORAL_TABLET | Freq: Every day | ORAL | Status: DC
  Administered 2020-12-01 – 2020-12-03 (×3): 10 mg via ORAL
  Filled 2020-11-30 (×3): qty 1

## 2020-11-30 NOTE — Progress Notes (Signed)
Occupational Therapy Evaluation Patient Details Name: Crystal Noble MRN: [id number] DOB: 1945/09/01 Today's Date: 11/30/2020    History of Present Illness 75 y.o. female admitted on 11/29/20 for fall with stable acetabluar fx.  Non-operative management and NWB for 6-8 weeks.  Pt with significant PMH of carotid stenosis, colitis, HTN, and stroke.   Clinical Impression   Crystal Noble was evaluated s/p the above fall with associated R fx. PTA pt was indep in all ADL/IADLs including driving, pt does report that she often does not have enough energy to cook and clean like she once did. Upon evaluation, pt was min A to boost into standing, and mod to stand pivot to the bed. Assist levels for ADLs are listed below. Pt benefits from continued OT acutely to progress function in all ADLs and mobility. Recommend SNF to continue to progress function prior to d/c home.     Follow Up Recommendations  SNF;Supervision/Assistance - 24 hour    Equipment Recommendations  Other (comment) (defer to next venue)       Precautions / Restrictions Precautions Precautions: Fall Restrictions Weight Bearing Restrictions: Yes RLE Weight Bearing: Non weight bearing      Mobility Bed Mobility Overal bed mobility: Needs Assistance Bed Mobility: Sit to Supine     Supine to sit: Min assist;HOB elevated Sit to supine: Mod assist;HOB elevated   General bed mobility comments: Mod A to manage BLE back into bed; pt able to scoot and boost in bed with vc for NWB on RLE    Transfers Overall transfer level: Needs assistance Equipment used: 2 person hand held assist Transfers: Sit to/from [facility] Sit to Stand: Min assist Stand pivot transfers: Mod assist       General transfer comment: mod assist for stand pivot this session - likely due to pain level. Pt able to maintin NWB status well throughout    Balance Overall balance assessment: Needs assistance Sitting-balance support: Feet  supported;Bilateral upper extremity supported Sitting balance-Leahy Scale: Fair     Standing balance support: Bilateral upper extremity supported Standing balance-Leahy Scale: Poor Standing balance comment: needs support from RW and therapist.           ADL either performed or assessed with clinical judgement   ADL Overall ADL's : Needs assistance/impaired Eating/Feeding: Independent;Sitting   Grooming: Wash/dry hands;Wash/dry face;Oral care;Applying deodorant;Brushing hair;Set up;Sitting   Upper Body Bathing: Set up;Supervision/ safety;Sitting   Lower Body Bathing: Maximal assistance;Sit to/from stand   Upper Body Dressing : Set up;Sitting   Lower Body Dressing: Maximal assistance;Sit to/from stand   Toilet Transfer: Maximal assistance;Stand-pivot;BSC   Toileting- Clothing Manipulation and Hygiene: Sitting/lateral lean;Minimal assistance       Functional mobility during ADLs: Maximal assistance General ADL Comments: LB assistance levels due to NWB status and assitance needed for boosting from sit<>stand - generalized weakness throguhout & pt fatigues quickly      Pertinent Vitals/Pain Pain Assessment: 0-10 Pain Score: 6  Pain Location: R hip / leg Pain Descriptors / Indicators: Discomfort;Grimacing;Guarding Pain Intervention(s): Limited activity within patient's tolerance;Monitored during session     Hand Dominance Right   Extremity/Trunk Assessment Upper Extremity Assessment Upper Extremity Assessment: Generalized weakness   Lower Extremity Assessment Lower Extremity Assessment: Defer to PT evaluation RLE Deficits / Details: right leg hip strength limited by pain with movement.  ankle at least 3/5, knee 3-/5, hip 2/5 per gross and bed level/EOB functional assessment.   Cervical / Trunk Assessment Cervical / Trunk Assessment: Normal   Communication Communication  Communication: No difficulties   Cognition Arousal/Alertness: Awake/alert Behavior During  Therapy: Flat affect Overall Cognitive Status: Impaired/Different from baseline Area of Impairment: Memory                     Memory: Decreased short-term memory         General Comments: pt seemingly intact this session - continues to deny awareness of how she fell; oriented x4   General Comments  Pt agreeable to short rehab stay post acute to gain indep in functional transfers and safety    Exercises Exercises: General Lower Extremity General Exercises - Lower Extremity Ankle Circles/Pumps: AROM;Both;20 reps (encouraged hourly for antiembolic purposes)    Home Living Family/patient expects to be discharged to:: Private residence Living Arrangements: Alone   Type of Home: House Home Access: Stairs to enter Technical brewer of Steps: 6 Entrance Stairs-Rails: Right;Left;Can reach both Home Layout: One level     Bathroom Shower/Tub: Occupational psychologist: [place]: Hand held shower head          Prior Functioning/Environment Level of Independence: Independent        Comments: pt drives, does not work, does not have a lot of energy to cook/clean but does what she can        OT Problem List: Decreased strength;Decreased range of motion;Decreased activity tolerance;Impaired balance (sitting and/or standing);Decreased safety awareness;Decreased knowledge of use of DME or AE;Decreased knowledge of precautions;Pain      OT Treatment/Interventions: Self-care/ADL training;Therapeutic exercise;DME and/or AE instruction;Therapeutic activities;Patient/family education;Balance training    OT Goals(Current goals can be found in the care plan section) Acute Rehab OT Goals Patient Stated Goal: none stated, but agreeable to SNF rehab OT Goal Formulation: With patient Time For Goal Achievement: 12/14/20 Potential to Achieve Goals: Fair ADL Goals Pt Will Perform Lower Body Bathing: with min guard assist;sitting/lateral leans;with  adaptive equipment Pt Will Perform Lower Body Dressing: with min guard assist;sitting/lateral leans;with adaptive equipment Pt Will Transfer to Toilet: with min guard assist;stand pivot transfer;bedside commode Pt Will Perform Toileting - Clothing Manipulation and hygiene: with min guard assist;sitting/lateral leans Pt Will Perform Tub/Shower Transfer: with min assist;Stand pivot transfer;grab bars;rolling walker  OT Frequency: Min 2X/week   Barriers to D/C: Decreased caregiver support  pt does not have support at home          AM-PAC OT "6 Clicks" Daily Activity     Outcome Measure Help from another person eating meals?: None Help from another person taking care of personal grooming?: A Little Help from another person toileting, which includes using toliet, bedpan, or urinal?: A Lot Help from another person bathing (including washing, rinsing, drying)?: A Lot Help from another person to put on and taking off regular upper body clothing?: A Little Help from another person to put on and taking off regular lower body clothing?: A Lot 6 Click Score: 16   End of Session Equipment Utilized During Treatment: Gait belt Nurse Communication: Mobility status;Patient requests pain meds  Activity Tolerance: Patient tolerated treatment well Patient left: in bed;with call bell/phone within reach;with bed alarm set  OT Visit Diagnosis: Other abnormalities of gait and mobility (R26.89);History of falling (Z91.81);Muscle weakness (generalized) (M62.81);Pain Pain - Right/Left: Right Pain - part of body: Hip;Leg                Time: 5852-7782 OT Time Calculation (min): 19 min Charges:  OT General Charges $OT Visit: 1 Visit OT Evaluation $  OT Eval Moderate Complexity: 1 Mod   Lew Prout A Anahla Bevis 11/30/2020, 4:44 PM

## 2020-11-30 NOTE — TOC Initial Note (Signed)
Transition of Care [facility]) - Initial/Assessment Note    Patient Details  Name: Crystal Noble MRN: [id number] Date of Birth: 1946-02-17  [facility] CM/SW Contact:    Sharin Mons, RN Phone Number: 11/30/2020, 3:21 PM  Clinical Narrative:  Admitted after falling, suffered acetabluar fx. From home alone. PTA independent with ADL's, no DME usage.             NCM received consult for possible SNF placement at time of discharge. RNCM spoke with patient regarding PT recommendation of SNF placement at time of discharge. Patient reported that she is currently unable to care for self at home independently given current physical needs and fall risk. Patient expressed understanding of PT recommendation and is agreeable to SNF placement at time of discharge. Patient without preference for  SNF. NCM discussed insurance authorization process and provided Medicare SNF ratings list. Patient expressed being hopeful for rehab and to feel better soon. No further questions reported at this time. NCM to continue to follow and assist with discharge planning needs.   COVID vaccinated x2.   Expected Discharge Plan: Skilled Nursing Facility Barriers to Discharge: Continued Medical Work up   Patient Goals and CMS Choice   CMS Medicare.gov Compare Post Acute Care list provided to:: Patient    Expected Discharge Plan and Services Expected Discharge Plan: [place]       Living arrangements for the past 2 months: Single Family Home                                      Prior Living Arrangements/Services Living arrangements for the past 2 months: Single Family Home Lives with:: Self Patient language and need for interpreter reviewed:: Yes Do you feel safe going back to the place where you live?: Yes      Need for Family Participation in Patient Care: Yes (Comment) Care giver support system in place?: No (comment)   Criminal Activity/Legal Involvement  Pertinent to Current Situation/Hospitalization: No - Comment as needed  Activities of Daily Living      Permission Sought/Granted                  Emotional Assessment Appearance:: Appears stated age Attitude/Demeanor/Rapport: Engaged Affect (typically observed): Accepting Orientation: : Oriented to Self, Oriented to Place, Oriented to  Time, Oriented to Situation Alcohol / Substance Use: Not Applicable Psych Involvement: No (comment)  Admission diagnosis:  Hip fracture ([place]) [S72.009A] Closed nondisplaced fracture of anterior wall of right acetabulum, initial encounter ([place]) [S32.414A] Fall, initial encounter [W19.XXXA] Patient Active Problem List   Diagnosis Date Noted   Mild cognitive impairment 11/30/2020   Right acetabular fracture ([place]) 11/29/2020   Hypokalemia 06/26/2020   Hypomagnesemia 06/26/2020   Alcohol abuse 06/26/2020   Anxiety disorder 06/26/2020   COPD (chronic obstructive pulmonary disease) ([place]) 06/26/2020   Falls 06/26/2020   Multiple falls    AKI (acute kidney injury) ([place]) 06/25/2020   PAD (peripheral artery disease) ([place]) 08/14/2019   Mixed hyperlipidemia 08/14/2019   Exertional dyspnea 05/13/2019   Coronary artery disease involving native coronary artery of native heart without angina pectoris 03/27/2019   Aortic atherosclerosis ([place]) 03/27/2019   Tobacco abuse 03/27/2019   Essential hypertension 03/27/2019   Asymptomatic carotid artery stenosis without infarction 03/27/2014   PCP:  Fanny Bien, MD Pharmacy:   [facility] [place] - [address]  [address] [place] [address] Phone: [phone] Fax: [phone]     Social Determinants of Health (SDOH) Interventions    Readmission Risk Interventions No flowsheet data found.

## 2020-11-30 NOTE — Progress Notes (Signed)
   Subjective:   Crystal Noble states she remembers cooking dinner at 5/6pm and that's the last thing she remembers. Next thing, woke up in bed. Does not remember getting there. When she woke up, she could not bear weight and crawled to the bathroom to call 911.   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 She cannot recall if she takes Plavix.   Hx of getting lost since stroke. Only a few cousins in the area, but no adequate family support.  Lack of appetite lately and feeting too fatigued to cook.   Objective:  Vital signs in last 24 hours: Vitals:   11/30/20 0155 11/30/20 0320 11/30/20 0759 11/30/20 0802  BP: (!) 167/72   129/76  Pulse: 81   76  Resp: 19   17  Temp: 98.1 F (36.7 C)   97.8 F (36.6 C)  TempSrc: Oral   Oral  SpO2: 99% 96% 96% 97%  Weight:      Height:       Physical exam: General: Frail-appearing elderly female, lying in bed, NAD. CV: Normal rate and regular rhythm, no murmurs rubs or gallops. Pulm: Clear to auscultation bilaterally, normal pulmonary effort, no adventitious sounds noted. Abdomen: Flat abdomen, soft, nontender, normoactive bowel sounds. MSK: Shortened right leg compared to left, tenderness with movement.  No peripheral edema noted. Skin: Warm and dry. Neuro: AAO x3, no focal deficits noted. Psych: Calm and cooperative.  Assessment/Plan:  Principal Problem:   Right acetabular fracture (HCC) Active Problems:   Essential hypertension   Falls   Mild cognitive impairment  Right nondisplaced anterior column acetabular fracture Occurred secondary to a fall at home while making dinner.  She does not remember the events nor how she made it to bed, but awoke the next morning with inability to bear weight on her right leg due to pain.  Orthopedics consulted, no operative management indicated.  Per Ortho, patient's fracture should heal on its own and patient should remain nonweightbearing on right leg/hip.  Patient may require  wheelchair for the next 6 to 8 weeks.  Patient would likely benefit from subacute rehabilitation at discharge given no family support at home. -Pain management with p.o. Dilaudid 1 mg every 4 as needed and IV fentanyl 12.5 mcg every 4 hours as needed for breakthrough -Subcutaneous heparin for DVT prophylaxis -PT eval  Hypertension Restarted patient's home Norvasc 10 mg daily.  Patient's blood pressures initially elevated, likely secondary to pain from acetabular fracture.  Blood pressures have been steadily decreasing with most current reading at 129/76. -Pain management as above -Continue Norvasc 10 mg daily  Mild cognitive impairment likely 2/2 chronic ischemic vascular disease History of CVA Patient with history of cerebral infarction.  States that she began having symptoms of memory loss after her stroke. -On Plavix 75 mg daily and rosuvastatin 10 mg daily  Malnutrition Consult placed to registered dietitian.  Started on regular diet. -Folic acid, thiamine, multivitamins  Prior to Admission Living Arrangement: Home Anticipated Discharge Location: TBD Barriers to Discharge: Continued medical management Dispo: Anticipated discharge in approximately 1-2 day(s).   Virl Axe, MD 11/30/2020, 2:17 PM Pager: [phone] After 5pm on weekdays and 1pm on weekends: On Call pager [phone]

## 2020-11-30 NOTE — ED Notes (Signed)
Report attempted rn to attempt again  

## 2020-11-30 NOTE — Evaluation (Signed)
Physical Therapy Evaluation Patient Details Name: Crystal Noble MRN: [id number] DOB: Oct 28, 1945 Today's Date: 11/30/2020   History of Present Illness  75 y.o. female admitted on 11/29/20 for fall with stable acetabluar fx.  Non-operative management and NWB for 6-8 weeks.  Pt with significant PMH of carotid stenosis, colitis, HTN, and stroke.  Clinical Impression  Pt with limited social support and difficulty with NWB during transfer OOB to chair.  She is not heavy assist, but would struggle at home alone to function well from a WC level.  SNF level rehab is appropriate at d/c.   PT to follow acutely for deficits listed below.     Follow Up Recommendations SNF    Equipment Recommendations  Rolling walker with 5" wheels;Wheelchair (measurements PT);Wheelchair cushion (measurements PT);3in1 (PT)    Recommendations for Other Services       Precautions / Restrictions Precautions Precautions: Fall Restrictions Weight Bearing Restrictions: Yes RLE Weight Bearing: Non weight bearing      Mobility  Bed Mobility Overal bed mobility: Needs Assistance Bed Mobility: Supine to Sit     Supine to sit: Min assist;HOB elevated     General bed mobility comments: Min assist to help progress both legs to EOB (hurts on R side to lift left leg), cues for hand placement and sequencing and slow speed to attempt to avoid pain.  Min assist at trunk to come up to sitting with bil hand supported on bed rail and HOB elevated maximally.    Transfers Overall transfer level: Needs assistance Equipment used: Right platform walker Transfers: Sit to/from Stand;Stand Pivot Transfers Sit to Stand: Min assist Stand pivot transfers: Min assist       General transfer comment: Min assist to come to standing to RW, able to maintain NWB throughout stand and transfer except one moment of LOB, light touch down on her right side.  Min assist to turn and help maneuver RW.  Transfered to left (stronger)  side.  Ambulation/Gait                Stairs            Wheelchair Mobility    Modified Rankin (Stroke Patients Only)       Balance Overall balance assessment: Needs assistance Sitting-balance support: Feet supported;Bilateral upper extremity supported Sitting balance-Leahy Scale: Fair     Standing balance support: Bilateral upper extremity supported Standing balance-Leahy Scale: Poor Standing balance comment: needs support from RW and therapist.                             Pertinent Vitals/Pain Pain Assessment: No/denies pain    Home Living Family/patient expects to be discharged to:: Private residence Living Arrangements: Alone   Type of Home: House Home Access: Stairs to enter Entrance Stairs-Rails: Right;Left;Can reach both Entrance Stairs-Number of Steps: 6 Home Layout: One level Home Equipment: Hand held shower head      Prior Function Level of Independence: Independent         Comments: neighbor's cat comes over to visit, did not mention any hobbies when asked, or things she enjoys doing.  Per RN she struggles to have the energy to cook for herself.     Hand Dominance   Dominant Hand: Right    Extremity/Trunk Assessment   Upper Extremity Assessment Upper Extremity Assessment: Defer to OT evaluation    Lower Extremity Assessment Lower Extremity Assessment: RLE deficits/detail RLE Deficits / Details: right leg  hip strength limited by pain with movement.  ankle at least 3/5, knee 3-/5, hip 2/5 per gross and bed level/EOB functional assessment.    Cervical / Trunk Assessment Cervical / Trunk Assessment: Normal  Communication   Communication: No difficulties  Cognition Arousal/Alertness: Awake/alert Behavior During Therapy: Flat affect (very flat) Overall Cognitive Status: Impaired/Different from baseline Area of Impairment: Memory                     Memory: Decreased short-term memory         General  Comments: Pt reports she has no recollection of the fall.  She reports she fell in the kitchen, but does not know if she passed out or tripped and does not know how long she was on the floor. Unsure of cognitive baseline.  Seemingly intact otherwise.      General Comments General comments (skin integrity, edema, etc.): Spoke with pt re: NWB status and need to be WC level for 6-8 weeks per physician note.  Educated re: recommendation of SNF for rehab so that she can have increased help during this time.  She is agreeable to this plan.    Exercises General Exercises - Lower Extremity Ankle Circles/Pumps: AROM;Both;20 reps (encouraged hourly for antiembolic purposes)   Assessment/Plan    PT Assessment Patient needs continued PT services  PT Problem List Decreased strength;Decreased range of motion;Decreased activity tolerance;Decreased balance;Decreased mobility;Decreased cognition;Decreased knowledge of use of DME;Pain       PT Treatment Interventions Gait training;DME instruction;Stair training;Functional mobility training;Therapeutic activities;Therapeutic exercise;Balance training;Cognitive remediation;Patient/family education;Wheelchair mobility training;Modalities    PT Goals (Current goals can be found in the Care Plan section)  Acute Rehab PT Goals Patient Stated Goal: none stated, but agreeable to SNF rehab PT Goal Formulation: With patient Time For Goal Achievement: 12/14/20 Potential to Achieve Goals: Good    Frequency Min 3X/week   Barriers to discharge Inaccessible home environment;Decreased caregiver support 6 STE and no family reported to support her at d/c    Co-evaluation               AM-PAC PT "6 Clicks" Mobility  Outcome Measure Help needed turning from your back to your side while in a flat bed without using bedrails?: A Little Help needed moving from lying on your back to sitting on the side of a flat bed without using bedrails?: A Little Help needed  moving to and from a bed to a chair (including a wheelchair)?: A Little Help needed standing up from a chair using your arms (e.g., wheelchair or bedside chair)?: A Little Help needed to walk in hospital room?: Total Help needed climbing 3-5 steps with a railing? : Total 6 Click Score: 14    End of Session Equipment Utilized During Treatment: Gait belt Activity Tolerance: Patient limited by pain Patient left: in chair;with call bell/phone within reach;with chair alarm set Nurse Communication: Mobility status;Weight bearing status PT Visit Diagnosis: Muscle weakness (generalized) (M62.81);Difficulty in walking, not elsewhere classified (R26.2);Pain Pain - Right/Left: Right Pain - part of body: Hip    Time: 1440-1507 PT Time Calculation (min) (ACUTE ONLY): 27 min   Charges:   PT Evaluation $PT Eval Moderate Complexity: 1 Mod PT Treatments $Therapeutic Activity: 8-22 mins      Verdene Lennert, PT, DPT  Acute Rehabilitation Ortho Tech Supervisor [phone] pager [phone]) [phone] office

## 2020-11-30 NOTE — Progress Notes (Signed)
Nutrition Follow-up  DOCUMENTATION CODES:   Underweight, Severe malnutrition in context of chronic illness  INTERVENTION:   -Ensure Enlive po TID, each supplement provides 350 kcal and 20 grams of protein  -MVI with minerals daily  NUTRITION DIAGNOSIS:   Severe Malnutrition related to chronic illness (COPD) as evidenced by moderate fat depletion, severe fat depletion, moderate muscle depletion, severe muscle depletion.  GOAL:   Patient will meet greater than or equal to 90% of their needs  MONITOR:   PO intake, Supplement acceptance, Labs, Weight trends, Skin, I & O's  REASON FOR ASSESSMENT:   Consult Assessment of nutrition requirement/status, Poor PO  ASSESSMENT:   Crystal Noble is a 75 y.o. female with a pertinent PMH of carotid stenosis, PAD, CAD, HTN, CVA (06/2019), COPD, tobacco use disorder, alcohol use disorder, who presents to [facility] after a fall.  Pt admitted with rt hip fracture s/p fall.   Spoke with pt at bedside, who reports feeling hungry. She shares that she has had decreased appetite over the past 3 months, consuming 40% less than what she usually does. She typically consumes 3 meals per day (Breakfast: eggs and toast; Lunch: sandwich; Dinner: chicken and beans). Pt shares she consumes mostly water, but also consumes 2 glasses of wine daily. She denies any changes in her alcohol intake.   Per pt, her UBW is around 90#. She endorses wt loss, but unsure how much. Reviewed wt hx; wt has been stable over the past 4 months.   Discussed importance of good meal and supplement intake to promote healing. Pt amenable to supplements. Also provided pt with additional blanket for comfort.   Medications reviewed and include folic acid, miralax and thiamine.   Labs reviewed.   NUTRITION - FOCUSED PHYSICAL EXAM:  Flowsheet Row Most Recent Value  Orbital Region Moderate depletion  Upper Arm Region Severe depletion  Thoracic and Lumbar Region Severe depletion   Buccal Region Moderate depletion  Temple Region Moderate depletion  Clavicle Bone Region Severe depletion  Clavicle and Acromion Bone Region Severe depletion  Scapular Bone Region Severe depletion  Dorsal Hand Moderate depletion  Patellar Region Severe depletion  Anterior Thigh Region Severe depletion  Posterior Calf Region Severe depletion  Edema (RD Assessment) None  Hair Reviewed  Eyes Reviewed  Mouth Reviewed  Skin Reviewed  Nails Reviewed       Diet Order:   Diet Order             Diet Heart Room service appropriate? Yes; Fluid consistency: Thin  Diet effective now                   EDUCATION NEEDS:   Education needs have been addressed  Skin:  Skin Assessment: Reviewed RN Assessment  Last BM:  11/28/20  Height:   Ht Readings from Last 1 Encounters:  11/29/20 5' (1.524 m)    Weight:   Wt Readings from Last 1 Encounters:  11/29/20 35.5 kg    Ideal Body Weight:  45.5 kg  BMI:  Body mass index is 15.28 kg/m.  Estimated Nutritional Needs:   Kcal:  1450-1650  Protein:  70-85 grams  Fluid:  > 1.4 L    Loistine Chance, RD, LDN, [place] Registered Dietitian II Certified Diabetes Care and Education Specialist Please refer to [facility] for RD and/or RD on-call/weekend/after hours pager

## 2020-11-30 NOTE — Consult Note (Addendum)
Reason for Consult:Right acetabular fracture Referring Physician: Evette Doffing MD  [place] Noble is an 75 y.o. female.  HPI: 75 yo female presents with suspected fall and acute right hip pain. Patient unable to ambulate on her painful hip. She denies other complaints. Patient has numerous medical issues and is being worked up for suspected syncopal episode causing the fall. She is at baseline and independent household ambulator. Imaging in the ED shows a right acetabular fracture. Ortho consulted and patient requestin our group.   Past Medical History:  Diagnosis Date   Carotid stenosis, asymptomatic    Colitis, collagenous    Hypertension    Stroke ([place]) 06/2019    Past Surgical History:  Procedure Laterality Date   APPENDECTOMY      Family History  Problem Relation Age of Onset   Hypertension Mother    Hyperlipidemia Mother    Heart attack Mother    Depression Mother        Bi-Polar   Cancer Father        Lung    Social History:  reports that she has been smoking cigarettes. She has a 15.00 pack-year smoking history. She has never used smokeless tobacco. She reports current alcohol use of about 21.0 standard drinks of alcohol per week. She reports that she does not use drugs.  Allergies:  Allergies  Allergen Reactions   Penicillin G Rash    Medications: I have reviewed the patient's current medications.  Results for orders placed or performed during the hospital encounter of 11/29/20 (from the past 48 hour(s))  Comprehensive metabolic panel     Status: Abnormal   Collection Time: 11/29/20 12:26 PM  Result Value Ref Range   Sodium 137 135 - 145 mmol/L   Potassium 4.3 3.5 - 5.1 mmol/L   Chloride 101 98 - 111 mmol/L   CO2 19 (L) 22 - 32 mmol/L   Glucose, Bld 87 70 - 99 mg/dL    Comment: Glucose reference range applies only to samples taken after fasting for at least 8 hours.   BUN 22 8 - 23 mg/dL   Creatinine, Ser 1.06 (H) 0.44 - 1.00 mg/dL   Calcium 9.8 8.9 - 10.3  mg/dL   Total Protein 7.4 6.5 - 8.1 g/dL   Albumin 4.3 3.5 - 5.0 g/dL   AST 30 15 - 41 U/L   ALT 24 0 - 44 U/L   Alkaline Phosphatase 62 38 - 126 U/L   Total Bilirubin 2.3 (H) 0.3 - 1.2 mg/dL   GFR, Estimated 55 (L) >60 mL/min    Comment: (NOTE) Calculated using the CKD-EPI Creatinine Equation (2021)    Anion gap 17 (H) 5 - 15    Comment: Performed at [facility] [address] [place], [place] [address]  Ethanol     Status: None   Collection Time: 11/29/20 12:26 PM  Result Value Ref Range   Alcohol, Ethyl (B) <10 <10 mg/dL    Comment: (NOTE) Lowest detectable limit for serum alcohol is 10 mg/dL.  For medical purposes only. Performed at [facility], [place] [address], [place], [place] [address]   Troponin I (High Sensitivity)     Status: None   Collection Time: 11/29/20 12:26 PM  Result Value Ref Range   Troponin I (High Sensitivity) 9 <18 ng/L    Comment: (NOTE) Elevated high sensitivity troponin I (hsTnI) values and significant  changes across serial measurements may suggest ACS but many other  chronic and acute conditions  are known to elevate hsTnI results.  Refer to the "Links" section for chest pain algorithms and additional  guidance. Performed at [facility], [place] [address] [place], [place] [address]   CBC with Differential     Status: Abnormal   Collection Time: 11/29/20 12:26 PM  Result Value Ref Range   WBC 8.7 4.0 - 10.5 K/uL   RBC 4.07 3.87 - 5.11 MIL/uL   Hemoglobin 13.8 12.0 - 15.0 g/dL   HCT 41.9 36.0 - 46.0 %   MCV 102.9 (H) 80.0 - 100.0 fL   MCH 33.9 26.0 - 34.0 pg   MCHC 32.9 30.0 - 36.0 g/dL   RDW 13.8 11.5 - 15.5 %   Platelets 200 150 - 400 K/uL   nRBC 0.0 0.0 - 0.2 %   Neutrophils Relative % 82 %   Neutro Abs 7.2 1.7 - 7.7 K/uL   Lymphocytes Relative 8 %   Lymphs Abs 0.7 0.7 - 4.0 K/uL   Monocytes Relative 9 %   Monocytes Absolute 0.8 0.1 - 1.0 K/uL   Eosinophils Relative 0 %   Eosinophils Absolute 0.0 0.0 - 0.5 K/uL    Basophils Relative 0 %   Basophils Absolute 0.0 0.0 - 0.1 K/uL   Immature Granulocytes 1 %   Abs Immature Granulocytes 0.04 0.00 - 0.07 K/uL    Comment: Performed at [facility] [address] [place], [place] [address]  Protime-INR     Status: None   Collection Time: 11/29/20 12:26 PM  Result Value Ref Range   Prothrombin Time 12.9 11.4 - 15.2 seconds   INR 1.0 0.8 - 1.2    Comment: (NOTE) INR goal varies based on device and disease states. Performed at [facility], [place] [address], [place], [place] [address]   Resp Panel by RT-PCR (Flu A&B, Covid) Nasopharyngeal Swab     Status: None   Collection Time: 11/29/20  1:02 PM   Specimen: Nasopharyngeal Swab; Nasopharyngeal(NP) swabs in vial transport medium  Result Value Ref Range   SARS Coronavirus 2 by RT PCR NEGATIVE NEGATIVE    Comment: (NOTE) SARS-CoV-2 target nucleic acids are NOT DETECTED.  The SARS-CoV-2 RNA is generally detectable in upper respiratory specimens during the acute phase of infection. The lowest concentration of SARS-CoV-2 viral copies this assay can detect is 138 copies/mL. A negative result does not preclude SARS-Cov-2 infection and should not be used as the sole basis for treatment or other patient management decisions. A negative result may occur with  improper specimen collection/handling, submission of specimen other than nasopharyngeal swab, presence of viral mutation(s) within the areas targeted by this assay, and inadequate number of viral copies(<138 copies/mL). A negative result must be combined with clinical observations, patient history, and epidemiological information. The expected result is Negative.  Fact Sheet for Patients:  EntrepreneurPulse.com.au  Fact Sheet for Healthcare Providers:  IncredibleEmployment.be  This test is no t yet approved or cleared by the [place] FDA and  has been authorized for detection and/or diagnosis of  SARS-CoV-2 by FDA under an Emergency Use Authorization (EUA). This EUA will remain  in effect (meaning this test can be used) for the duration of the COVID-19 declaration under Section 564(b)(1) of the Act, 21 U.S.C.section 360bbb-3(b)(1), unless the authorization is terminated  or revoked sooner.       Influenza A by PCR NEGATIVE NEGATIVE   Influenza B by PCR NEGATIVE NEGATIVE    Comment: (NOTE) The Xpert Xpress SARS-CoV-2/FLU/RSV plus  assay is intended as an aid in the diagnosis of influenza from Nasopharyngeal swab specimens and should not be used as a sole basis for treatment. Nasal washings and aspirates are unacceptable for Xpert Xpress SARS-CoV-2/FLU/RSV testing.  Fact Sheet for Patients: EntrepreneurPulse.com.au  Fact Sheet for Healthcare Providers: IncredibleEmployment.be  This test is not yet approved or cleared by the [place] FDA and has been authorized for detection and/or diagnosis of SARS-CoV-2 by FDA under an Emergency Use Authorization (EUA). This EUA will remain in effect (meaning this test can be used) for the duration of the COVID-19 declaration under Section 564(b)(1) of the Act, 21 U.S.C. section 360bbb-3(b)(1), unless the authorization is terminated or revoked.  Performed at [facility], [place] [address] [place], [place] [address]   Troponin I (High Sensitivity)     Status: None   Collection Time: 11/29/20  2:26 PM  Result Value Ref Range   Troponin I (High Sensitivity) 14 <18 ng/L    Comment: (NOTE) Elevated high sensitivity troponin I (hsTnI) values and significant  changes across serial measurements may suggest ACS but many other  chronic and acute conditions are known to elevate hsTnI results.  Refer to the "Links" section for chest pain algorithms and additional  guidance. Performed at [facility], [place] [address], [place], [place] [address]   Magnesium     Status: None   Collection Time:  11/30/20  2:09 AM  Result Value Ref Range   Magnesium 1.7 1.7 - 2.4 mg/dL    Comment: Performed at [facility] [address], [place], [place] [address]  Phosphorus     Status: None   Collection Time: 11/30/20  2:09 AM  Result Value Ref Range   Phosphorus 3.5 2.5 - 4.6 mg/dL    Comment: Performed at [facility] [address], [place],  [address]  Comprehensive metabolic panel     Status: Abnormal   Collection Time: 11/30/20  2:09 AM  Result Value Ref Range   Sodium 136 135 - 145 mmol/L   Potassium 3.9 3.5 - 5.1 mmol/L   Chloride 100 98 - 111 mmol/L   CO2 22 22 - 32 mmol/L   Glucose, Bld 82 70 - 99 mg/dL    Comment: Glucose reference range applies only to samples taken after fasting for at least 8 hours.   BUN 21 8 - 23 mg/dL   Creatinine, Ser 1.08 (H) 0.44 - 1.00 mg/dL   Calcium 9.6 8.9 - 10.3 mg/dL   Total Protein 6.7 6.5 - 8.1 g/dL   Albumin 3.9 3.5 - 5.0 g/dL   AST 25 15 - 41 U/L   ALT 20 0 - 44 U/L   Alkaline Phosphatase 60 38 - 126 U/L   Total Bilirubin 2.0 (H) 0.3 - 1.2 mg/dL   GFR, Estimated 54 (L) >60 mL/min    Comment: (NOTE) Calculated using the CKD-EPI Creatinine Equation (2021)    Anion gap 14 5 - 15    Comment: Performed at [facility] [address] [place] [address]  CBC     Status: None   Collection Time: 11/30/20  2:09 AM  Result Value Ref Range   WBC 6.5 4.0 - 10.5 K/uL   RBC 3.94 3.87 - 5.11 MIL/uL   Hemoglobin 13.3 12.0 - 15.0 g/dL   HCT 39.3 36.0 - 46.0 %   MCV 99.7 80.0 - 100.0 fL   MCH 33.8 26.0 - 34.0 pg  MCHC 33.8 30.0 - 36.0 g/dL   RDW 13.5 11.5 - 15.5 %   Platelets 187 150 - 400 K/uL   nRBC 0.0 0.0 - 0.2 %    Comment: Performed at [facility], [place] [address], [place], [place] [address]    CT Head Wo Contrast  Result Date: 11/29/2020 CLINICAL DATA:  Head trauma, minor. EXAM: CT HEAD WITHOUT CONTRAST TECHNIQUE: Contiguous axial images were obtained from the base of the skull through the  vertex without intravenous contrast. COMPARISON:  Prior head CT 06/25/2020. FINDINGS: Brain: Moderate cerebral atrophy. Advanced patchy and confluent hypoattenuation within the cerebral white matter, nonspecific but compatible with chronic small vessel ischemic disease. There is no acute intracranial hemorrhage. No demarcated cortical infarct. No extra-axial fluid collection. No evidence of intracranial mass. No midline shift. Vascular: No hyperdense vessel.  Atherosclerotic calcifications. Skull: Normal. Negative for fracture or focal lesion. Sinuses/Orbits: Visualized orbits show no acute finding. No significant paranasal sinus disease at the imaged levels. IMPRESSION: No evidence of acute intracranial abnormality. Moderate cerebral atrophy and advanced cerebral white matter chronic small vessel ischemic disease, stable. Electronically Signed   By: Kellie Simmering DO   On: 11/29/2020 13:47   MR BRAIN WO CONTRAST  Result Date: 11/29/2020 CLINICAL DATA:  Memory loss and fall EXAM: MRI HEAD WITHOUT CONTRAST TECHNIQUE: Multiplanar, multiecho pulse sequences of the brain and surrounding structures were obtained without intravenous contrast. COMPARISON:  06/18/2020 FINDINGS: Brain: No acute infarct, mass effect or extra-axial collection. Punctate focus of DWI hyperintensity at the posterior right basal ganglia is unchanged since 06/18/2020. No acute or chronic hemorrhage. Normal white matter signal, parenchymal volume and CSF spaces. The midline structures are normal. Vascular: Major flow voids are preserved. Skull and upper cervical spine: Normal calvarium and skull base. Visualized upper cervical spine and soft tissues are normal. Sinuses/Orbits:No paranasal sinus fluid levels or advanced mucosal thickening. No mastoid or middle ear effusion. Normal orbits. IMPRESSION: 1. No acute intracranial abnormality. 2. Punctate focus of DWI hyperintensity at the posterior right basal ganglia is unchanged since 06/18/2020. This  may indicate a small focus of chronic ischemia. Electronically Signed   By: Ulyses Jarred M.D.   On: 11/29/2020 22:59   MR HIP RIGHT WO CONTRAST  Result Date: 11/29/2020 CLINICAL DATA:  Severe right hip pain after fall EXAM: MR OF THE RIGHT HIP WITHOUT CONTRAST TECHNIQUE: Multiplanar, multisequence MR imaging was performed. No intravenous contrast was administered. COMPARISON:  X-ray 11/29/2020 FINDINGS: Bones/Joint/Cartilage Acute nondisplaced fracture of the right acetabulum involving the acetabular roof and anterior acetabulum. Associated bone marrow edema. No articular surface depression or diastasis. The proximal right femur is intact without fracture or dislocation. Small right hip joint effusion. No focal cartilage defect. Remainder of the bony pelvis is intact. No additional fracture. No pelvic diastasis. Left hip joint intact. Degenerative disc disease within the lower lumbar spine. Ligaments Partial tear of the iliofemoral ligament from its iliac attachment (series 8, image 17). Muscles and Tendons Intramuscular edema about the right hip joint, most pronounced within the adductor compartment. No acute tendinous injury. Soft tissues No fluid collection or hematoma. No inguinal lymphadenopathy. No acute findings are evident within the pelvis. IMPRESSION: 1. Acute nondisplaced fracture of the right acetabulum involving the acetabular roof and anterior acetabulum. Small right hip joint effusion. 2. Partial tear of the iliofemoral ligament from its iliac attachment. Electronically Signed   By: Davina Poke D.O.   On: 11/29/2020 17:02   DG Hip Unilat W or Wo  Pelvis 1 View Right  Result Date: 11/29/2020 CLINICAL DATA:  Fall EXAM: DG HIP (WITH OR WITHOUT PELVIS) 1V RIGHT COMPARISON:  09/25/2017 FINDINGS: There is no evidence of hip fracture or dislocation. There is no evidence of arthropathy or other focal bone abnormality. IMPRESSION: Negative. Electronically Signed   By: Franchot Gallo M.D.   On:  11/29/2020 13:16    Review of Systems Blood pressure 129/76, pulse 76, temperature 97.8 F (36.6 C), temperature source Oral, resp. rate 17, height 5' (1.524 m), weight 35.5 kg, SpO2 97 %. Physical Exam Patient is awake and alert and oriented. Pain free AROM of the neck. Bilateral shoulders not swollen and not tender. Pain free AROM of the shoulders, elbows and wrists with Grade 5 strength bilateral UEs Right hip with mild pain with gentle active flexion but no pain with passive log roll and passive flexion.  Left LE with pain free AROM of the hip knee and ankle. NVI bilateral LEs  Assessment/Plan: Right non-displaced anterior column acetabular fracture. This should be a stable injury that will heal on its own as long as the patient is strict NWB on the right hip/leg. If she is unable or unwilling to keep weight off she may need to be in a wheelchair for 6-8 weeks.  No surgery indicated at this time. D/C NPO, diet ordered. She is at high DVT risk would recommend aggressive prophylaxis.  Pt consult ordered. Will likely need SNF Will follow! thanks  Augustin Schooling 11/30/2020, 11:02 AM

## 2020-11-30 NOTE — TOC Progression Note (Signed)
Transition of Care [facility]) - Progression Note    Patient Details  Name: [place] Noble MRN: [id number] Date of Birth: 1946-01-07  Transition of Care [facility]) CM/SW [place], [place] Phone Number: 11/30/2020, 5:00 PM  Clinical Narrative:    CSW received Passr for pt. [id number]   Expected Discharge Plan: [place] Barriers to Discharge: Continued Medical Work up  Expected Discharge Plan and Services Expected Discharge Plan: [place] arrangements for the past 2 months: Single Family Home                                       Social Determinants of Health (SDOH) Interventions    Readmission Risk Interventions No flowsheet data found.

## 2020-11-30 NOTE — Plan of Care (Signed)

## 2020-11-30 NOTE — ED Notes (Signed)
rn attempted report rn to attempt again .

## 2020-12-01 DIAGNOSIS — E43 Unspecified severe protein-calorie malnutrition: Secondary | ICD-10-CM | POA: Insufficient documentation

## 2020-12-01 LAB — BASIC METABOLIC PANEL
Anion gap: 16 — ABNORMAL HIGH (ref 5–15)
BUN: 25 mg/dL — ABNORMAL HIGH (ref 8–23)
CO2: 23 mmol/L (ref 22–32)
Calcium: 9.7 mg/dL (ref 8.9–10.3)
Chloride: 100 mmol/L (ref 98–111)
Creatinine, Ser: 0.88 mg/dL (ref 0.44–1.00)
GFR, Estimated: >60 mL/min (ref >60)
Glucose, Bld: 114 mg/dL — ABNORMAL HIGH (ref 70–99)
Potassium: 3.9 mmol/L (ref 3.5–5.1)
Sodium: 139 mmol/L (ref 135–145)

## 2020-12-01 LAB — MAGNESIUM: Magnesium: 2.1 mg/dL (ref 1.7–2.4)

## 2020-12-01 LAB — PHOSPHORUS: Phosphorus: 3.1 mg/dL (ref 2.5–4.6)

## 2020-12-01 MED ORDER — HYDROMORPHONE HCL 2 MG PO TABS
1.0000 mg | ORAL_TABLET | ORAL | Status: DC | PRN
  Administered 2020-12-01 – 2020-12-03 (×5): 1 mg via ORAL
  Filled 2020-12-01 (×5): qty 1

## 2020-12-01 MED ORDER — ACETAMINOPHEN 325 MG PO TABS
650.0000 mg | ORAL_TABLET | Freq: Four times a day (QID) | ORAL | Status: DC | PRN
  Administered 2020-12-02: 650 mg via ORAL
  Filled 2020-12-01 (×2): qty 2

## 2020-12-01 NOTE — Progress Notes (Signed)
Orthopedics Progress Note  Subjective: Patient feeling better today with less hip pain  Objective:  Vitals:   12/01/20 0749 12/01/20 0751  BP: 135/67   Pulse: 73   Resp: 16   Temp: 97.6 F (36.4 C)   SpO2: 97% 97%    General: Awake and alert  Musculoskeletal: Right leg with little to no pain with AROM of the hip and knee (improved over yesterday) Neurovascularly intact  Lab Results  Component Value Date   WBC 6.5 11/30/2020   HGB 13.3 11/30/2020   HCT 39.3 11/30/2020   MCV 99.7 11/30/2020   PLT 187 11/30/2020       Component Value Date/Time   NA 139 12/01/2020 0309   K 3.9 12/01/2020 0309   CL 100 12/01/2020 0309   CO2 23 12/01/2020 0309   GLUCOSE 114 (H) 12/01/2020 0309   BUN 25 (H) 12/01/2020 0309   CREATININE 0.88 12/01/2020 0309   CALCIUM 9.7 12/01/2020 0309   GFRNONAA >60 12/01/2020 0309    Lab Results  Component Value Date   INR 1.0 11/29/2020   INR 1.0 06/18/2020    Assessment/Plan: Right hip acetabular fracture. Discussed with Dr Doreatha Martin who agreed that this is best managed conservatively, no surgery.  Continue PT/OT discharge planning - agree with SNF for now. NWB on the Right LE. Follow up in two weeks with Dr Veverly Fells Call [phone]  for appt  Doran Heater. Veverly Fells, MD 12/01/2020 12:46 PM

## 2020-12-01 NOTE — TOC CAGE-AID Note (Signed)
Transition of Care [facility]) - CAGE-AID Screening   Patient Details  Name: [place] MRN: [id number] Date of Birth: [birth year]  Transition of Care [facility]) CM/SW Contact:    Bethann Berkshire, [place] Phone Number: 12/01/2020, 12:23 PM   Clinical Narrative:  CSW completed CAGE-AID with pt; score of 1. Pt reports she drinks alcohol daily; two, 6oz glasses. CSW discusses some pt's yes answer to the first question; pt is vague about her thoughts to stop or reduce her drinking. She states "It can't be good for me." When CSW offered further resources and discussion pt stated "I don't want them."  CAGE-AID Screening:    Have You Ever Felt You Ought to Cut Down on Your Drinking or Drug Use?: Yes Have People Annoyed You By Critizing Your Drinking Or Drug Use?: No Have You Felt Bad Or Guilty About Your Drinking Or Drug Use?: No Have You Ever Had a Drink or Used Drugs First Thing In The Morning to Steady Your Nerves or to Get Rid of a Hangover?: No CAGE-AID Score: 1  Substance Abuse Education Offered: Yes (Pt refused)

## 2020-12-01 NOTE — Progress Notes (Signed)
   Subjective:   [place] Noble is resting in bed comfortably, brushing her teeth. Patient states she is feeling better. Continues to have some right lower leg pain with movement. Pain well controlled with current pain regimen. Reminded patient that the medication is as needed and will need to be requested. Not eating well or drinking ensures, does not care for them. Discussed importance of adequate diet and nutrition to promote healing.   Patient worked with physical therapy yesterday and pivoted to bedside recliner. Working on moving from bed to chair. Patient feels as though she has the strength to push a wheelchair. Discussed starting blood thinning medication and that she will need to inform staff if she notices any bloody stools.   Uncertain of last BM.  Objective:  Vital signs in last 24 hours: Vitals:   11/30/20 1932 12/01/20 0520 12/01/20 0749 12/01/20 0751  BP:  (!) 150/67 135/67   Pulse:  74 73   Resp:  14 16   Temp:  97.6 F (36.4 C) 97.6 F (36.4 C)   TempSrc:  Oral Oral   SpO2: 97% 97% 97% 97%  Weight:      Height:       Physical exam: General: Frail-appearing elderly female, lying in bed, NAD. Pulm: Normal work of breathing, no respiratory distress noted. MSK: Pain with movement, otherwise unchanged from yesterday.  No peripheral edema noted. Neuro: AAOx3, no focal deficits noted. Skin: Warm and dry.  Assessment/Plan:  Principal Problem:   Right acetabular fracture (HCC) Active Problems:   Essential hypertension   Falls   Mild cognitive impairment  Right nondisplaced anterior column acetabular fracture Physical Deconditioning Was able to work with physical therapy to transfer out of bed to chair but unable to stand up while remaining nonweightbearing on right lower extremity.  Patient is agreeable to going to subacute rehabilitation at discharge for strength training and transfer training.  Continue working with PT while awaiting SNF placement, but otherwise  patient is medically stable for discharge.  Pain has been well controlled with Dilaudid alone thus far. -Nonweightbearing on right leg/hip for the following 6 to 8 weeks -Pain management with p.o. Dilaudid 1 mg every 4 hours as needed and IV fentanyl 12.5 mcg every 4 hours as needed for breakthrough -Added Xarelto for DVT prophylaxis, will need to continue this at discharge -PT/OT recommending SNF  Hypertension Blood pressure well controlled on home Norvasc. 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  States that she began having symptoms of memory loss after her stroke. -On Plavix 75 mg daily and rosuvastatin 10 mg daily  Malnutrition Registered dietitian is following, appreciate assistance.  -Nutrition per RD -Folic acid, thiamine, multivitamins  Prior to Admission Living Arrangement: Home Anticipated Discharge Location: SNF Barriers to Discharge: pending SNF placement Dispo: Anticipated discharge in approximately 1-2 day(s).   Virl Axe, MD 12/01/2020, 8:50 AM Pager: [phone] After 5pm on weekdays and 1pm on weekends: On Call pager [phone]

## 2020-12-01 NOTE — Discharge Instructions (Signed)
Maintain strict NWB on the right LE at all times. Do exercises to promote circulation and prevent blood clots as instructed throughout the day.

## 2020-12-01 NOTE — Plan of Care (Signed)
  Problem: Education: Goal: Knowledge of General Education information will improve Description: Including pain rating scale, medication(s)/side effects and non-pharmacologic comfort measures Outcome: Progressing   Problem: Health Behavior/Discharge Planning: Goal: Ability to manage health-related needs will improve Outcome: Progressing   Problem: Clinical Measurements: Goal: Will remain free from infection Outcome: Progressing   

## 2020-12-02 LAB — SARS CORONAVIRUS 2 (TAT 6-24 HRS): SARS Coronavirus 2: NEGATIVE

## 2020-12-02 NOTE — TOC Progression Note (Signed)
Transition of Care [facility]) - Progression Note    Patient Details  Name: [place] MRN: [id number] Date of Birth: June 24, 1945  Transition of Care [facility]) CM/SW Contact  Milinda Antis, LCSWA Phone Number: 12/02/2020, 1:03 PM  Clinical Narrative:     CSW presented bed offers for SNF on this day.  The patient [facility].  CSW spoke with Nicki with admissions at [facility] and the\ facility will be able to accept the patient tomorrow.    MD notified.  Pending: COVID test  Expected Discharge Plan: [place] Barriers to Discharge: Continued Medical Work up  Expected Discharge Plan and Services Expected Discharge Plan: [place] arrangements for the past 2 months: Single Family Home                                       Social Determinants of Health (SDOH) Interventions    Readmission Risk Interventions No flowsheet data found.

## 2020-12-02 NOTE — Discharge Summary (Signed)
Name: Crystal Noble MRN: [id number] DOB: [birth year] 75 y.o. PCP: Fanny Bien, MD  Date of Admission: 11/29/2020 12:00 PM Date of Discharge: 12/03/2020 Attending Physician: [name], *  Discharge Diagnosis: 1.  Right nondisplaced anterior column acetabular fracture 2.  Physical deconditioning, malnutrition 3.  Hypertension  Discharge Medications: Allergies as of 12/03/2020       Reactions   Other Other (See Comments)   "Exacerbates my collagenous colitis"   Penicillin G Rash        Medication List     STOP taking these medications    aspirin EC 81 MG tablet   valsartan-hydrochlorothiazide 320-25 MG tablet Commonly known as: DIOVAN-HCT       TAKE these medications    amLODipine 10 MG tablet Commonly known as: NORVASC Take 1 tablet (10 mg total) by mouth daily.   buPROPion 150 MG 24 hr tablet Commonly known as: WELLBUTRIN XL Take 1 tablet by mouth every morning.   clopidogrel 75 MG tablet Commonly known as: PLAVIX Take 1 tablet (75 mg total) by mouth daily.   escitalopram 20 MG tablet Commonly known as: LEXAPRO Take 20 mg by mouth daily.   fluticasone-salmeterol 230-21 MCG/ACT inhaler Commonly known as: ADVAIR HFA Inhale 2 puffs into the lungs 2 (two) times daily.   HYDROcodone-acetaminophen 5-325 MG tablet Commonly known as: Norco Take 1 tablet by mouth every 6 (six) hours as needed for up to 5 days for moderate pain.   montelukast 10 MG tablet Commonly known as: SINGULAIR Take 10 mg by mouth at bedtime.   multivitamin with minerals Tabs tablet Take 1 tablet by mouth daily.   rivaroxaban 10 MG Tabs tablet Commonly known as: XARELTO Take 1 tablet (10 mg total) by mouth daily. Start taking on: December 04, 2020   rosuvastatin 10 MG tablet Commonly known as: CRESTOR Take 10 mg by mouth at bedtime.       ASK your doctor about these medications    ARIPiprazole 5 MG tablet Commonly known as: ABILIFY Take 1 tablet (5  mg total) by mouth daily.   feeding supplement Liqd Take 237 mLs by mouth 3 (three) times daily between meals.   folic acid 1 MG tablet Commonly known as: FOLVITE Take 1 tablet (1 mg total) by mouth daily.   senna-docusate 8.6-50 MG tablet Commonly known as: Senokot-S Take 1 tablet by mouth at bedtime as needed for mild constipation.   thiamine 100 MG tablet Take 1 tablet (100 mg total) by mouth daily.        Disposition and follow-up:   Crystal Noble was discharged from [facility] in Stable condition.  At the hospital follow up visit please address:  1.  Right nondisplaced anterior column acetabular fracture.  Orthopedics recommendations included remaining nonweightbearing on right leg/hip for the next 6 to 8 weeks to allow for healing. Due to high DVT risk, patient started on Xarelto which will need to be continued for the next 6 to 8 weeks until she is more mobile.  We will also continue patient's Plavix for secondary stroke prevention.  Will discharge with 5-day course of norco q6h prn for acute pain control. Patient will be going to subacute rehabilitation at discharge for strength and transfer training.  Follow-up with orthopedics, Dr. Veverly Fells, scheduled in 2 weeks (patient will need to call to make appointment).  2.  Physical deconditioning, malnutrition.  Going to subacute rehabilitation at discharge for strength and transfer training.  Will need extensive  work with physical therapy to regain strength.  Would benefit from high-protein diet per registered dietitian (estimated nutritional needs include kcal 1450-1650, protein 70 to 85 g, and fluid greater than 1.4 L).  3.  Hypertension.  Resume home Norvasc 10 mg daily at discharge.  Will discontinue valsartan-HCTZ at this time until follow-up with PCP as this may have contributed to possible lightheadedness around time of fall. F/u with PCP in 2-3 weeks.  2.  Labs / imaging needed at time of follow-up:  CBC, BMP  3.  Pending labs/ test needing follow-up: NONE  Follow-up Appointments:  Contact information for follow-up providers     Netta Cedars, MD Follow up in 2 week(s).   Specialty: Orthopedic Surgery Why: call [phone] for appt Contact information: [address] [place] [address] [phone]         Fanny Bien, MD. Schedule an appointment as soon as possible for a visit in 1 month(s).   Specialty: Family Medicine Contact information: [address] [place] [address] [phone]              Contact information for after-discharge care     Destination     [facility] Preferred SNF .   Service: Skilled Nursing Contact information: [address] [place] [facility] Course by problem list: 1.  Right nondisplaced anterior column acetabular fracture.  Patient presents via EMS for a right acetabular fracture after fall.  She has a difficult time remembering the events leading up to her fall due to mild cognitive impairment secondary to history of stroke, but does report feeling lightheaded.  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  Right hip MRI showed acute nondisplaced fracture of the right acetabulum involving the acetabular roof and anterior acetabulum and a small right hip joint effusion.  Orthopedics consulted, recommended nonoperative management.  Orthopedics recommendations included remaining nonweightbearing on right leg/hip for the next 6 to 8 weeks to allow for healing.  Pain has been well controlled with PO Dilaudid 1 mg q6h prn.  Due to high DVT risk, patient started on Xarelto which will need to be continued for the next 6 to 8 weeks until she is  more mobile.  We will also continue patient's Plavix for secondary stroke prevention.  Patient will be going to subacute rehabilitation at discharge for strength and transfer training.  Will discharge with 5-day course of norco q6h prn for acute pain control. Follow-up with orthopedics, Dr. Veverly Fells, scheduled in 2 weeks (patient will need to call to make appointment).  2.  Physical deconditioning, malnutrition. Going to subacute rehabilitation at discharge for strength and transfer training.  Will need extensive work with physical therapy to regain strength.  Would benefit from high-protein diet per registered dietitian (estimated nutritional needs include kcal 1450-1650, protein 70 to 85 g, and fluid greater than 1.4 L).  3.  Hypertension.  Resume home Norvasc 10 mg daily at discharge. Blood pressures stable during admission.  Will discontinue valsartan-HCTZ at discharge until follow-up with PCP as this may have contributed to possible lightheadedness around time of fall. F/u with PCP in 2-3 weeks.   Discharge Subjective: Crystal Noble is  resting in bed comfortably today. Discussed she will be discharged today to [facility]. She will have follow up with the orthopedic surgeon in 2 weeks. Discussed continuation of her blood thinner.   Discharge Exam:   BP (!) 151/71   Pulse 81   Temp 98.5 F (36.9 C) (Oral)   Resp 17   Ht 5' (1.524 m)   Wt 35.5 kg   SpO2 95%   BMI 15.28 kg/m  Discharge exam:  General: elderly female, lying in bed, NAD. CV: normal rate and regular rhythm, no m/r/g. Pulm: clear to auscultation bilaterally, no adventitious sounds noted. Skin: warm and dry. Neuro: AAOx3, no focal deficits. Psych: calm and cooperative.  Pertinent Labs, Studies, and Procedures:  CMP Latest Ref Rng & Units 12/01/2020 11/30/2020 11/29/2020  Glucose 70 - 99 mg/dL 114(H) 82 87  BUN 8 - 23 mg/dL 25(H) 21 22  Creatinine 0.44 - 1.00 mg/dL 0.88 1.08(H) 1.06(H)  Sodium 135 - 145 mmol/L 139 136 137   Potassium 3.5 - 5.1 mmol/L 3.9 3.9 4.3  Chloride 98 - 111 mmol/L 100 100 101  CO2 22 - 32 mmol/L 23 22 19(L)  Calcium 8.9 - 10.3 mg/dL 9.7 9.6 9.8  Total Protein 6.5 - 8.1 g/dL - 6.7 7.4  Total Bilirubin 0.3 - 1.2 mg/dL - 2.0(H) 2.3(H)  Alkaline Phos 38 - 126 U/L - 60 62  AST 15 - 41 U/L - 25 30  ALT 0 - 44 U/L - 20 24   CBC Latest Ref Rng & Units 11/30/2020 11/29/2020 06/29/2020  WBC 4.0 - 10.5 K/uL 6.5 8.7 5.7  Hemoglobin 12.0 - 15.0 g/dL 13.3 13.8 11.1(L)  Hematocrit 36.0 - 46.0 % 39.3 41.9 32.4(L)  Platelets 150 - 400 K/uL 187 200 214   Lab Results  Component Value Date   ETH <10 11/29/2020   Lab Results  Component Value Date   PHOS 3.1 12/01/2020   PHOS 3.5 11/30/2020   MG 2.1 12/01/2020   MG 1.7 11/30/2020   Lab Results  Component Value Date   LABPROT 12.9 11/29/2020   INR 1.0 11/29/2020     Discharge Instructions: Discharge Instructions     Call MD for:  difficulty breathing, headache or visual disturbances   Complete by: As directed    Call MD for:  persistant dizziness or light-headedness   Complete by: As directed    Call MD for:  persistant nausea and vomiting   Complete by: As directed    Call MD for:  severe uncontrolled pain   Complete by: As directed    Call MD for:  temperature >100.4   Complete by: As directed    Diet - low sodium heart healthy   Complete by: As directed    Discharge instructions   Complete by: As directed    Crystal Noble, you came in with a fracture of the socket in your right hip after a fall. Orthopedics recommended allowing to heal on its own.  Please take note of the following: 1. Remain nonweightbearing in your right leg for the next 6-8 weeks. 2. You will be going to a nursing facility today for physical rehabilitation. 3. I have prescribed a pain medication to help control your pain. 4. I have prescribed a blood thinner to help prevent a blood clot from forming. Use this daily for the next 6-8 weeks. 5. Make an  appointment with Dr. Veverly Fells, Orthopedics, for a visit in 2 weeks (call [phone] to schedule appt). 6. Make an appointment  with your primary care doctor for a visit in 2-3 weeks. We are stopping one of your blood pressure medications and your primary care doctor can decide if this needs to be restarted or not.   Increase activity slowly   Complete by: As directed        Signed: Virl Axe, MD 12/03/2020, 10:34 AM   Pager: [phone]

## 2020-12-02 NOTE — Progress Notes (Signed)
   Subjective:   Crystal Noble is doing well today.  States she has not been able to pick a SNF yet.  Was able to eat some food.  Objective:  Vital signs in last 24 hours: Vitals:   12/01/20 0751 12/01/20 1445 12/01/20 2110 12/01/20 2200  BP:  122/62  139/67  Pulse:  78  77  Resp:  16  18  Temp:  98.3 F (36.8 C)  98.2 F (36.8 C)  TempSrc:  Oral  Oral  SpO2: 97% 95% 93% 95%  Weight:      Height:       Physical exam: General: Frail-appearing elderly female, lying in bed, NAD. Pulm: Normal work of breathing, no respiratory distress noted. MSK: Unable to passively move right lower extremity without pain.  No peripheral edema noted.  No calf tenderness. Neuro: AAOx3, no focal deficits noted. Skin: Warm and dry.   Assessment/Plan:  Principal Problem:   Right acetabular fracture (HCC) Active Problems:   Essential hypertension   Falls   Mild cognitive impairment   Protein-calorie malnutrition, severe  Right nondisplaced anterior column acetabular fracture Physical Deconditioning Patient is now medically stable for discharge, pending SNF placement.  She will need to continue to remain nonweightbearing on right lower extremity for the next 6 to 8 weeks.  Continue working with PT while awaiting SNF placement.  For pain continues to be well controlled with Dilaudid alone.  Fentanyl has been discontinued.  Continue Xarelto for DVT prophylaxis for the next 6 to 8 weeks until she becomes more mobile.  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   Virl Axe, MD 12/02/2020,  6:24 AM Pager: [phone] After 5pm on weekdays and 1pm on weekends: On Call pager [phone]

## 2020-12-02 NOTE — Care Management Important Message (Signed)
Important Message  Patient Details  Name: Crystal Noble MRN: [id number] Date of Birth: 08/10/1945   Medicare Important Message Given:  Yes     Orbie Pyo 12/02/2020, 3:54 PM

## 2020-12-02 NOTE — NC FL2 (Signed)
[place] LEVEL OF CARE SCREENING TOOL     IDENTIFICATION  Patient Name: Crystal Noble Birthdate: 04/23/1946 Sex: female Admission Date (Current Location): 11/29/2020  [name] and [place] Number:  [name]:  [facility] [place] [address], [place], [place] [address]      Provider Number: [id number]  Attending Physician Name and Address:  [name], *  Relative Name and Phone Number:       Current Level of Care: Hospital Recommended Level of Care: [name] Prior Approval Number:    Date Approved/Denied:   PASRR Number: [id number]  Discharge Plan: SNF    Current Diagnoses: Patient Active Problem List   Diagnosis Date Noted   Protein-calorie malnutrition, severe 12/01/2020   Mild cognitive impairment 11/30/2020   Right acetabular fracture ([place]) 11/29/2020   Hypokalemia 06/26/2020   Hypomagnesemia 06/26/2020   Alcohol abuse 06/26/2020   Anxiety disorder 06/26/2020   COPD (chronic obstructive pulmonary disease) ([place]) 06/26/2020   Falls 06/26/2020   Multiple falls    AKI (acute kidney injury) ([place]) 06/25/2020   PAD (peripheral artery disease) ([place]) 08/14/2019   Mixed hyperlipidemia 08/14/2019   Exertional dyspnea 05/13/2019   Coronary artery disease involving native coronary artery of native heart without angina pectoris 03/27/2019   Aortic atherosclerosis ([place]) 03/27/2019   Tobacco abuse 03/27/2019   Essential hypertension 03/27/2019   Asymptomatic carotid artery stenosis without infarction 03/27/2014    Orientation RESPIRATION BLADDER Height & Weight     Self, Time, Situation, Place  Normal Continent Weight: 78 lb 4.2 oz (35.5 kg) Height:  5' (152.4 cm)  BEHAVIORAL SYMPTOMS/MOOD NEUROLOGICAL BOWEL NUTRITION STATUS      Continent Diet (refer to d/c summary)  AMBULATORY STATUS COMMUNICATION OF NEEDS Skin   Extensive Assist Verbally Surgical wounds                        Personal Care Assistance Level of Assistance  Bathing, Feeding, Dressing Bathing Assistance: Maximum assistance Feeding assistance: Independent Dressing Assistance: Maximum assistance     Functional Limitations Info  Sight, Hearing, Speech Sight Info: Adequate Hearing Info: Adequate Speech Info: Adequate    SPECIAL CARE FACTORS FREQUENCY  PT (By licensed PT), OT (By licensed OT)     PT Frequency: 5x/week, evaluate and treat OT Frequency: 5x/week, evaluate and treat            Contractures Contractures Info: Not present    Additional Factors Info  Code Status Code Status Info: Full Code             Current Medications (12/02/2020):  This is the current hospital active medication list Current Facility-Administered Medications  Medication Dose Route Frequency Provider Last Rate Last Admin   acetaminophen (TYLENOL) tablet 650 mg  650 mg Oral Q6H PRN Jose Persia, MD       albuterol (VENTOLIN HFA) 108 (90 Base) MCG/ACT inhaler 1 puff  1 puff Inhalation Q4H PRN [name], MD       amLODipine (NORVASC) tablet 10 mg  10 mg Oral Daily Marianna Payment, MD   10 mg at 12/02/20 0800   ARIPiprazole (ABILIFY) tablet 5 mg  5 mg Oral Daily Marianna Payment, MD   5 mg at 12/02/20 0758   clopidogrel (PLAVIX) tablet 75 mg  75 mg Oral Daily Marianna Payment, MD   75 mg at 12/02/20 0759   escitalopram (LEXAPRO) tablet 20 mg  20 mg  Oral Daily Marianna Payment, MD   20 mg at 12/02/20 0758   feeding supplement (ENSURE ENLIVE / ENSURE PLUS) liquid 237 mL  237 mL Oral TID BM [name], MD       folic acid (FOLVITE) tablet 1 mg  1 mg Oral Daily Marianna Payment, MD   1 mg at 12/02/20 0759   HYDROmorphone (DILAUDID) tablet 1 mg  1 mg Oral Q4H PRN Jose Persia, MD   1 mg at 12/02/20 0758   mometasone-formoterol (DULERA) 200-5 MCG/ACT inhaler 2 puff  2 puff Inhalation BID Marianna Payment, MD   2 puff at 12/02/20 0801   montelukast (SINGULAIR) tablet 10 mg  10 mg Oral Reino Kent, MD   10 mg at 12/01/20 2238   multivitamin with minerals tablet 1 tablet  1 tablet Oral Daily Marianna Payment, MD   1 tablet at 12/02/20 0800   nicotine (NICODERM CQ - dosed in mg/24 hours) patch 14 mg  14 mg Transdermal Daily Marianna Payment, MD   14 mg at 12/02/20 0801   ondansetron (ZOFRAN) tablet 4 mg  4 mg Oral Q6H PRN Marianna Payment, MD       Or   ondansetron [facility]) injection 4 mg  4 mg Intravenous Q6H PRN Marianna Payment, MD       polyethylene glycol (MIRALAX / GLYCOLAX) packet 17 g  17 g Oral Daily Marianna Payment, MD   17 g at 12/02/20 0757   rivaroxaban (XARELTO) tablet 10 mg  10 mg Oral Daily Jose Persia, MD   10 mg at 12/02/20 0800   rosuvastatin (CRESTOR) tablet 10 mg  10 mg Oral Reino Kent, MD   10 mg at 12/01/20 2238   sodium chloride flush (NS) 0.9 % injection 3 mL  3 mL Intravenous Q12H Marianna Payment, MD   3 mL at 12/02/20 7106   thiamine tablet 100 mg  100 mg Oral Daily Marianna Payment, MD   100 mg at 12/02/20 2694     Discharge Medications: Please see discharge summary for a list of discharge medications.  Relevant Imaging Results:  Relevant Lab Results:   Additional Information    Crystal Noble Crystal Noble, LCSWA

## 2020-12-02 NOTE — Progress Notes (Signed)
Physical Therapy Treatment Patient Details Name: Crystal Noble MRN: [id number] DOB: 1946-03-21 Today's Date: 12/02/2020    History of Present Illness 75 y.o. female admitted on 11/29/20 for fall with stable acetabluar fx.  Non-operative management and NWB for 6-8 weeks (right).  Pt with significant PMH of carotid stenosis, colitis, HTN, and stroke.    PT Comments    Pt received in supine, agreeable to therapy session and with good participation and tolerance for transfer training and supine/seated exercises for strengthening. Pt needed verbal and tactile cues for NWB precautions on R leg (unable to recall when staff entered room) and manual assist to maintain RLE NWB with transfers this date. Pt limited by fatigue and unable to attempt pivot transfer to chair or pre-gait tasks, but able to stand ~2 mins for standing BP assessment and modA for all functional mobility tasks today. VSS on RA, pt reports subjective dizziness while seated but BP stable. Pt continues to benefit from PT services to progress toward functional mobility goals.    Follow Up Recommendations  SNF;Supervision for mobility/OOB     Equipment Recommendations  Rolling walker with 5" wheels;Wheelchair (measurements PT);Wheelchair cushion (measurements PT);3in1 (PT) (junior size RW (pt is 28ft 0"))    Recommendations for Other Services       Precautions / Restrictions Precautions Precautions: Fall Restrictions Weight Bearing Restrictions: Yes RLE Weight Bearing: Non weight bearing Other Position/Activity Restrictions: pt needs to be reminded    Mobility  Bed Mobility Overal bed mobility: Needs Assistance Bed Mobility: Supine to Sit;Sit to Sidelying;Rolling Rolling: Mod assist   Supine to sit: Mod assist;HOB elevated   Sit to sidelying: Mod assist General bed mobility comments: Mod A to manage BLE back into bed; pt with decreased initiation this date and needs max cues for technique/safety to get to EOB. vc for  NWB on RLE, pt reports she did not recall from previous session.    Transfers Overall transfer level: Needs assistance Equipment used: Rolling walker (2 wheeled) Transfers: Sit to/from Stand Sit to Stand: Mod assist         General transfer comment: mod assist for STS to RW, pt needs manual assist to keep RLE elevated in front of her/prevent NWB during standing BP assessment at RW. Pt c/o fatigue after standing and seated exercises so deferred stand pivot to chair today. maxA for seated lateral scooting toward HOB due to fatigue.  Ambulation/Gait                 Stairs             Wheelchair Mobility    Modified Rankin (Stroke Patients Only)       Balance Overall balance assessment: Needs assistance Sitting-balance support: Feet supported;Bilateral upper extremity supported Sitting balance-Leahy Scale: Fair     Standing balance support: Bilateral upper extremity supported Standing balance-Leahy Scale: Poor Standing balance comment: needs support from RW and therapist, L/posterior lean and difficulty keeping upright posture >2 minutes. Able to stand ~2 mins during standing BP assessment.                            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 and moved back to [place] ~10 years ago- pt continues to deny awareness of how she fell; very flat and seems a bit depressed this date regarding situation, RN notified in case she wants to talk with chaplain      Exercises General  Exercises - Lower Extremity Ankle Circles/Pumps: AROM;Both;20 reps (encouraged hourly for antiembolic purposes) Quad Sets: AROM;Right;5 reps;Supine Long Arc Quad: AROM;Both;10 reps;Seated Heel Slides: AROM;Right;10 reps;Supine Hip ABduction/ADduction: AROM;Right;10 reps;Supine Straight Leg Raises: AAROM;Right;10 reps;Supine    General Comments General comments (skin integrity, edema, etc.): BP 135/65 (86) seated EOB, dizzy. BP 122/73 (88) standing at RW, pt denies more dizziness just tired. HR 80 bpm resting and up to 107 bpm with exertion. SpO2 95% on RA      Pertinent Vitals/Pain Pain Assessment: Faces Faces Pain Scale: Hurts little more Pain Location: R hip / leg Pain Descriptors / Indicators: Discomfort;Grimacing Pain Intervention(s): Limited activity within patient's tolerance;Monitored during session;Premedicated before session;Repositioned (pt defers ice pack)    Home Living                      Prior Function            PT Goals (current goals can now be found in the care plan section) Acute Rehab PT Goals Patient Stated Goal: none stated, but agreeable to SNF rehab PT Goal Formulation: With patient Time For Goal Achievement: 12/14/20 Progress towards PT goals: Progressing toward goals (pain/fatigue limiting)    Frequency    Min 3X/week      PT Plan Current plan remains appropriate    Co-evaluation              AM-PAC PT "6 Clicks" Mobility   Outcome Measure  Help needed turning from your back to your side while in a flat bed without using bedrails?: A Lot Help needed moving from lying on your back to sitting on the side of a flat bed without using bedrails?: A Lot Help needed moving to and from a bed to a chair (including a wheelchair)?: A Lot Help needed standing up from a chair using your arms (e.g., wheelchair or bedside chair)?: A Lot Help needed to walk in hospital room?: Total Help needed climbing 3-5 steps with a railing? : Total 6  Click Score: 10    End of Session Equipment Utilized During Treatment: Gait belt Activity Tolerance: Patient limited by fatigue;Patient limited by pain Patient left: in bed;with call bell/phone within reach;with bed alarm set;Other (comment);with SCD's reapplied (bed in chair position, pt set up to call for dinner) Nurse Communication: Mobility status;Weight bearing status;Other (comment) (pt depressed affect, may want to consider speaking with chaplain) PT Visit Diagnosis: Muscle weakness (generalized) (M62.81);Difficulty in walking, not elsewhere classified (R26.2);Pain Pain - Right/Left: Right Pain - part of body: Hip     Time: 7782-4235 PT Time Calculation (min) (ACUTE ONLY): 30 min  Charges:  $Therapeutic Exercise: 8-22 mins $Therapeutic Activity: 8-22 mins                     Danyeal Akens P., PTA Acute Rehabilitation Services Pager: [phone] Office: [place] 12/02/2020, 3:23 PM

## 2020-12-03 DIAGNOSIS — I739 Peripheral vascular disease, unspecified: Secondary | ICD-10-CM | POA: Diagnosis not present

## 2020-12-03 DIAGNOSIS — I6529 Occlusion and stenosis of unspecified carotid artery: Secondary | ICD-10-CM | POA: Diagnosis not present

## 2020-12-03 DIAGNOSIS — I69828 Other speech and language deficits following other cerebrovascular disease: Secondary | ICD-10-CM | POA: Diagnosis not present

## 2020-12-03 DIAGNOSIS — I700 Atherosclerosis of aorta: Secondary | ICD-10-CM | POA: Diagnosis not present

## 2020-12-03 DIAGNOSIS — E782 Mixed hyperlipidemia: Secondary | ICD-10-CM | POA: Diagnosis not present

## 2020-12-03 DIAGNOSIS — I69398 Other sequelae of cerebral infarction: Secondary | ICD-10-CM | POA: Diagnosis not present

## 2020-12-03 DIAGNOSIS — S32434D Nondisplaced fracture of anterior column [iliopubic] of right acetabulum, subsequent encounter for fracture with routine healing: Secondary | ICD-10-CM | POA: Diagnosis not present

## 2020-12-03 DIAGNOSIS — R64 Cachexia: Secondary | ICD-10-CM | POA: Diagnosis not present

## 2020-12-03 DIAGNOSIS — M6281 Muscle weakness (generalized): Secondary | ICD-10-CM | POA: Diagnosis not present

## 2020-12-03 DIAGNOSIS — R262 Difficulty in walking, not elsewhere classified: Secondary | ICD-10-CM | POA: Diagnosis not present

## 2020-12-03 DIAGNOSIS — R2681 Unsteadiness on feet: Secondary | ICD-10-CM | POA: Diagnosis not present

## 2020-12-03 DIAGNOSIS — K760 Fatty (change of) liver, not elsewhere classified: Secondary | ICD-10-CM | POA: Diagnosis not present

## 2020-12-03 DIAGNOSIS — S32401A Unspecified fracture of right acetabulum, initial encounter for closed fracture: Secondary | ICD-10-CM | POA: Diagnosis not present

## 2020-12-03 DIAGNOSIS — I2510 Atherosclerotic heart disease of native coronary artery without angina pectoris: Secondary | ICD-10-CM | POA: Diagnosis not present

## 2020-12-03 DIAGNOSIS — R102 Pelvic and perineal pain: Secondary | ICD-10-CM | POA: Diagnosis not present

## 2020-12-03 DIAGNOSIS — R41841 Cognitive communication deficit: Secondary | ICD-10-CM | POA: Diagnosis not present

## 2020-12-03 DIAGNOSIS — E43 Unspecified severe protein-calorie malnutrition: Secondary | ICD-10-CM | POA: Diagnosis not present

## 2020-12-03 DIAGNOSIS — Z7401 Bed confinement status: Secondary | ICD-10-CM | POA: Diagnosis not present

## 2020-12-03 DIAGNOSIS — I10 Essential (primary) hypertension: Secondary | ICD-10-CM | POA: Diagnosis not present

## 2020-12-03 DIAGNOSIS — J449 Chronic obstructive pulmonary disease, unspecified: Secondary | ICD-10-CM | POA: Diagnosis not present

## 2020-12-03 DIAGNOSIS — R296 Repeated falls: Secondary | ICD-10-CM | POA: Diagnosis not present

## 2020-12-03 DIAGNOSIS — Z20822 Contact with and (suspected) exposure to covid-19: Secondary | ICD-10-CM | POA: Diagnosis not present

## 2020-12-03 DIAGNOSIS — M25551 Pain in right hip: Secondary | ICD-10-CM | POA: Diagnosis not present

## 2020-12-03 DIAGNOSIS — Z681 Body mass index (BMI) 19 or less, adult: Secondary | ICD-10-CM | POA: Diagnosis not present

## 2020-12-03 DIAGNOSIS — I959 Hypotension, unspecified: Secondary | ICD-10-CM | POA: Diagnosis not present

## 2020-12-03 DIAGNOSIS — S32434A Nondisplaced fracture of anterior column [iliopubic] of right acetabulum, initial encounter for closed fracture: Secondary | ICD-10-CM | POA: Diagnosis not present

## 2020-12-03 DIAGNOSIS — R531 Weakness: Secondary | ICD-10-CM | POA: Diagnosis not present

## 2020-12-03 DIAGNOSIS — K529 Noninfective gastroenteritis and colitis, unspecified: Secondary | ICD-10-CM | POA: Diagnosis not present

## 2020-12-03 DIAGNOSIS — Z9181 History of falling: Secondary | ICD-10-CM | POA: Diagnosis not present

## 2020-12-03 MED ORDER — RIVAROXABAN 10 MG PO TABS: 10.0000 mg | ORAL_TABLET | Freq: Every day | ORAL | 0 refills | Status: DC

## 2020-12-03 MED ORDER — HYDROCODONE-ACETAMINOPHEN 5-325 MG PO TABS: 1.0000 | ORAL_TABLET | Freq: Four times a day (QID) | ORAL | 0 refills | Status: AC | PRN

## 2020-12-03 MED ORDER — HYDROCODONE-ACETAMINOPHEN 5-325 MG PO TABS: 1.0000 | ORAL_TABLET | Freq: Four times a day (QID) | ORAL | 0 refills | Status: DC | PRN

## 2020-12-03 NOTE — Plan of Care (Addendum)
Report called and given to Olu RN @1130 .   Tele called @1500 , pt has wide QRS, ST elevation >2, bundle branch block .16, obtaining EKG now and full set VS, no complaints of chest pain. VS stable. notified Dr. Coy Saunas   Problem: Education: Goal: Knowledge of General Education information will improve Description: Including pain rating scale, medication(s)/side effects and non-pharmacologic comfort measures Outcome: Progressing   Problem: Activity: Goal: Risk for activity intolerance will decrease Outcome: Progressing   Problem: Pain Managment: Goal: General experience of comfort will improve Outcome: Progressing   Problem: Safety: Goal: Ability to remain free from injury will improve Outcome: Progressing   Problem: Skin Integrity: Goal: Risk for impaired skin integrity will decrease Outcome: Progressing

## 2020-12-03 NOTE — Plan of Care (Signed)
  Problem: Health Behavior/Discharge Planning: Goal: Ability to manage health-related needs will improve Outcome: Progressing   Problem: Clinical Measurements: Goal: Ability to maintain clinical measurements within normal limits will improve Outcome: Progressing Goal: Will remain free from infection Outcome: Progressing Goal: Diagnostic test results will improve Outcome: Progressing   

## 2020-12-03 NOTE — Plan of Care (Signed)
Problem: Education: Goal: Knowledge of General Education information will improve Description: Including pain rating scale, medication(s)/side effects and non-pharmacologic comfort measures 12/03/2020 1613 by Trixie Deis, RN Outcome: Adequate for Discharge 12/03/2020 1124 by Trixie Deis, RN Outcome: Progressing   Problem: Activity: Goal: Risk for activity intolerance will decrease 12/03/2020 1613 by Trixie Deis, RN Outcome: Adequate for Discharge 12/03/2020 1124 by Trixie Deis, RN Outcome: Progressing   Problem: Pain Managment: Goal: General experience of comfort will improve 12/03/2020 1613 by Trixie Deis, RN Outcome: Adequate for Discharge 12/03/2020 1124 by Trixie Deis, RN Outcome: Progressing   Problem: Safety: Goal: Ability to remain free from injury will improve 12/03/2020 1613 by Trixie Deis, RN Outcome: Adequate for Discharge 12/03/2020 1124 by Trixie Deis, RN Outcome: Progressing   Problem: Skin Integrity: Goal: Risk for impaired skin integrity will decrease 12/03/2020 1613 by Trixie Deis, RN Outcome: Adequate for Discharge 12/03/2020 1124 by Trixie Deis, RN Outcome: Progressing

## 2020-12-03 NOTE — TOC Transition Note (Signed)
Transition of Care [facility]) - CM/SW Discharge Note   Patient Details  Name: Crystal Noble MRN: [id number] Date of Birth: 1946-02-27  Transition of Care [facility]) CM/SW Contact:  Milinda Antis, [place] Phone Number: 12/03/2020, 11:28 AM   Clinical Narrative:    Patient will DC to: [facility] Anticipated DC date: 12/03/2020 Transport by: Corey Harold   Per MD patient ready for DC to SNF. RN to call report prior to discharge [phone]. RN, patient, patient's family, and facility notified of DC. Discharge Summary and FL2 sent to facility. DC packet on chart. Ambulance transport will be requested for patient when RN is ready.   CSW will sign off for now as social work intervention is no longer needed. Please consult [place] again if new needs arise.     Final next level of care: Skilled Nursing Facility Barriers to Discharge: Barriers Resolved   Patient Goals and CMS Choice   CMS Medicare.gov Compare Post Acute Care list provided to:: Patient    Discharge Placement PASRR number recieved:  ([id number])            Patient chooses bed at: [facility] Patient to be transferred to facility by: [place] Name of family member notified: Patient alert Patient and family notified of of transfer: 12/03/20  Discharge Plan and Services                                     Social Determinants of Health (SDOH) Interventions     Readmission Risk Interventions No flowsheet data found.

## 2020-12-03 NOTE — Consult Note (Signed)
   [facility] Inpatient Consult   12/03/2020  Crystal Noble 1945-09-16 [id number]  [place] [facility] [ACO] Patient: Medicare CMS DCE  Patient will [place] [facility] RN with patient with traditional Medicare for any known or needs for transitional care needs for returning to post facility care or complex disease management.  For questions or referrals, please contact:   Natividad Brood, RN BSN [facility] Liaison  [phone] business mobile phone Toll free office [phone]  Fax number: [phone] [email or web address]

## 2020-12-03 NOTE — Progress Notes (Signed)
Paged by nursing staff regarding concern for ST elevation on telemetry. Examined and evaluated patient at bedside. Denies any chest pain, pressure, palpitations, dyspnea, sweats, nausea, or vomiting. Reviewed 12-lead ecg at bedside showing left bundle branch block with concave ST elevation on V1-V3. ECG does not meet criteria for MI based on modified [place] Criteria and patient is not complaining of any anginal symptoms. Low suspicion for MI. Okay to discharge.

## 2020-12-04 DIAGNOSIS — J449 Chronic obstructive pulmonary disease, unspecified: Secondary | ICD-10-CM | POA: Diagnosis not present

## 2020-12-04 DIAGNOSIS — S32401A Unspecified fracture of right acetabulum, initial encounter for closed fracture: Secondary | ICD-10-CM | POA: Diagnosis not present

## 2020-12-04 DIAGNOSIS — R262 Difficulty in walking, not elsewhere classified: Secondary | ICD-10-CM | POA: Diagnosis not present

## 2020-12-04 DIAGNOSIS — R296 Repeated falls: Secondary | ICD-10-CM | POA: Diagnosis not present

## 2020-12-10 DIAGNOSIS — S32401A Unspecified fracture of right acetabulum, initial encounter for closed fracture: Secondary | ICD-10-CM | POA: Diagnosis not present

## 2020-12-10 DIAGNOSIS — R296 Repeated falls: Secondary | ICD-10-CM | POA: Diagnosis not present

## 2020-12-10 DIAGNOSIS — R262 Difficulty in walking, not elsewhere classified: Secondary | ICD-10-CM | POA: Diagnosis not present

## 2020-12-10 DIAGNOSIS — E43 Unspecified severe protein-calorie malnutrition: Secondary | ICD-10-CM | POA: Diagnosis not present

## 2020-12-15 DIAGNOSIS — Z20822 Contact with and (suspected) exposure to covid-19: Secondary | ICD-10-CM | POA: Diagnosis not present

## 2020-12-15 DIAGNOSIS — M25551 Pain in right hip: Secondary | ICD-10-CM | POA: Diagnosis not present

## 2020-12-15 DIAGNOSIS — R102 Pelvic and perineal pain: Secondary | ICD-10-CM | POA: Diagnosis not present

## 2020-12-21 ENCOUNTER — Other Ambulatory Visit: Payer: Self-pay | Admitting: *Deleted

## 2020-12-21 NOTE — Patient Outreach (Signed)
Member screened for potential [facility] Care Management needs.  Mrs. Baltz resides in [facility].   Communication sent to [facility] SW to inquire about transition plans.   Will continue to follow while member resides in SNF.   Marthenia Rolling, MSN, RN,BSN [place] Acute Care Coordinator [phone] [facility]) [phone]  (Toll free office)

## 2020-12-24 ENCOUNTER — Other Ambulatory Visit: Payer: Self-pay | Admitting: *Deleted

## 2020-12-24 NOTE — Patient Outreach (Signed)
[facility] Post- Acute Care Coordinator follow up. Member screened for potential [facility] Care Management needs.   Update received from [place] indicating transition plan is to return home. No anticipated transition date at this time.   Will continue to follow for transition plans and potential [facility] Care Management needs.    Marthenia Rolling, MSN, RN,BSN [place] Acute Care Coordinator [phone] [facility]) [phone]  (Toll free office)

## 2021-01-06 DIAGNOSIS — J449 Chronic obstructive pulmonary disease, unspecified: Secondary | ICD-10-CM | POA: Diagnosis not present

## 2021-01-06 DIAGNOSIS — S32401A Unspecified fracture of right acetabulum, initial encounter for closed fracture: Secondary | ICD-10-CM | POA: Diagnosis not present

## 2021-01-06 DIAGNOSIS — I739 Peripheral vascular disease, unspecified: Secondary | ICD-10-CM | POA: Diagnosis not present

## 2021-01-06 DIAGNOSIS — R262 Difficulty in walking, not elsewhere classified: Secondary | ICD-10-CM | POA: Diagnosis not present

## 2021-01-07 ENCOUNTER — Other Ambulatory Visit: Payer: Self-pay | Admitting: *Deleted

## 2021-01-07 NOTE — Patient Outreach (Signed)
Late entry for 01/06/21  [facility] Post- Acute Care Coordinator follow up. Member screened for potential [facility] Care Management needs. Per [place] (Patient Crystal Noble) member resides in [facility].   Communication sent to facility SW to inquire about transition plans.   Will continue to follow while member resides in SNF.    Marthenia Rolling, MSN, RN,BSN [place] Acute Care Coordinator [phone] [facility]) [phone]  (Toll free office)

## 2021-01-11 ENCOUNTER — Other Ambulatory Visit: Payer: Self-pay | Admitting: *Deleted

## 2021-01-11 DIAGNOSIS — I10 Essential (primary) hypertension: Secondary | ICD-10-CM

## 2021-01-11 NOTE — Patient Outreach (Signed)
[place] Coordinator follow up. Member screened for potential [facility] Care Management needs. Verified in [place] ( Patient Pearletha [place]) member transitioned to home from [facility] on 01/08/21 with [place].   Telephone call made to Mrs. Hurley Cisco [phone]. Patient identifiers confirmed. Explained [place] Management services. Explained [facility] Care Management will not replace or interfere with home health services. Mrs. Heiberg is agreeable.   Will make referral to [place] (central) for care coordination.   Mrs. Saccomanno lives alone. Has medical history of recent nondisplaced anterior column acetabular fracture, malnutrion, HTN, ischemic vascular disease.   Marthenia Rolling, MSN, RN,BSN [place] Acute Care Coordinator [phone] [facility]) [phone]  (Toll free office)

## 2021-01-12 DIAGNOSIS — U071 COVID-19: Secondary | ICD-10-CM | POA: Diagnosis not present

## 2021-01-12 DIAGNOSIS — J449 Chronic obstructive pulmonary disease, unspecified: Secondary | ICD-10-CM | POA: Diagnosis not present

## 2021-01-13 ENCOUNTER — Other Ambulatory Visit: Payer: Self-pay | Admitting: *Deleted

## 2021-01-13 DIAGNOSIS — U071 COVID-19: Secondary | ICD-10-CM | POA: Diagnosis not present

## 2021-01-13 DIAGNOSIS — J40 Bronchitis, not specified as acute or chronic: Secondary | ICD-10-CM | POA: Diagnosis not present

## 2021-01-13 NOTE — Patient Outreach (Signed)
[facility]) Care Management  01/13/2021  HENNIE MCCLYMONT 1946/05/28 [id number]  Received 8/1 Initial Outreach 8/3 Transition of Care-Unsuccessful  RN unsuccessful on the initial outreach however unsuccessful. RN able to leave a HIPAA approved voice message requesting a call back.  Will follow up once again over the next week.  Raina Mina, RN Care Management Coordinator [place] Office [phone]

## 2021-01-14 DIAGNOSIS — J40 Bronchitis, not specified as acute or chronic: Secondary | ICD-10-CM | POA: Diagnosis not present

## 2021-01-14 DIAGNOSIS — J449 Chronic obstructive pulmonary disease, unspecified: Secondary | ICD-10-CM | POA: Diagnosis not present

## 2021-01-14 DIAGNOSIS — U071 COVID-19: Secondary | ICD-10-CM | POA: Diagnosis not present

## 2021-01-15 DIAGNOSIS — U071 COVID-19: Secondary | ICD-10-CM | POA: Diagnosis not present

## 2021-01-18 DIAGNOSIS — U071 COVID-19: Secondary | ICD-10-CM | POA: Diagnosis not present

## 2021-01-19 ENCOUNTER — Encounter: Payer: Self-pay | Admitting: *Deleted

## 2021-01-19 ENCOUNTER — Other Ambulatory Visit: Payer: Self-pay | Admitting: *Deleted

## 2021-01-19 NOTE — Patient Outreach (Signed)
[facility]) Care Management  01/19/2021  RAEGHAN PAULINO Jun 11, 1946 [id number]      Received 8/1 Initial Outreach 8/3 Transition of Care-Successful  Outreach attempt #2, successful.  Identity verified.  This care manager introduced self and stated purpose of call.  [facility] care management services explained.    Member report she is doing "ok."  State she is recovering from Covid, otherwise no concerns.  She lives alone, state she is able to bathe, dress, and cook for herself. Does not use cane/walker for mobility.  Denies any pain or discomfort.  OT and PT has been placed on hold due to Covid infection.  She has had virtual visit with PCP, will call to schedule follow up with ortho specialist.    Attempted to discuss patient goals, state "I don't have any."  Inquired about education for Covid, declines state "I'll get over it when I get over it."  Offered to follow up with member for weekly TOC calls, she declines.  Also offered to have member in chronic care management program with monthly or quarterly calls, she declines.  Agrees to receive [facility] brochure and will call if she changes mind regarding involvement.  Will close case at this time, will notify assigned care manager of case closure.  Valente David, [place], MSN [place] [phone]

## 2021-02-26 DIAGNOSIS — F411 Generalized anxiety disorder: Secondary | ICD-10-CM | POA: Diagnosis not present

## 2021-02-26 DIAGNOSIS — I10 Essential (primary) hypertension: Secondary | ICD-10-CM | POA: Diagnosis not present

## 2021-02-26 DIAGNOSIS — G4700 Insomnia, unspecified: Secondary | ICD-10-CM | POA: Diagnosis not present

## 2021-02-26 DIAGNOSIS — F331 Major depressive disorder, recurrent, moderate: Secondary | ICD-10-CM | POA: Diagnosis not present

## 2021-03-03 DIAGNOSIS — F411 Generalized anxiety disorder: Secondary | ICD-10-CM | POA: Diagnosis not present

## 2021-03-18 DIAGNOSIS — G4700 Insomnia, unspecified: Secondary | ICD-10-CM | POA: Diagnosis not present

## 2021-03-18 DIAGNOSIS — F331 Major depressive disorder, recurrent, moderate: Secondary | ICD-10-CM | POA: Diagnosis not present

## 2021-03-18 DIAGNOSIS — Z1331 Encounter for screening for depression: Secondary | ICD-10-CM | POA: Diagnosis not present

## 2021-03-18 DIAGNOSIS — I10 Essential (primary) hypertension: Secondary | ICD-10-CM | POA: Diagnosis not present

## 2021-03-18 DIAGNOSIS — I2510 Atherosclerotic heart disease of native coronary artery without angina pectoris: Secondary | ICD-10-CM | POA: Diagnosis not present

## 2021-03-18 DIAGNOSIS — E46 Unspecified protein-calorie malnutrition: Secondary | ICD-10-CM | POA: Diagnosis not present

## 2021-03-18 DIAGNOSIS — Z1339 Encounter for screening examination for other mental health and behavioral disorders: Secondary | ICD-10-CM | POA: Diagnosis not present

## 2021-03-18 DIAGNOSIS — Z0000 Encounter for general adult medical examination without abnormal findings: Secondary | ICD-10-CM | POA: Diagnosis not present

## 2021-03-18 DIAGNOSIS — F411 Generalized anxiety disorder: Secondary | ICD-10-CM | POA: Diagnosis not present

## 2021-03-18 DIAGNOSIS — I779 Disorder of arteries and arterioles, unspecified: Secondary | ICD-10-CM | POA: Diagnosis not present

## 2021-03-25 DIAGNOSIS — Z23 Encounter for immunization: Secondary | ICD-10-CM | POA: Diagnosis not present

## 2021-05-27 DIAGNOSIS — M25551 Pain in right hip: Secondary | ICD-10-CM | POA: Diagnosis not present

## 2021-06-10 DIAGNOSIS — R634 Abnormal weight loss: Secondary | ICD-10-CM | POA: Diagnosis not present

## 2021-06-10 DIAGNOSIS — I10 Essential (primary) hypertension: Secondary | ICD-10-CM | POA: Diagnosis not present

## 2021-06-10 DIAGNOSIS — R5383 Other fatigue: Secondary | ICD-10-CM | POA: Diagnosis not present

## 2021-06-10 DIAGNOSIS — E559 Vitamin D deficiency, unspecified: Secondary | ICD-10-CM | POA: Diagnosis not present

## 2021-06-10 DIAGNOSIS — E782 Mixed hyperlipidemia: Secondary | ICD-10-CM | POA: Diagnosis not present

## 2021-06-17 ENCOUNTER — Ambulatory Visit: Payer: Medicare Other | Attending: Family Medicine

## 2021-06-17 ENCOUNTER — Other Ambulatory Visit: Payer: Self-pay

## 2021-06-17 DIAGNOSIS — R262 Difficulty in walking, not elsewhere classified: Secondary | ICD-10-CM | POA: Diagnosis not present

## 2021-06-17 DIAGNOSIS — M25551 Pain in right hip: Secondary | ICD-10-CM | POA: Insufficient documentation

## 2021-06-17 DIAGNOSIS — R2689 Other abnormalities of gait and mobility: Secondary | ICD-10-CM | POA: Diagnosis not present

## 2021-06-17 DIAGNOSIS — M6281 Muscle weakness (generalized): Secondary | ICD-10-CM | POA: Insufficient documentation

## 2021-06-17 NOTE — Therapy (Signed)
OUTPATIENT PHYSICAL THERAPY LOWER EXTREMITY EVALUATION   Patient Name: Crystal Noble MRN: [id number] DOB:Nov 29, 1945, 76 y.o., female Today's Date: 06/17/2021   PT End of Session - 06/17/21 1318     Visit Number 1    Number of Visits 9    Date for PT Re-Evaluation 08/14/21    Authorization Type MCR    PT Start Time 1318    PT Stop Time 1355    PT Time Calculation (min) 37 min    Activity Tolerance Patient tolerated treatment well    Behavior During Therapy Flat affect             Past Medical History:  Diagnosis Date   Carotid stenosis, asymptomatic    Colitis, collagenous    Hypertension    Stroke ([place]) 06/2019   Past Surgical History:  Procedure Laterality Date   APPENDECTOMY     Patient Active Problem List   Diagnosis Date Noted   Protein-calorie malnutrition, severe 12/01/2020   Mild cognitive impairment 11/30/2020   Right acetabular fracture ([place]) 11/29/2020   Hypokalemia 06/26/2020   Hypomagnesemia 06/26/2020   Alcohol abuse 06/26/2020   Anxiety disorder 06/26/2020   COPD (chronic obstructive pulmonary disease) ([place]) 06/26/2020   Falls 06/26/2020   Multiple falls    AKI (acute kidney injury) ([place]) 06/25/2020   PAD (peripheral artery disease) ([place]) 08/14/2019   Mixed hyperlipidemia 08/14/2019   Exertional dyspnea 05/13/2019   Coronary artery disease involving native coronary artery of native heart without angina pectoris 03/27/2019   Aortic atherosclerosis ([place]) 03/27/2019   Tobacco abuse 03/27/2019   Essential hypertension 03/27/2019   Asymptomatic carotid artery stenosis without infarction 03/27/2014    PCP: Fanny Bien, MD  REFERRING PROVIDER: Fanny Bien, MD  REFERRING DIAG: Hip Pain  THERAPY DIAG:  Pain in right hip  Other abnormalities of gait and mobility  Difficulty in walking, not elsewhere classified  Muscle weakness (generalized)  ONSET DATE: 11/29/20  SUBJECTIVE:   SUBJECTIVE STATEMENT: Patient reports she  broke her Rt hip in June 2022 and it hurts when she "uses her hip". She reports pain increases to 5/10 when she immediately starts walking, though does not increase with further walking. Following her hospitalization she underwent PT for 2 months in a SNF. She received exercises when she was discharged, but has not been completing them.   PERTINENT HISTORY: Fall in 2022 that resulted in Rt acetabulum fracture, 2 months of rehab in SNF; see PMH above   PAIN:  Are you having pain? Yes VAS scale: 2/10 Pain location: Posterior right hip  PAIN TYPE: dull Pain description: constant  Aggravating factors: walking Relieving factors: rest   PRECAUTIONS: fall risk   WEIGHT BEARING RESTRICTIONS No  FALLS:  Has patient fallen in last 6 months? No, Number of falls: 0  LIVING ENVIRONMENT: Lives with: lives alone Lives in: House/apartment Stairs: Yes; 6 stairs, handrails bilaterally  Has following equipment at home: Single point cane and Walker - 2 wheeled  OCCUPATION: retired   PLOF: [place] "I want to be able to walk without pain."    OBJECTIVE:   DIAGNOSTIC FINDINGS: Rt Hip MRI 11/29/20: IMPRESSION: 1. Acute nondisplaced fracture of the right acetabulum involving the acetabular roof and anterior acetabulum. Small right hip joint effusion. 2. Partial tear of the iliofemoral ligament from its iliac attachment.  PATIENT SURVEYS:  FOTO 58% function to 72% predicted   COGNITION:  Overall cognitive status: Within functional limits for tasks assessed  SENSATION:  Not assessed    MUSCLE LENGTH: Hamstrings: moderate tightness bilaterally Quadriceps: minimal tightness bilaterally   POSTURE:  Kyphotic, forward head rounded, shoulders  OBSERVATION: Petite build, decreased muscle mass   PALPATION: TTP glute max along sacral attachment   LE AROM/PROM:  A/PROM Right 06/17/2021 Left 06/17/2021  Hip flexion WNL, pain along posterior hip at end range   Hip  extension    Hip abduction    Hip adduction    Hip internal rotation WNL, pain free   Hip external rotation WNL, pain free    Knee flexion    Knee extension    Ankle dorsiflexion    Ankle plantarflexion    Ankle inversion    Ankle eversion     (Blank rows = not tested)  LE MMT:  MMT Right 06/17/2021 Left 06/17/2021  Hip flexion 4- 4-  Hip extension 3- 3+  Hip abduction 3+ 4-  Hip adduction    Hip internal rotation 4 4+  Hip external rotation 4 4+  Knee flexion 5 5  Knee extension 5 5  Ankle dorsiflexion    Ankle plantarflexion    Ankle inversion    Ankle eversion     (Blank rows = not tested)  LOWER EXTREMITY SPECIAL TESTS:  (+) FABER RLE   FUNCTIONAL TESTS:  5 times sit to stand: 19 seconds  TUG: 18 seconds SLS: <1 second bilaterally; hesitant to perform  GAIT:  Assistive device utilized: None Level of assistance: Modified independence Comments: slow velocity, decreased step length bilaterally, no arm swing, no trunk movement     TODAY'S TREATMENT: [facility] Adult PT Treatment:                                                DATE: 06/17/21 Therapeutic Exercise: Demonstrated and issued HEP.  Manual Therapy: N/A Neuromuscular re-ed: N/A Therapeutic Activity: Education on assessment findings and POC.  Modalities: N/A Self Care: N/A    PATIENT EDUCATION:  Education details: See treatment  Person educated: Patient Education method: Explanation, Demonstration, Verbal cues, and Handouts Education comprehension: verbalized understanding, returned demonstration, verbal cues required, and needs further education   HOME EXERCISE PROGRAM: Access Code: [email or web address]  ASSESSMENT:  CLINICAL IMPRESSION: Patient is a 76 y.o. female who was seen today for physical therapy evaluation and treatment for Rt hip pain that began in June 2022 from a fall where she sustained a fracture of the right acetabulum that was treated non-operatively. Overall she has good ROM about the Rt  hip, though reports pain at end range of hip flexion. She has bilateral hip weakness, though more significant on the right. She demonstrates gait abnormalities and balance deficits as well as scoring at an increased fall risk based upon her 5xSTS and TUG. Personal factors including Age, Fitness, and Time since onset of injury/illness/exacerbation are also affecting patient's functional outcome. She will benefit from skilled PT to address the above stated deficits in order to optimize her function and reduce her risk of future falls.   REHAB POTENTIAL: Fair    CLINICAL DECISION MAKING: Stable/uncomplicated  EVALUATION COMPLEXITY: Low   GOALS: Goals reviewed with patient? No  SHORT TERM GOALS:  STG Name Target Date Goal status  1 Patient will be independent and compliant with initial HEP.   Baseline:  07/01/2021 INITIAL  2 Patient will complete 5 x STS in <15  seconds to signify improvements in functional strength.  Baseline:  07/15/2021 INITIAL  3 Patient will complete TUG in <15 seconds to signify improvements in gait speed and overall reduction in fall risk.  Baseline: 07/15/2021 INITIAL   LONG TERM GOALS:   LTG Name Target Date Goal status  1 Patient will tolerate at least 10 minutes of walking with hip pain </= 2/10 to improve ability to complete household tasks.  Baseline: 08/12/2021 INITIAL  2 Patient will maintain SLS for at least 3 seconds bilaterally to improve overall gait stability.  Baseline: 08/12/2021 INITIAL  3 Patient will demonstrate at least 4/5 Rt hip strength to improve stability about the chain with walking activity.  Baseline: 08/12/2021 INITIAL   PLAN: PT FREQUENCY: 1x/week  PT DURATION: 8 weeks  PLANNED INTERVENTIONS: Therapeutic exercises, Therapeutic activity, Neuro Muscular re-education, Balance training, Gait training, Patient/Family education, Stair training, Cryotherapy, Moist heat, and Manual therapy  PLAN FOR NEXT SESSION: review HEP, review FOTO, hip  strengthening, static balance, gait training   Gwendolyn Grant, PT, DPT, ATC 06/17/21 4:24 PM

## 2021-06-21 NOTE — Therapy (Signed)
OUTPATIENT PHYSICAL THERAPY TREATMENT NOTE   Patient Name: Crystal Noble MRN: [id number] DOB:23-Mar-1946, 76 y.o., female Today's Date: 06/22/2021  PCP: Fanny Bien, MD REFERRING PROVIDER: Fanny Bien, MD   PT End of Session - 06/22/21 1315     Visit Number 2    Number of Visits 9    Date for PT Re-Evaluation 08/14/21    Authorization Type MCR    PT Start Time 1315    Activity Tolerance Patient tolerated treatment well    Behavior During Therapy Flat affect             Past Medical History:  Diagnosis Date   Carotid stenosis, asymptomatic    Colitis, collagenous    Hypertension    Stroke ([place]) 06/2019   Past Surgical History:  Procedure Laterality Date   APPENDECTOMY     Patient Active Problem List   Diagnosis Date Noted   Protein-calorie malnutrition, severe 12/01/2020   Mild cognitive impairment 11/30/2020   Right acetabular fracture ([place]) 11/29/2020   Hypokalemia 06/26/2020   Hypomagnesemia 06/26/2020   Alcohol abuse 06/26/2020   Anxiety disorder 06/26/2020   COPD (chronic obstructive pulmonary disease) ([place]) 06/26/2020   Falls 06/26/2020   Multiple falls    AKI (acute kidney injury) ([place]) 06/25/2020   PAD (peripheral artery disease) ([place]) 08/14/2019   Mixed hyperlipidemia 08/14/2019   Exertional dyspnea 05/13/2019   Coronary artery disease involving native coronary artery of native heart without angina pectoris 03/27/2019   Aortic atherosclerosis ([place]) 03/27/2019   Tobacco abuse 03/27/2019   Essential hypertension 03/27/2019   Asymptomatic carotid artery stenosis without infarction 03/27/2014    REFERRING DIAG: Hip pain   THERAPY DIAG:  Pain in right hip  Other abnormalities of gait and mobility  Difficulty in walking, not elsewhere classified  Muscle weakness (generalized)  ONSET DATE: 11/29/20   SUBJECTIVE:    SUBJECTIVE STATEMENT: Patient reports her hip feels the same. She hasn't completed her HEP that much because  she has been tired.    PERTINENT HISTORY: Fall in 2022 that resulted in Rt acetabulum fracture, 2 months of rehab in SNF; see PMH above    PAIN:  Are you having pain? Yes VAS scale: 5/10 Pain location: Posterior right hip  PAIN TYPE: dull Pain description: constant  Aggravating factors: walking Relieving factors: rest    PRECAUTIONS: fall risk    WEIGHT BEARING RESTRICTIONS No   OBJECTIVE:   *Unless noted all objective measurements were obtained on initial evaluation*  DIAGNOSTIC FINDINGS: Rt Hip MRI 11/29/20: IMPRESSION: 1. Acute nondisplaced fracture of the right acetabulum involving the acetabular roof and anterior acetabulum. Small right hip joint effusion. 2. Partial tear of the iliofemoral ligament from its iliac attachment.   PATIENT SURVEYS:  FOTO 58% function to 72% predicted    COGNITION:          Overall cognitive status: Within functional limits for tasks assessed                        SENSATION:          Not assessed            MUSCLE LENGTH: Hamstrings: moderate tightness bilaterally Quadriceps: minimal tightness bilaterally     POSTURE:  Kyphotic, forward head rounded, shoulders   OBSERVATION: Petite build, decreased muscle mass    PALPATION: TTP glute max along sacral attachment    LE AROM/PROM:   A/PROM Right 06/17/2021 Left 06/17/2021  Hip flexion  WNL, pain along posterior hip at end range    Hip extension      Hip abduction      Hip adduction      Hip internal rotation WNL, pain free    Hip external rotation WNL, pain free     Knee flexion      Knee extension      Ankle dorsiflexion      Ankle plantarflexion      Ankle inversion      Ankle eversion       (Blank rows = not tested)   LE MMT:   MMT Right 06/17/2021 Left 06/17/2021  Hip flexion 4- 4-  Hip extension 3- 3+  Hip abduction 3+ 4-  Hip adduction      Hip internal rotation 4 4+  Hip external rotation 4 4+  Knee flexion 5 5  Knee extension 5 5  Ankle dorsiflexion       Ankle plantarflexion      Ankle inversion      Ankle eversion       (Blank rows = not tested)   LOWER EXTREMITY SPECIAL TESTS:  (+) FABER RLE    FUNCTIONAL TESTS:  5 times sit to stand: 19 seconds           TUG: 18 seconds SLS: <1 second bilaterally; hesitant to perform   GAIT:   Assistive device utilized: None Level of assistance: Modified independence Comments: slow velocity, decreased step length bilaterally, no arm swing, no trunk movement        TODAY'S TREATMENT: [facility] Adult PT Treatment:                                                DATE: 06/22/21 Therapeutic Exercise: Nustep Level 4 LE only x 5 minutes; for aerobic endurance  Hip bridge 2 x 10  Resisted hip abduction 2 x 15 green band  Clamshells 2 x 10 green band  SLR 2 x 10 bilateral  Single knee to chest 30 sec bilateral Figure 4 stretch 30 sec bilateral  Sidelying hip abduction partial range 2 x 10 bilateral  Seated march with green band 2 x 10  Sit to stand 2 x 10; first set no weight, second set 5 lb kettlebell Standing calf raise 2 x 15  Standing hip extension 2 x 10     [facility] Adult PT Treatment:                                                DATE: 06/17/21 Therapeutic Exercise: Demonstrated and issued HEP.  Manual Therapy: N/A Neuromuscular re-ed: N/A Therapeutic Activity: Education on assessment findings and POC.  Modalities: N/A Self Care: N/A      PATIENT EDUCATION:  Education details: Educated on importance of completing home program Person educated: Patient Education method: Explanation,  Education comprehension: verbalized understanding,     HOME EXERCISE PROGRAM: Access Code: [email or web address]   ASSESSMENT:   CLINICAL IMPRESSION: Patient arrives to PT with no change in her hip pain, though admits non-adherence to HEP. Educated patient on importance of completing prescribed HEP to assist in overall pain reduction and improvements in functional abilities. Patient reported she did not think  she could complete 5  minutes on the Nustep initially due to fatigue, however she was able to complete for 5 minutes at a slow pace without need for rest break and no increase in hip pain. Session focused on hip strengthening, which she tolerated well without increased pain. With SLR on the RLE she requires consistent cues to control for quad lag. She has difficulty maintaining lumbopelvic stability with targeted hip abductor strengthening. No change in hip pain at end of session.   REHAB POTENTIAL: Fair     CLINICAL DECISION MAKING: Stable/uncomplicated   EVALUATION COMPLEXITY: Low     GOALS: Goals reviewed with patient? No   SHORT TERM GOALS:   STG Name Target Date Goal status  1 Patient will be independent and compliant with initial HEP.    Baseline:  07/01/2021 INITIAL  2 Patient will complete 5 x STS in <15 seconds to signify improvements in functional strength.  Baseline:  07/15/2021 INITIAL  3 Patient will complete TUG in <15 seconds to signify improvements in gait speed and overall reduction in fall risk.  Baseline: 07/15/2021 INITIAL    LONG TERM GOALS:    LTG Name Target Date Goal status  1 Patient will tolerate at least 10 minutes of walking with hip pain </= 2/10 to improve ability to complete household tasks.  Baseline: 08/12/2021 INITIAL  2 Patient will maintain SLS for at least 3 seconds bilaterally to improve overall gait stability.  Baseline: 08/12/2021 INITIAL  3 Patient will demonstrate at least 4/5 Rt hip strength to improve stability about the chain with walking activity.  Baseline: 08/12/2021 INITIAL    PLAN: PT FREQUENCY: 1x/week   PT DURATION: 8 weeks   PLANNED INTERVENTIONS: Therapeutic exercises, Therapeutic activity, Neuro Muscular re-education, Balance training, Gait training, Patient/Family education, Stair training, Cryotherapy, Moist heat, and Manual therapy   PLAN FOR NEXT SESSION: has patient adhered to HEP?, review FOTO, hip strengthening, static balance,  gait training     Gwendolyn Grant, PT, DPT, ATC 06/22/21 1:30 PM

## 2021-06-22 ENCOUNTER — Other Ambulatory Visit: Payer: Self-pay

## 2021-06-22 ENCOUNTER — Ambulatory Visit: Payer: Medicare Other

## 2021-06-22 DIAGNOSIS — M25551 Pain in right hip: Secondary | ICD-10-CM

## 2021-06-22 DIAGNOSIS — M6281 Muscle weakness (generalized): Secondary | ICD-10-CM

## 2021-06-22 DIAGNOSIS — R262 Difficulty in walking, not elsewhere classified: Secondary | ICD-10-CM

## 2021-06-22 DIAGNOSIS — R2689 Other abnormalities of gait and mobility: Secondary | ICD-10-CM | POA: Diagnosis not present

## 2021-06-29 NOTE — Therapy (Signed)
OUTPATIENT PHYSICAL THERAPY TREATMENT NOTE   Patient Name: Crystal Noble MRN: [id number] DOB:[birth year], 76 y.o., female Today's Date: 06/30/2021  PCP: Fanny Bien, MD REFERRING PROVIDER: Fanny Bien, MD   PT End of Session - 06/30/21 1357     Visit Number 3    Number of Visits 9    Date for PT Re-Evaluation 08/14/21    Authorization Type MCR    PT Start Time 1400    PT Stop Time 1445    PT Time Calculation (min) 45 min    Equipment Utilized During Treatment Gait belt    Activity Tolerance Patient tolerated treatment well    Behavior During Therapy Flat affect              Past Medical History:  Diagnosis Date   Carotid stenosis, asymptomatic    Colitis, collagenous    Hypertension    Stroke ([place]) 06/2019   Past Surgical History:  Procedure Laterality Date   APPENDECTOMY     Patient Active Problem List   Diagnosis Date Noted   Protein-calorie malnutrition, severe 12/01/2020   Mild cognitive impairment 11/30/2020   Right acetabular fracture ([place]) 11/29/2020   Hypokalemia 06/26/2020   Hypomagnesemia 06/26/2020   Alcohol abuse 06/26/2020   Anxiety disorder 06/26/2020   COPD (chronic obstructive pulmonary disease) ([place]) 06/26/2020   Falls 06/26/2020   Multiple falls    AKI (acute kidney injury) ([place]) 06/25/2020   PAD (peripheral artery disease) ([place]) 08/14/2019   Mixed hyperlipidemia 08/14/2019   Exertional dyspnea 05/13/2019   Coronary artery disease involving native coronary artery of native heart without angina pectoris 03/27/2019   Aortic atherosclerosis ([place]) 03/27/2019   Tobacco abuse 03/27/2019   Essential hypertension 03/27/2019   Asymptomatic carotid artery stenosis without infarction 03/27/2014    REFERRING DIAG: Hip pain   THERAPY DIAG:  Pain in right hip  Difficulty in walking, not elsewhere classified  Muscle weakness (generalized)  ONSET DATE: 11/29/20   SUBJECTIVE:    SUBJECTIVE STATEMENT: Pt reports 0/10  current pain, reporting only having pain when moving her Rt hip. She reports her HEP has been going well at home.   PERTINENT HISTORY: Fall in 2022 that resulted in Rt acetabulum fracture, 2 months of rehab in SNF; see PMH above    PAIN:  Are you having pain? Yes VAS scale: 5/10 Pain location: Posterior right hip  PAIN TYPE: dull Pain description: constant  Aggravating factors: walking Relieving factors: rest    PRECAUTIONS: fall risk    WEIGHT BEARING RESTRICTIONS No   OBJECTIVE:   *Unless noted all objective measurements were obtained on initial evaluation*  DIAGNOSTIC FINDINGS: Rt Hip MRI 11/29/20: IMPRESSION: 1. Acute nondisplaced fracture of the right acetabulum involving the acetabular roof and anterior acetabulum. Small right hip joint effusion. 2. Partial tear of the iliofemoral ligament from its iliac attachment.   PATIENT SURVEYS:  FOTO 58% function to 72% predicted    COGNITION:          Overall cognitive status: Within functional limits for tasks assessed                        SENSATION:          Not assessed            MUSCLE LENGTH: Hamstrings: moderate tightness bilaterally Quadriceps: minimal tightness bilaterally     POSTURE:  Kyphotic, forward head rounded, shoulders   OBSERVATION: Petite build, decreased muscle mass  PALPATION: TTP glute max along sacral attachment    LE AROM/PROM:   A/PROM Right 06/17/2021 Left 06/17/2021  Hip flexion WNL, pain along posterior hip at end range    Hip extension      Hip abduction      Hip adduction      Hip internal rotation WNL, pain free    Hip external rotation WNL, pain free     Knee flexion      Knee extension      Ankle dorsiflexion      Ankle plantarflexion      Ankle inversion      Ankle eversion       (Blank rows = not tested)   LE MMT:   MMT Right 06/17/2021 Left 06/17/2021  Hip flexion 4- 4-  Hip extension 3- 3+  Hip abduction 3+ 4-  Hip adduction      Hip internal rotation 4 4+   Hip external rotation 4 4+  Knee flexion 5 5  Knee extension 5 5  Ankle dorsiflexion      Ankle plantarflexion      Ankle inversion      Ankle eversion       (Blank rows = not tested)   LOWER EXTREMITY SPECIAL TESTS:  (+) FABER RLE    FUNCTIONAL TESTS:  5 times sit to stand: 19 seconds           TUG: 18 seconds SLS: <1 second bilaterally; hesitant to perform   GAIT:   Assistive device utilized: None Level of assistance: Modified independence Comments: slow velocity, decreased step length bilaterally, no arm swing, no trunk movement        TODAY'S TREATMENT:  [facility] Adult PT Treatment:                                                DATE: 06/30/2021 Therapeutic Exercise: Supine bridge with adduction pillow squeeze 3x10 with 3-sec hold Mini-squat side stepping in // bars 3x2 laps Dead lift with two 5# kettle bells, 3x8 Standing Cybex hip abduction with 12.5#, 2x10 BIL Standing Cybex hip extension with 12.5#, 2x10 BIL Cybex leg press with 20#, 3x10 Standing Pallof press with 3# cable 2x10 BIL Manual Therapy: Manual lateral hip distraction with belt x5 minutes with gentle oscillations on Rt Manual long-axis hip distraction x3 minutes with gentle oscillations on Rt Neuromuscular re-ed: N/A Therapeutic Activity: N/A Modalities: N/A Self Care: N/A   [facility] Adult PT Treatment:                                                DATE: 06/22/21 Therapeutic Exercise: Nustep Level 4 LE only x 5 minutes; for aerobic endurance  Hip bridge 2 x 10  Resisted hip abduction 2 x 15 green band  Clamshells 2 x 10 green band  SLR 2 x 10 bilateral  Single knee to chest 30 sec bilateral Figure 4 stretch 30 sec bilateral  Sidelying hip abduction partial range 2 x 10 bilateral  Seated march with green band 2 x 10  Sit to stand 2 x 10; first set no weight, second set 5 lb kettlebell Standing calf raise 2 x 15  Standing hip extension 2 x 10  [facility] Adult PT Treatment:                                                 DATE: 06/17/21 Therapeutic Exercise: Demonstrated and issued HEP.  Manual Therapy: N/A Neuromuscular re-ed: N/A Therapeutic Activity: Education on assessment findings and POC.  Modalities: N/A Self Care: N/A      PATIENT EDUCATION:  Education details: Educated pt on importance of continued daily HEP adherence Person educated: Patient Education method: Consulting civil engineer,  Education comprehension: verbalized understanding,     HOME EXERCISE PROGRAM: Access Code: [email or web address]   ASSESSMENT:   CLINICAL IMPRESSION: Pt responded well to all interventions today, demonstrating good form and no increase in pain with newly added exercises. She shows good foundational hip strength as indicated by her ability to perform loaded closed chain hip strengthening exercises today. She will continue to benefit from skilled PT to address her primary impairments and return to her prior level of function with less limitation.  REHAB POTENTIAL: Fair     CLINICAL DECISION MAKING: Stable/uncomplicated   EVALUATION COMPLEXITY: Low     GOALS: Goals reviewed with patient? No   SHORT TERM GOALS:   STG Name Target Date Goal status  1 Patient will be independent and compliant with initial HEP.    Baseline:  07/01/2021 INITIAL  2 Patient will complete 5 x STS in <15 seconds to signify improvements in functional strength.  Baseline:  07/15/2021 INITIAL  3 Patient will complete TUG in <15 seconds to signify improvements in gait speed and overall reduction in fall risk.  Baseline: 07/15/2021 INITIAL    LONG TERM GOALS:    LTG Name Target Date Goal status  1 Patient will tolerate at least 10 minutes of walking with hip pain </= 2/10 to improve ability to complete household tasks.  Baseline: 08/12/2021 INITIAL  2 Patient will maintain SLS for at least 3 seconds bilaterally to improve overall gait stability.  Baseline: 08/12/2021 INITIAL  3 Patient will demonstrate at least 4/5 Rt hip strength  to improve stability about the chain with walking activity.  Baseline: 08/12/2021 INITIAL    PLAN: PT FREQUENCY: 1x/week   PT DURATION: 8 weeks   PLANNED INTERVENTIONS: Therapeutic exercises, Therapeutic activity, Neuro Muscular re-education, Balance training, Gait training, Patient/Family education, Stair training, Cryotherapy, Moist heat, and Manual therapy   PLAN FOR NEXT SESSION: hip strengthening, static balance, gait training     Vanessa Calmar, PT, DPT 06/30/21 2:43 PM

## 2021-06-30 ENCOUNTER — Other Ambulatory Visit: Payer: Self-pay

## 2021-06-30 ENCOUNTER — Ambulatory Visit: Payer: Medicare Other

## 2021-06-30 DIAGNOSIS — R262 Difficulty in walking, not elsewhere classified: Secondary | ICD-10-CM

## 2021-06-30 DIAGNOSIS — M6281 Muscle weakness (generalized): Secondary | ICD-10-CM

## 2021-06-30 DIAGNOSIS — R2689 Other abnormalities of gait and mobility: Secondary | ICD-10-CM | POA: Diagnosis not present

## 2021-06-30 DIAGNOSIS — M25551 Pain in right hip: Secondary | ICD-10-CM | POA: Diagnosis not present

## 2021-07-07 ENCOUNTER — Ambulatory Visit: Payer: Medicare Other

## 2021-07-07 ENCOUNTER — Other Ambulatory Visit: Payer: Self-pay

## 2021-07-07 DIAGNOSIS — M6281 Muscle weakness (generalized): Secondary | ICD-10-CM

## 2021-07-07 DIAGNOSIS — R262 Difficulty in walking, not elsewhere classified: Secondary | ICD-10-CM

## 2021-07-07 DIAGNOSIS — R2689 Other abnormalities of gait and mobility: Secondary | ICD-10-CM

## 2021-07-07 DIAGNOSIS — M25551 Pain in right hip: Secondary | ICD-10-CM | POA: Diagnosis not present

## 2021-07-07 NOTE — Therapy (Signed)
OUTPATIENT PHYSICAL THERAPY TREATMENT NOTE   Patient Name: Crystal Noble MRN: [id number] DOB:[birth year], 76 y.o., female Today's Date: 07/07/2021  PCP: Fanny Bien, MD REFERRING PROVIDER: Fanny Bien, MD   PT End of Session - 07/07/21 1314     Visit Number 4    Number of Visits 9    Date for PT Re-Evaluation 08/14/21    Authorization Type MCR    PT Start Time 1315    PT Stop Time 1355    PT Time Calculation (min) 40 min    Equipment Utilized During Treatment Gait belt    Activity Tolerance Patient tolerated treatment well    Behavior During Therapy Flat affect              Past Medical History:  Diagnosis Date   Carotid stenosis, asymptomatic    Colitis, collagenous    Hypertension    Stroke ([place]) 06/2019   Past Surgical History:  Procedure Laterality Date   APPENDECTOMY     Patient Active Problem List   Diagnosis Date Noted   Protein-calorie malnutrition, severe 12/01/2020   Mild cognitive impairment 11/30/2020   Right acetabular fracture ([place]) 11/29/2020   Hypokalemia 06/26/2020   Hypomagnesemia 06/26/2020   Alcohol abuse 06/26/2020   Anxiety disorder 06/26/2020   COPD (chronic obstructive pulmonary disease) ([place]) 06/26/2020   Falls 06/26/2020   Multiple falls    AKI (acute kidney injury) ([place]) 06/25/2020   PAD (peripheral artery disease) ([place]) 08/14/2019   Mixed hyperlipidemia 08/14/2019   Exertional dyspnea 05/13/2019   Coronary artery disease involving native coronary artery of native heart without angina pectoris 03/27/2019   Aortic atherosclerosis ([place]) 03/27/2019   Tobacco abuse 03/27/2019   Essential hypertension 03/27/2019   Asymptomatic carotid artery stenosis without infarction 03/27/2014    REFERRING DIAG: Hip pain   THERAPY DIAG:  Pain in right hip  Difficulty in walking, not elsewhere classified  Muscle weakness (generalized)  Other abnormalities of gait and mobility  ONSET DATE: 11/29/20   SUBJECTIVE:     SUBJECTIVE STATEMENT: Patient reports she is feeling ok. No pain currently, but the hip feels stiff. She reports compliance with HEP.    PERTINENT HISTORY: Fall in 2022 that resulted in Rt acetabulum fracture, 2 months of rehab in SNF; see PMH above    PAIN:  Are you having pain? No VAS scale: 0/10 Pain location: N/A PAIN TYPE: N/A Pain description: n/a Aggravating factors: n/a Relieving factors: n/a   PRECAUTIONS: fall risk    WEIGHT BEARING RESTRICTIONS No   OBJECTIVE:   *Unless noted all objective measurements were obtained on initial evaluation*  DIAGNOSTIC FINDINGS: Rt Hip MRI 11/29/20: IMPRESSION: 1. Acute nondisplaced fracture of the right acetabulum involving the acetabular roof and anterior acetabulum. Small right hip joint effusion. 2. Partial tear of the iliofemoral ligament from its iliac attachment.   PATIENT SURVEYS:  FOTO 58% function to 72% predicted    COGNITION:          Overall cognitive status: Within functional limits for tasks assessed                        SENSATION:          Not assessed            MUSCLE LENGTH: Hamstrings: moderate tightness bilaterally Quadriceps: minimal tightness bilaterally     POSTURE:  Kyphotic, forward head rounded, shoulders   OBSERVATION: Petite build, decreased muscle mass    PALPATION:  TTP glute max along sacral attachment    LE AROM/PROM:   A/PROM Right 06/17/2021 Left 06/17/2021  Hip flexion WNL, pain along posterior hip at end range    Hip extension      Hip abduction      Hip adduction      Hip internal rotation WNL, pain free    Hip external rotation WNL, pain free     Knee flexion      Knee extension      Ankle dorsiflexion      Ankle plantarflexion      Ankle inversion      Ankle eversion       (Blank rows = not tested)   LE MMT:   MMT Right 06/17/2021 Left 06/17/2021 07/07/21  Hip flexion 4- 4- 4+/5 bilateral   Hip extension 3- 3+   Hip abduction 3+ 4-   Hip adduction       Hip  internal rotation 4 4+   Hip external rotation 4 4+   Knee flexion 5 5   Knee extension 5 5   Ankle dorsiflexion       Ankle plantarflexion       Ankle inversion       Ankle eversion        (Blank rows = not tested)   LOWER EXTREMITY SPECIAL TESTS:  (+) FABER RLE    FUNCTIONAL TESTS:  5 times sit to stand: 19 seconds; 07/07/21 17.5 seconds            TUG: 18 seconds SLS: <1 second bilaterally; hesitant to perform   GAIT:   Assistive device utilized: None Level of assistance: Modified independence Comments: slow velocity, decreased step length bilaterally, no arm swing, no trunk movement        TODAY'S TREATMENT: [facility] Adult PT Treatment:                                                DATE: 07/07/21 Therapeutic Exercise: Sit to stand 10 lb kettle bell 2 x 10  Standing resisted hip extension and abduction 2 x 10 green band Sidelying hip circles CW/CCW 2 x 10 bilateral  LAQ 2 x 15; 3 lbs Step ups 2 x 10 bilateral; 4 inch step; single UE support Standing calf raise 2 x 15  Updated HEP  Neuromuscular re-ed: Romberg eyes closed 3 x 30 sec Semi-tandem 3 x 30 sec each Tandem attempted unable Lateral step ups on airex 2 x 10 BUE support     [facility] Adult PT Treatment:                                                DATE: 06/30/2021 Therapeutic Exercise: Supine bridge with adduction pillow squeeze 3x10 with 3-sec hold Mini-squat side stepping in // bars 3x2 laps Dead lift with two 5# kettle bells, 3x8 Standing Cybex hip abduction with 12.5#, 2x10 BIL Standing Cybex hip extension with 12.5#, 2x10 BIL Cybex leg press with 20#, 3x10 Standing Pallof press with 3# cable 2x10 BIL Manual Therapy: Manual lateral hip distraction with belt x5 minutes with gentle oscillations on Rt Manual long-axis hip distraction x3 minutes with gentle oscillations on Rt Neuromuscular re-ed: N/A Therapeutic Activity: N/A Modalities: N/A  Self Care: N/A   [facility] Adult PT Treatment:                                                 DATE: 06/22/21 Therapeutic Exercise: Nustep Level 4 LE only x 5 minutes; for aerobic endurance  Hip bridge 2 x 10  Resisted hip abduction 2 x 15 green band  Clamshells 2 x 10 green band  SLR 2 x 10 bilateral  Single knee to chest 30 sec bilateral Figure 4 stretch 30 sec bilateral  Sidelying hip abduction partial range 2 x 10 bilateral  Seated march with green band 2 x 10  Sit to stand 2 x 10; first set no weight, second set 5 lb kettlebell Standing calf raise 2 x 15  Standing hip extension 2 x 10     [facility] Adult PT Treatment:                                                DATE: 06/17/21 Therapeutic Exercise: Demonstrated and issued HEP.  Manual Therapy: N/A Neuromuscular re-ed: N/A Therapeutic Activity: Education on assessment findings and POC.  Modalities: N/A Self Care: N/A      PATIENT EDUCATION:  Education details: see treatment  Person educated: Patient Education method: Explanation, demonstration, verbal cues, handout provided  Education comprehension: verbalized understanding, returned demonstration      HOME EXERCISE PROGRAM: Access Code: [email or web address]   ASSESSMENT:   CLINICAL IMPRESSION: Patient tolerated session well today with continued progression of hip strengthening and introduction to static balance activity. No reports of hip pain throughout session. She demonstrates slight improvement in 5xSTS compared to initial evaluation nearing this short term goal. She demonstrates good control with static balance with the exception of tandem stance as she was unable to maintain due to LOB.   REHAB POTENTIAL: Fair     CLINICAL DECISION MAKING: Stable/uncomplicated   EVALUATION COMPLEXITY: Low     GOALS: Goals reviewed with patient? No   SHORT TERM GOALS:   STG Name Target Date Goal status  1 Patient will be independent and compliant with initial HEP.    Baseline:  07/01/2021 Achieved   2 Patient will complete 5 x STS in <15  seconds to signify improvements in functional strength.  Baseline:  07/15/2021 Ongoing   3 Patient will complete TUG in <15 seconds to signify improvements in gait speed and overall reduction in fall risk.  Baseline: 07/15/2021 INITIAL    LONG TERM GOALS:    LTG Name Target Date Goal status  1 Patient will tolerate at least 10 minutes of walking with hip pain </= 2/10 to improve ability to complete household tasks.  Baseline: 08/12/2021 INITIAL  2 Patient will maintain SLS for at least 3 seconds bilaterally to improve overall gait stability.  Baseline: 08/12/2021 INITIAL  3 Patient will demonstrate at least 4/5 Rt hip strength to improve stability about the chain with walking activity.  Baseline: 08/12/2021 INITIAL    PLAN: PT FREQUENCY: 1x/week   PT DURATION: 8 weeks   PLANNED INTERVENTIONS: Therapeutic exercises, Therapeutic activity, Neuro Muscular re-education, Balance training, Gait training, Patient/Family education, Stair training, Cryotherapy, Moist heat, and Manual therapy   PLAN FOR NEXT SESSION: hip strengthening, static balance,  gait training    Gwendolyn Grant, PT, DPT, ATC 07/07/21 2:02 PM

## 2021-07-13 NOTE — Therapy (Signed)
OUTPATIENT PHYSICAL THERAPY TREATMENT NOTE   Patient Name: Crystal Noble MRN: [id number] DOB:1946/02/17, 76 y.o., female Today's Date: 07/14/2021  PCP: Fanny Bien, MD REFERRING PROVIDER: Fanny Bien, MD   PT End of Session - 07/14/21 1357     Visit Number 5    Number of Visits 9    Date for PT Re-Evaluation 08/14/21    Authorization Type MCR    PT Start Time 1400    PT Stop Time 1445    PT Time Calculation (min) 45 min    Equipment Utilized During Treatment Gait belt    Activity Tolerance Patient tolerated treatment well    Behavior During Therapy Flat affect               Past Medical History:  Diagnosis Date   Carotid stenosis, asymptomatic    Colitis, collagenous    Hypertension    Stroke ([place]) 06/2019   Past Surgical History:  Procedure Laterality Date   APPENDECTOMY     Patient Active Problem List   Diagnosis Date Noted   Protein-calorie malnutrition, severe 12/01/2020   Mild cognitive impairment 11/30/2020   Right acetabular fracture ([place]) 11/29/2020   Hypokalemia 06/26/2020   Hypomagnesemia 06/26/2020   Alcohol abuse 06/26/2020   Anxiety disorder 06/26/2020   COPD (chronic obstructive pulmonary disease) ([place]) 06/26/2020   Falls 06/26/2020   Multiple falls    AKI (acute kidney injury) ([place]) 06/25/2020   PAD (peripheral artery disease) ([place]) 08/14/2019   Mixed hyperlipidemia 08/14/2019   Exertional dyspnea 05/13/2019   Coronary artery disease involving native coronary artery of native heart without angina pectoris 03/27/2019   Aortic atherosclerosis ([place]) 03/27/2019   Tobacco abuse 03/27/2019   Essential hypertension 03/27/2019   Asymptomatic carotid artery stenosis without infarction 03/27/2014    REFERRING DIAG: Hip pain   THERAPY DIAG:  Pain in right hip  Difficulty in walking, not elsewhere classified  Muscle weakness (generalized)  ONSET DATE: 11/29/20   SUBJECTIVE:    SUBJECTIVE STATEMENT: Pt denies pain today  , adding that she is only experiencing some Rt hip stiffness. She adds that she has continued to do her HEP.   PERTINENT HISTORY: Fall in 2022 that resulted in Rt acetabulum fracture, 2 months of rehab in SNF; see PMH above    PAIN:  Are you having pain? No VAS scale: 0/10 Pain location: N/A PAIN TYPE: N/A Pain description: n/a Aggravating factors: n/a Relieving factors: n/a   PRECAUTIONS: fall risk    WEIGHT BEARING RESTRICTIONS No   OBJECTIVE:   *Unless noted all objective measurements were obtained on initial evaluation*  DIAGNOSTIC FINDINGS: Rt Hip MRI 11/29/20: IMPRESSION: 1. Acute nondisplaced fracture of the right acetabulum involving the acetabular roof and anterior acetabulum. Small right hip joint effusion. 2. Partial tear of the iliofemoral ligament from its iliac attachment.   PATIENT SURVEYS:  FOTO 58% function to 72% predicted    COGNITION:          Overall cognitive status: Within functional limits for tasks assessed                        SENSATION:          Not assessed            MUSCLE LENGTH: Hamstrings: moderate tightness bilaterally Quadriceps: minimal tightness bilaterally     POSTURE:  Kyphotic, forward head rounded, shoulders   OBSERVATION: Petite build, decreased muscle mass    PALPATION: TTP  glute max along sacral attachment    LE AROM/PROM:   A/PROM Right 06/17/2021 Left 06/17/2021  Hip flexion WNL, pain along posterior hip at end range    Hip extension      Hip abduction      Hip adduction      Hip internal rotation WNL, pain free    Hip external rotation WNL, pain free     Knee flexion      Knee extension      Ankle dorsiflexion      Ankle plantarflexion      Ankle inversion      Ankle eversion       (Blank rows = not tested)   LE MMT:   MMT Right 06/17/2021 Left 06/17/2021 07/07/21  Hip flexion 4- 4- 4+/5 bilateral   Hip extension 3- 3+   Hip abduction 3+ 4-   Hip adduction       Hip internal rotation 4 4+   Hip  external rotation 4 4+   Knee flexion 5 5   Knee extension 5 5   Ankle dorsiflexion       Ankle plantarflexion       Ankle inversion       Ankle eversion        (Blank rows = not tested)   LOWER EXTREMITY SPECIAL TESTS:  (+) FABER RLE    FUNCTIONAL TESTS:  5 times sit to stand: 19 seconds; 07/07/21 17.5 seconds            TUG: 18 seconds SLS: <1 second bilaterally; hesitant to perform   GAIT:   Assistive device utilized: None Level of assistance: Modified independence Comments: slow velocity, decreased step length bilaterally, no arm swing, no trunk movement        TODAY'S TREATMENT:  [facility] Adult PT Treatment:                                                DATE: 07/14/2021 Therapeutic Exercise: 5# kettlebell swings with backs of legs at edge of table 3x10 Bridge with 10# plate on lap, [id number] with 3-sec hold Standing Cybex hip abduction with 17.5# 2x10 BIL Manual Therapy: N/A Neuromuscular re-ed: Standing marching on Airex 3x20 Tandem stepping on half foam roll, flat down 2x10 BIL Reactive backward/ front/side-to-side stepping against PT resistance until PT releases resistance x2 laps in // bars each direction Therapeutic Activity: N/A Modalities: N/A Self Care: N/A   [facility] Adult PT Treatment:                                                DATE: 07/07/21 Therapeutic Exercise: Sit to stand 10 lb kettle bell 2 x 10  Standing resisted hip extension and abduction 2 x 10 green band Sidelying hip circles CW/CCW 2 x 10 bilateral  LAQ 2 x 15; 3 lbs Step ups 2 x 10 bilateral; 4 inch step; single UE support Standing calf raise 2 x 15  Updated HEP  Neuromuscular re-ed: Romberg eyes closed 3 x 30 sec Semi-tandem 3 x 30 sec each Tandem attempted unable Lateral step ups on airex 2 x 10 BUE support     [facility] Adult PT Treatment:  DATE: 06/30/2021 Therapeutic Exercise: Supine bridge with adduction pillow squeeze 3x10 with 3-sec  hold Mini-squat side stepping in // bars 3x2 laps Dead lift with two 5# kettle bells, 3x8 Standing Cybex hip abduction with 12.5#, 2x10 BIL Standing Cybex hip extension with 12.5#, 2x10 BIL Cybex leg press with 20#, 3x10 Standing Pallof press with 3# cable 2x10 BIL Manual Therapy: Manual lateral hip distraction with belt x5 minutes with gentle oscillations on Rt Manual long-axis hip distraction x3 minutes with gentle oscillations on Rt Neuromuscular re-ed: N/A Therapeutic Activity: N/A Modalities: N/A Self Care: N/A      PATIENT EDUCATION:  Education details: see treatment  Person educated: Patient Education method: Explanation, demonstration, verbal cues, handout provided  Education comprehension: verbalized understanding, returned demonstration      HOME EXERCISE PROGRAM: Access Code: [email or web address]   ASSESSMENT:   CLINICAL IMPRESSION: Pt responded well to all interventions today, demonstrating good form and no pain with selected exercises. Of note, the pt reported apprehension with Airex pad marching today, but through PT cuing and encouragement, she was able to perform without UE support. Likewise, she demonstrates fair reactionary balance correction with newly added walking exercise against PT resistance. She will continue to benefit from skilled PT to address her primary impairments and return to her prior level of function with less limitation.  REHAB POTENTIAL: Fair     CLINICAL DECISION MAKING: Stable/uncomplicated   EVALUATION COMPLEXITY: Low     GOALS: Goals reviewed with patient? No   SHORT TERM GOALS:   STG Name Target Date Goal status  1 Patient will be independent and compliant with initial HEP.    Baseline:  07/01/2021 Achieved   2 Patient will complete 5 x STS in <15 seconds to signify improvements in functional strength.  Baseline:  07/15/2021 Ongoing   3 Patient will complete TUG in <15 seconds to signify improvements in gait speed and overall reduction  in fall risk.  Baseline: 07/15/2021 INITIAL    LONG TERM GOALS:    LTG Name Target Date Goal status  1 Patient will tolerate at least 10 minutes of walking with hip pain </= 2/10 to improve ability to complete household tasks.  Baseline: 08/12/2021 INITIAL  2 Patient will maintain SLS for at least 3 seconds bilaterally to improve overall gait stability.  Baseline: 08/12/2021 INITIAL  3 Patient will demonstrate at least 4/5 Rt hip strength to improve stability about the chain with walking activity.  Baseline: 08/12/2021 INITIAL    PLAN: PT FREQUENCY: 1x/week   PT DURATION: 8 weeks   PLANNED INTERVENTIONS: Therapeutic exercises, Therapeutic activity, Neuro Muscular re-education, Balance training, Gait training, Patient/Family education, Stair training, Cryotherapy, Moist heat, and Manual therapy   PLAN FOR NEXT SESSION: hip strengthening, static balance, gait training    Vanessa [place], PT, DPT 07/14/21 2:45 PM

## 2021-07-14 ENCOUNTER — Ambulatory Visit: Payer: Medicare Other | Attending: Family Medicine

## 2021-07-14 ENCOUNTER — Other Ambulatory Visit: Payer: Self-pay

## 2021-07-14 DIAGNOSIS — M6281 Muscle weakness (generalized): Secondary | ICD-10-CM | POA: Diagnosis not present

## 2021-07-14 DIAGNOSIS — R2689 Other abnormalities of gait and mobility: Secondary | ICD-10-CM | POA: Diagnosis not present

## 2021-07-14 DIAGNOSIS — M25551 Pain in right hip: Secondary | ICD-10-CM | POA: Diagnosis not present

## 2021-07-14 DIAGNOSIS — R262 Difficulty in walking, not elsewhere classified: Secondary | ICD-10-CM | POA: Diagnosis not present

## 2021-07-19 DIAGNOSIS — I10 Essential (primary) hypertension: Secondary | ICD-10-CM | POA: Diagnosis not present

## 2021-07-19 DIAGNOSIS — I2510 Atherosclerotic heart disease of native coronary artery without angina pectoris: Secondary | ICD-10-CM | POA: Diagnosis not present

## 2021-07-19 DIAGNOSIS — E46 Unspecified protein-calorie malnutrition: Secondary | ICD-10-CM | POA: Diagnosis not present

## 2021-07-19 DIAGNOSIS — F411 Generalized anxiety disorder: Secondary | ICD-10-CM | POA: Diagnosis not present

## 2021-07-20 NOTE — Therapy (Signed)
OUTPATIENT PHYSICAL THERAPY TREATMENT NOTE   Patient Name: Crystal Noble MRN: [id number] DOB:1945/06/30, 76 y.o., female Today's Date: 07/21/2021  PCP: Fanny Bien, MD REFERRING PROVIDER: Fanny Bien, MD   PT End of Session - 07/21/21 1316     Visit Number 6    Number of Visits 9    Date for PT Re-Evaluation 08/14/21    Authorization Type MCR    PT Start Time 1316    PT Stop Time 1357    PT Time Calculation (min) 41 min    Equipment Utilized During Treatment --    Activity Tolerance Patient tolerated treatment well    Behavior During Therapy Flat affect                Past Medical History:  Diagnosis Date   Carotid stenosis, asymptomatic    Colitis, collagenous    Hypertension    Stroke ([place]) 06/2019   Past Surgical History:  Procedure Laterality Date   APPENDECTOMY     Patient Active Problem List   Diagnosis Date Noted   Protein-calorie malnutrition, severe 12/01/2020   Mild cognitive impairment 11/30/2020   Right acetabular fracture ([place]) 11/29/2020   Hypokalemia 06/26/2020   Hypomagnesemia 06/26/2020   Alcohol abuse 06/26/2020   Anxiety disorder 06/26/2020   COPD (chronic obstructive pulmonary disease) ([place]) 06/26/2020   Falls 06/26/2020   Multiple falls    AKI (acute kidney injury) ([place]) 06/25/2020   PAD (peripheral artery disease) ([place]) 08/14/2019   Mixed hyperlipidemia 08/14/2019   Exertional dyspnea 05/13/2019   Coronary artery disease involving native coronary artery of native heart without angina pectoris 03/27/2019   Aortic atherosclerosis ([place]) 03/27/2019   Tobacco abuse 03/27/2019   Essential hypertension 03/27/2019   Asymptomatic carotid artery stenosis without infarction 03/27/2014    REFERRING DIAG: Hip pain   THERAPY DIAG:  Pain in right hip  Difficulty in walking, not elsewhere classified  Muscle weakness (generalized)  Other abnormalities of gait and mobility  ONSET DATE: 11/29/20   SUBJECTIVE:     SUBJECTIVE STATEMENT:  Patient reports the hip feels tight and stiff. She reports compliance with HEP.  PERTINENT HISTORY: Fall in 2022 that resulted in Rt acetabulum fracture, 2 months of rehab in SNF; see PMH above    PAIN:  Are you having pain? No VAS scale: 0/10 Pain location: N/A PAIN TYPE: N/A Pain description: n/a Aggravating factors: n/a Relieving factors: n/a   PRECAUTIONS: fall risk    WEIGHT BEARING RESTRICTIONS No   OBJECTIVE:   *Unless noted all objective measurements were obtained on initial evaluation*  DIAGNOSTIC FINDINGS: Rt Hip MRI 11/29/20: IMPRESSION: 1. Acute nondisplaced fracture of the right acetabulum involving the acetabular roof and anterior acetabulum. Small right hip joint effusion. 2. Partial tear of the iliofemoral ligament from its iliac attachment.   PATIENT SURVEYS:  FOTO 58% function to 72% predicted  07/21/21: 51% function    COGNITION:          Overall cognitive status: Within functional limits for tasks assessed                        SENSATION:          Not assessed            MUSCLE LENGTH: Hamstrings: moderate tightness bilaterally Quadriceps: minimal tightness bilaterally     POSTURE:  Kyphotic, forward head rounded, shoulders   OBSERVATION: Petite build, decreased muscle mass    PALPATION: TTP glute  max along sacral attachment    LE AROM/PROM:   A/PROM Right 06/17/2021 Left 06/17/2021  Hip flexion WNL, pain along posterior hip at end range    Hip extension      Hip abduction      Hip adduction      Hip internal rotation WNL, pain free    Hip external rotation WNL, pain free     Knee flexion      Knee extension      Ankle dorsiflexion      Ankle plantarflexion      Ankle inversion      Ankle eversion       (Blank rows = not tested)   LE MMT:   MMT Right 06/17/2021 Left 06/17/2021 07/07/21  Hip flexion 4- 4- 4+/5 bilateral   Hip extension 3- 3+   Hip abduction 3+ 4-   Hip adduction       Hip internal  rotation 4 4+   Hip external rotation 4 4+   Knee flexion 5 5   Knee extension 5 5   Ankle dorsiflexion       Ankle plantarflexion       Ankle inversion       Ankle eversion        (Blank rows = not tested)   LOWER EXTREMITY SPECIAL TESTS:  (+) FABER RLE    FUNCTIONAL TESTS:  5 times sit to stand: 19 seconds; 07/07/21 17.5 seconds            TUG: 18 seconds SLS: <1 second bilaterally; hesitant to perform   GAIT:   Assistive device utilized: None Level of assistance: Modified independence Comments: slow velocity, decreased step length bilaterally, no arm swing, no trunk movement        TODAY'S TREATMENT: [facility] Adult PT Treatment:                                                DATE: 07/21/21 Therapeutic Exercise: all standing exercises completed in // bars  Nustep level 4 x 5 minutes; LE only   Side stepping in // bars 2 sets d/b fingertip touch for support  Forward step overs towel 2 x 10; BUE support Lateral step overs towel 2 x 10; BUE support  High knees 2 sets d/b in // bar BUE support  Heel/toe raises 2 x 10  Backward walking in // bars 2 sets d/b in // bars; BUE support   Neuromuscular re-ed: Marching on airex 2 x 10; BUE support  Romberg on foam 1 x 10 sec, 3 x 30 sec  Semi-tandem 3 x 30 sec each    [facility] Adult PT Treatment:                                                DATE: 07/14/2021 Therapeutic Exercise: 5# kettlebell swings with backs of legs at edge of table 3x10 Bridge with 10# plate on lap, [id number] with 3-sec hold Standing Cybex hip abduction with 17.5# 2x10 BIL Manual Therapy: N/A Neuromuscular re-ed: Standing marching on Airex 3x20 Tandem stepping on half foam roll, flat down 2x10 BIL Reactive backward/ front/side-to-side stepping against PT resistance until PT releases resistance x2 laps in // bars each direction Therapeutic  Activity: N/A Modalities: N/A Self Care: N/A   [facility] Adult PT Treatment:                                                DATE:  07/07/21 Therapeutic Exercise: Sit to stand 10 lb kettle bell 2 x 10  Standing resisted hip extension and abduction 2 x 10 green band Sidelying hip circles CW/CCW 2 x 10 bilateral  LAQ 2 x 15; 3 lbs Step ups 2 x 10 bilateral; 4 inch step; single UE support Standing calf raise 2 x 15  Updated HEP  Neuromuscular re-ed: Romberg eyes closed 3 x 30 sec Semi-tandem 3 x 30 sec each Tandem attempted unable Lateral step ups on airex 2 x 10 BUE support     [facility] Adult PT Treatment:                                                DATE: 06/30/2021 Therapeutic Exercise: Supine bridge with adduction pillow squeeze 3x10 with 3-sec hold Mini-squat side stepping in // bars 3x2 laps Dead lift with two 5# kettle bells, 3x8 Standing Cybex hip abduction with 12.5#, 2x10 BIL Standing Cybex hip extension with 12.5#, 2x10 BIL Cybex leg press with 20#, 3x10 Standing Pallof press with 3# cable 2x10 BIL Manual Therapy: Manual lateral hip distraction with belt x5 minutes with gentle oscillations on Rt Manual long-axis hip distraction x3 minutes with gentle oscillations on Rt Neuromuscular re-ed: N/A Therapeutic Activity: N/A Modalities: N/A Self Care: N/A      PATIENT EDUCATION:  Education details: N/A Person educated: N/A Education method: N/A Education comprehension: N/A     HOME EXERCISE PROGRAM: Access Code: [email or web address]   ASSESSMENT:   CLINICAL IMPRESSION: Patient arrives without reports of hip pain. Continued to focus on progressing standing strengthening and balance activity. She remains very hesitant with static balance activity, though overall has good stability during narrow stance on stable and unstable surface with ability to correct postural sway utilizing ankle/hip strategy appropriately. With forward and lateral stepping she required intermittent cues to allow for appropriate knee and hip flexion as she has tendency to circumduct during swing. She required frequent seated rest breaks  between exercises, otherwise tolerated session well today without reports of pain.   REHAB POTENTIAL: Fair     CLINICAL DECISION MAKING: Stable/uncomplicated   EVALUATION COMPLEXITY: Low     GOALS: Goals reviewed with patient? No   SHORT TERM GOALS:   STG Name Target Date Goal status  1 Patient will be independent and compliant with initial HEP.    Baseline:  07/01/2021 Achieved   2 Patient will complete 5 x STS in <15 seconds to signify improvements in functional strength.  Baseline:  07/15/2021 Ongoing   3 Patient will complete TUG in <15 seconds to signify improvements in gait speed and overall reduction in fall risk.  Baseline: 07/15/2021 INITIAL    LONG TERM GOALS:    LTG Name Target Date Goal status  1 Patient will tolerate at least 10 minutes of walking with hip pain </= 2/10 to improve ability to complete household tasks.  Baseline: 08/12/2021 INITIAL  2 Patient will maintain SLS for at least 3 seconds bilaterally to improve overall gait stability.  Baseline: 08/12/2021 INITIAL  3 Patient will demonstrate at least 4/5 Rt hip strength to improve stability about the chain with walking activity.  Baseline: 08/12/2021 INITIAL    PLAN: PT FREQUENCY: 1x/week   PT DURATION: 8 weeks   PLANNED INTERVENTIONS: Therapeutic exercises, Therapeutic activity, Neuro Muscular re-education, Balance training, Gait training, Patient/Family education, Stair training, Cryotherapy, Moist heat, and Manual therapy   PLAN FOR NEXT SESSION: hip strengthening, static balance, gait training   Gwendolyn Grant, PT, DPT, ATC 07/21/21 2:33 PM

## 2021-07-21 ENCOUNTER — Other Ambulatory Visit: Payer: Self-pay

## 2021-07-21 ENCOUNTER — Ambulatory Visit: Payer: Medicare Other

## 2021-07-21 DIAGNOSIS — M6281 Muscle weakness (generalized): Secondary | ICD-10-CM | POA: Diagnosis not present

## 2021-07-21 DIAGNOSIS — R2689 Other abnormalities of gait and mobility: Secondary | ICD-10-CM | POA: Diagnosis not present

## 2021-07-21 DIAGNOSIS — R262 Difficulty in walking, not elsewhere classified: Secondary | ICD-10-CM | POA: Diagnosis not present

## 2021-07-21 DIAGNOSIS — M25551 Pain in right hip: Secondary | ICD-10-CM | POA: Diagnosis not present

## 2021-07-27 NOTE — Therapy (Signed)
OUTPATIENT PHYSICAL THERAPY TREATMENT NOTE   Patient Name: Crystal Noble MRN: [id number] DOB:[birth year], 76 y.o., female Today's Date: 07/28/2021  PCP: Fanny Bien, MD REFERRING PROVIDER: Fanny Bien, MD   PT End of Session - 07/28/21 1353     Visit Number 7    Number of Visits 9    Date for PT Re-Evaluation 08/14/21    Authorization Type MCR    PT Start Time 1355    PT Stop Time 1440    PT Time Calculation (min) 45 min    Equipment Utilized During Treatment Gait belt    Activity Tolerance Patient tolerated treatment well    Behavior During Therapy Flat affect                 Past Medical History:  Diagnosis Date   Carotid stenosis, asymptomatic    Colitis, collagenous    Hypertension    Stroke ([place]) 06/2019   Past Surgical History:  Procedure Laterality Date   APPENDECTOMY     Patient Active Problem List   Diagnosis Date Noted   Protein-calorie malnutrition, severe 12/01/2020   Mild cognitive impairment 11/30/2020   Right acetabular fracture ([place]) 11/29/2020   Hypokalemia 06/26/2020   Hypomagnesemia 06/26/2020   Alcohol abuse 06/26/2020   Anxiety disorder 06/26/2020   COPD (chronic obstructive pulmonary disease) ([place]) 06/26/2020   Falls 06/26/2020   Multiple falls    AKI (acute kidney injury) ([place]) 06/25/2020   PAD (peripheral artery disease) ([place]) 08/14/2019   Mixed hyperlipidemia 08/14/2019   Exertional dyspnea 05/13/2019   Coronary artery disease involving native coronary artery of native heart without angina pectoris 03/27/2019   Aortic atherosclerosis ([place]) 03/27/2019   Tobacco abuse 03/27/2019   Essential hypertension 03/27/2019   Asymptomatic carotid artery stenosis without infarction 03/27/2014    REFERRING DIAG: Hip pain   THERAPY DIAG:  Pain in right hip  Difficulty in walking, not elsewhere classified  Muscle weakness (generalized)  Other abnormalities of gait and mobility  ONSET DATE: 11/29/20   SUBJECTIVE:     SUBJECTIVE STATEMENT:  Pt denies any pain today, reporting that her primary complaint is balance. She also requests a lower level theraband to perform her standing hip strengthening.  PERTINENT HISTORY: Fall in 2022 that resulted in Rt acetabulum fracture, 2 months of rehab in SNF; see PMH above    PAIN:  Are you having pain? No VAS scale: 0/10 Pain location: N/A PAIN TYPE: N/A Pain description: n/a Aggravating factors: n/a Relieving factors: n/a   PRECAUTIONS: fall risk    WEIGHT BEARING RESTRICTIONS No   OBJECTIVE:   *Unless noted all objective measurements were obtained on initial evaluation*  DIAGNOSTIC FINDINGS: Rt Hip MRI 11/29/20: IMPRESSION: 1. Acute nondisplaced fracture of the right acetabulum involving the acetabular roof and anterior acetabulum. Small right hip joint effusion. 2. Partial tear of the iliofemoral ligament from its iliac attachment.   PATIENT SURVEYS:  FOTO 58% function to 72% predicted  07/21/21: 51% function    COGNITION:          Overall cognitive status: Within functional limits for tasks assessed                        SENSATION:          Not assessed            MUSCLE LENGTH: Hamstrings: moderate tightness bilaterally Quadriceps: minimal tightness bilaterally     POSTURE:  Kyphotic, forward head rounded, shoulders  OBSERVATION: Petite build, decreased muscle mass    PALPATION: TTP glute max along sacral attachment    LE AROM/PROM:   A/PROM Right 06/17/2021 Left 06/17/2021  Hip flexion WNL, pain along posterior hip at end range    Hip extension      Hip abduction      Hip adduction      Hip internal rotation WNL, pain free    Hip external rotation WNL, pain free     Knee flexion      Knee extension      Ankle dorsiflexion      Ankle plantarflexion      Ankle inversion      Ankle eversion       (Blank rows = not tested)   LE MMT:   MMT Right 06/17/2021 Left 06/17/2021 07/07/21  Hip flexion 4- 4- 4+/5 bilateral   Hip  extension 3- 3+   Hip abduction 3+ 4-   Hip adduction       Hip internal rotation 4 4+   Hip external rotation 4 4+   Knee flexion 5 5   Knee extension 5 5   Ankle dorsiflexion       Ankle plantarflexion       Ankle inversion       Ankle eversion        (Blank rows = not tested)   LOWER EXTREMITY SPECIAL TESTS:  (+) FABER RLE    FUNCTIONAL TESTS:  5 times sit to stand: 19 seconds; 07/07/21 17.5 seconds            TUG: 18 seconds SLS: <1 second bilaterally; hesitant to perform   GAIT:   Assistive device utilized: None Level of assistance: Modified independence Comments: slow velocity, decreased step length bilaterally, no arm swing, no trunk movement        TODAY'S TREATMENT:  [facility] Adult PT Treatment:                                                DATE: 07/28/2021 Therapeutic Exercise: Standing Cybex hip abduction with 17.5# 2x10 BIL Standing Cybex hip extension with 17.5# 2x10 BIL Standing Cybex hip flexion with 17.5# 2x10 BIL Sumo side steps with heel raises x3 laps in // bars Manual Therapy: N/A Neuromuscular re-ed: Backward walking against PT resistance with corrective balance strategy upon PT removing resistance without warning x3 laps in // bars Step up with knee drive on Airex pad 1K48 BIL Forward lunge on BOSU ball with foot on either side of ball 2x10 BIL on each side of ball Therapeutic Activity: N/A Modalities: N/A Self Care: N/A   [facility] Adult PT Treatment:                                                DATE: 07/21/21 Therapeutic Exercise: all standing exercises completed in // bars  Nustep level 4 x 5 minutes; LE only   Side stepping in // bars 2 sets d/b fingertip touch for support  Forward step overs towel 2 x 10; BUE support Lateral step overs towel 2 x 10; BUE support  High knees 2 sets d/b in // bar BUE support  Heel/toe raises 2 x 10  Backward walking in //  bars 2 sets d/b in // bars; BUE support   Neuromuscular re-ed: Marching on airex 2 x  10; BUE support  Romberg on foam 1 x 10 sec, 3 x 30 sec  Semi-tandem 3 x 30 sec each    [facility] Adult PT Treatment:                                                DATE: 07/14/2021 Therapeutic Exercise: 5# kettlebell swings with backs of legs at edge of table 3x10 Bridge with 10# plate on lap, 8U13 with 3-sec hold Standing Cybex hip abduction with 17.5# 2x10 BIL Manual Therapy: N/A Neuromuscular re-ed: Standing marching on Airex 3x20 Tandem stepping on half foam roll, flat down 2x10 BIL Reactive backward/ front/side-to-side stepping against PT resistance until PT releases resistance x2 laps in // bars each direction Therapeutic Activity: N/A Modalities: N/A Self Care: N/A        PATIENT EDUCATION:  Education details: N/A Person educated: N/A Education method: N/A Education comprehension: N/A     HOME EXERCISE PROGRAM: Access Code: [email or web address]   ASSESSMENT:   CLINICAL IMPRESSION: Pt responded well to all interventions today, demonstrating good form and no pain with selected exercises. She continues to show visual improvements with balance training and performed well today with reactive correction exercise. She will continue to benefit from skilled PT to address her primary impairments and return to her prior level of function with less limitation.  REHAB POTENTIAL: Fair     CLINICAL DECISION MAKING: Stable/uncomplicated   EVALUATION COMPLEXITY: Low     GOALS: Goals reviewed with patient? No   SHORT TERM GOALS:   STG Name Target Date Goal status  1 Patient will be independent and compliant with initial HEP.    Baseline:  07/01/2021 Achieved   2 Patient will complete 5 x STS in <15 seconds to signify improvements in functional strength.  Baseline:  07/15/2021 Ongoing   3 Patient will complete TUG in <15 seconds to signify improvements in gait speed and overall reduction in fall risk.  Baseline: 07/15/2021 INITIAL    LONG TERM GOALS:    LTG Name Target Date Goal status   1 Patient will tolerate at least 10 minutes of walking with hip pain </= 2/10 to improve ability to complete household tasks.  Baseline: 08/12/2021 INITIAL  2 Patient will maintain SLS for at least 3 seconds bilaterally to improve overall gait stability.  Baseline: 08/12/2021 INITIAL  3 Patient will demonstrate at least 4/5 Rt hip strength to improve stability about the chain with walking activity.  Baseline: 08/12/2021 INITIAL    PLAN: PT FREQUENCY: 1x/week   PT DURATION: 8 weeks   PLANNED INTERVENTIONS: Therapeutic exercises, Therapeutic activity, Neuro Muscular re-education, Balance training, Gait training, Patient/Family education, Stair training, Cryotherapy, Moist heat, and Manual therapy   PLAN FOR NEXT SESSION: hip strengthening, static balance, gait training   Vanessa Babb, PT, DPT 07/28/21 2:43 PM

## 2021-07-28 ENCOUNTER — Ambulatory Visit: Payer: Medicare Other

## 2021-07-28 ENCOUNTER — Other Ambulatory Visit: Payer: Self-pay

## 2021-07-28 DIAGNOSIS — M25551 Pain in right hip: Secondary | ICD-10-CM

## 2021-07-28 DIAGNOSIS — R2689 Other abnormalities of gait and mobility: Secondary | ICD-10-CM | POA: Diagnosis not present

## 2021-07-28 DIAGNOSIS — M6281 Muscle weakness (generalized): Secondary | ICD-10-CM

## 2021-07-28 DIAGNOSIS — R262 Difficulty in walking, not elsewhere classified: Secondary | ICD-10-CM

## 2021-08-03 NOTE — Therapy (Addendum)
OUTPATIENT PHYSICAL THERAPY TREATMENT NOTE/ PROGRESS NOTE  Progress Note Reporting Period 06/17/2021 to 08/04/2021  See note below for Objective Data and Assessment of Progress/Goals.      Patient Name: Crystal Noble MRN: [id number] DOB:1945-11-17, 76 y.o., female Today's Date: 08/18/2021  PCP: Fanny Bien, MD REFERRING PROVIDER: Fanny Bien, MD          Past Medical History:  Diagnosis Date   Carotid stenosis, asymptomatic    Colitis, collagenous    Hypertension    Stroke [facility]) 06/2019   Past Surgical History:  Procedure Laterality Date   APPENDECTOMY     Patient Active Problem List   Diagnosis Date Noted   Protein-calorie malnutrition, severe 12/01/2020   Mild cognitive impairment 11/30/2020   Right acetabular fracture ([place]) 11/29/2020   Hypokalemia 06/26/2020   Hypomagnesemia 06/26/2020   Alcohol abuse 06/26/2020   Anxiety disorder 06/26/2020   COPD (chronic obstructive pulmonary disease) ([place]) 06/26/2020   Falls 06/26/2020   Multiple falls    AKI (acute kidney injury) ([place]) 06/25/2020   PAD (peripheral artery disease) ([place]) 08/14/2019   Mixed hyperlipidemia 08/14/2019   Exertional dyspnea 05/13/2019   Coronary artery disease involving native coronary artery of native heart without angina pectoris 03/27/2019   Aortic atherosclerosis ([place]) 03/27/2019   Tobacco abuse 03/27/2019   Essential hypertension 03/27/2019   Asymptomatic carotid artery stenosis without infarction 03/27/2014    REFERRING DIAG: Hip pain   THERAPY DIAG:  Pain in right hip - Plan: PT plan of care cert/re-cert  Difficulty in walking, not elsewhere classified - Plan: PT plan of care cert/re-cert  Muscle weakness (generalized) - Plan: PT plan of care cert/re-cert  Other abnormalities of gait and mobility - Plan: PT plan of care cert/re-cert  ONSET DATE: 0/24/09   SUBJECTIVE:    SUBJECTIVE STATEMENT:  Pt reports no change since starting PT, adding that she  still has trouble walking; she is concerned with her short stride length. She reports doing her HEP about every other day. She reports that she would like to extend her POC and to focus on gait training moving forward.  PERTINENT HISTORY: Fall in 2022 that resulted in Rt acetabulum fracture, 2 months of rehab in SNF; see PMH above    PAIN:  Are you having pain? No VAS scale: 0/10 Pain location: N/A PAIN TYPE: N/A Pain description: n/a Aggravating factors: n/a Relieving factors: n/a   PRECAUTIONS: fall risk    WEIGHT BEARING RESTRICTIONS No   OBJECTIVE:   *Unless noted all objective measurements were obtained on initial evaluation*  DIAGNOSTIC FINDINGS: Rt Hip MRI 11/29/20: IMPRESSION: 1. Acute nondisplaced fracture of the right acetabulum involving the acetabular roof and anterior acetabulum. Small right hip joint effusion. 2. Partial tear of the iliofemoral ligament from its iliac attachment.   PATIENT SURVEYS:  FOTO 58% function to 72% predicted  07/21/21: 51% function    COGNITION:          Overall cognitive status: Within functional limits for tasks assessed                        SENSATION:          Not assessed            MUSCLE LENGTH: Hamstrings: moderate tightness bilaterally Quadriceps: minimal tightness bilaterally     POSTURE:  Kyphotic, forward head rounded, shoulders   OBSERVATION: Petite build, decreased muscle mass    PALPATION: TTP glute max along sacral  attachment    LE AROM/PROM:   A/PROM Right 06/17/2021 Left 06/17/2021  Hip flexion WNL, pain along posterior hip at end range    Hip extension      Hip abduction      Hip adduction      Hip internal rotation WNL, pain free    Hip external rotation WNL, pain free     Knee flexion      Knee extension      Ankle dorsiflexion      Ankle plantarflexion      Ankle inversion      Ankle eversion       (Blank rows = not tested)   LE MMT:   MMT Right 06/17/2021 Left 06/17/2021 07/07/21  Right 08/04/2021 Left 08/04/2021  Hip flexion 4- 4- 4+/5 bilateral  4+/5 4+/5  Hip extension 3- 3+  3+/5 3+/5  Hip abduction 3+ 4-  4+/5 4+/5  Hip adduction         Hip internal rotation 4 4+  5/5 5/5  Hip external rotation 4 4+  5/5 4+/5  Knee flexion 5 5     Knee extension 5 5     Ankle dorsiflexion         Ankle plantarflexion         Ankle inversion         Ankle eversion          (Blank rows = not tested)   LOWER EXTREMITY SPECIAL TESTS:  (+) FABER RLE    FUNCTIONAL TESTS:  5 times sit to stand: 19 seconds; 07/07/21 17.5 seconds ; 08/04/2021: 14.7 seconds           TUG: 18 seconds; 08/04/2021: 22 seconds SLS: <1 second bilaterally; hesitant to perform; 08/04/2021: Rt 5 seconds, Lt 2 seconds   GAIT:   Assistive device utilized: None Level of assistance: Modified independence Comments: slow velocity, decreased step length bilaterally, no arm swing, no trunk movement        TODAY'S TREATMENT:  [facility] Adult PT Treatment:                                                DATE: 08/04/2021 Therapeutic Exercise: N/A Manual Therapy: N/A Neuromuscular re-ed: Step up onto Airex pad with knee drive and slow LE retro step in // bars 2x10 BIL Standing balance clocks 2x5 BIL Therapeutic Activity: Gait training in // bars with stepping over yellow hurdles. Verbal cuing to hold hands behind head and to heel strike while achieving step-through gait pattern 2x6 laps Pt education on objective measures collected today, as well as updated POC and focus on gait training moving forward Side stepping over yellow hurdles in // bars 2x4 laps Modalities: N/A Self Care: N/A   [facility] Adult PT Treatment:                                                DATE: 07/28/2021 Therapeutic Exercise: Standing Cybex hip abduction with 17.5# 2x10 BIL Standing Cybex hip extension with 17.5# 2x10 BIL Standing Cybex hip flexion with 17.5# 2x10 BIL Sumo side steps with heel raises x3 laps in // bars Manual  Therapy: N/A Neuromuscular re-ed: Backward walking against PT resistance with corrective balance  strategy upon PT removing resistance without warning x3 laps in // bars Step up with knee drive on Airex pad 2X93 BIL Forward lunge on BOSU ball with foot on either side of ball 2x10 BIL on each side of ball Therapeutic Activity: N/A Modalities: N/A Self Care: N/A   [facility] Adult PT Treatment:                                                DATE: 07/21/21 Therapeutic Exercise: all standing exercises completed in // bars  Nustep level 4 x 5 minutes; LE only   Side stepping in // bars 2 sets d/b fingertip touch for support  Forward step overs towel 2 x 10; BUE support Lateral step overs towel 2 x 10; BUE support  High knees 2 sets d/b in // bar BUE support  Heel/toe raises 2 x 10  Backward walking in // bars 2 sets d/b in // bars; BUE support   Neuromuscular re-ed: Marching on airex 2 x 10; BUE support  Romberg on foam 1 x 10 sec, 3 x 30 sec  Semi-tandem 3 x 30 sec each         PATIENT EDUCATION:  Education details: N/A Person educated: N/A Education method: N/A Education comprehension: N/A     HOME EXERCISE PROGRAM: Access Code: [email or web address]   ASSESSMENT:   CLINICAL IMPRESSION: Pt responded well to interventions today, demonstrating good form with regular verbal cuing to limit utilization of UE support with balance and gait training exercises today. Upon re-assessment, the pt has made good progress on hip strengthening and 5xSTS, although her gait remains limited by short stride distance resembling a shuffling gait pattern. Due to pt request to continue to focus on gait training moving forward, as well as limitations in functional mobility and TUG testing, the pt will continue to benefit from skilled PT to address these impairments and return to her prior level of function with less limitation.  REHAB POTENTIAL: Fair     CLINICAL DECISION MAKING: Stable/uncomplicated   EVALUATION  COMPLEXITY: Low     GOALS: Goals reviewed with patient? No   SHORT TERM GOALS:   STG Name Target Date Goal status  1 Patient will be independent and compliant with initial HEP.    Baseline:  07/01/2021 Achieved   2 Patient will complete 5 x STS in <15 seconds to signify improvements in functional strength.  Baseline: 19 seconds 08/04/2021: 14.7 seconds 07/15/2021 Achieved  3 Patient will complete TUG in <15 seconds to signify improvements in gait speed and overall reduction in fall risk.  Baseline: 18 seconds 08/04/2021: 22 seconds 07/15/2021 Ongoing    LONG TERM GOALS:    LTG Name Target Date Goal status  1 Patient will tolerate at least 10 minutes of walking with hip pain </= 2/10 to improve ability to complete household tasks.  Baseline: 08/12/2021 INITIAL  2 Patient will maintain SLS for at least 3 seconds bilaterally to improve overall gait stability.  Baseline: 08/04/2021: 2 seconds on Lt, 5 seconds on Rt 08/12/2021 Ongoing  3 Patient will demonstrate at least 4/5 Rt hip strength to improve stability about the chain with walking activity.  Baseline: 08/04/2021: See updated MMT chart 08/12/2021 Ongoing    PLAN: PT FREQUENCY: 1x/week   PT DURATION: 8 weeks   PLANNED INTERVENTIONS: Therapeutic exercises, Therapeutic activity, Neuro Muscular re-education, Balance training,  Gait training, Patient/Family education, Stair training, Cryotherapy, Moist heat, and Manual therapy   PLAN FOR NEXT SESSION: hip strengthening, static balance, gait training with emphasis on stride length/ moving COM outside BOS   Vanessa [place], PT, DPT 08/18/21 2:06 PM  Addend: Vanessa [place], PT, DPT 08/18/21 2:06 PM

## 2021-08-04 ENCOUNTER — Other Ambulatory Visit: Payer: Self-pay

## 2021-08-04 ENCOUNTER — Ambulatory Visit: Payer: Medicare Other

## 2021-08-04 DIAGNOSIS — M25551 Pain in right hip: Secondary | ICD-10-CM

## 2021-08-04 DIAGNOSIS — M6281 Muscle weakness (generalized): Secondary | ICD-10-CM | POA: Diagnosis not present

## 2021-08-04 DIAGNOSIS — R262 Difficulty in walking, not elsewhere classified: Secondary | ICD-10-CM

## 2021-08-04 DIAGNOSIS — R2689 Other abnormalities of gait and mobility: Secondary | ICD-10-CM | POA: Diagnosis not present

## 2021-08-11 ENCOUNTER — Other Ambulatory Visit: Payer: Self-pay

## 2021-08-11 ENCOUNTER — Ambulatory Visit: Payer: Medicare Other | Attending: Family Medicine

## 2021-08-11 DIAGNOSIS — M25551 Pain in right hip: Secondary | ICD-10-CM | POA: Diagnosis not present

## 2021-08-11 DIAGNOSIS — R2689 Other abnormalities of gait and mobility: Secondary | ICD-10-CM | POA: Diagnosis not present

## 2021-08-11 DIAGNOSIS — M6281 Muscle weakness (generalized): Secondary | ICD-10-CM | POA: Diagnosis not present

## 2021-08-11 DIAGNOSIS — R262 Difficulty in walking, not elsewhere classified: Secondary | ICD-10-CM | POA: Diagnosis not present

## 2021-08-11 NOTE — Therapy (Addendum)
OUTPATIENT PHYSICAL THERAPY TREATMENT NOTE   Patient Name: HETTY LINHART MRN: [id number] DOB:1946-01-21, 76 y.o., female Today's Date: 08/11/2021  PCP: Fanny Bien, MD REFERRING PROVIDER: Fanny Bien, MD   PT End of Session - 08/11/21 1354     Visit Number 9    Number of Visits 13    Date for PT Re-Evaluation 09/08/21    Authorization Type MCR    Authorization Time Period FOTO v10, KX mod [id number]    Progress Note Due on Visit 10    PT Start Time 1352    PT Stop Time 4562    PT Time Calculation (min) 45 min    Equipment Utilized During Treatment Gait belt    Activity Tolerance Patient tolerated treatment well    Behavior During Therapy Flat affect                   Past Medical History:  Diagnosis Date   Carotid stenosis, asymptomatic    Colitis, collagenous    Hypertension    Stroke ([place]) 06/2019   Past Surgical History:  Procedure Laterality Date   APPENDECTOMY     Patient Active Problem List   Diagnosis Date Noted   Protein-calorie malnutrition, severe 12/01/2020   Mild cognitive impairment 11/30/2020   Right acetabular fracture ([place]) 11/29/2020   Hypokalemia 06/26/2020   Hypomagnesemia 06/26/2020   Alcohol abuse 06/26/2020   Anxiety disorder 06/26/2020   COPD (chronic obstructive pulmonary disease) ([place]) 06/26/2020   Falls 06/26/2020   Multiple falls    AKI (acute kidney injury) ([place]) 06/25/2020   PAD (peripheral artery disease) ([place]) 08/14/2019   Mixed hyperlipidemia 08/14/2019   Exertional dyspnea 05/13/2019   Coronary artery disease involving native coronary artery of native heart without angina pectoris 03/27/2019   Aortic atherosclerosis ([place]) 03/27/2019   Tobacco abuse 03/27/2019   Essential hypertension 03/27/2019   Asymptomatic carotid artery stenosis without infarction 03/27/2014    REFERRING DIAG: Hip pain   THERAPY DIAG:  Pain in right hip  Difficulty in walking, not elsewhere classified  Muscle weakness  (generalized)  Other abnormalities of gait and mobility  ONSET DATE: 11/29/20   SUBJECTIVE:    SUBJECTIVE STATEMENT:  I'm ok. I'm having pain in my hip.  PERTINENT HISTORY: Fall in 2022 that resulted in Rt acetabulum fracture, 2 months of rehab in SNF; see PMH above    PAIN:  Are you having pain? Yes VAS scale: 5/10 Pain location: R hip PAIN TYPE: N/A Pain description: n/a Aggravating factors: n/a Relieving factors: n/a   PRECAUTIONS: fall risk    WEIGHT BEARING RESTRICTIONS No   OBJECTIVE:   *Unless noted all objective measurements were obtained on initial evaluation*  DIAGNOSTIC FINDINGS: Rt Hip MRI 11/29/20: IMPRESSION: 1. Acute nondisplaced fracture of the right acetabulum involving the acetabular roof and anterior acetabulum. Small right hip joint effusion. 2. Partial tear of the iliofemoral ligament from its iliac attachment.   PATIENT SURVEYS:  FOTO 58% function to 72% predicted  07/21/21: 51% function    COGNITION:          Overall cognitive status: Within functional limits for tasks assessed                        SENSATION:          Not assessed            MUSCLE LENGTH: Hamstrings: moderate tightness bilaterally Quadriceps: minimal tightness bilaterally     POSTURE:  Kyphotic, forward head rounded, shoulders   OBSERVATION: Petite build, decreased muscle mass    PALPATION: TTP glute max along sacral attachment    LE AROM/PROM:   A/PROM Right 06/17/2021 Left 06/17/2021  Hip flexion WNL, pain along posterior hip at end range    Hip extension      Hip abduction      Hip adduction      Hip internal rotation WNL, pain free    Hip external rotation WNL, pain free     Knee flexion      Knee extension      Ankle dorsiflexion      Ankle plantarflexion      Ankle inversion      Ankle eversion       (Blank rows = not tested)   LE MMT:   MMT Right 06/17/2021 Left 06/17/2021 07/07/21 Right 08/04/2021 Left 08/04/2021  Hip flexion 4- 4- 4+/5  bilateral  4+/5 4+/5  Hip extension 3- 3+  3+/5 3+/5  Hip abduction 3+ 4-  4+/5 4+/5  Hip adduction         Hip internal rotation 4 4+  5/5 5/5  Hip external rotation 4 4+  5/5 4+/5  Knee flexion 5 5     Knee extension 5 5     Ankle dorsiflexion         Ankle plantarflexion         Ankle inversion         Ankle eversion          (Blank rows = not tested)   LOWER EXTREMITY SPECIAL TESTS:  (+) FABER RLE    FUNCTIONAL TESTS:  5 times sit to stand: 19 seconds; 07/07/21 17.5 seconds ; 08/04/2021: 14.7 seconds           TUG: 18 seconds; 08/04/2021: 22 seconds SLS: <1 second bilaterally; hesitant to perform; 08/04/2021: Rt 5 seconds, Lt 2 seconds   GAIT:   Assistive device utilized: None Level of assistance: Modified independence Comments: slow velocity, decreased step length bilaterally, no arm swing, no trunk movement        TODAY'S TREATMENT: [facility] Adult PT Treatment:                                                DATE: 08/11/2021 Therapeutic Exercise: Standing Cybex hip abduction with 17.5# 2x10 BIL Standing Cybex hip extension with 17.5# 2x10 BIL Standing Cybex hip flexion with 17.5# 2x10 BIL Neuromuscular re-ed: Step up onto Airex pad with knee drive and slow LE retro step in // bars 2x10 BIL Retro stepping with therapist pushing forward x 4 laps Therapeutic Activity: Gait training in // bars with stepping over yellow hurdles. Attempted verbal cuing to hold hands behind head and to heel strike while achieving step-through gait pattern x 3 laps (patient did not want to remove hands from bars) Side stepping over yellow hurdles in // bars 2x4 laps Gait x 60' cues for longer stride length   [facility] Adult PT Treatment:                                                DATE: 08/04/2021 Therapeutic Exercise: N/A Manual Therapy: N/A Neuromuscular re-ed: Step up onto  Airex pad with knee drive and slow LE retro step in // bars 2x10 BIL Standing balance clocks 2x5 BIL Therapeutic  Activity: Gait training in // bars with stepping over yellow hurdles. Verbal cuing to hold hands behind head and to heel strike while achieving step-through gait pattern 2x6 laps Pt education on objective measures collected today, as well as updated POC and focus on gait training moving forward Side stepping over yellow hurdles in // bars 2x4 laps Modalities: N/A Self Care: N/A   [facility] Adult PT Treatment:                                                DATE: 07/28/2021 Therapeutic Exercise: Standing Cybex hip abduction with 17.5# 2x10 BIL Standing Cybex hip extension with 17.5# 2x10 BIL Standing Cybex hip flexion with 17.5# 2x10 BIL Sumo side steps with heel raises x3 laps in // bars Manual Therapy: N/A Neuromuscular re-ed: Backward walking against PT resistance with corrective balance strategy upon PT removing resistance without warning x3 laps in // bars Step up with knee drive on Airex pad 1D40 BIL Forward lunge on BOSU ball with foot on either side of ball 2x10 BIL on each side of ball Therapeutic Activity: N/A Modalities: N/A Self Care: N/A     PATIENT EDUCATION:  Education details: N/A Person educated: N/A Education method: N/A Education comprehension: N/A     HOME EXERCISE PROGRAM: Access Code: [email or web address]   ASSESSMENT:   CLINICAL IMPRESSION: Patient presents to PT with moderate pain in the right hip and reports HEP compliance. She exhibits difficulty with stride length and takes small, shuffle steps when ambulating. She is capable of taking large steps when inside the parallel bars with constant cueing, but unable to translate outside of the bars when ambulating independently. Patient continues to benefit from skilled PT services and should be progressed as able to improve functional independence.   REHAB POTENTIAL: Fair     CLINICAL DECISION MAKING: Stable/uncomplicated   EVALUATION COMPLEXITY: Low     GOALS: Goals reviewed with patient? No   SHORT TERM  GOALS:   STG Name Target Date Goal status  1 Patient will be independent and compliant with initial HEP.    Baseline:  07/01/2021 Achieved   2 Patient will complete 5 x STS in <15 seconds to signify improvements in functional strength.  Baseline: 19 seconds 08/04/2021: 14.7 seconds 07/15/2021 Achieved  3 Patient will complete TUG in <15 seconds to signify improvements in gait speed and overall reduction in fall risk.  Baseline: 18 seconds 08/04/2021: 22 seconds 07/15/2021 Ongoing    LONG TERM GOALS:    LTG Name Target Date Goal status  1 Patient will tolerate at least 10 minutes of walking with hip pain </= 2/10 to improve ability to complete household tasks.  Baseline: 08/12/2021 INITIAL  2 Patient will maintain SLS for at least 3 seconds bilaterally to improve overall gait stability.  Baseline: 08/04/2021: 2 seconds on Lt, 5 seconds on Rt 08/12/2021 Ongoing  3 Patient will demonstrate at least 4/5 Rt hip strength to improve stability about the chain with walking activity.  Baseline: 08/04/2021: See updated MMT chart 08/12/2021 Ongoing    PLAN: PT FREQUENCY: 1x/week   PT DURATION: 8 weeks   PLANNED INTERVENTIONS: Therapeutic exercises, Therapeutic activity, Neuro Muscular re-education, Balance training, Gait training, Patient/Family education, Stair training, Cryotherapy, Moist heat,  and Manual therapy   PLAN FOR NEXT SESSION: hip strengthening, static balance, gait training with emphasis on stride length/ moving COM outside BOS    Evelene Croon, PTA 08/11/21 2:31 PM

## 2021-08-18 ENCOUNTER — Other Ambulatory Visit: Payer: Self-pay

## 2021-08-18 ENCOUNTER — Ambulatory Visit: Payer: Medicare Other

## 2021-08-18 DIAGNOSIS — M25551 Pain in right hip: Secondary | ICD-10-CM | POA: Diagnosis not present

## 2021-08-18 DIAGNOSIS — R262 Difficulty in walking, not elsewhere classified: Secondary | ICD-10-CM | POA: Diagnosis not present

## 2021-08-18 DIAGNOSIS — R2689 Other abnormalities of gait and mobility: Secondary | ICD-10-CM

## 2021-08-18 DIAGNOSIS — M6281 Muscle weakness (generalized): Secondary | ICD-10-CM | POA: Diagnosis not present

## 2021-08-18 NOTE — Therapy (Signed)
OUTPATIENT PHYSICAL THERAPY TREATMENT NOTE   Patient Name: Crystal Noble MRN: [id number] DOB:1946-04-26, 76 y.o., female Today's Date: 08/18/2021  PCP: Fanny Bien, MD REFERRING PROVIDER: Fanny Bien, MD   PT End of Session - 08/18/21 1404     Visit Number 10    Number of Visits 13    Date for PT Re-Evaluation 09/08/21    Authorization Type MCR    Authorization Time Period FOTO v10, KX mod v15    Progress Note Due on Visit 10    PT Start Time 5056    PT Stop Time 9794    PT Time Calculation (min) 42 min    Equipment Utilized During Treatment --    Activity Tolerance Patient tolerated treatment well    Behavior During Therapy Flat affect                    Past Medical History:  Diagnosis Date   Carotid stenosis, asymptomatic    Colitis, collagenous    Hypertension    Stroke ([place]) 06/2019   Past Surgical History:  Procedure Laterality Date   APPENDECTOMY     Patient Active Problem List   Diagnosis Date Noted   Protein-calorie malnutrition, severe 12/01/2020   Mild cognitive impairment 11/30/2020   Right acetabular fracture ([place]) 11/29/2020   Hypokalemia 06/26/2020   Hypomagnesemia 06/26/2020   Alcohol abuse 06/26/2020   Anxiety disorder 06/26/2020   COPD (chronic obstructive pulmonary disease) ([place]) 06/26/2020   Falls 06/26/2020   Multiple falls    AKI (acute kidney injury) ([place]) 06/25/2020   PAD (peripheral artery disease) ([place]) 08/14/2019   Mixed hyperlipidemia 08/14/2019   Exertional dyspnea 05/13/2019   Coronary artery disease involving native coronary artery of native heart without angina pectoris 03/27/2019   Aortic atherosclerosis ([place]) 03/27/2019   Tobacco abuse 03/27/2019   Essential hypertension 03/27/2019   Asymptomatic carotid artery stenosis without infarction 03/27/2014    REFERRING DIAG: Hip pain   THERAPY DIAG:  Pain in right hip  Difficulty in walking, not elsewhere classified  Muscle weakness  (generalized)  Other abnormalities of gait and mobility  ONSET DATE: 11/29/20   SUBJECTIVE:    SUBJECTIVE STATEMENT: I'm ok. I'm having pain in my hip.  PERTINENT HISTORY: Fall in 2022 that resulted in Rt acetabulum fracture, 2 months of rehab in SNF; see PMH above    PAIN:  Are you having pain? Yes VAS scale: 5/10 Pain location: R hip PAIN TYPE: N/A Pain description: n/a Aggravating factors: n/a Relieving factors: n/a   PRECAUTIONS: fall risk    WEIGHT BEARING RESTRICTIONS No   OBJECTIVE:   *Unless noted all objective measurements were obtained on initial evaluation*  DIAGNOSTIC FINDINGS: Rt Hip MRI 11/29/20: IMPRESSION: 1. Acute nondisplaced fracture of the right acetabulum involving the acetabular roof and anterior acetabulum. Small right hip joint effusion. 2. Partial tear of the iliofemoral ligament from its iliac attachment.   PATIENT SURVEYS:  FOTO 58% function to 72% predicted  07/21/21: 51% function  08/18/2021: 59 % function   COGNITION:          Overall cognitive status: Within functional limits for tasks assessed                        SENSATION:          Not assessed            MUSCLE LENGTH: Hamstrings: moderate tightness bilaterally Quadriceps: minimal tightness bilaterally  POSTURE:  Kyphotic, forward head rounded, shoulders   OBSERVATION: Petite build, decreased muscle mass    PALPATION: TTP glute max along sacral attachment    LE AROM/PROM:   A/PROM Right 06/17/2021 Left 06/17/2021  Hip flexion WNL, pain along posterior hip at end range    Hip extension      Hip abduction      Hip adduction      Hip internal rotation WNL, pain free    Hip external rotation WNL, pain free     Knee flexion      Knee extension      Ankle dorsiflexion      Ankle plantarflexion      Ankle inversion      Ankle eversion       (Blank rows = not tested)   LE MMT:   MMT Right 06/17/2021 Left 06/17/2021 07/07/21 Right 08/04/2021 Left 08/04/2021  Hip  flexion 4- 4- 4+/5 bilateral  4+/5 4+/5  Hip extension 3- 3+  3+/5 3+/5  Hip abduction 3+ 4-  4+/5 4+/5  Hip adduction         Hip internal rotation 4 4+  5/5 5/5  Hip external rotation 4 4+  5/5 4+/5  Knee flexion 5 5     Knee extension 5 5     Ankle dorsiflexion         Ankle plantarflexion         Ankle inversion         Ankle eversion          (Blank rows = not tested)   LOWER EXTREMITY SPECIAL TESTS:  (+) FABER RLE    FUNCTIONAL TESTS:  5 times sit to stand: 19 seconds; 07/07/21 17.5 seconds ; 08/04/2021: 14.7 seconds           TUG: 18 seconds; 08/04/2021: 22 seconds SLS: <1 second bilaterally; hesitant to perform; 08/04/2021: Rt 5 seconds, Lt 2 seconds   GAIT:   Assistive device utilized: None Level of assistance: Modified independence Comments: slow velocity, decreased step length bilaterally, no arm swing, no trunk movement        TODAY'S TREATMENT: [facility] Adult PT Treatment:                                                DATE: 08/18/2021 Therapeutic Exercise: Standing Cybex hip abduction with 12.5# 2x10 BIL (gets through more ROM with lower weight) Standing Cybex hip extension with 12.5# 2x10 BIL Neuromuscular re-ed: Step up onto Airex pad with knee drive and slow LE retro step in // bars 2x10 BIL Retro stepping with therapist pushing forward x 4 laps Romberg stance on airex 2x30" Therapeutic Activity: Side stepping over yellow hurdles in // bars 2x4 laps Forward stepping over yellow hurdles in // bars 2x4 laps Gait x 60' cues for longer stride length, picking knees up higher Step up and over 6" box 2x10 Forward step and opposite overhead arm reach x10 BIL   [facility] Adult PT Treatment:                                                DATE: 08/11/2021 Therapeutic Exercise: Standing Cybex hip abduction with 17.5# 2x10 BIL Standing Cybex hip extension  with 17.5# 2x10 BIL Standing Cybex hip flexion with 17.5# 2x10 BIL Neuromuscular re-ed: Step up onto Airex pad with knee  drive and slow LE retro step in // bars 2x10 BIL Retro stepping with therapist pushing forward x 4 laps Therapeutic Activity: Gait training in // bars with stepping over yellow hurdles. Attempted verbal cuing to hold hands behind head and to heel strike while achieving step-through gait pattern x 3 laps (patient did not want to remove hands from bars) Side stepping over yellow hurdles in // bars 2x4 laps Gait x 60' cues for longer stride length   [facility] Adult PT Treatment:                                                DATE: 08/04/2021 Therapeutic Exercise: N/A Manual Therapy: N/A Neuromuscular re-ed: Step up onto Airex pad with knee drive and slow LE retro step in // bars 2x10 BIL Standing balance clocks 2x5 BIL Therapeutic Activity: Gait training in // bars with stepping over yellow hurdles. Verbal cuing to hold hands behind head and to heel strike while achieving step-through gait pattern 2x6 laps Pt education on objective measures collected today, as well as updated POC and focus on gait training moving forward Side stepping over yellow hurdles in // bars 2x4 laps Modalities: N/A Self Care: N/A      PATIENT EDUCATION:  Education details: N/A Person educated: N/A Education method: N/A Education comprehension: N/A     HOME EXERCISE PROGRAM: Access Code: [email or web address]   ASSESSMENT:   CLINICAL IMPRESSION: Patient presents to PT with moderate levels of pain in the right hip. Today's session focused on increased bilateral lower extremity strength and improving stride length with gait training. She still demonstrates small, shuffle steps with ambulation and requires constant cues to lift knees and take longer steps. Patient continues to benefit from skilled PT services and should be progressed as able to improve functional independence.   REHAB POTENTIAL: Fair     CLINICAL DECISION MAKING: Stable/uncomplicated   EVALUATION COMPLEXITY: Low     GOALS: Goals reviewed with  patient? No   SHORT TERM GOALS:   STG Name Target Date Goal status  1 Patient will be independent and compliant with initial HEP.    Baseline:  07/01/2021 Achieved   2 Patient will complete 5 x STS in <15 seconds to signify improvements in functional strength.  Baseline: 19 seconds 08/04/2021: 14.7 seconds 07/15/2021 Achieved  3 Patient will complete TUG in <15 seconds to signify improvements in gait speed and overall reduction in fall risk.  Baseline: 18 seconds 08/04/2021: 22 seconds 07/15/2021 Ongoing    LONG TERM GOALS:    LTG Name Target Date Goal status  1 Patient will tolerate at least 10 minutes of walking with hip pain </= 2/10 to improve ability to complete household tasks.  Baseline: 09/08/2021 3/2 Ongoing  2 Patient will maintain SLS for at least 3 seconds bilaterally to improve overall gait stability.  Baseline: 08/04/2021: 2 seconds on Lt, 5 seconds on Rt 09/08/2021 Ongoing  3 Patient will demonstrate at least 4/5 Rt hip strength to improve stability about the chain with walking activity.  Baseline: 08/04/2021: See updated MMT chart 09/08/2021 Ongoing    PLAN: PT FREQUENCY: 1x/week   PT DURATION: 8 weeks   PLANNED INTERVENTIONS: Therapeutic exercises, Therapeutic activity, Neuro Muscular re-education, Balance training, Gait  training, Patient/Family education, Stair training, Cryotherapy, Moist heat, and Manual therapy   PLAN FOR NEXT SESSION: hip strengthening, static balance, gait training with emphasis on stride length/ moving COM outside BOS    [facility], PTA 08/18/21 2:50 PM

## 2021-08-24 NOTE — Therapy (Signed)
?OUTPATIENT PHYSICAL THERAPY TREATMENT NOTE ? ? ?Patient Name: Crystal Noble ?MRN: [id number] ?DOB:[birth year], 76 y.o., female ?Today's Date: 08/25/2021 ? ?PCP: Fanny Bien, MD ?REFERRING PROVIDER: Fanny Bien, MD ? ? PT End of Session - 08/25/21 1402   ? ? Visit Number 11   ? Number of Visits 13   ? Date for PT Re-Evaluation 09/08/21   ? Authorization Type MCR   ? Authorization Time Period FOTO v10, KX mod v15   ? Progress Note Due on Visit 10   ? PT Start Time 1400   ? PT Stop Time 2725   ? PT Time Calculation (min) 45 min   ? Equipment Utilized During Treatment Gait belt   ? Activity Tolerance Patient tolerated treatment well   ? Behavior During Therapy Flat affect   ? ?  ?  ? ?  ? ? ? ? ? ? ? ? ? ? ?Past Medical History:  ?Diagnosis Date  ? Carotid stenosis, asymptomatic   ? Colitis, collagenous   ? Hypertension   ? Stroke [facility]) 06/2019  ? ?Past Surgical History:  ?Procedure Laterality Date  ? APPENDECTOMY    ? ?Patient Active Problem List  ? Diagnosis Date Noted  ? Protein-calorie malnutrition, severe 12/01/2020  ? Mild cognitive impairment 11/30/2020  ? Right acetabular fracture ([place]) 11/29/2020  ? Hypokalemia 06/26/2020  ? Hypomagnesemia 06/26/2020  ? Alcohol abuse 06/26/2020  ? Anxiety disorder 06/26/2020  ? COPD (chronic obstructive pulmonary disease) ([place]) 06/26/2020  ? Falls 06/26/2020  ? Multiple falls   ? AKI (acute kidney injury) ([place]) 06/25/2020  ? PAD (peripheral artery disease) ([place]) 08/14/2019  ? Mixed hyperlipidemia 08/14/2019  ? Exertional dyspnea 05/13/2019  ? Coronary artery disease involving native coronary artery of native heart without angina pectoris 03/27/2019  ? Aortic atherosclerosis ([place]) 03/27/2019  ? Tobacco abuse 03/27/2019  ? Essential hypertension 03/27/2019  ? Asymptomatic carotid artery stenosis without infarction 03/27/2014  ? ? ?REFERRING DIAG: Hip pain  ? ?THERAPY DIAG:  ?Pain in right hip ? ?Difficulty in walking, not elsewhere classified ? ?Muscle weakness  (generalized) ? ?Other abnormalities of gait and mobility ? ?ONSET DATE: 11/29/20 ?  ?SUBJECTIVE:  ?  ?SUBJECTIVE STATEMENT: ?Pt reports no pain today , only stiffness in the Lt hip. She reports doing most of her home exercises, but not the new balance exercises. ? ?PERTINENT HISTORY: ?Fall in 2022 that resulted in Rt acetabulum fracture, 2 months of rehab in SNF; see PMH above  ?  ?PAIN:  ?Are you having pain? Yes ?VAS scale: 5/10 ?Pain location: R hip ?PAIN TYPE: N/A ?Pain description: n/a ?Aggravating factors: n/a ?Relieving factors: n/a ?  ?PRECAUTIONS: fall risk  ?  ?WEIGHT BEARING RESTRICTIONS No ? ? OBJECTIVE:  ? *Unless noted all objective measurements were obtained on initial evaluation* ? ?DIAGNOSTIC FINDINGS: Rt Hip MRI 11/29/20: IMPRESSION: ?1. Acute nondisplaced fracture of the right acetabulum involving the ?acetabular roof and anterior acetabulum. Small right hip joint ?effusion. ?2. Partial tear of the iliofemoral ligament from its iliac ?attachment. ?  ?PATIENT SURVEYS:  ?FOTO 58% function to 72% predicted  ?07/21/21: 51% function  ?08/18/2021: 59 % function ?  ?COGNITION: ?         Overall cognitive status: Within functional limits for tasks assessed              ?          ?SENSATION: ?         Not assessed  ?          ?  MUSCLE LENGTH: ?Hamstrings: moderate tightness bilaterally ?Quadriceps: minimal tightness bilaterally ?  ?  ?POSTURE:  ?Kyphotic, forward head rounded, shoulders ?  ?OBSERVATION: ?Petite build, decreased muscle mass  ?  ?PALPATION: ?TTP glute max along sacral attachment  ?  ?LE AROM/PROM: ?  ?A/PROM Right ?06/17/2021 Left ?06/17/2021  ?Hip flexion WNL, pain along posterior hip at end range    ?Hip extension      ?Hip abduction      ?Hip adduction      ?Hip internal rotation WNL, pain free    ?Hip external rotation WNL, pain free     ?Knee flexion      ?Knee extension      ?Ankle dorsiflexion      ?Ankle plantarflexion      ?Ankle inversion      ?Ankle eversion      ? (Blank rows = not  tested) ?  ?LE MMT: ?  ?MMT Right ?06/17/2021 Left ?06/17/2021 07/07/21 Right ?08/04/2021 Left ?08/04/2021  ?Hip flexion 4- 4- 4+/5 bilateral  4+/5 4+/5  ?Hip extension 3- 3+  3+/5 3+/5  ?Hip abduction 3+ 4-  4+/5 4+/5  ?Hip adduction         ?Hip internal rotation 4 4+  5/5 5/5  ?Hip external rotation 4 4+  5/5 4+/5  ?Knee flexion 5 5     ?Knee extension 5 5     ?Ankle dorsiflexion         ?Ankle plantarflexion         ?Ankle inversion         ?Ankle eversion         ? (Blank rows = not tested) ?  ?LOWER EXTREMITY SPECIAL TESTS:  ?(+) FABER RLE  ?  ?FUNCTIONAL TESTS:  ?5 times sit to stand: 19 seconds; 07/07/21 17.5 seconds ; 08/04/2021: 14.7 seconds ?          TUG: 18 seconds; 08/04/2021: 22 seconds ?SLS: <1 second bilaterally; hesitant to perform; 08/04/2021: Rt 5 seconds, Lt 2 seconds ?  ?GAIT: ?  ?Assistive device utilized: None ?Level of assistance: Modified independence ?Comments: slow velocity, decreased step length bilaterally, no arm swing, no trunk movement  ?  ?  ?  ?TODAY'S TREATMENT: ? ?[facility] Adult PT Treatment:                                                DATE: 08/25/2021 ?Therapeutic Exercise: ?Standing Cybex hip abduction with 12.5# 2x10 BIL ?Standing Cybex hip extension with 12.5# 2x10 BIL ?Standing Cybex hip flexion with 12.5# 2x10 BIL ?Heel raises with UE support 3x15 ?Standing slant board gastroc stretch x2 minutes ?Manual Therapy: ?N/A ?Neuromuscular re-ed: ?N/A ?Therapeutic Activity: ?Stepping in // bars to colored dots on the floor roughly 2 feet apart with cues for long and controled stride length 2x4 laps ?Side stepping in // bars with step over yellow hurdles 2x4 laps ?Dead lifts in front of chair with two 5# kettlebells 3x8 ?Modalities: ?N/A ?Self Care: ?N/A ? ? ?[facility] [place] Adult PT Treatment:                                                DATE: 08/18/2021 ?Therapeutic Exercise: ?Standing Cybex hip abduction with 12.5# 2x10  BIL (gets through more ROM with lower weight) ?Standing Cybex hip extension with  12.5# 2x10 BIL ?Neuromuscular re-ed: ?Step up onto Airex pad with knee drive and slow LE retro step in // bars 2x10 BIL ?Retro stepping with therapist pushing forward x 4 laps ?Romberg stance on airex 2x30" ?Therapeutic Activity: ?Side stepping over yellow hurdles in // bars 2x4 laps ?Forward stepping over yellow hurdles in // bars 2x4 laps ?Gait x 60' cues for longer stride length, picking knees up higher ?Step up and over 6" box 2x10 ?Forward step and opposite overhead arm reach x10 BIL ? ? ?[facility] Adult PT Treatment:                                                DATE: 08/11/2021 ?Therapeutic Exercise: ?Standing Cybex hip abduction with 17.5# 2x10 BIL ?Standing Cybex hip extension with 17.5# 2x10 BIL ?Standing Cybex hip flexion with 17.5# 2x10 BIL ?Neuromuscular re-ed: ?Step up onto Airex pad with knee drive and slow LE retro step in // bars 2x10 BIL ?Retro stepping with therapist pushing forward x 4 laps ?Therapeutic Activity: ?Gait training in // bars with stepping over yellow hurdles. Attempted verbal cuing to hold hands behind head and to heel strike while achieving step-through gait pattern x 3 laps (patient did not want to remove hands from bars) ?Side stepping over yellow hurdles in // bars 2x4 laps ?Gait x 60' cues for longer stride length ? ? ? ? ?  ?PATIENT EDUCATION:  ?Education details: N/A ?Person educated: N/A ?Education method: N/A ?Education comprehension: N/A ?  ?  ?HOME EXERCISE PROGRAM: ?Access Code: [email or web address] ?  ?ASSESSMENT: ?  ?CLINICAL IMPRESSION: ?Pt responded well to all interventions today, demonstrating good form and no increase in pain with selected exercises. Of note, the pt demonstrates improved stride length during and following targeted stride exercise today. She will continue to benefit from skilled PT to address her primary impairments and return to her prior level of function with less limitation. ? ? ?REHAB POTENTIAL: Fair   ?  ?CLINICAL DECISION MAKING: Stable/uncomplicated ?   ?EVALUATION COMPLEXITY: Low ?  ?  ?GOALS: ?Goals reviewed with patient? No ?  ?SHORT TERM GOALS: ?  ?STG Name Target Date Goal status  ?1 Patient will be independent and compliant with initial HEP.  ?  ?Baseline:  1

## 2021-08-25 ENCOUNTER — Other Ambulatory Visit: Payer: Self-pay

## 2021-08-25 ENCOUNTER — Ambulatory Visit: Payer: Medicare Other

## 2021-08-25 DIAGNOSIS — R262 Difficulty in walking, not elsewhere classified: Secondary | ICD-10-CM | POA: Diagnosis not present

## 2021-08-25 DIAGNOSIS — R2689 Other abnormalities of gait and mobility: Secondary | ICD-10-CM | POA: Diagnosis not present

## 2021-08-25 DIAGNOSIS — M25551 Pain in right hip: Secondary | ICD-10-CM | POA: Diagnosis not present

## 2021-08-25 DIAGNOSIS — M6281 Muscle weakness (generalized): Secondary | ICD-10-CM | POA: Diagnosis not present

## 2021-08-31 NOTE — Therapy (Signed)
?OUTPATIENT PHYSICAL THERAPY TREATMENT NOTE ? ? ?Patient Name: Crystal Noble ?MRN: [id number] ?DOB:[birth year], 76 y.o., female ?Today's Date: 09/01/2021 ? ?PCP: Fanny Bien, MD ?REFERRING PROVIDER: Fanny Bien, MD ? ? PT End of Session - 09/01/21 1358   ? ? Visit Number 12   ? Number of Visits 13   ? Date for PT Re-Evaluation 09/08/21   ? Authorization Type MCR   ? Authorization Time Period FOTO v10, KX mod v15   ? Progress Note Due on Visit 10   ? PT Start Time 1400   ? PT Stop Time 7829   ? PT Time Calculation (min) 45 min   ? Equipment Utilized During Treatment Gait belt   ? Activity Tolerance Patient tolerated treatment well   ? Behavior During Therapy Flat affect   ? ?  ?  ? ?  ? ? ? ? ? ? ? ? ? ? ? ?Past Medical History:  ?Diagnosis Date  ? Carotid stenosis, asymptomatic   ? Colitis, collagenous   ? Hypertension   ? Stroke [facility]) 06/2019  ? ?Past Surgical History:  ?Procedure Laterality Date  ? APPENDECTOMY    ? ?Patient Active Problem List  ? Diagnosis Date Noted  ? Protein-calorie malnutrition, severe 12/01/2020  ? Mild cognitive impairment 11/30/2020  ? Right acetabular fracture ([place]) 11/29/2020  ? Hypokalemia 06/26/2020  ? Hypomagnesemia 06/26/2020  ? Alcohol abuse 06/26/2020  ? Anxiety disorder 06/26/2020  ? COPD (chronic obstructive pulmonary disease) ([place]) 06/26/2020  ? Falls 06/26/2020  ? Multiple falls   ? AKI (acute kidney injury) ([place]) 06/25/2020  ? PAD (peripheral artery disease) ([place]) 08/14/2019  ? Mixed hyperlipidemia 08/14/2019  ? Exertional dyspnea 05/13/2019  ? Coronary artery disease involving native coronary artery of native heart without angina pectoris 03/27/2019  ? Aortic atherosclerosis ([place]) 03/27/2019  ? Tobacco abuse 03/27/2019  ? Essential hypertension 03/27/2019  ? Asymptomatic carotid artery stenosis without infarction 03/27/2014  ? ? ?REFERRING DIAG: Hip pain  ? ?THERAPY DIAG:  ?Pain in right hip ? ?Difficulty in walking, not elsewhere classified ? ?Muscle weakness  (generalized) ? ?Other abnormalities of gait and mobility ? ?ONSET DATE: 11/29/20 ?  ?SUBJECTIVE:  ?  ?SUBJECTIVE STATEMENT: ?Pt reports continued 4/10 Rt hip pain today. She reports that she has been cognizant of her stride length and has been practicing independently.  ? ?PERTINENT HISTORY: ?Fall in 2022 that resulted in Rt acetabulum fracture, 2 months of rehab in SNF; see PMH above  ?  ?PAIN:  ?Are you having pain? Yes ?VAS scale: 4/10 ?Pain location: R hip ?PAIN TYPE: N/A ?Pain description: n/a ?Aggravating factors: n/a ?Relieving factors: n/a ?  ?PRECAUTIONS: fall risk  ?  ?WEIGHT BEARING RESTRICTIONS No ? ? OBJECTIVE:  ? *Unless noted all objective measurements were obtained on initial evaluation* ? ?DIAGNOSTIC FINDINGS: Rt Hip MRI 11/29/20: IMPRESSION: ?1. Acute nondisplaced fracture of the right acetabulum involving the ?acetabular roof and anterior acetabulum. Small right hip joint ?effusion. ?2. Partial tear of the iliofemoral ligament from its iliac ?attachment. ?  ?PATIENT SURVEYS:  ?FOTO 58% function to 72% predicted  ?07/21/21: 51% function  ?08/18/2021: 59 % function ?  ?COGNITION: ?         Overall cognitive status: Within functional limits for tasks assessed              ?          ?SENSATION: ?         Not assessed  ?          ?  MUSCLE LENGTH: ?Hamstrings: moderate tightness bilaterally ?Quadriceps: minimal tightness bilaterally ?  ?  ?POSTURE:  ?Kyphotic, forward head rounded, shoulders ?  ?OBSERVATION: ?Petite build, decreased muscle mass  ?  ?PALPATION: ?TTP glute max along sacral attachment  ?  ?LE AROM/PROM: ?  ?A/PROM Right ?06/17/2021 Left ?06/17/2021  ?Hip flexion WNL, pain along posterior hip at end range    ?Hip extension      ?Hip abduction      ?Hip adduction      ?Hip internal rotation WNL, pain free    ?Hip external rotation WNL, pain free     ?Knee flexion      ?Knee extension      ?Ankle dorsiflexion      ?Ankle plantarflexion      ?Ankle inversion      ?Ankle eversion      ? (Blank rows =  not tested) ?  ?LE MMT: ?  ?MMT Right ?06/17/2021 Left ?06/17/2021 07/07/21 Right ?08/04/2021 Left ?08/04/2021  ?Hip flexion 4- 4- 4+/5 bilateral  4+/5 4+/5  ?Hip extension 3- 3+  3+/5 3+/5  ?Hip abduction 3+ 4-  4+/5 4+/5  ?Hip adduction         ?Hip internal rotation 4 4+  5/5 5/5  ?Hip external rotation 4 4+  5/5 4+/5  ?Knee flexion 5 5     ?Knee extension 5 5     ?Ankle dorsiflexion         ?Ankle plantarflexion         ?Ankle inversion         ?Ankle eversion         ? (Blank rows = not tested) ?  ?LOWER EXTREMITY SPECIAL TESTS:  ?(+) FABER RLE  ?  ?FUNCTIONAL TESTS:  ?5 times sit to stand: 19 seconds; 07/07/21 17.5 seconds ; 08/04/2021: 14.7 seconds ?          TUG: 18 seconds; 08/04/2021: 22 seconds ?SLS: <1 second bilaterally; hesitant to perform; 08/04/2021: Rt 5 seconds, Lt 2 seconds ?  ?GAIT: ?  ?Assistive device utilized: None ?Level of assistance: Modified independence ?Comments: slow velocity, decreased step length bilaterally, no arm swing, no trunk movement  ?  ?  ?  ?TODAY'S TREATMENT: ? ?[place] Adult PT Treatment:                                                DATE: 09/01/2021 ?Therapeutic Exercise: ?Side knee plank x3 to exhaustion BIL ?Bridge with GTB hip abduction 3x10 with 3-sec hold ?Walking lunges x4 laps in // bars ?Manual Therapy: ?N/A ?Neuromuscular re-ed: ?N/A ?Therapeutic Activity: ?Treadmill gait training with mirror bio-feedback with verbal and visual cues for increased stride length, upright posture, and longer stance time BIL x5 minutes at 0.5-25mh ?Sit-to-stand with 15# kettlebell 3x10 ?Modalities: ?N/A ?Self Care: ?N/A ? ? ?[facility]:                                                DATE: 08/25/2021 ?Therapeutic Exercise: ?Standing Cybex hip abduction with 12.5# 2x10 BIL ?Standing Cybex hip extension with 12.5# 2x10 BIL ?Standing Cybex hip flexion with 12.5# 2x10 BIL ?Heel raises with UE support 3x15 ?Standing slant board gastroc stretch x2 minutes ?  Manual Therapy: ?N/A ?Neuromuscular  re-ed: ?N/A ?Therapeutic Activity: ?Stepping in // bars to colored dots on the floor roughly 2 feet apart with cues for long and controled stride length 2x4 laps ?Side stepping in // bars with step over yellow hurdles 2x4 laps ?Dead lifts in front of chair with two 5# kettlebells 3x8 ?Modalities: ?N/A ?Self Care: ?N/A ? ? ?[place] Adult PT Treatment:                                                DATE: 08/18/2021 ?Therapeutic Exercise: ?Standing Cybex hip abduction with 12.5# 2x10 BIL (gets through more ROM with lower weight) ?Standing Cybex hip extension with 12.5# 2x10 BIL ?Neuromuscular re-ed: ?Step up onto Airex pad with knee drive and slow LE retro step in // bars 2x10 BIL ?Retro stepping with therapist pushing forward x 4 laps ?Romberg stance on airex 2x30" ?Therapeutic Activity: ?Side stepping over yellow hurdles in // bars 2x4 laps ?Forward stepping over yellow hurdles in // bars 2x4 laps ?Gait x 60' cues for longer stride length, picking knees up higher ?Step up and over 6" box 2x10 ?Forward step and opposite overhead arm reach x10 BIL ? ? ?  ?PATIENT EDUCATION:  ?Education details: Updated HEP, introduced walking program ?Person educated: Patient ?Education method: Verbalized, demonstrated, handout ?Education comprehension: Verbalized understanding, returned demonstration ?  ?  ?HOME EXERCISE PROGRAM: ?Access Code: [email or web address] ?URL: [email or web address] ?Date: 09/01/2021 ?Prepared by: Vanessa [place] ? ?Exercises ?Supine Hamstring Stretch with Strap - 1 x daily - 7 x weekly - 3 sets - 30 sec hold ?Supine Gluteus Stretch - 1 x daily - 7 x weekly - 3 sets - 30 sec hold ?Supine Figure 4 Piriformis Stretch - 1 x daily - 7 x weekly - 3 sets - 30 sec hold ?Hip Abduction with Resistance Loop - 1 x daily - 7 x weekly - 2 sets - 10 reps ?Hip Extension with Resistance Loop - 1 x daily - 7 x weekly - 2 sets - 10 reps ?Sidelying Hip Circles - 1 x daily - 7 x weekly - 2 sets - 10 reps ?Sit to Stand Without Arm  Support - 1 x daily - 7 x weekly - 2 sets - 10 reps ?Heel Raises with Counter Support - 1 x daily - 7 x weekly - 2 sets - 10 reps ?Standing Romberg to 1/2 Tandem Stance - 1 x daily - 7 x weekly - 3 se

## 2021-09-01 ENCOUNTER — Other Ambulatory Visit: Payer: Self-pay

## 2021-09-01 ENCOUNTER — Ambulatory Visit: Payer: Medicare Other

## 2021-09-01 DIAGNOSIS — R2689 Other abnormalities of gait and mobility: Secondary | ICD-10-CM | POA: Diagnosis not present

## 2021-09-01 DIAGNOSIS — R262 Difficulty in walking, not elsewhere classified: Secondary | ICD-10-CM | POA: Diagnosis not present

## 2021-09-01 DIAGNOSIS — M6281 Muscle weakness (generalized): Secondary | ICD-10-CM | POA: Diagnosis not present

## 2021-09-01 DIAGNOSIS — M25551 Pain in right hip: Secondary | ICD-10-CM

## 2021-09-07 DIAGNOSIS — E559 Vitamin D deficiency, unspecified: Secondary | ICD-10-CM | POA: Diagnosis not present

## 2021-09-07 DIAGNOSIS — I10 Essential (primary) hypertension: Secondary | ICD-10-CM | POA: Diagnosis not present

## 2021-09-08 ENCOUNTER — Ambulatory Visit: Payer: Medicare Other

## 2021-09-08 DIAGNOSIS — R2689 Other abnormalities of gait and mobility: Secondary | ICD-10-CM

## 2021-09-08 DIAGNOSIS — M6281 Muscle weakness (generalized): Secondary | ICD-10-CM | POA: Diagnosis not present

## 2021-09-08 DIAGNOSIS — M25551 Pain in right hip: Secondary | ICD-10-CM

## 2021-09-08 DIAGNOSIS — R262 Difficulty in walking, not elsewhere classified: Secondary | ICD-10-CM | POA: Diagnosis not present

## 2021-09-08 NOTE — Therapy (Signed)
?OUTPATIENT PHYSICAL THERAPY TREATMENT NOTE/ DISCHARGE SUMMARY ? ? ?Patient Name: Crystal Noble ?MRN: [id number] ?DOB:July 08, 1945, 76 y.o., female ?Today's Date: 09/08/2021 ? ?PCP: Fanny Bien, MD ?REFERRING PROVIDER: Fanny Bien, MD ? ? PT End of Session - 09/08/21 1359   ? ? Visit Number 13   ? Number of Visits 13   ? Date for PT Re-Evaluation 09/08/21   ? Authorization Type MCR   ? Authorization Time Period FOTO v10, KX mod v15   ? Progress Note Due on Visit 10   ? PT Start Time 1400   ? PT Stop Time 1440   ? PT Time Calculation (min) 40 min   ? Equipment Utilized During Treatment Gait belt   ? Activity Tolerance Patient tolerated treatment well   ? Behavior During Therapy Flat affect   ? ?  ?  ? ?  ? ? ? ? ? ? ? ? ? ? ? ? ?Past Medical History:  ?Diagnosis Date  ? Carotid stenosis, asymptomatic   ? Colitis, collagenous   ? Hypertension   ? Stroke [facility]) 06/2019  ? ?Past Surgical History:  ?Procedure Laterality Date  ? APPENDECTOMY    ? ?Patient Active Problem List  ? Diagnosis Date Noted  ? Protein-calorie malnutrition, severe 12/01/2020  ? Mild cognitive impairment 11/30/2020  ? Right acetabular fracture ([place]) 11/29/2020  ? Hypokalemia 06/26/2020  ? Hypomagnesemia 06/26/2020  ? Alcohol abuse 06/26/2020  ? Anxiety disorder 06/26/2020  ? COPD (chronic obstructive pulmonary disease) ([place]) 06/26/2020  ? Falls 06/26/2020  ? Multiple falls   ? AKI (acute kidney injury) ([place]) 06/25/2020  ? PAD (peripheral artery disease) ([place]) 08/14/2019  ? Mixed hyperlipidemia 08/14/2019  ? Exertional dyspnea 05/13/2019  ? Coronary artery disease involving native coronary artery of native heart without angina pectoris 03/27/2019  ? Aortic atherosclerosis ([place]) 03/27/2019  ? Tobacco abuse 03/27/2019  ? Essential hypertension 03/27/2019  ? Asymptomatic carotid artery stenosis without infarction 03/27/2014  ? ? ?REFERRING DIAG: Hip pain  ? ?THERAPY DIAG:  ?Pain in right hip ? ?Difficulty in walking, not elsewhere  classified ? ?Muscle weakness (generalized) ? ?Other abnormalities of gait and mobility ? ?ONSET DATE: 11/29/20 ?  ?SUBJECTIVE:  ?  ?SUBJECTIVE STATEMENT: ?Pt reports no pain today, adding that she has had no pain the past couple of days. She adds that she has been able to walk prolonged periods without limitation. She reports feeling ready to be discharged at this time. ? ?PERTINENT HISTORY: ?Fall in 2022 that resulted in Rt acetabulum fracture, 2 months of rehab in SNF; see PMH above  ?  ?PAIN:  ?Are you having pain? Yes ?VAS scale: 0/10 ?Pain location: R hip ?PAIN TYPE: N/A ?Pain description: n/a ?Aggravating factors: n/a ?Relieving factors: n/a ?  ?PRECAUTIONS: fall risk  ?  ?WEIGHT BEARING RESTRICTIONS No ? ? OBJECTIVE:  ? *Unless noted all objective measurements were obtained on initial evaluation* ? ?DIAGNOSTIC FINDINGS: Rt Hip MRI 11/29/20: IMPRESSION: ?1. Acute nondisplaced fracture of the right acetabulum involving the ?acetabular roof and anterior acetabulum. Small right hip joint ?effusion. ?2. Partial tear of the iliofemoral ligament from its iliac ?attachment. ?  ?PATIENT SURVEYS:  ?FOTO 58% function to 72% predicted  ?07/21/21: 51% function  ?08/18/2021: 59 % function ?09/08/2021: 62% ?  ?COGNITION: ?         Overall cognitive status: Within functional limits for tasks assessed              ?          ?  SENSATION: ?         Not assessed  ?          ?MUSCLE LENGTH: ?Hamstrings: moderate tightness bilaterally ?Quadriceps: minimal tightness bilaterally ?  ?  ?POSTURE:  ?Kyphotic, forward head rounded, shoulders ?  ?OBSERVATION: ?Petite build, decreased muscle mass  ?  ?PALPATION: ?TTP glute max along sacral attachment  ?  ?LE AROM/PROM: ?  ?A/PROM Right ?06/17/2021 Left ?06/17/2021  ?Hip flexion WNL, pain along posterior hip at end range    ?Hip extension      ?Hip abduction      ?Hip adduction      ?Hip internal rotation WNL, pain free    ?Hip external rotation WNL, pain free     ?Knee flexion      ?Knee extension       ?Ankle dorsiflexion      ?Ankle plantarflexion      ?Ankle inversion      ?Ankle eversion      ? (Blank rows = not tested) ?  ?LE MMT: ?  ?MMT Right ?06/17/2021 Left ?06/17/2021 07/07/21 Right ?08/04/2021 Left ?08/04/2021 Right ?09/08/2021 Left ?09/08/2021  ?Hip flexion 4- 4- 4+/5 bilateral  4+/5 4+/5 4+/5 4+/5  ?Hip extension 3- 3+  3+/5 3+/5 4/5 4/5  ?Hip abduction 3+ 4-  4+/5 4+/5 4+/5 4+/5  ?Hip adduction           ?Hip internal rotation 4 4+  5/5 5/5 5/5 5/5  ?Hip external rotation 4 4+  5/5 4+/5 5/5 5/5  ?Knee flexion 5 5       ?Knee extension 5 5       ?Ankle dorsiflexion           ?Ankle plantarflexion           ?Ankle inversion           ?Ankle eversion           ? (Blank rows = not tested) ?  ?LOWER EXTREMITY SPECIAL TESTS:  ?(+) FABER RLE  ?  ?FUNCTIONAL TESTS:  ?5 times sit to stand: 19 seconds; 07/07/21 17.5 seconds ; 08/04/2021: 14.7 seconds ?          TUG: 18 seconds; 08/04/2021: 22 seconds ?SLS: <1 second bilaterally; hesitant to perform; 08/04/2021: Rt 5 seconds, Lt 2 seconds ?  ?09/08/2021: ?5xSTS: 14.5 seconds ?TUG: 14.95 seconds ?SLS: 2 seconds BIL ? ?GAIT: ?  ?Assistive device utilized: None ?Level of assistance: Modified independence ?Comments: slow velocity, decreased step length bilaterally, no arm swing, no trunk movement  ?  ?  ?  ?TODAY'S TREATMENT: ? ?[place] Adult PT Treatment:                                                DATE: 09/08/2021 ?Therapeutic Exercise: ?Side knee planks with clams 2x10 BIL ?Balance clocks with 2-finger UE support 2x6 BIL ?Manual Therapy: ?N/A ?Neuromuscular re-ed: ?N/A ?Therapeutic Activity: ?Re-assessment of objective information with pt education on improvements to this point ?Updated HEP with demonstration ?FOTO re-administration ?Dead lifts in front of table with 15# kettlebell 3x8 ?Modalities: ?N/A ?Self Care: ?N/A ? ? ?[place] Adult PT Treatment:  DATE: 09/01/2021 ?Therapeutic Exercise: ?Side knee plank x3 to exhaustion  BIL ?Bridge with GTB hip abduction 3x10 with 3-sec hold ?Walking lunges x4 laps in // bars ?Manual Therapy: ?N/A ?Neuromuscular re-ed: ?N/A ?Therapeutic Activity: ?Treadmill gait training with mirror bio-feedback with verbal and visual cues for increased stride length, upright posture, and longer stance time BIL x5 minutes at 0.5-59mh ?Sit-to-stand with 15# kettlebell 3x10 ?Modalities: ?N/A ?Self Care: ?N/A ? ? ?[facility]:                                                DATE: 08/25/2021 ?Therapeutic Exercise: ?Standing Cybex hip abduction with 12.5# 2x10 BIL ?Standing Cybex hip extension with 12.5# 2x10 BIL ?Standing Cybex hip flexion with 12.5# 2x10 BIL ?Heel raises with UE support 3x15 ?Standing slant board gastroc stretch x2 minutes ?Manual Therapy: ?N/A ?Neuromuscular re-ed: ?N/A ?Therapeutic Activity: ?Stepping in // bars to colored dots on the floor roughly 2 feet apart with cues for long and controled stride length 2x4 laps ?Side stepping in // bars with step over yellow hurdles 2x4 laps ?Dead lifts in front of chair with two 5# kettlebells 3x8 ?Modalities: ?N/A ?Self Care: ?N/A ? ? ? ?  ?PATIENT EDUCATION:  ?Education details: Updated HEP, introduced walking program ?Person educated: Patient ?Education method: Verbalized, demonstrated, handout ?Education comprehension: Verbalized understanding, returned demonstration ?  ?  ?HOME EXERCISE PROGRAM: ?Access Code: [email or web address] ?URL: [email or web address] ?Date: 09/01/2021 ?Prepared by: TVanessa [place]? ?Exercises ?Supine Hamstring Stretch with Strap - 1 x daily - 7 x weekly - 3 sets - 30 sec hold ?Supine Gluteus Stretch - 1 x daily - 7 x weekly - 3 sets - 30 sec hold ?Supine Figure 4 Piriformis Stretch - 1 x daily - 7 x weekly - 3 sets - 30 sec hold ?Hip Abduction with Resistance Loop - 1 x daily - 7 x weekly - 2 sets - 10 reps ?Hip Extension with Resistance Loop - 1 x daily - 7 x weekly - 2 sets - 10 reps ?Sidelying Hip Circles - 1 x  daily - 7 x weekly - 2 sets - 10 reps ?Sit to Stand Without Arm Support - 1 x daily - 7 x weekly - 2 sets - 10 reps ?Heel Raises with Counter Support - 1 x daily - 7 x weekly - 2 sets - 10 reps ?Standing Romberg to

## 2021-10-14 DIAGNOSIS — Z20822 Contact with and (suspected) exposure to covid-19: Secondary | ICD-10-CM | POA: Diagnosis not present

## 2021-10-18 DIAGNOSIS — Z20822 Contact with and (suspected) exposure to covid-19: Secondary | ICD-10-CM | POA: Diagnosis not present

## 2022-01-17 DIAGNOSIS — E46 Unspecified protein-calorie malnutrition: Secondary | ICD-10-CM | POA: Diagnosis not present

## 2022-01-17 DIAGNOSIS — R64 Cachexia: Secondary | ICD-10-CM | POA: Diagnosis not present

## 2022-01-17 DIAGNOSIS — F331 Major depressive disorder, recurrent, moderate: Secondary | ICD-10-CM | POA: Diagnosis not present

## 2022-01-17 DIAGNOSIS — F411 Generalized anxiety disorder: Secondary | ICD-10-CM | POA: Diagnosis not present

## 2022-01-17 DIAGNOSIS — G4700 Insomnia, unspecified: Secondary | ICD-10-CM | POA: Diagnosis not present

## 2022-01-28 ENCOUNTER — Telehealth: Payer: Self-pay

## 2022-01-28 ENCOUNTER — Other Ambulatory Visit: Payer: Self-pay

## 2022-01-28 DIAGNOSIS — I10 Essential (primary) hypertension: Secondary | ICD-10-CM

## 2022-01-28 DIAGNOSIS — J449 Chronic obstructive pulmonary disease, unspecified: Secondary | ICD-10-CM

## 2022-01-28 NOTE — Telephone Encounter (Signed)
   Telephone encounter was:  Successful.  01/28/2022 Name: [name] MRN: [id number] DOB: Apr 12, 1946  Crystal Noble is a 76 y.o. year old female who is a primary care patient of Ernie Hew Mechele Claude, MD . The community resource team was consulted for assistance with [place] guide performed the following interventions: Patient provided with information about care guide support team and interviewed to confirm resource needs.Patient stated she has taken care of her food needs, no resources are needed at this time  Follow Up Plan:  No further follow up planned at this time. The patient has been provided with needed resources.    [place], Care Management  [phone] [address], [place], [place] [address] Phone: [phone] Email: [email or web address]

## 2022-02-17 DIAGNOSIS — F331 Major depressive disorder, recurrent, moderate: Secondary | ICD-10-CM | POA: Diagnosis not present

## 2022-02-17 DIAGNOSIS — E46 Unspecified protein-calorie malnutrition: Secondary | ICD-10-CM | POA: Diagnosis not present

## 2022-02-17 DIAGNOSIS — G4700 Insomnia, unspecified: Secondary | ICD-10-CM | POA: Diagnosis not present

## 2022-02-17 DIAGNOSIS — Z23 Encounter for immunization: Secondary | ICD-10-CM | POA: Diagnosis not present

## 2022-02-17 DIAGNOSIS — F411 Generalized anxiety disorder: Secondary | ICD-10-CM | POA: Diagnosis not present

## 2022-03-02 DIAGNOSIS — F411 Generalized anxiety disorder: Secondary | ICD-10-CM | POA: Diagnosis not present

## 2022-03-02 DIAGNOSIS — E46 Unspecified protein-calorie malnutrition: Secondary | ICD-10-CM | POA: Diagnosis not present

## 2022-03-02 DIAGNOSIS — G4700 Insomnia, unspecified: Secondary | ICD-10-CM | POA: Diagnosis not present

## 2022-03-02 DIAGNOSIS — F331 Major depressive disorder, recurrent, moderate: Secondary | ICD-10-CM | POA: Diagnosis not present

## 2022-03-23 DIAGNOSIS — I10 Essential (primary) hypertension: Secondary | ICD-10-CM | POA: Diagnosis not present

## 2022-03-23 DIAGNOSIS — G720 Drug-induced myopathy: Secondary | ICD-10-CM | POA: Diagnosis not present

## 2022-03-23 DIAGNOSIS — E785 Hyperlipidemia, unspecified: Secondary | ICD-10-CM | POA: Diagnosis not present

## 2022-03-23 DIAGNOSIS — R5383 Other fatigue: Secondary | ICD-10-CM | POA: Diagnosis not present

## 2022-03-23 DIAGNOSIS — Z789 Other specified health status: Secondary | ICD-10-CM | POA: Diagnosis not present

## 2022-03-23 DIAGNOSIS — E559 Vitamin D deficiency, unspecified: Secondary | ICD-10-CM | POA: Diagnosis not present

## 2022-03-23 DIAGNOSIS — R64 Cachexia: Secondary | ICD-10-CM | POA: Diagnosis not present

## 2022-03-23 DIAGNOSIS — E782 Mixed hyperlipidemia: Secondary | ICD-10-CM | POA: Diagnosis not present

## 2022-04-01 DIAGNOSIS — Z23 Encounter for immunization: Secondary | ICD-10-CM | POA: Diagnosis not present

## 2022-04-01 DIAGNOSIS — D519 Vitamin B12 deficiency anemia, unspecified: Secondary | ICD-10-CM | POA: Diagnosis not present

## 2022-04-25 ENCOUNTER — Other Ambulatory Visit: Payer: Self-pay | Admitting: Family Medicine

## 2022-04-25 DIAGNOSIS — R634 Abnormal weight loss: Secondary | ICD-10-CM | POA: Diagnosis not present

## 2022-04-25 DIAGNOSIS — R42 Dizziness and giddiness: Secondary | ICD-10-CM | POA: Diagnosis not present

## 2022-04-25 DIAGNOSIS — E559 Vitamin D deficiency, unspecified: Secondary | ICD-10-CM | POA: Diagnosis not present

## 2022-04-25 DIAGNOSIS — E2839 Other primary ovarian failure: Secondary | ICD-10-CM

## 2022-06-21 DIAGNOSIS — I10 Essential (primary) hypertension: Secondary | ICD-10-CM | POA: Diagnosis not present

## 2022-06-21 DIAGNOSIS — E559 Vitamin D deficiency, unspecified: Secondary | ICD-10-CM | POA: Diagnosis not present

## 2022-06-28 DIAGNOSIS — N189 Chronic kidney disease, unspecified: Secondary | ICD-10-CM | POA: Diagnosis not present

## 2022-06-28 DIAGNOSIS — E46 Unspecified protein-calorie malnutrition: Secondary | ICD-10-CM | POA: Diagnosis not present

## 2022-06-28 DIAGNOSIS — I10 Essential (primary) hypertension: Secondary | ICD-10-CM | POA: Diagnosis not present

## 2022-06-28 DIAGNOSIS — E559 Vitamin D deficiency, unspecified: Secondary | ICD-10-CM | POA: Diagnosis not present

## 2022-06-28 DIAGNOSIS — F411 Generalized anxiety disorder: Secondary | ICD-10-CM | POA: Diagnosis not present

## 2022-07-19 DIAGNOSIS — F331 Major depressive disorder, recurrent, moderate: Secondary | ICD-10-CM | POA: Diagnosis not present

## 2022-07-19 DIAGNOSIS — I10 Essential (primary) hypertension: Secondary | ICD-10-CM | POA: Diagnosis not present

## 2022-07-19 DIAGNOSIS — E46 Unspecified protein-calorie malnutrition: Secondary | ICD-10-CM | POA: Diagnosis not present

## 2022-07-19 DIAGNOSIS — F411 Generalized anxiety disorder: Secondary | ICD-10-CM | POA: Diagnosis not present

## 2022-07-19 DIAGNOSIS — F1020 Alcohol dependence, uncomplicated: Secondary | ICD-10-CM | POA: Diagnosis not present

## 2022-07-19 DIAGNOSIS — G4700 Insomnia, unspecified: Secondary | ICD-10-CM | POA: Diagnosis not present

## 2022-10-17 ENCOUNTER — Inpatient Hospital Stay (HOSPITAL_COMMUNITY)
Admission: EM | Admit: 2022-10-17 | Discharge: 2022-10-20 | DRG: 683 | Disposition: A | Discharge status: Home Health | Payer: Medicare HMO | Attending: Internal Medicine | Admitting: Internal Medicine

## 2022-10-17 ENCOUNTER — Inpatient Hospital Stay (HOSPITAL_COMMUNITY): Payer: Medicare HMO

## 2022-10-17 ENCOUNTER — Other Ambulatory Visit: Payer: Self-pay

## 2022-10-17 ENCOUNTER — Emergency Department (HOSPITAL_COMMUNITY): Payer: Medicare HMO

## 2022-10-17 ENCOUNTER — Encounter (HOSPITAL_COMMUNITY): Payer: Self-pay

## 2022-10-17 DIAGNOSIS — R000 Tachycardia, unspecified: Secondary | ICD-10-CM | POA: Diagnosis not present

## 2022-10-17 DIAGNOSIS — Z1152 Encounter for screening for COVID-19: Secondary | ICD-10-CM

## 2022-10-17 DIAGNOSIS — I10 Essential (primary) hypertension: Secondary | ICD-10-CM | POA: Diagnosis present

## 2022-10-17 DIAGNOSIS — Z7901 Long term (current) use of anticoagulants: Secondary | ICD-10-CM

## 2022-10-17 DIAGNOSIS — F1090 Alcohol use, unspecified, uncomplicated: Secondary | ICD-10-CM | POA: Diagnosis present

## 2022-10-17 DIAGNOSIS — M7989 Other specified soft tissue disorders: Secondary | ICD-10-CM | POA: Diagnosis not present

## 2022-10-17 DIAGNOSIS — Z818 Family history of other mental and behavioral disorders: Secondary | ICD-10-CM

## 2022-10-17 DIAGNOSIS — R634 Abnormal weight loss: Secondary | ICD-10-CM | POA: Diagnosis not present

## 2022-10-17 DIAGNOSIS — J439 Emphysema, unspecified: Secondary | ICD-10-CM | POA: Diagnosis present

## 2022-10-17 DIAGNOSIS — Z8673 Personal history of transient ischemic attack (TIA), and cerebral infarction without residual deficits: Secondary | ICD-10-CM

## 2022-10-17 DIAGNOSIS — N179 Acute kidney failure, unspecified: Principal | ICD-10-CM | POA: Diagnosis present

## 2022-10-17 DIAGNOSIS — Q6239 Other obstructive defects of renal pelvis and ureter: Secondary | ICD-10-CM

## 2022-10-17 DIAGNOSIS — F32A Depression, unspecified: Secondary | ICD-10-CM | POA: Diagnosis present

## 2022-10-17 DIAGNOSIS — Z880 Allergy status to penicillin: Secondary | ICD-10-CM | POA: Diagnosis not present

## 2022-10-17 DIAGNOSIS — N1330 Unspecified hydronephrosis: Secondary | ICD-10-CM | POA: Diagnosis not present

## 2022-10-17 DIAGNOSIS — Z79899 Other long term (current) drug therapy: Secondary | ICD-10-CM | POA: Diagnosis not present

## 2022-10-17 DIAGNOSIS — R0603 Acute respiratory distress: Principal | ICD-10-CM

## 2022-10-17 DIAGNOSIS — R64 Cachexia: Secondary | ICD-10-CM | POA: Diagnosis present

## 2022-10-17 DIAGNOSIS — K449 Diaphragmatic hernia without obstruction or gangrene: Secondary | ICD-10-CM | POA: Diagnosis not present

## 2022-10-17 DIAGNOSIS — N130 Hydronephrosis with ureteropelvic junction obstruction: Secondary | ICD-10-CM | POA: Diagnosis not present

## 2022-10-17 DIAGNOSIS — F419 Anxiety disorder, unspecified: Secondary | ICD-10-CM | POA: Diagnosis present

## 2022-10-17 DIAGNOSIS — F1020 Alcohol dependence, uncomplicated: Secondary | ICD-10-CM | POA: Diagnosis not present

## 2022-10-17 DIAGNOSIS — R0602 Shortness of breath: Secondary | ICD-10-CM | POA: Diagnosis not present

## 2022-10-17 DIAGNOSIS — Z7902 Long term (current) use of antithrombotics/antiplatelets: Secondary | ICD-10-CM | POA: Diagnosis not present

## 2022-10-17 DIAGNOSIS — Z1159 Encounter for screening for other viral diseases: Secondary | ICD-10-CM | POA: Diagnosis not present

## 2022-10-17 DIAGNOSIS — R42 Dizziness and giddiness: Secondary | ICD-10-CM | POA: Diagnosis not present

## 2022-10-17 DIAGNOSIS — Z681 Body mass index (BMI) 19 or less, adult: Secondary | ICD-10-CM

## 2022-10-17 DIAGNOSIS — R251 Tremor, unspecified: Secondary | ICD-10-CM | POA: Diagnosis present

## 2022-10-17 DIAGNOSIS — R7989 Other specified abnormal findings of blood chemistry: Secondary | ICD-10-CM | POA: Diagnosis not present

## 2022-10-17 DIAGNOSIS — Z83438 Family history of other disorder of lipoprotein metabolism and other lipidemia: Secondary | ICD-10-CM

## 2022-10-17 DIAGNOSIS — Z5948 Other specified lack of adequate food: Secondary | ICD-10-CM

## 2022-10-17 DIAGNOSIS — Z8249 Family history of ischemic heart disease and other diseases of the circulatory system: Secondary | ICD-10-CM

## 2022-10-17 DIAGNOSIS — Z7951 Long term (current) use of inhaled steroids: Secondary | ICD-10-CM

## 2022-10-17 DIAGNOSIS — F17210 Nicotine dependence, cigarettes, uncomplicated: Secondary | ICD-10-CM | POA: Diagnosis present

## 2022-10-17 DIAGNOSIS — Z66 Do not resuscitate: Secondary | ICD-10-CM | POA: Diagnosis not present

## 2022-10-17 DIAGNOSIS — J449 Chronic obstructive pulmonary disease, unspecified: Secondary | ICD-10-CM | POA: Diagnosis not present

## 2022-10-17 DIAGNOSIS — J42 Unspecified chronic bronchitis: Secondary | ICD-10-CM

## 2022-10-17 DIAGNOSIS — I2510 Atherosclerotic heart disease of native coronary artery without angina pectoris: Secondary | ICD-10-CM | POA: Diagnosis not present

## 2022-10-17 LAB — CBC WITH DIFFERENTIAL/PLATELET
Abs Immature Granulocytes: 0.02 10*3/uL (ref 0.00–0.07)
Basophils Absolute: 0 10*3/uL (ref 0.0–0.1)
Basophils Relative: 0 %
Eosinophils Absolute: 0 10*3/uL (ref 0.0–0.5)
Eosinophils Relative: 1 %
HCT: 36.4 % (ref 36.0–46.0)
Hemoglobin: 11.8 g/dL — ABNORMAL LOW (ref 12.0–15.0)
Immature Granulocytes: 0 %
Lymphocytes Relative: 17 %
Lymphs Abs: 1.1 10*3/uL (ref 0.7–4.0)
MCH: 31.6 pg (ref 26.0–34.0)
MCHC: 32.4 g/dL (ref 30.0–36.0)
MCV: 97.6 fL (ref 80.0–100.0)
Monocytes Absolute: 0.4 10*3/uL (ref 0.1–1.0)
Monocytes Relative: 6 %
Neutro Abs: 4.9 10*3/uL (ref 1.7–7.7)
Neutrophils Relative %: 76 %
Platelets: 252 10*3/uL (ref 150–400)
RBC: 3.73 MIL/uL — ABNORMAL LOW (ref 3.87–5.11)
RDW: 12.9 % (ref 11.5–15.5)
WBC: 6.4 10*3/uL (ref 4.0–10.5)
nRBC: 0 % (ref 0.0–0.2)

## 2022-10-17 LAB — COMPREHENSIVE METABOLIC PANEL
ALT: 29 U/L (ref 0–44)
AST: 27 U/L (ref 15–41)
Albumin: 4.4 g/dL (ref 3.5–5.0)
Alkaline Phosphatase: 68 U/L (ref 38–126)
Anion gap: 9 (ref 5–15)
BUN: 73 mg/dL — ABNORMAL HIGH (ref 8–23)
CO2: 21 mmol/L — ABNORMAL LOW (ref 22–32)
Calcium: 9.4 mg/dL (ref 8.9–10.3)
Chloride: 107 mmol/L (ref 98–111)
Creatinine, Ser: 1.99 mg/dL — ABNORMAL HIGH (ref 0.44–1.00)
GFR, Estimated: 26 mL/min — ABNORMAL LOW (ref >60)
Glucose, Bld: 101 mg/dL — ABNORMAL HIGH (ref 70–99)
Potassium: 4.7 mmol/L (ref 3.5–5.1)
Sodium: 137 mmol/L (ref 135–145)
Total Bilirubin: 0.6 mg/dL (ref 0.3–1.2)
Total Protein: 7.1 g/dL (ref 6.5–8.1)

## 2022-10-17 LAB — URINALYSIS, ROUTINE W REFLEX MICROSCOPIC
Bilirubin Urine: NEGATIVE
Glucose, UA: NEGATIVE mg/dL
Hgb urine dipstick: NEGATIVE
Ketones, ur: NEGATIVE mg/dL
Nitrite: NEGATIVE
Protein, ur: NEGATIVE mg/dL
Specific Gravity, Urine: 1.011 (ref 1.005–1.030)
pH: 5 (ref 5.0–8.0)

## 2022-10-17 LAB — TROPONIN I (HIGH SENSITIVITY)
Troponin I (High Sensitivity): 9 ng/L (ref <18)
Troponin I (High Sensitivity): 13 ng/L (ref <18)

## 2022-10-17 LAB — BRAIN NATRIURETIC PEPTIDE: B Natriuretic Peptide: 72.9 pg/mL (ref 0.0–100.0)

## 2022-10-17 LAB — SARS CORONAVIRUS 2 BY RT PCR: SARS Coronavirus 2 by RT PCR: NEGATIVE

## 2022-10-17 LAB — D-DIMER, QUANTITATIVE: D-Dimer, Quant: 1.12 ug/mL-FEU — ABNORMAL HIGH (ref 0.00–0.50)

## 2022-10-17 MED ORDER — ALBUTEROL SULFATE (2.5 MG/3ML) 0.083% IN NEBU
2.5000 mg | INHALATION_SOLUTION | Freq: Once | RESPIRATORY_TRACT | Status: AC
  Administered 2022-10-17: 2.5 mg via RESPIRATORY_TRACT
  Filled 2022-10-17: qty 3

## 2022-10-17 MED ORDER — ARFORMOTEROL TARTRATE 15 MCG/2ML IN NEBU
15.0000 ug | INHALATION_SOLUTION | Freq: Two times a day (BID) | RESPIRATORY_TRACT | Status: DC
  Administered 2022-10-17 – 2022-10-20 (×6): 15 ug via RESPIRATORY_TRACT
  Filled 2022-10-17 (×6): qty 2

## 2022-10-17 MED ORDER — ARIPIPRAZOLE 10 MG PO TABS
10.0000 mg | ORAL_TABLET | Freq: Every day | ORAL | Status: DC
  Administered 2022-10-18 – 2022-10-20 (×3): 10 mg via ORAL
  Filled 2022-10-17: qty 1
  Filled 2022-10-17 (×2): qty 2
  Filled 2022-10-17 (×3): qty 1
  Filled 2022-10-17: qty 2

## 2022-10-17 MED ORDER — ENOXAPARIN SODIUM 300 MG/3ML IJ SOLN
1.0000 mg/kg | Freq: Once | INTRAMUSCULAR | Status: DC
  Filled 2022-10-17: qty 0.3

## 2022-10-17 MED ORDER — POLYETHYLENE GLYCOL 3350 17 G PO PACK: 17.0000 g | PACK | Freq: Every day | ORAL | Status: DC | PRN

## 2022-10-17 MED ORDER — LORAZEPAM 2 MG/ML IJ SOLN: 1.0000 mg | INTRAMUSCULAR | Status: DC | PRN

## 2022-10-17 MED ORDER — AMLODIPINE BESYLATE 5 MG PO TABS
10.0000 mg | ORAL_TABLET | Freq: Every day | ORAL | Status: DC
  Administered 2022-10-18 – 2022-10-20 (×3): 10 mg via ORAL
  Filled 2022-10-17 (×3): qty 2

## 2022-10-17 MED ORDER — ENOXAPARIN SODIUM 300 MG/3ML IJ SOLN: 1.0000 mg/kg | Freq: Two times a day (BID) | INTRAMUSCULAR | Status: DC

## 2022-10-17 MED ORDER — ENOXAPARIN SODIUM 30 MG/0.3ML IJ SOSY
30.0000 mg | PREFILLED_SYRINGE | INTRAMUSCULAR | Status: DC
  Administered 2022-10-17 – 2022-10-19 (×3): 30 mg via SUBCUTANEOUS
  Filled 2022-10-17 (×3): qty 0.3

## 2022-10-17 MED ORDER — REVEFENACIN 175 MCG/3ML IN SOLN
175.0000 ug | Freq: Every day | RESPIRATORY_TRACT | Status: DC
  Administered 2022-10-18 – 2022-10-20 (×3): 175 ug via RESPIRATORY_TRACT
  Filled 2022-10-17 (×3): qty 3

## 2022-10-17 MED ORDER — SODIUM CHLORIDE 0.9 % IV SOLN: INTRAVENOUS | Status: DC

## 2022-10-17 MED ORDER — BUDESONIDE 0.25 MG/2ML IN SUSP
0.2500 mg | Freq: Two times a day (BID) | RESPIRATORY_TRACT | Status: DC
  Administered 2022-10-17 – 2022-10-20 (×6): 0.25 mg via RESPIRATORY_TRACT
  Filled 2022-10-17 (×5): qty 2

## 2022-10-17 MED ORDER — ALBUTEROL SULFATE (2.5 MG/3ML) 0.083% IN NEBU: 2.5000 mg | INHALATION_SOLUTION | RESPIRATORY_TRACT | Status: DC | PRN

## 2022-10-17 MED ORDER — ADULT MULTIVITAMIN W/MINERALS CH
1.0000 | ORAL_TABLET | Freq: Every day | ORAL | Status: DC
  Administered 2022-10-17 – 2022-10-20 (×4): 1 via ORAL
  Filled 2022-10-17 (×4): qty 1

## 2022-10-17 MED ORDER — ROSUVASTATIN CALCIUM 20 MG PO TABS: 10.0000 mg | ORAL_TABLET | Freq: Every day | ORAL | Status: DC

## 2022-10-17 MED ORDER — LORAZEPAM 1 MG PO TABS: 1.0000 mg | ORAL_TABLET | ORAL | Status: DC | PRN

## 2022-10-17 MED ORDER — SODIUM CHLORIDE 0.9 % IV BOLUS
500.0000 mL | Freq: Once | INTRAVENOUS | Status: AC
  Administered 2022-10-17: 500 mL via INTRAVENOUS

## 2022-10-17 MED ORDER — THIAMINE MONONITRATE 100 MG PO TABS
100.0000 mg | ORAL_TABLET | Freq: Every day | ORAL | Status: DC
  Administered 2022-10-17 – 2022-10-20 (×4): 100 mg via ORAL
  Filled 2022-10-17 (×4): qty 1

## 2022-10-17 MED ORDER — ESCITALOPRAM OXALATE 10 MG PO TABS
20.0000 mg | ORAL_TABLET | Freq: Every day | ORAL | Status: DC
  Administered 2022-10-18 – 2022-10-20 (×3): 20 mg via ORAL
  Filled 2022-10-17 (×3): qty 2

## 2022-10-17 MED ORDER — THIAMINE HCL 100 MG/ML IJ SOLN
100.0000 mg | Freq: Every day | INTRAMUSCULAR | Status: DC
  Filled 2022-10-17: qty 2

## 2022-10-17 MED ORDER — ACETAMINOPHEN 650 MG RE SUPP: 650.0000 mg | Freq: Four times a day (QID) | RECTAL | Status: DC | PRN

## 2022-10-17 MED ORDER — ENOXAPARIN SODIUM 30 MG/0.3ML IJ SOSY: 30.0000 mg | PREFILLED_SYRINGE | Freq: Once | INTRAMUSCULAR | Status: DC

## 2022-10-17 MED ORDER — FOLIC ACID 1 MG PO TABS
1.0000 mg | ORAL_TABLET | Freq: Every day | ORAL | Status: DC
  Administered 2022-10-17 – 2022-10-20 (×4): 1 mg via ORAL
  Filled 2022-10-17 (×4): qty 1

## 2022-10-17 MED ORDER — ACETAMINOPHEN 325 MG PO TABS: 650.0000 mg | ORAL_TABLET | Freq: Four times a day (QID) | ORAL | Status: DC | PRN

## 2022-10-17 MED ORDER — REVEFENACIN 175 MCG/3ML IN SOLN
175.0000 ug | Freq: Every day | RESPIRATORY_TRACT | Status: DC
  Filled 2022-10-17: qty 3

## 2022-10-17 NOTE — ED Provider Notes (Addendum)
[place] EMERGENCY DEPARTMENT AT [facility] Provider Note   CSN: [id number] Arrival date & time: 10/17/22  1320     History  Chief Complaint  Patient presents with   Shortness of Breath   Tremors    Crystal Noble is a 77 y.o. female.  HPI Patient presents at  the behest of her primary care physician due to weight loss, tremor, dyspnea.  She drinks alcohol daily, smokes drinks daily, does not use oxygen daily. Over the past 6 months she has had progressive numbness with all of the above concerns.  She notes that she lost 30 pounds, now weighing 70 pounds. She talked with her physician today and was sent here for evaluation.    Home Medications Prior to Admission medications   Medication Sig Start Date End Date Taking? Authorizing Provider  amLODipine (NORVASC) 10 MG tablet Take 1 tablet (10 mg total) by mouth daily. 07/01/20   Kathlen Mody, MD  ARIPiprazole (ABILIFY) 5 MG tablet Take 1 tablet (5 mg total) by mouth daily. 07/01/20   Kathlen Mody, MD  buPROPion (WELLBUTRIN XL) 150 MG 24 hr tablet Take 1 tablet by mouth every morning. Patient not taking: Reported on 01/19/2021 10/25/20   [provider]  clopidogrel (PLAVIX) 75 MG tablet Take 1 tablet (75 mg total) by mouth daily. 07/23/20   Micki Riley, MD  escitalopram (LEXAPRO) 20 MG tablet Take 20 mg by mouth daily. 03/17/20   [provider]  fluticasone-salmeterol (ADVAIR HFA) 230-21 MCG/ACT inhaler Inhale 2 puffs into the lungs 2 (two) times daily.    [provider]  folic acid (FOLVITE) 1 MG tablet Take 1 tablet (1 mg total) by mouth daily. Patient not taking: No sig reported 07/01/20   Kathlen Mody, MD  montelukast (SINGULAIR) 10 MG tablet Take 10 mg by mouth at bedtime.    [provider]  Multiple Vitamin (MULTIVITAMIN WITH MINERALS) TABS tablet Take 1 tablet by mouth daily. 07/01/20   Kathlen Mody, MD  rivaroxaban (XARELTO) 10 MG TABS tablet Take 1 tablet (10 mg  total) by mouth daily. Patient not taking: Reported on 01/19/2021 12/04/20   Merrilyn Puma, MD  rosuvastatin (CRESTOR) 10 MG tablet Take 10 mg by mouth at bedtime. 04/26/20   [provider]  senna-docusate (SENOKOT-S) 8.6-50 MG tablet Take 1 tablet by mouth at bedtime as needed for mild constipation. Patient not taking: No sig reported 06/30/20   Kathlen Mody, MD  thiamine 100 MG tablet Take 1 tablet (100 mg total) by mouth daily. Patient not taking: No sig reported 07/01/20   Kathlen Mody, MD      Allergies    Other and Penicillin g    Review of Systems   Review of Systems  All other systems reviewed and are negative.   Physical Exam Updated Vital Signs BP 125/69   Pulse 93   Temp 98 F (36.7 C) (Oral)   Resp 17   Ht 5' (1.524 m)   Wt 31.3 kg   SpO2 97%   BMI 13.48 kg/m  Physical Exam Vitals and nursing note reviewed.  Constitutional:      General: She is in acute distress.     Appearance: She is ill-appearing.  HENT:     Head: Normocephalic and atraumatic.  Eyes:     Conjunctiva/sclera: Conjunctivae normal.  Cardiovascular:     Rate and Rhythm: Regular rhythm. Tachycardia present.  Pulmonary:     Effort: Tachypnea, accessory muscle usage and respiratory distress  present.     Breath sounds: No stridor. Decreased breath sounds present.  Abdominal:     General: There is no distension.  Skin:    General: Skin is warm and dry.  Neurological:     Mental Status: She is alert and oriented to person, place, and time.     Cranial Nerves: No cranial nerve deficit.     Motor: Atrophy present.  Psychiatric:        Mood and Affect: Mood normal.     ED Results / Procedures / Treatments   Labs (all labs ordered are listed, but only abnormal results are displayed) Labs Reviewed  COMPREHENSIVE METABOLIC PANEL - Abnormal; Notable for the following components:      Result Value   CO2 21 (*)    Glucose, Bld 101 (*)    BUN 73 (*)    Creatinine, Ser 1.99 (*)     GFR, Estimated 26 (*)    All other components within normal limits  CBC WITH DIFFERENTIAL/PLATELET - Abnormal; Notable for the following components:   RBC 3.73 (*)    Hemoglobin 11.8 (*)    All other components within normal limits  D-DIMER, QUANTITATIVE - Abnormal; Notable for the following components:   D-Dimer, Quant 1.12 (*)    All other components within normal limits  SARS CORONAVIRUS 2 BY RT PCR  BRAIN NATRIURETIC PEPTIDE  TROPONIN I (HIGH SENSITIVITY)  TROPONIN I (HIGH SENSITIVITY)    EKG EKG Interpretation  Date/Time:  Monday Oct 17 2022 14:26:19 EDT Ventricular Rate:  81 PR Interval:  114 QRS Duration: 134 QT Interval:  414 QTC Calculation: 481 R Axis:   55 Text Interpretation: Sinus rhythm Borderline short PR interval Probable left atrial enlargement IVCD, consider atypical LBBB Artifact Abnormal ECG Confirmed by Gerhard Munch [phone]) on 10/17/2022 2:46:03 PM  Radiology DG Chest 2 View  Result Date: 10/17/2022 CLINICAL DATA:  Shortness of breath EXAM: CHEST - 2 VIEW COMPARISON:  X-ray 09/30/2013.  CT 03/01/2019 FINDINGS: Hyperinflation. No consolidation or pneumothorax. No edema. Normal cardiopericardial silhouette. Calcified aorta. Curvature and degenerative changes along the spine. On the lateral view there is some blunting of the inferior costophrenic angles. Tiny effusion versus pleural thickening. Mitral valve annular calcification. IMPRESSION: Hyperinflation with chronic changes. Tiny effusion versus pleural thickening. Electronically Signed   By: Karen Kays M.D.   On: 10/17/2022 15:33    Procedures Procedures    Medications Ordered in ED Medications  sodium chloride 0.9 % bolus 500 mL (has no administration in time range)  sodium chloride 0.9 % bolus 500 mL (has no administration in time range)  albuterol (PROVENTIL) (2.5 MG/3ML) 0.083% nebulizer solution 2.5 mg (2.5 mg Nebulization Given 10/17/22 1500)    ED Course/ Medical Decision Making/ A&P                              Medical Decision Making Adult female with history of smoking, daily alcohol use presents with worsening weakness fatigue and tremor in the context of ongoing weight loss over the past 6 months.  Differential including alcohol use disorder, pneumonia, COPD, malignancy, pulmonary was not considered.  Cardiac 105 sinus tach abnormal Pulse ox 97% with 2 L nasal cannula abnormal    Amount and/or Complexity of Data Reviewed External Data Reviewed: notes. Labs: ordered. Decision-making details documented in ED Course. Radiology: ordered and independent interpretation performed. Decision-making details documented in ED Course. ECG/medicine tests: ordered and independent interpretation  performed. Decision-making details documented in ED Course.  Risk Prescription drug management. Decision regarding hospitalization.   4:06 PM Patient with slightly less tachypnea, continues to feel better with supplemental oxygen, also. Initial labs notable for normal BNP value, negative COVID test, but D-dimer elevated at 1.12.  Patient also found to have evidence for acute kidney injury with creatinine of 2, BUN 70.  Patient is receiving fluids, notes that she feels better after albuterol, steroids.  Patient awaiting VQ scan, this test is currently unavailable.  Given the patient's worsening renal function she not a candidate for CT angiography.  This is far more consistent with COPD exacerbation, but this test may be done tomorrow after admission. I discussed this with the nuclear medicine tech. Given the patient's increased oxygen use, tachypnea, and acute renal dysfunction she will be admitted for further monitoring, management.  Final Clinical Impression(s) / ED Diagnoses Final diagnoses:  Respiratory distress  AKI (acute kidney injury) [facility])    Rx / DC Orders ED Discharge Orders     None         Gerhard Munch, MD 10/17/22 1608    Gerhard Munch, MD 10/17/22 1625

## 2022-10-17 NOTE — ED Provider Triage Note (Signed)
Emergency Medicine Provider Triage Evaluation Note  Crystal Noble , a 77 y.o. female  was evaluated in triage.  Pt complains of 6 months of shortness of breath and tremor "all over." States that she is short of breath all day. Nothing makes it better or worse. She denies having cough, chest pain, fever. Endorses palpitations. She smokes 10 cigarettes a day for 50 years.   Review of Systems  Positive: See above Negative:   Physical Exam  BP 125/69   Pulse 93   Temp 98 F (36.7 C) (Oral)   Resp 17   Ht 5' (1.524 m)   Wt 31.3 kg   SpO2 97%   BMI 13.48 kg/m  Gen:   Awake, no distress   Resp:  Tachypneic, decreased lung sounds  MSK:   Moves extremities without difficulty  Other:  Frail appearing   Medical Decision Making  Medically screening exam initiated at 2:12 PM.  Appropriate orders placed.  Crystal Noble was informed that the remainder of the evaluation will be completed by another provider, this initial triage assessment does not replace that evaluation, and the importance of remaining in the ED until their evaluation is complete.     Cristopher Peru, PA-C 10/17/22 1415

## 2022-10-17 NOTE — ED Notes (Signed)
ED TO INPATIENT HANDOFF REPORT  Name/Age/Gender Crystal Noble 77 y.o. female  Code Status    Code Status Orders  (From admission, onward)           Start     Ordered   10/17/22 1655  Do not attempt resuscitation (DNR)  Continuous       Question Answer Comment  If patient has no pulse and is not breathing Do Not Attempt Resuscitation   If patient has a pulse and/or is breathing: Medical Treatment Goals MEDICAL INTERVENTIONS DESIRED: Use advanced airway interventions, mechanical ventilation or cardioversion in appropriate circumstances; Use medication/IV fluids as indicated; Provide comfort medications; Transfer to Progressive/Stepdown/ICU as indicated.   Consent: Discussion documented in EHR or advanced directives reviewed      10/17/22 1657           Code Status History     Date Active Date Inactive Code Status Order ID Comments User Context   11/29/2020 2031 12/03/2020 2122 Full Code [id number]  Dellia Cloud, MD ED   06/26/2020 0115 06/30/2020 1845 Full Code [id number]  Briscoe Deutscher, MD ED   06/25/2020 2253 06/26/2020 0115 Full Code [id number]  Gerhard Munch, MD ED      Advance Directive Documentation    Flowsheet Row Most Recent Value  Type of Advance Directive Healthcare Power of Attorney  Pre-existing out of facility DNR order (yellow form or pink MOST form) --  "MOST" Form in Place? --       Home/SNF/Other Home  Chief Complaint AKI (acute kidney injury) (HCC) [N17.9]  Level of Care/Admitting Diagnosis ED Disposition     ED Disposition  Admit   Condition  --   Comment  Hospital Area: [facility] [[id number]]  Level of Care: Telemetry [5]  Admit to tele based on following criteria: Monitor QTC interval  May admit patient to [facility] or [facility] if equivalent level of care is available:: Yes  Covid Evaluation: Asymptomatic - no recent exposure (last 10 days) testing not required  Diagnosis: AKI (acute kidney injury) [facility])  [[id number]]  Admitting Physician: Maretta Bees [[id number]]  Attending Physician: Maretta Bees [[id number]]  Certification:: I certify this patient will need inpatient services for at least 2 midnights  Estimated Length of Stay: 2          Medical History Past Medical History:  Diagnosis Date   Carotid stenosis, asymptomatic    Colitis, collagenous    Hypertension    Stroke (HCC) 06/2019    Allergies Allergies  Allergen Reactions   Other Other (See Comments)    "Exacerbates my collagenous colitis"   Penicillin G Rash    IV Location/Drains/Wounds Patient Lines/Drains/Airways Status     Active Line/Drains/Airways     Name Placement date Placement time Site Days   Peripheral IV 10/17/22 20 G Right Antecubital 10/17/22  1415  Antecubital  less than 1            Labs/Imaging Results for orders placed or performed during the hospital encounter of 10/17/22 (from the past 48 hour(s))  Comprehensive metabolic panel     Status: Abnormal   Collection Time: 10/17/22  2:16 PM  Result Value Ref Range   Sodium 137 135 - 145 mmol/L   Potassium 4.7 3.5 - 5.1 mmol/L   Chloride 107 98 - 111 mmol/L   CO2 21 (L) 22 - 32 mmol/L   Glucose, Bld 101 (H) 70 - 99 mg/dL    Comment: Glucose reference range  applies only to samples taken after fasting for at least 8 hours.   BUN 73 (H) 8 - 23 mg/dL   Creatinine, Ser 9.60 (H) 0.44 - 1.00 mg/dL   Calcium 9.4 8.9 - 45.4 mg/dL   Total Protein 7.1 6.5 - 8.1 g/dL   Albumin 4.4 3.5 - 5.0 g/dL   AST 27 15 - 41 U/L   ALT 29 0 - 44 U/L   Alkaline Phosphatase 68 38 - 126 U/L   Total Bilirubin 0.6 0.3 - 1.2 mg/dL   GFR, Estimated 26 (L) >60 mL/min    Comment: (NOTE) Calculated using the CKD-EPI Creatinine Equation (2021)    Anion gap 9 5 - 15    Comment: Performed at [facility], [address] [place], [place] [address]  Troponin I (High Sensitivity)     Status: None   Collection Time: 10/17/22  2:16 PM  Result Value Ref  Range   Troponin I (High Sensitivity) 9 <18 ng/L    Comment: (NOTE) Elevated high sensitivity troponin I (hsTnI) values and significant  changes across serial measurements may suggest ACS but many other  chronic and acute conditions are known to elevate hsTnI results.  Refer to the "Links" section for chest pain algorithms and additional  guidance. Performed at [facility], [address], [place], [place] [address]   CBC with Differential     Status: Abnormal   Collection Time: 10/17/22  2:16 PM  Result Value Ref Range   WBC 6.4 4.0 - 10.5 K/uL   RBC 3.73 (L) 3.87 - 5.11 MIL/uL   Hemoglobin 11.8 (L) 12.0 - 15.0 g/dL   HCT 29.5 62.1 - 30.8 %   MCV 97.6 80.0 - 100.0 fL   MCH 31.6 26.0 - 34.0 pg   MCHC 32.4 30.0 - 36.0 g/dL   RDW 65.7 84.6 - 96.2 %   Platelets 252 150 - 400 K/uL   nRBC 0.0 0.0 - 0.2 %   Neutrophils Relative % 76 %   Neutro Abs 4.9 1.7 - 7.7 K/uL   Lymphocytes Relative 17 %   Lymphs Abs 1.1 0.7 - 4.0 K/uL   Monocytes Relative 6 %   Monocytes Absolute 0.4 0.1 - 1.0 K/uL   Eosinophils Relative 1 %   Eosinophils Absolute 0.0 0.0 - 0.5 K/uL   Basophils Relative 0 %   Basophils Absolute 0.0 0.0 - 0.1 K/uL   Immature Granulocytes 0 %   Abs Immature Granulocytes 0.02 0.00 - 0.07 K/uL    Comment: Performed at [facility], [address], [place], [place] [address]  SARS Coronavirus 2 by RT PCR (hospital order, performed in [facility] hospital lab) *cepheid single result test* Anterior Nasal Swab     Status: None   Collection Time: 10/17/22  2:27 PM   Specimen: Anterior Nasal Swab  Result Value Ref Range   SARS Coronavirus 2 by RT PCR NEGATIVE NEGATIVE    Comment: (NOTE) SARS-CoV-2 target nucleic acids are NOT DETECTED.  The SARS-CoV-2 RNA is generally detectable in upper and lower respiratory specimens during the acute phase of infection. The lowest concentration of SARS-CoV-2 viral copies this assay can detect is  250 copies / mL. A negative result does not preclude SARS-CoV-2 infection and should not be used as the sole basis for treatment or other patient management decisions.  A negative result may occur with improper specimen collection / handling, submission of specimen other than nasopharyngeal swab, presence of viral mutation(s)  within the areas targeted by this assay, and inadequate number of viral copies (<250 copies / mL). A negative result must be combined with clinical observations, patient history, and epidemiological information.  Fact Sheet for Patients:   RoadLapTop.co.za  Fact Sheet for Healthcare Providers: http://kim-miller.com/  This test is not yet approved or  cleared by the [place] FDA and has been authorized for detection and/or diagnosis of SARS-CoV-2 by FDA under an Emergency Use Authorization (EUA).  This EUA will remain in effect (meaning this test can be used) for the duration of the COVID-19 declaration under Section 564(b)(1) of the Act, 21 U.S.C. section 360bbb-3(b)(1), unless the authorization is terminated or revoked sooner.  Performed at [facility], [address] [place], [place] [address]   Brain natriuretic peptide     Status: None   Collection Time: 10/17/22  2:49 PM  Result Value Ref Range   B Natriuretic Peptide 72.9 0.0 - 100.0 pg/mL    Comment: Performed at [facility], [address], [place], [place] [address]  D-dimer, quantitative     Status: Abnormal   Collection Time: 10/17/22  2:49 PM  Result Value Ref Range   D-Dimer, Quant 1.12 (H) 0.00 - 0.50 ug/mL-FEU    Comment: (NOTE) At the manufacturer cut-off value of 0.5 g/mL FEU, this assay has a negative predictive value of 95-100%.This assay is intended for use in conjunction with a clinical pretest probability (PTP) assessment model to exclude pulmonary embolism (PE) and deep venous  thrombosis (DVT) in outpatients suspected of PE or DVT. Results should be correlated with clinical presentation. Performed at [facility], [address] [place], [place] [address]    DG Chest 2 View  Result Date: 10/17/2022 CLINICAL DATA:  Shortness of breath EXAM: CHEST - 2 VIEW COMPARISON:  X-ray 09/30/2013.  CT 03/01/2019 FINDINGS: Hyperinflation. No consolidation or pneumothorax. No edema. Normal cardiopericardial silhouette. Calcified aorta. Curvature and degenerative changes along the spine. On the lateral view there is some blunting of the inferior costophrenic angles. Tiny effusion versus pleural thickening. Mitral valve annular calcification. IMPRESSION: Hyperinflation with chronic changes. Tiny effusion versus pleural thickening. Electronically Signed   By: Karen Kays M.D.   On: 10/17/2022 15:33    Pending Labs Unresulted Labs (From admission, onward)     Start     Ordered   10/18/22 0500  TSH  Tomorrow morning,   R        10/17/22 1657   10/18/22 0500  Comprehensive metabolic panel  Tomorrow morning,   R        10/17/22 1657   10/18/22 0500  CBC  Tomorrow morning,   R        10/17/22 1657   10/18/22 0500  HIV Antibody (routine testing w rflx)  (HIV Antibody (Routine testing w reflex) panel)  Tomorrow morning,   R        10/17/22 1717   10/17/22 1717  Urinalysis, Routine w reflex microscopic -Urine, Clean Catch  Once,   R       Question:  Specimen Source  Answer:  Urine, Clean Catch   10/17/22 1717            Vitals/Pain Today's Vitals   10/17/22 1327 10/17/22 1350 10/17/22 1353  BP: 125/69    Pulse: 93    Resp: 17    Temp: 98 F (36.7 C)    TempSrc: Oral    SpO2: 99% 97%   Weight:  69 lb (31.3 kg)  Height:   5' (1.524 m)  PainSc:   0-No pain    Isolation Precautions Airborne and Contact precautions  Medications Medications  LORazepam (ATIVAN) tablet 1-4 mg (has no administration in time range)    Or  LORazepam (ATIVAN)  injection 1-4 mg (has no administration in time range)  thiamine (VITAMIN B1) tablet 100 mg (has no administration in time range)    Or  thiamine (VITAMIN B1) injection 100 mg (has no administration in time range)  folic acid (FOLVITE) tablet 1 mg (has no administration in time range)  multivitamin with minerals tablet 1 tablet (has no administration in time range)  0.9 %  sodium chloride infusion (has no administration in time range)  acetaminophen (TYLENOL) tablet 650 mg (has no administration in time range)    Or  acetaminophen (TYLENOL) suppository 650 mg (has no administration in time range)  polyethylene glycol (MIRALAX / GLYCOLAX) packet 17 g (has no administration in time range)  albuterol (PROVENTIL) (2.5 MG/3ML) 0.083% nebulizer solution 2.5 mg (has no administration in time range)  arformoterol (BROVANA) nebulizer solution 15 mcg (has no administration in time range)  budesonide (PULMICORT) nebulizer solution 0.25 mg (has no administration in time range)  revefenacin (YUPELRI) nebulizer solution 175 mcg (has no administration in time range)  amLODipine (NORVASC) tablet 10 mg (has no administration in time range)  ARIPiprazole (ABILIFY) tablet 20 mg (has no administration in time range)  escitalopram (LEXAPRO) tablet 20 mg (has no administration in time range)  rosuvastatin (CRESTOR) tablet 10 mg (has no administration in time range)  enoxaparin (LOVENOX) injection 30 mg (has no administration in time range)  sodium chloride 0.9 % bolus 500 mL (0 mLs Intravenous Stopped 10/17/22 1600)  albuterol (PROVENTIL) (2.5 MG/3ML) 0.083% nebulizer solution 2.5 mg (2.5 mg Nebulization Given 10/17/22 1500)  sodium chloride 0.9 % bolus 500 mL (500 mLs Intravenous New Bag/Given 10/17/22 1641)    Mobility walks

## 2022-10-17 NOTE — ED Triage Notes (Signed)
Per EMS, Pt, from home, c/o SOB and tremor x6 months.  Pt went to see PCP today and was directed to the ED.  Pt reports nothing has changed over the 6months.  Hx of HTN, ETOH abuse, and anorexia.

## 2022-10-17 NOTE — H&P (Addendum)
History and Physical    Patient: Crystal Noble ZOX:[id number] DOB: 1945/10/26 DOA: 10/17/2022 DOS: the patient was seen and examined on 10/17/2022 PCP: Lewis Moccasin, MD  Patient coming from: Home  Chief Complaint:  Chief Complaint  Patient presents with   Shortness of Breath   Tremors   HPI: Crystal Noble is a 77 y.o. female with medical history significant of history of hypertension, CVA, anxiety/depression, tobacco use, alcohol use who was sent from her primary care practitioner's office to the ED for evaluation of 45-month history of unintentional 30 pound weight loss, shortness of breath, tremors.  Please note-patient is a poor historian-it takes multiple attempts to elicit her history.  Apparently family history of pain-she has had exertional dyspnea-for almost 6 months.  There is no orthopnea, leg edema.  She appears comfortable at rest.  For the past several months-she has had mostly intentional tremors that have worsened.  She does not have tremors at rest.  During this time she has had approximately 30 pound unintentional weight loss.  She claims that her appetite is very minimal and that she has hardly been eating.  There is no history of nausea, vomiting or diarrhea.  There is no history of chest pain or headache.  There is no history of hematuria.  She was evaluated in the ED and found to have AKI-given her longstanding history of smoking-she was thought to have COPD exacerbation-TRH service was asked to admit this patient for further evaluation and treatment.   Review of Systems: As mentioned in the history of present illness. All other systems reviewed and are negative. Past Medical History:  Diagnosis Date   Carotid stenosis, asymptomatic    Colitis, collagenous    Hypertension    Stroke [facility]) 06/2019   Past Surgical History:  Procedure Laterality Date   APPENDECTOMY     Social History:  reports that she has been smoking cigarettes. She has a 15.00  pack-year smoking history. She has never used smokeless tobacco. She reports current alcohol use of about 21.0 standard drinks of alcohol per week. She reports that she does not use drugs.  Allergies  Allergen Reactions   Other Other (See Comments)    "Exacerbates my collagenous colitis"   Penicillin G Rash    Family History  Problem Relation Age of Onset   Hypertension Mother    Hyperlipidemia Mother    Heart attack Mother    Depression Mother        Bi-Polar   Cancer Father        Lung    Prior to Admission medications   Medication Sig Start Date End Date Taking? Authorizing Provider  amLODipine (NORVASC) 10 MG tablet Take 1 tablet (10 mg total) by mouth daily. 07/01/20   Kathlen Mody, MD  ARIPiprazole (ABILIFY) 5 MG tablet Take 1 tablet (5 mg total) by mouth daily. 07/01/20   Kathlen Mody, MD  buPROPion (WELLBUTRIN XL) 150 MG 24 hr tablet Take 1 tablet by mouth every morning. Patient not taking: Reported on 01/19/2021 10/25/20   [provider]  clopidogrel (PLAVIX) 75 MG tablet Take 1 tablet (75 mg total) by mouth daily. 07/23/20   Micki Riley, MD  escitalopram (LEXAPRO) 20 MG tablet Take 20 mg by mouth daily. 03/17/20   [provider]  fluticasone-salmeterol (ADVAIR HFA) 230-21 MCG/ACT inhaler Inhale 2 puffs into the lungs 2 (two) times daily.    [provider]  folic acid (FOLVITE) 1 MG tablet Take 1 tablet (  1 mg total) by mouth daily. Patient not taking: No sig reported 07/01/20   Kathlen Mody, MD  montelukast (SINGULAIR) 10 MG tablet Take 10 mg by mouth at bedtime.    [provider]  Multiple Vitamin (MULTIVITAMIN WITH MINERALS) TABS tablet Take 1 tablet by mouth daily. 07/01/20   Kathlen Mody, MD  rivaroxaban (XARELTO) 10 MG TABS tablet Take 1 tablet (10 mg total) by mouth daily. Patient not taking: Reported on 01/19/2021 12/04/20   Merrilyn Puma, MD  rosuvastatin (CRESTOR) 10 MG tablet Take 10 mg by mouth at bedtime. 04/26/20    [provider]  senna-docusate (SENOKOT-S) 8.6-50 MG tablet Take 1 tablet by mouth at bedtime as needed for mild constipation. Patient not taking: No sig reported 06/30/20   Kathlen Mody, MD  thiamine 100 MG tablet Take 1 tablet (100 mg total) by mouth daily. Patient not taking: No sig reported 07/01/20   Kathlen Mody, MD    Physical Exam: Vitals:   10/17/22 1327 10/17/22 1350 10/17/22 1353  BP: 125/69    Pulse: 93    Resp: 17    Temp: 98 F (36.7 C)    TempSrc: Oral    SpO2: 99% 97%   Weight:   31.3 kg  Height:   5' (1.524 m)   Gen Exam:Alert awake-not in any distress.  Anxious-cachectic appearing. HEENT:atraumatic, normocephalic Chest: Clear to auscultation-did not hear any rhonchi or wheezing on my exam.  Distant breath sounds. CVS:S1S2 regular Abdomen:soft non tender, non distended Extremities:no edema Neurology: Non focal Skin: no rash  Data Reviewed:     Latest Ref Rng & Units 10/17/2022    2:16 PM 11/30/2020    2:09 AM 11/29/2020   12:26 PM  CBC  WBC 4.0 - 10.5 K/uL 6.4  6.5  8.7   Hemoglobin 12.0 - 15.0 g/dL 82.9  56.2  13.0   Hematocrit 36.0 - 46.0 % 36.4  39.3  41.9   Platelets 150 - 400 K/uL 252  187  200        Latest Ref Rng & Units 10/17/2022    2:16 PM 12/01/2020    3:09 AM 11/30/2020    2:09 AM  BMP  Glucose 70 - 99 mg/dL 865  784  82   BUN 8 - 23 mg/dL 73  25  21   Creatinine 0.44 - 1.00 mg/dL 6.96  2.95  2.84   Sodium 135 - 145 mmol/L 137  139  136   Potassium 3.5 - 5.1 mmol/L 4.7  3.9  3.9   Chloride 98 - 111 mmol/L 107  100  100   CO2 22 - 32 mmol/L 21  23  22    Calcium 8.9 - 10.3 mg/dL 9.4  9.7  9.6      Assessment and Plan: AKI Suspect this is hemodynamically mediated in the setting of ARB/HCTZ use and poor oral intake. Will hydrate with IVF Check UA to assess for proteinuria, renal ultrasound to ensure no hydronephrosis. Avoid nephrotoxic agents Repeat electrolytes tomorrow.  Shortness of breath Reported shortness of  breath-mostly exertional x 6 months No obvious fluid overload/edema on exam-BNP within normal limits ED MD obtained a D-dimer-which is somewhat elevated-given elevated BUN/creatinine-CT angio not pursued-ED MD has written a dose for empiric therapeutic Lovenox.  Suspect patient to be intermediate probability for VTE at this point-weight loss-she potentially could have malignancy which will probably put her at risk for VTE.  Will start off with a lower extremity Doppler, hydrate with IVF and  hopefully renal function will be much better tomorrow to pursue a CT chest (will also need CT chest/abdomen imaging given significant weight loss).  If not-then VQ scan which has been ordered by ED MD will need to be pursued. No overt signs of CHF-given exertional dyspnea-obtain echo  Cachexia-30 pound unintentional weight loss over the past 6 months Reports poor appetite She is unclear if she has had age-appropriate cancer screening-per PCP note (at bedside) patient's last colonoscopy was 3 years back-and was reportedly normal.  Unclear if she has had other age-appropriate cancer screenings. Check TSH/HIV/B12/folate/iron panel Once creatinine acceptable-needs CT imaging of chest/abdomen to rule out overt malignancy Nutrition/PT/OT eval PCP office note (at bedside) mentions anorexia-unclear whether this is an established diagnosis-but if workup including TSH/HIV/CT imaging is negative-will likely require psychiatric evaluation.  HTN Reportedly on amlodipine 5 mg, losartan/HCTZ 300/25 mg at home Given she has AKI-HCTZ/losartan will be held Increase amlodipine to 10 mg Add as needed hydralazine Hopefully renal function will improve when HCTZ/losartan can be resumed over the next few days.  Anxiety/depression Review of her home medication list (PCP office note at bedside)-shows that she takes 20 mg of Lexapro and 20 mg of Abilify She denies any history of depression/anxiety/schizophrenia Given concern for  potential anorexia-see above-will likely require psych evaluation-specially if weight loss workup is negative.  See above.  Tremors Doubt alcohol use Appear to be intentional Supportive care for now-May need to be started on a beta-blocker prior to discharge.  EtOH use Previously used to drink 1 bottle of wine on daily basis-has cut back over the past year or so-and apparently drinks 1 "shot" of brandy on a daily basis. Her current tremors are unlikely to be related to alcohol use-these appear to be mostly intentional tremors-and disappeared at rest. Watch closely for withdrawal symptoms-starting as needed Ativan per CIWA protocol  Tobacco use Reportedly has cut back to 10 cigarettes a day-prior to that-heavy smoker Counseled  Probable COPD Review of chest x-ray-has very typical appearance of emphysema-given her longtime smoking history-highly likely that she probably has COPD.  Unclear if she has had formal PFTs in the past Although the ED MD thought she possibly was having a COPD exacerbation-on my exam she appears comfortable-slightly tachypneic-and does not have rhonchi or any other findings that suggest ongoing COPD exacerbation.  She is somewhat tachypneic-but when she starts talking-had tachypnea seems to disappear.  Suspect significant anxiety component Starting Brovana/budesonide/Yupelri nebulizers-this will likely need to be converted to a inhaler regimen when closer to discharge.    Prior history of CVA Per patient-she is no longer on aspirin or Plavix.  Did not note any antiplatelets on office PCP note as well. She has no focal deficits on exam.  She will be continued on statin.   Advance Care Planning:   Code Status: DNR.  Note-claims she has advanced directives in place-her niece is a next of kin-however she does not want me to call her today, she claims her advanced directives are very clear-she does not want to be resuscitated in the cardiorespiratory arrest.  Consults:  None  Family Communication: None at bedside-see above documentation.  Severity of Illness: The appropriate patient status for this patient is INPATIENT. Inpatient status is judged to be reasonable and necessary in order to provide the required intensity of service to ensure the patient's safety. The patient's presenting symptoms, physical exam findings, and initial radiographic and laboratory data in the context of their chronic comorbidities is felt to place them at high  risk for further clinical deterioration. Furthermore, it is not anticipated that the patient will be medically stable for discharge from the hospital within 2 midnights of admission.   * I certify that at the point of admission it is my clinical judgment that the patient will require inpatient hospital care spanning beyond 2 midnights from the point of admission due to high intensity of service, high risk for further deterioration and high frequency of surveillance required.*  Author: Jeoffrey Massed, MD 10/17/2022 4:57 PM  For on call review [email or web address].

## 2022-10-18 ENCOUNTER — Inpatient Hospital Stay (HOSPITAL_COMMUNITY): Payer: Medicare HMO

## 2022-10-18 DIAGNOSIS — M7989 Other specified soft tissue disorders: Secondary | ICD-10-CM | POA: Diagnosis not present

## 2022-10-18 DIAGNOSIS — N179 Acute kidney failure, unspecified: Secondary | ICD-10-CM | POA: Diagnosis not present

## 2022-10-18 LAB — CBC
HCT: 30.2 % — ABNORMAL LOW (ref 36.0–46.0)
Hemoglobin: 10 g/dL — ABNORMAL LOW (ref 12.0–15.0)
MCH: 33 pg (ref 26.0–34.0)
MCHC: 33.1 g/dL (ref 30.0–36.0)
MCV: 99.7 fL (ref 80.0–100.0)
Platelets: 199 10*3/uL (ref 150–400)
RBC: 3.03 MIL/uL — ABNORMAL LOW (ref 3.87–5.11)
RDW: 13 % (ref 11.5–15.5)
WBC: 5.9 10*3/uL (ref 4.0–10.5)
nRBC: 0 % (ref 0.0–0.2)

## 2022-10-18 LAB — FOLATE: Folate: >40 ng/mL (ref >5.9)

## 2022-10-18 LAB — HEMOGLOBIN A1C
Hgb A1c MFr Bld: 5.9 % — ABNORMAL HIGH (ref 4.8–5.6)
Mean Plasma Glucose: 122.63 mg/dL

## 2022-10-18 LAB — IRON AND TIBC
Iron: 125 ug/dL (ref 28–170)
Saturation Ratios: 43 % — ABNORMAL HIGH (ref 10.4–31.8)
TIBC: 293 ug/dL (ref 250–450)
UIBC: 168 ug/dL

## 2022-10-18 LAB — COMPREHENSIVE METABOLIC PANEL
ALT: 24 U/L (ref 0–44)
AST: 27 U/L (ref 15–41)
Albumin: 3.5 g/dL (ref 3.5–5.0)
Alkaline Phosphatase: 53 U/L (ref 38–126)
Anion gap: 9 (ref 5–15)
BUN: 53 mg/dL — ABNORMAL HIGH (ref 8–23)
CO2: 19 mmol/L — ABNORMAL LOW (ref 22–32)
Calcium: 8.7 mg/dL — ABNORMAL LOW (ref 8.9–10.3)
Chloride: 107 mmol/L (ref 98–111)
Creatinine, Ser: 1.64 mg/dL — ABNORMAL HIGH (ref 0.44–1.00)
GFR, Estimated: 32 mL/min — ABNORMAL LOW (ref >60)
Glucose, Bld: 90 mg/dL (ref 70–99)
Potassium: 4.1 mmol/L (ref 3.5–5.1)
Sodium: 135 mmol/L (ref 135–145)
Total Bilirubin: 0.9 mg/dL (ref 0.3–1.2)
Total Protein: 5.7 g/dL — ABNORMAL LOW (ref 6.5–8.1)

## 2022-10-18 LAB — RETICULOCYTES
Immature Retic Fract: 9.4 % (ref 2.3–15.9)
RBC.: 3.02 MIL/uL — ABNORMAL LOW (ref 3.87–5.11)
Retic Count, Absolute: 50.7 10*3/uL (ref 19.0–186.0)
Retic Ct Pct: 1.7 % (ref 0.4–3.1)

## 2022-10-18 LAB — FERRITIN: Ferritin: 67 ng/mL (ref 11–307)

## 2022-10-18 LAB — VITAMIN B12: Vitamin B-12: 325 pg/mL (ref 180–914)

## 2022-10-18 LAB — HIV ANTIBODY (ROUTINE TESTING W REFLEX): HIV Screen 4th Generation wRfx: NONREACTIVE

## 2022-10-18 LAB — TSH: TSH: 0.81 u[IU]/mL (ref 0.350–4.500)

## 2022-10-18 MED ORDER — SODIUM CHLORIDE 0.9 % IV SOLN: INTRAVENOUS | Status: DC

## 2022-10-18 MED ORDER — VITAMIN B-12 1000 MCG PO TABS
500.0000 ug | ORAL_TABLET | Freq: Every day | ORAL | Status: DC
  Administered 2022-10-19 – 2022-10-20 (×2): 500 ug via ORAL
  Filled 2022-10-18 (×2): qty 1

## 2022-10-18 MED ORDER — CYANOCOBALAMIN 1000 MCG/ML IJ SOLN
1000.0000 ug | Freq: Once | INTRAMUSCULAR | Status: AC
  Administered 2022-10-18: 1000 ug via INTRAMUSCULAR
  Filled 2022-10-18: qty 1

## 2022-10-18 MED ORDER — LIP MEDEX EX OINT
TOPICAL_OINTMENT | CUTANEOUS | Status: DC | PRN
  Administered 2022-10-18: 75 via TOPICAL
  Filled 2022-10-18: qty 7

## 2022-10-18 MED ORDER — TECHNETIUM TO 99M ALBUMIN AGGREGATED
4.0900 | Freq: Once | INTRAVENOUS | Status: AC | PRN
  Administered 2022-10-18: 4.09 via INTRAVENOUS

## 2022-10-18 MED ORDER — PROSOURCE PLUS PO LIQD
30.0000 mL | Freq: Two times a day (BID) | ORAL | Status: DC
  Administered 2022-10-18 – 2022-10-20 (×4): 30 mL via ORAL
  Filled 2022-10-18 (×4): qty 30

## 2022-10-18 MED ORDER — PREDNISONE 20 MG PO TABS
30.0000 mg | ORAL_TABLET | Freq: Every day | ORAL | Status: DC
  Administered 2022-10-18 – 2022-10-20 (×3): 30 mg via ORAL
  Filled 2022-10-18 (×3): qty 1

## 2022-10-18 NOTE — Progress Notes (Signed)
Initial Nutrition Assessment  DOCUMENTATION CODES:   Underweight  INTERVENTION:   Monitor magnesium, potassium, and phosphorus for at least 3 days, MD to replete as needed, as pt is at risk for refeeding syndrome.  -Prosource Plus PO BID, each provides 100 kcals and 15g protein  -Continue CIWA vitamin supplements  -Liberalize diet given poor PO and likely malnutrition  -If PO intakes do not improve, would benefit from short term nutrition support, recommend small bore NGT placement.   NUTRITION DIAGNOSIS:   Increased nutrient needs related to chronic illness (COPD) as evidenced by estimated needs.  GOAL:   Patient will meet greater than or equal to 90% of their needs  MONITOR:   PO intake, Supplement acceptance, Labs, Weight trends, I & O's  REASON FOR ASSESSMENT:   Consult Assessment of nutrition requirement/status  ASSESSMENT:   77 y.o. female with medical history significant of history of hypertension, CVA, anxiety/depression, tobacco use, alcohol use who was sent from her primary care practitioner's office to the ED for evaluation of 31-month history of unintentional 30 pound weight loss, shortness of breath, tremors.  Patient in room, cousin at bedside. Pt appeared uncomfortable RD was in her room. Pt gave short answers to most questions asked. When asked how she was eating at home, states "I don't like to eat". Observed lunch tray in room, pt ate a few bites of pot roast and mashed potatoes today. For breakfast she states she had a blueberry muffin and some potatoes. Adamantly refuses to drink supplements or anything resembling milk. Asked about clear liquids supplements and pt declines. When asked what she does consume at home, states single items such as chicken, steak ,avocado, sandwiches. Per review of previous nutrition encounters, intake was reported very similar back in 2022. Concerning for disordered eating. Per chart review, family had reported pt was not eating  at all. Pt with history of alcohol use.  Pt denies any issues with swallowing or chewing. Reports taking a MVI at home.  Will liberalize diet, pt was okay with this. Will trial Prosource supplements. Would recommend continuing vitamin supplements as pt is at risk of micronutrient deficiencies.  Per chart review, pt has lost 30 lbs. Per weight records, last weight was 78 lbs in 2022.  Current weight: 69 lbs. Suspect some degree of malnutrition, likely severe, but unable to diagnose.  Medications: Folic acid, Multivitamin with minerals daily, Thiamine  Labs reviewed.  NUTRITION - FOCUSED PHYSICAL EXAM:  Pt declined. Suspect depletions per visual assessment.  Diet Order:   Diet Order             Diet regular Room service appropriate? Yes; Fluid consistency: Thin  Diet effective now                   EDUCATION NEEDS:   Not appropriate for education at this time  Skin:  Skin Assessment: Reviewed RN Assessment  Last BM:  5/6  Height:   Ht Readings from Last 1 Encounters:  10/17/22 5' (1.524 m)    Weight:   Wt Readings from Last 1 Encounters:  10/17/22 31.3 kg    BMI:  Body mass index is 13.48 kg/m.  Estimated Nutritional Needs:   Kcal:  1450-1650  Protein:  65-85g  Fluid:  1.6L/day  Tilda Franco, MS, RD, LDN Inpatient Clinical Dietitian Contact information available via [employer]

## 2022-10-18 NOTE — Progress Notes (Signed)
OT Cancellation Note  Patient Details Name: EDID FUSTER MRN: [id number] DOB: 09/17/1945   Cancelled Treatment:    Reason Eval/Treat Not Completed: Patient at procedure or test/ unavailable Patient is off the hall for procedure at this time. OT To continue to follow and check back as schedule will allow.  Rosalio Loud, MS Acute Rehabilitation Department Office# [phone]  10/18/2022, 10:03 AM

## 2022-10-18 NOTE — Progress Notes (Signed)
Bilateral lower extremity venous duplex has been completed. Preliminary results can be found in CV Proc through chart review.   10/18/22 8:49 AM Olen Cordial RVT

## 2022-10-18 NOTE — Plan of Care (Signed)

## 2022-10-18 NOTE — Progress Notes (Signed)
OT Cancellation Note  Patient Details Name: Crystal Noble MRN: [id number] DOB: Oct 30, 1945   Cancelled Treatment:    Reason Eval/Treat Not Completed: Patient at procedure or test/ unavailable Patient is back off the hall at CT. OT to continue to follow and check back on 10/19/22.  Rosalio Loud, MS Acute Rehabilitation Department Office# [phone]  10/18/2022, 2:55 PM

## 2022-10-18 NOTE — Progress Notes (Signed)
PROGRESS NOTE    Crystal Noble  ZOX:[id number] DOB: 07-20-1945 DOA: 10/17/2022 PCP: Lewis Moccasin, MD   Brief Narrative: 31 with past medical history significant for hypertension, CVA, anxiety/depression, tobacco use, alcohol use presents from primary care office for evaluation of 11-month history of an unintentional 30 pounds weight loss, shortness of breath and intentional tremors. Evaluation in the ED consistent of AKI and due to her long history of smoking it was thought that she was also having COPD exacerbation.    Assessment & Plan:   Principal Problem:   AKI (acute kidney injury) (HCC)   1-AKI: - setting of Poor oral intake and diuretics.  -Renal US; Moderate Left Hydronephrosis.  -Discussed care with Dr Alvester Morin, he recommend CT to rule out stone. If CT abnormal will need to contact urology. As long as renal function continue to improved and if CT negative patient could follow out patient.  -Bladder scan no retention.  -Continue with IV fluids.   Dyspnea: Suspect related to COPD/Emphysema.  -Report improvement with nebulizer.  -Will try short course of prednisone. Prednisone might help with appetitive as well.  -BNP normal.  -V-Q scan Negative for PE, Doppler negative for DVT>  -COntniue with Brovana, budesonide, Yulperi.    Cachexia 30 pounds unintentional weight loss over the last 6 months: -In setting of poor appetite.  -She will need age appropriate cancer screening. She had colonoscopy 3 years ago.  -Denies Odynophagia, or dysphagia.  -She will need CT chest, abdomen pelvis once cr is acceptable.  -Per PCP note mentions Anorexia-unclear if this establish diagnosis. Might need Psych consult if work up unrevealing.   -TSH: 0.810, HIV non reactive.   Hypertension: Continue with Norvasc. It was increase to 10 on admission.  Continue to hold HCTZ/Cozaar in setting of AKI.   Anxiety depression: -Continue with Lexapro and Abilify.   Tremors: Appears to be  intentional.  Supportive care, could consider BB.   EtOH: used to drink 1 bottle of wine daily, has cut down to 1 shot of brandy daily Continue with CIWA>    Tobacco use: Counseling   Prior history of CVA Per patient-she is no longer on aspirin or Plavix. Did not note any antiplatelets on office PCP note as well.    Estimated body mass index is 13.48 kg/m as calculated from the following:   Height as of this encounter: 5' (1.524 m).   Weight as of this encounter: 31.3 kg.   DVT prophylaxis: Lovenox Code Status: DNR Family Communication: Sister who was at bedside.  Disposition Plan:  Status is: Inpatient Remains inpatient appropriate because: management of AKI    Consultants:  None  Procedures:  [place]; lefts side moderate hydronephrosis.   Antimicrobials:    Subjective: She is breathing a little better. She report poor appetitive. Denies   Objective: Vitals:   10/17/22 1816 10/17/22 2124 10/17/22 2151 10/18/22 0520  BP: (!) 158/66  133/64 (!) 143/65  Pulse: 83  77 65  Resp: 16  16 15   Temp: 97.7 F (36.5 C)  98.2 F (36.8 C) (!) 97.5 F (36.4 C)  TempSrc: Oral   Oral  SpO2: 100% 99% 97% 99%  Weight:      Height:        Intake/Output Summary (Last 24 hours) at 10/18/2022 0756 Last data filed at 10/17/2022 2230 Gross per 24 hour  Intake 620 ml  Output --  Net 620 ml   Filed Weights   10/17/22 1353  Weight: 31.3 kg  Examination:  General exam: Cachetic.  Respiratory system: BL air movement Mild tachypnea.  Cardiovascular system: S1 & S2 heard, RRR.  Gastrointestinal system: Abdomen is nondistended, soft and nontender.  Central nervous system: Alert and oriented.  Extremities: Symmetric 5 x 5 power.   Data Reviewed: I have personally reviewed following labs and imaging studies  CBC: Recent Labs  Lab 10/17/22 1416 10/18/22 0537  WBC 6.4 5.9  NEUTROABS 4.9  --   HGB 11.8* 10.0*  HCT 36.4 30.2*  MCV 97.6 99.7  PLT 252 199   Basic  Metabolic Panel: Recent Labs  Lab 10/17/22 1416 10/18/22 0537  NA 137 135  K 4.7 4.1  CL 107 107  CO2 21* 19*  GLUCOSE 101* 90  BUN 73* 53*  CREATININE 1.99* 1.64*  CALCIUM 9.4 8.7*   GFR: Estimated Creatinine Clearance: 14.4 mL/min (A) (by C-G formula based on SCr of 1.64 mg/dL (H)). Liver Function Tests: Recent Labs  Lab 10/17/22 1416 10/18/22 0537  AST 27 27  ALT 29 24  ALKPHOS 68 53  BILITOT 0.6 0.9  PROT 7.1 5.7*  ALBUMIN 4.4 3.5   No results for input(s): "LIPASE", "AMYLASE" in the last 168 hours. No results for input(s): "AMMONIA" in the last 168 hours. Coagulation Profile: No results for input(s): "INR", "PROTIME" in the last 168 hours. Cardiac Enzymes: No results for input(s): "CKTOTAL", "CKMB", "CKMBINDEX", "TROPONINI" in the last 168 hours. BNP (last 3 results) No results for input(s): "PROBNP" in the last 8760 hours. HbA1C: No results for input(s): "HGBA1C" in the last 72 hours. CBG: No results for input(s): "GLUCAP" in the last 168 hours. Lipid Profile: No results for input(s): "CHOL", "HDL", "LDLCALC", "TRIG", "CHOLHDL", "LDLDIRECT" in the last 72 hours. Thyroid Function Tests: Recent Labs    10/18/22 0537  TSH 0.810   Anemia Panel: Recent Labs    10/18/22 0537  VITAMINB12 325  FERRITIN 67  TIBC 293  IRON 125  RETICCTPCT 1.7   Sepsis Labs: No results for input(s): "PROCALCITON", "LATICACIDVEN" in the last 168 hours.  Recent Results (from the past 240 hour(s))  SARS Coronavirus 2 by RT PCR (hospital order, performed in [facility] hospital lab) *cepheid single result test* Anterior Nasal Swab     Status: None   Collection Time: 10/17/22  2:27 PM   Specimen: Anterior Nasal Swab  Result Value Ref Range Status   SARS Coronavirus 2 by RT PCR NEGATIVE NEGATIVE Final    Comment: (NOTE) SARS-CoV-2 target nucleic acids are NOT DETECTED.  The SARS-CoV-2 RNA is generally detectable in upper and lower respiratory specimens during the acute  phase of infection. The lowest concentration of SARS-CoV-2 viral copies this assay can detect is 250 copies / mL. A negative result does not preclude SARS-CoV-2 infection and should not be used as the sole basis for treatment or other patient management decisions.  A negative result may occur with improper specimen collection / handling, submission of specimen other than nasopharyngeal swab, presence of viral mutation(s) within the areas targeted by this assay, and inadequate number of viral copies (<250 copies / mL). A negative result must be combined with clinical observations, patient history, and epidemiological information.  Fact Sheet for Patients:   RoadLapTop.co.za  Fact Sheet for Healthcare Providers: http://kim-miller.com/  This test is not yet approved or  cleared by the [place] FDA and has been authorized for detection and/or diagnosis of SARS-CoV-2 by FDA under an Emergency Use Authorization (EUA).  This EUA will remain in effect (meaning  this test can be used) for the duration of the COVID-19 declaration under Section 564(b)(1) of the Act, 21 U.S.C. section 360bbb-3(b)(1), unless the authorization is terminated or revoked sooner.  Performed at [facility], [address], [place], [place] [address]          Radiology Studies: US RENAL  Result Date: 10/17/2022 CLINICAL DATA:  Acute kidney injury EXAM: RENAL / URINARY TRACT ULTRASOUND COMPLETE COMPARISON:  None Available. FINDINGS: Right Kidney: Renal measurements: 8.2 x 3.7 x 4.4 cm = volume: 70 mL. Echogenicity within normal limits. No mass or hydronephrosis visualized. Left Kidney: Renal measurements: 9.6 x 4.9 x 4.5 cm = volume: 112 mL. Renal cortical thickness is preserved and cortical echogenicity is normal. There is moderate hydronephrosis. No intrarenal masses or calcifications are seen. Bladder: Appears normal for degree of bladder  distention. Bilateral ureteral jets are identified Other: None. IMPRESSION: 1. Moderate left hydronephrosis. Electronically Signed   By: Helyn Numbers M.D.   On: 10/17/2022 21:13   DG Chest 2 View  Result Date: 10/17/2022 CLINICAL DATA:  Shortness of breath EXAM: CHEST - 2 VIEW COMPARISON:  X-ray 09/30/2013.  CT 03/01/2019 FINDINGS: Hyperinflation. No consolidation or pneumothorax. No edema. Normal cardiopericardial silhouette. Calcified aorta. Curvature and degenerative changes along the spine. On the lateral view there is some blunting of the inferior costophrenic angles. Tiny effusion versus pleural thickening. Mitral valve annular calcification. IMPRESSION: Hyperinflation with chronic changes. Tiny effusion versus pleural thickening. Electronically Signed   By: Karen Kays M.D.   On: 10/17/2022 15:33        Scheduled Meds:  amLODipine  10 mg Oral Daily   arformoterol  15 mcg Nebulization BID   ARIPiprazole  20 mg Oral Daily   budesonide (PULMICORT) nebulizer solution  0.25 mg Nebulization BID   enoxaparin (LOVENOX) injection  30 mg Subcutaneous Q24H   escitalopram  20 mg Oral Daily   folic acid  1 mg Oral Daily   multivitamin with minerals  1 tablet Oral Daily   revefenacin  175 mcg Nebulization Daily   rosuvastatin  10 mg Oral QHS   thiamine  100 mg Oral Daily   Or   thiamine  100 mg Intravenous Daily   Continuous Infusions:  sodium chloride 60 mL/hr at 10/17/22 1845     LOS: 1 day    Time spent: 35 minutes    Alysson Geist A Jilliann Subramanian, MD [facility]   If 7PM-7AM, please contact night-coverage [email or web address]  10/18/2022, 7:56 AM

## 2022-10-18 NOTE — Evaluation (Signed)
Physical Therapy Evaluation Patient Details Name: Crystal Noble MRN: [id number] DOB: Jan 01, 1946 Today's Date: 10/18/2022  History of Present Illness  77 yo female admitted with AKI. Hx of CAD, PAD, carotid artery stenosis, ETOH abuse, CVA, anxiety, depression, acetabular fx 2022  Clinical Impression  On eval, pt was Min A for mobility. She walked ~120 feet while holding on to hallway handrail. She is unsteady and would do well with RW use for ambulation safety. Discussed d/c plan with pt-she plans to return home where she lives alone. Recommend continued rehab after hospital stay. Will follow and progress activity as tolerated.       Recommendations for follow up therapy are one component of a multi-disciplinary discharge planning process, led by the attending physician.  Recommendations may be updated based on patient status, additional functional criteria and insurance authorization.  Follow Up Recommendations       Assistance Recommended at Discharge Intermittent Supervision/Assistance  Patient can return home with the following  A little help with walking and/or transfers;A little help with bathing/dressing/bathroom;Assistance with cooking/housework;Assist for transportation;Help with stairs or ramp for entrance    Equipment Recommendations  (RW if pt doesn't already have one)  Recommendations for Other Services  OT consult    Functional Status Assessment Patient has had a recent decline in their functional status and demonstrates the ability to make significant improvements in function in a reasonable and predictable amount of time.     Precautions / Restrictions Precautions Precautions: Fall Restrictions Weight Bearing Restrictions: No      Mobility  Bed Mobility Overal bed mobility: Modified Independent                  Transfers Overall transfer level: Needs assistance Equipment used: None Transfers: Sit to/from Stand Sit to Stand: Min guard            General transfer comment: Min guard for safety.    Ambulation/Gait Ambulation/Gait assistance: Min assist Gait Distance (Feet): 120 Feet Assistive device:  (hallway handrail) Gait Pattern/deviations: Decreased step length - right, Decreased step length - left       General Gait Details: Near shuffle gait pattern. Unsteady. Pt needed at least 1 point of support to steady herself so used hallway handrail for most of distance. She would very likely benefit from RW use for ambulation safety  Stairs            Wheelchair Mobility    Modified Rankin (Stroke Patients Only)       Balance                                             Pertinent Vitals/Pain Pain Assessment Pain Assessment: No/denies pain    Home Living Family/patient expects to be discharged to:: Private residence Living Arrangements: Alone   Type of Home: House Home Access: Stairs to enter Entrance Stairs-Rails: Right;Left;Can reach both Secretary/administrator of Steps: 6   Home Layout: One level Home Equipment: Agricultural consultant (2 wheels)      Prior Function Prior Level of Function : Independent/Modified Independent             Mobility Comments: ambulatory without a device; still drives ADLs Comments: mod ind     Hand Dominance        Extremity/Trunk Assessment   Upper Extremity Assessment Upper Extremity Assessment: Defer to OT evaluation  Lower Extremity Assessment Lower Extremity Assessment: Generalized weakness    Cervical / Trunk Assessment Cervical / Trunk Assessment: Normal  Communication   Communication: No difficulties  Cognition Arousal/Alertness: Awake/alert Behavior During Therapy: WFL for tasks assessed/performed Overall Cognitive Status: Within Functional Limits for tasks assessed                                          General Comments      Exercises     Assessment/Plan    PT Assessment Patient needs continued PT  services  PT Problem List Decreased strength;Decreased range of motion;Decreased activity tolerance;Decreased balance;Decreased knowledge of use of DME       PT Treatment Interventions DME instruction;Gait training;Therapeutic exercise;Balance training;Functional mobility training;Therapeutic activities;Patient/family education    PT Goals (Current goals can be found in the Care Plan section)  Acute Rehab PT Goals Patient Stated Goal: home PT Goal Formulation: With patient Time For Goal Achievement: 11/01/22 Potential to Achieve Goals: Good    Frequency Min 1X/week     Co-evaluation               AM-PAC PT "6 Clicks" Mobility  Outcome Measure Help needed turning from your back to your side while in a flat bed without using bedrails?: None Help needed moving from lying on your back to sitting on the side of a flat bed without using bedrails?: None Help needed moving to and from a bed to a chair (including a wheelchair)?: A Little Help needed standing up from a chair using your arms (e.g., wheelchair or bedside chair)?: A Little Help needed to walk in hospital room?: A Little Help needed climbing 3-5 steps with a railing? : A Little 6 Click Score: 20    End of Session Equipment Utilized During Treatment: Gait belt Activity Tolerance: Patient tolerated treatment well Patient left: in bed;with call bell/phone within reach;with bed alarm set   PT Visit Diagnosis: Difficulty in walking, not elsewhere classified (R26.2);History of falling (Z91.81)    Time: 1610-9604 PT Time Calculation (min) (ACUTE ONLY): 11 min   Charges:   PT Evaluation $PT Eval Low Complexity: 1 Low            Faye Ramsay, PT Acute Rehabilitation  Office: [phone]

## 2022-10-18 NOTE — Consult Note (Signed)
H&P Physician requesting consult: Belkys Regalado  Chief Complaint: Left hydronephrosis, acute renal insufficiency  History of Present Illness: 77 year old female with a history of hypertension, CVA, tobacco use, alcohol use presented with 20-month history of unintentional 30 pound weight loss, shortness of breath, tremors.  Was found to have acute renal insufficiency thought to be secondary to poor oral intake and diuretics.  However, she was found to have moderate left-sided hydronephrosis.  She has been emptying her bladder well.  Baseline creatinine is around 1.  Creatinine upon presentation was 2.  Improved with some fluids today.  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  No right-sided hydronephrosis.  No renal or ureteral calculi.  Patient denies any recurrent UTI, flank pain.  Past Medical History:  Diagnosis Date   Carotid stenosis, asymptomatic    Colitis, collagenous    Hypertension    Stroke (HCC) 06/2019   Past Surgical History:  Procedure Laterality Date   APPENDECTOMY      Home Medications:  Medications Prior to Admission  Medication Sig Dispense Refill Last Dose   amLODipine (NORVASC) 10 MG tablet Take 1 tablet (10 mg total) by mouth daily. 30 tablet 1 10/17/2022   ARIPiprazole (ABILIFY) 20 MG tablet Take 20 mg by mouth daily.   10/17/2022   busPIRone (BUSPAR) 10 MG tablet Take 20 mg by mouth daily.   10/17/2022   Cholecalciferol (VITAMIN D3 ULTRA STRENGTH PO) Take 1 tablet by mouth daily.   10/17/2022   escitalopram (LEXAPRO) 20 MG tablet Take 20 mg by mouth daily.   10/17/2022   rosuvastatin (CRESTOR) 10 MG tablet Take 10 mg by mouth at bedtime.   10/17/2022   valsartan-hydrochlorothiazide (DIOVAN-HCT) 320-25 MG tablet Take 1 tablet by mouth in the morning.   10/17/2022   Allergies:  Allergies  Allergen Reactions   Other Other (See  Comments)    "Exacerbates my collagenous colitis"   Penicillin G Rash    Family History  Problem Relation Age of Onset   Hypertension Mother    Hyperlipidemia Mother    Heart attack Mother    Depression Mother        Bi-Polar   Cancer Father        Lung   Social History:  reports that she has been smoking cigarettes. She has a 15.00 pack-year smoking history. She has never used smokeless tobacco. She reports current alcohol use of about 21.0 standard drinks of alcohol per week. She reports that she does not use drugs.  ROS: A complete review of systems was performed.  All systems are negative except for pertinent findings as noted. ROS   Physical Exam:  Vital signs in last 24 hours: Temp:  [97.5 F (36.4 C)-98.2 F (36.8 C)] 98.2 F (36.8 C) (05/07 1346) Pulse Rate:  [65-85] 80 (05/07 1346) Resp:  [15-19] 19 (05/07 1346) BP: (133-158)/(64-70) 143/70 (05/07 1346) SpO2:  [97 %-100 %] 98 % (05/07 1346) General:  Alert and oriented, No acute distress, thin and frail appearing HEENT: Normocephalic, atraumatic Neck: No JVD or lymphadenopathy Cardiovascular: Regular rate and rhythm Lungs: Regular rate and effort Abdomen: Soft, nontender, nondistended, no abdominal masses Back: No CVA tenderness Extremities: No edema Neurologic: Grossly intact  Laboratory Data:  Results for orders placed or performed during the hospital encounter of 10/17/22 (from the past 24 hour(s))  TSH     Status: None  Collection Time: 10/18/22  5:37 AM  Result Value Ref Range   TSH 0.810 0.350 - 4.500 uIU/mL  Comprehensive metabolic panel     Status: Abnormal   Collection Time: 10/18/22  5:37 AM  Result Value Ref Range   Sodium 135 135 - 145 mmol/L   Potassium 4.1 3.5 - 5.1 mmol/L   Chloride 107 98 - 111 mmol/L   CO2 19 (L) 22 - 32 mmol/L   Glucose, Bld 90 70 - 99 mg/dL   BUN 53 (H) 8 - 23 mg/dL   Creatinine, Ser 2.95 (H) 0.44 - 1.00 mg/dL   Calcium 8.7 (L) 8.9 - 10.3 mg/dL   Total Protein  5.7 (L) 6.5 - 8.1 g/dL   Albumin 3.5 3.5 - 5.0 g/dL   AST 27 15 - 41 U/L   ALT 24 0 - 44 U/L   Alkaline Phosphatase 53 38 - 126 U/L   Total Bilirubin 0.9 0.3 - 1.2 mg/dL   GFR, Estimated 32 (L) >60 mL/min   Anion gap 9 5 - 15  CBC     Status: Abnormal   Collection Time: 10/18/22  5:37 AM  Result Value Ref Range   WBC 5.9 4.0 - 10.5 K/uL   RBC 3.03 (L) 3.87 - 5.11 MIL/uL   Hemoglobin 10.0 (L) 12.0 - 15.0 g/dL   HCT 62.1 (L) 30.8 - 65.7 %   MCV 99.7 80.0 - 100.0 fL   MCH 33.0 26.0 - 34.0 pg   MCHC 33.1 30.0 - 36.0 g/dL   RDW 84.6 96.2 - 95.2 %   Platelets 199 150 - 400 K/uL   nRBC 0.0 0.0 - 0.2 %  HIV Antibody (routine testing w rflx)     Status: None   Collection Time: 10/18/22  5:37 AM  Result Value Ref Range   HIV Screen 4th Generation wRfx Non Reactive Non Reactive  Hemoglobin A1c     Status: Abnormal   Collection Time: 10/18/22  5:37 AM  Result Value Ref Range   Hgb A1c MFr Bld 5.9 (H) 4.8 - 5.6 %   Mean Plasma Glucose 122.63 mg/dL  Vitamin W41     Status: None   Collection Time: 10/18/22  5:37 AM  Result Value Ref Range   Vitamin B-12 325 180 - 914 pg/mL  Folate     Status: None   Collection Time: 10/18/22  5:37 AM  Result Value Ref Range   Folate >40.0 >5.9 ng/mL  Iron and TIBC     Status: Abnormal   Collection Time: 10/18/22  5:37 AM  Result Value Ref Range   Iron 125 28 - 170 ug/dL   TIBC 324 401 - 027 ug/dL   Saturation Ratios 43 (H) 10.4 - 31.8 %   UIBC 168 ug/dL  Ferritin     Status: None   Collection Time: 10/18/22  5:37 AM  Result Value Ref Range   Ferritin 67 11 - 307 ng/mL  Reticulocytes     Status: Abnormal   Collection Time: 10/18/22  5:37 AM  Result Value Ref Range   Retic Ct Pct 1.7 0.4 - 3.1 %   RBC. 3.02 (L) 3.87 - 5.11 MIL/uL   Retic Count, Absolute 50.7 19.0 - 186.0 K/uL   Immature Retic Fract 9.4 2.3 - 15.9 %   Recent Results (from the past 240 hour(s))  SARS Coronavirus 2 by RT PCR (hospital order, performed in [facility] hospital  lab) *cepheid single result test* Anterior Nasal Swab  Status: None   Collection Time: 10/17/22  2:27 PM   Specimen: Anterior Nasal Swab  Result Value Ref Range Status   SARS Coronavirus 2 by RT PCR NEGATIVE NEGATIVE Final    Comment: (NOTE) SARS-CoV-2 target nucleic acids are NOT DETECTED.  The SARS-CoV-2 RNA is generally detectable in upper and lower respiratory specimens during the acute phase of infection. The lowest concentration of SARS-CoV-2 viral copies this assay can detect is 250 copies / mL. A negative result does not preclude SARS-CoV-2 infection and should not be used as the sole basis for treatment or other patient management decisions.  A negative result may occur with improper specimen collection / handling, submission of specimen other than nasopharyngeal swab, presence of viral mutation(s) within the areas targeted by this assay, and inadequate number of viral copies (<250 copies / mL). A negative result must be combined with clinical observations, patient history, and epidemiological information.  Fact Sheet for Patients:   RoadLapTop.co.za  Fact Sheet for Healthcare Providers: http://kim-miller.com/  This test is not yet approved or  cleared by the [place] FDA and has been authorized for detection and/or diagnosis of SARS-CoV-2 by FDA under an Emergency Use Authorization (EUA).  This EUA will remain in effect (meaning this test can be used) for the duration of the COVID-19 declaration under Section 564(b)(1) of the Act, 21 U.S.C. section 360bbb-3(b)(1), unless the authorization is terminated or revoked sooner.  Performed at [facility], [address] [place], [place] [address]    Creatinine: Recent Labs    10/17/22 1416 10/18/22 0537  CREATININE 1.99* 1.64*    Impression/Assessment:  Left ureteropelvic junction obstruction, likely congenital Acute renal insufficiency,  improving  Plan:  Renal function is improving, she has no flank pain, and is not infected.  Recommend against any urological intervention.  She is elderly and frail.  Will try to avoid any kind of procedural intervention unless absolutely necessary.  Further, she is not symptomatic and a stent placement would likely give her discomfort and voiding complaints.  She can follow-up outpatient.  Ray Church, III 10/18/2022, 5:47 PM

## 2022-10-19 ENCOUNTER — Inpatient Hospital Stay (HOSPITAL_COMMUNITY): Payer: Medicare HMO

## 2022-10-19 DIAGNOSIS — N179 Acute kidney failure, unspecified: Secondary | ICD-10-CM | POA: Diagnosis not present

## 2022-10-19 LAB — PHOSPHORUS: Phosphorus: 4.2 mg/dL (ref 2.5–4.6)

## 2022-10-19 LAB — BASIC METABOLIC PANEL
Anion gap: 7 (ref 5–15)
BUN: 52 mg/dL — ABNORMAL HIGH (ref 8–23)
CO2: 19 mmol/L — ABNORMAL LOW (ref 22–32)
Calcium: 8.5 mg/dL — ABNORMAL LOW (ref 8.9–10.3)
Chloride: 109 mmol/L (ref 98–111)
Creatinine, Ser: 1.54 mg/dL — ABNORMAL HIGH (ref 0.44–1.00)
GFR, Estimated: 35 mL/min — ABNORMAL LOW (ref >60)
Glucose, Bld: 117 mg/dL — ABNORMAL HIGH (ref 70–99)
Potassium: 4.4 mmol/L (ref 3.5–5.1)
Sodium: 135 mmol/L (ref 135–145)

## 2022-10-19 LAB — MAGNESIUM: Magnesium: 1.7 mg/dL (ref 1.7–2.4)

## 2022-10-19 MED ORDER — SODIUM CHLORIDE 0.9 % IV SOLN: INTRAVENOUS | Status: AC

## 2022-10-19 NOTE — Evaluation (Addendum)
Occupational Therapy Evaluation Patient Details Name: Crystal Noble MRN: [id number] DOB: 05-31-1946 Today's Date: 10/19/2022   History of Present Illness 77 yo female admitted with AKI. Hx of CAD, PAD, carotid artery stenosis, ETOH abuse, CVA, anxiety, depression, acetabular fx 2022   Clinical Impression   This 77 yo female admitted with above presents to acute OT PLOF of being totally independent with basic ADLs, IADLs, and driving and using an AD for ambulation. Currently she has less energy, decreased balance, and  SOB with activity thus impacting her safety and independence with above activities and needing setup/S-min A for above at a non AD level. She will continue to benefit from acute OT with follow up HHOT and really would recommend 24/7 S/prn A the first few days home to make sure all is going well. We will continue to follow.     Recommendations for follow up therapy are one component of a multi-disciplinary discharge planning process, led by the attending physician.  Recommendations may be updated based on patient status, additional functional criteria and insurance authorization.   Assistance Recommended at Discharge PRN  Patient can return home with the following A little help with bathing/dressing/bathroom;A little help with walking and/or transfers;Assistance with cooking/housework;Assist for transportation;Help with stairs or ramp for entrance    Functional Status Assessment  Patient has had a recent decline in their functional status and demonstrates the ability to make significant improvements in function in a reasonable and predictable amount of time.  Equipment Recommendations  Tub/shower seat       Precautions / Restrictions Precautions Precautions: Fall Restrictions Weight Bearing Restrictions: No      Mobility Bed Mobility Overal bed mobility: Modified Independent                  Transfers Overall transfer level: Needs assistance Equipment  used: None Transfers: Sit to/from Stand Sit to Stand: Supervision           General transfer comment: min-min guard A ambulation around the room without AD      Balance Overall balance assessment: Needs assistance Sitting-balance support: No upper extremity supported, Feet supported Sitting balance-Leahy Scale: Good     Standing balance support: No upper extremity supported Standing balance-Leahy Scale: Fair Standing balance comment: standing at sink to brush teeth                           ADL either performed or assessed with clinical judgement   ADL Overall ADL's : Needs assistance/impaired Eating/Feeding: Independent;Sitting   Grooming: Set up;Supervision/safety;Standing;Oral care   Upper Body Bathing: Set up;Supervision/ safety;Sitting   Lower Body Bathing: Set up;Supervison/ safety;Sit to/from stand   Upper Body Dressing : Supervision/safety;Set up;Sitting   Lower Body Dressing: Set up;Supervision/safety;Sit to/from stand   Toilet Transfer: Min guard;Ambulation;Comfort height toilet Toilet Transfer Details (indicate cue type and reason): no AD and no use of GB Toileting- Clothing Manipulation and Hygiene: Supervision/safety;Sit to/from stand               Vision Baseline Vision/History: 1 Wears glasses Ability to See in Adequate Light: 0 Adequate Patient Visual Report: No change from baseline              Pertinent Vitals/Pain Pain Assessment Pain Assessment: No/denies pain     Hand Dominance Right   Extremity/Trunk Assessment Upper Extremity Assessment Upper Extremity Assessment: Overall WFL for tasks assessed           Communication Communication  Communication: No difficulties   Cognition Arousal/Alertness: Awake/alert Behavior During Therapy: WFL for tasks assessed/performed Overall Cognitive Status: Within Functional Limits for tasks assessed                                                  Home  Living Family/patient expects to be discharged to:: Private residence Living Arrangements: Alone Available Help at Discharge:  (pt reports no family or friends available to A) Type of Home: House Home Access: Stairs to enter [employer] of Steps: 6 Entrance Stairs-Rails: Right;Left;Can reach both Home Layout: One level     Bathroom Shower/Tub: Walk-in shower;Door   [employer] Toilet: Standard     Home Equipment: Agricultural consultant (2 wheels)          Prior Functioning/Environment Prior Level of Function : Independent/Modified Independent             Mobility Comments: ambulatory without a device; still drives ADLs Comments: Independent        OT Problem List: Impaired balance (sitting and/or standing);Cardiopulmonary status limiting activity      OT Treatment/Interventions: Self-care/ADL training;DME and/or AE instruction;Patient/family education;Balance training;Energy conservation    OT Goals(Current goals can be found in the care plan section) Acute Rehab OT Goals Patient Stated Goal: to go home OT Goal Formulation: With patient Time For Goal Achievement: 11/09/22 Potential to Achieve Goals: Good ADL Goals Pt Will Perform Grooming: Independently;standing Pt Will Perform Upper Body Bathing: Independently;sitting Pt Will Perform Lower Body Bathing: Independently;sit to/from stand Pt Will Perform Upper Body Dressing: Independently;sitting Pt Will Perform Lower Body Dressing: Independently;sit to/from stand Pt Will Transfer to Toilet: Independently;ambulating;regular height toilet Pt Will Perform Toileting - Clothing Manipulation and hygiene: Independently;sit to/from stand Pt Will Perform Tub/Shower Transfer: Shower transfer;Independently;ambulating;shower seat Additional ADL Goal #1: Pt will be aware of energy conservation strategies from handout that may be of benefit to her.  OT Frequency: Min 2X/week       AM-PAC OT "6 Clicks" Daily Activity      Outcome Measure Help from another person eating meals?: None Help from another person taking care of personal grooming?: A Little Help from another person toileting, which includes using toliet, bedpan, or urinal?: A Little Help from another person bathing (including washing, rinsing, drying)?: A Little Help from another person to put on and taking off regular upper body clothing?: A Little Help from another person to put on and taking off regular lower body clothing?: A Little 6 Click Score: 19   End of Session Nurse Communication:  (sats and HR due to pt being SOB with activity)  Activity Tolerance:  (pt appeared and sounded SOB when she first sat up on EOB and after up and about in room. Sats 100% on RA with HR up from 70's to 80's-90's; but lowered quickly after she sat down and rested (<30 seconds)) Patient left: in bed;with call bell/phone within reach;with bed alarm set  OT Visit Diagnosis: Unsteadiness on feet (R26.81);Other abnormalities of gait and mobility (R26.89);Muscle weakness (generalized) (M62.81)                Time: 1610-9604 OT Time Calculation (min): 24 min Charges:  OT General Charges $OT Visit: 1 Visit OT Evaluation $OT Eval Moderate Complexity: 1 Mod OT Treatments $Self Care/Home Management : 8-22 mins  Lindon Romp OT Acute Rehabilitation Services Office [phone]  Evette Georges 10/19/2022, 11:12 AM

## 2022-10-19 NOTE — Plan of Care (Signed)

## 2022-10-19 NOTE — Progress Notes (Signed)
PROGRESS NOTE  Crystal Noble ZOX:[id number] DOB: 1945-07-08 DOA: 10/17/2022 PCP: Lewis Moccasin, MD   LOS: 2 days   Brief Narrative / Interim history: 77 year old female with HTN, prior CVA, anxiety/depression, tobacco use, EtOH use comes into the hospital for an apparent unintentional 30 pound weight loss in the last 6 months.  Subjective / 24h Interval events: She is feeling okay this morning, complains of shortness of breath, tells me that this is persistent and has been going on for several weeks/months.  Assesement and Plan: Principal problem Acute kidney injury -this is likely in the setting of poor oral intake as well as using home diuretics.  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  Urology consulted, without any flank pain, fevers, urinary retention did not recommend any interventions.  Creatinine continues to improve today.  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  Continue Brovana, Pulmicort, was started on steroids and will do a quick taper.  VQ scan negative for PE, lower extremity Dopplers negative for DVT  Cachexia, weight loss -patient initially told me that she has lost 30 pounds over the last 6 months.  She later retracted that, thinking that she lost this much weight about 2 years ago when she had a right acetabular fracture.  It was managed nonoperatively.  She also tells me that it is becoming increasingly difficult for her to get to the grocery store but usually makes it once a week.  I raised the query on whether she eats less so that food lasts longer, however she states that she has plenty of food at home, just no appetite. -Screen with a CT scan of the chest abdomen pelvis.  Denies any difficulty swallowing, any dysphagia.  She denies depression/anxiety.  TSH was  unremarkable.  Hypertension - Continue with Norvasc.  Hold HCTZ/valsartan   Anxiety depression -Continue with Lexapro and Abilify.    Tremors - Appears to be intentional. Supportive care, could consider BB.    EtOH - used to drink 1 bottle of wine daily, has cut down to 1 shot of brandy daily.  Does not appear to be withdrawing    Tobacco use - Counseling regarding cessation   Scheduled Meds:  (feeding supplement) PROSource Plus  30 mL Oral BID BM   amLODipine  10 mg Oral Daily   arformoterol  15 mcg Nebulization BID   ARIPiprazole  10 mg Oral Daily   budesonide (PULMICORT) nebulizer solution  0.25 mg Nebulization BID   vitamin B-12  500 mcg Oral Daily   enoxaparin (LOVENOX) injection  30 mg Subcutaneous Q24H   escitalopram  20 mg Oral Daily   folic acid  1 mg Oral Daily   multivitamin with minerals  1 tablet Oral Daily   predniSONE  30 mg Oral Daily   revefenacin  175 mcg Nebulization Daily   thiamine  100 mg Oral Daily   Or   thiamine  100 mg Intravenous Daily   Continuous Infusions:  sodium chloride 60 mL/hr at 10/18/22 1631   PRN Meds:.acetaminophen **OR** acetaminophen, albuterol, lip balm, LORazepam **OR** LORazepam, polyethylene glycol  Current Outpatient Medications  Medication Instructions   amLODipine (NORVASC) 10 mg, Oral, Daily   ARIPiprazole (ABILIFY) 20 mg, Oral, Daily   busPIRone (BUSPAR) 20 mg, Oral, Daily   Cholecalciferol (VITAMIN D3 ULTRA STRENGTH PO) 1 tablet, Oral,  Daily   escitalopram (LEXAPRO) 20 mg, Oral, Daily   rosuvastatin (CRESTOR) 10 mg, Oral, Daily at bedtime   valsartan-hydrochlorothiazide (DIOVAN-HCT) 320-25 MG tablet 1 tablet, Oral, Every morning    Diet Orders (From admission, onward)     Start     Ordered   10/18/22 1143  Diet regular Room service appropriate? Yes; Fluid consistency: Thin  Diet effective now       Question Answer Comment  Room service appropriate? Yes   Fluid consistency: Thin      10/18/22 1143             DVT prophylaxis: enoxaparin (LOVENOX) injection 30 mg Start: 10/17/22 1800   Lab Results  Component Value Date   PLT 199 10/18/2022      Code Status: DNR  Family Communication: no family at bedside   Status is: Inpatient  Remains inpatient appropriate because: AKI, continue fluids  Level of care: Telemetry  Consultants:  none  Objective: Vitals:   10/18/22 2059 10/19/22 0513 10/19/22 0819 10/19/22 1020  BP:  126/72  128/63  Pulse:  73    Resp:  16    Temp:  97.7 F (36.5 C)    TempSrc:  Oral    SpO2: 98% 100% 99%   Weight:      Height:        Intake/Output Summary (Last 24 hours) at 10/19/2022 1024 Last data filed at 10/19/2022 0904 Gross per 24 hour  Intake 1449.43 ml  Output 820 ml  Net 629.43 ml   Wt Readings from Last 3 Encounters:  10/17/22 31.3 kg  11/29/20 35.5 kg  07/23/20 35.5 kg    Examination:  Constitutional: NAD Eyes: no scleral icterus ENMT: Mucous membranes are moist.  Neck: normal, supple Respiratory: clear to auscultation bilaterally, no wheezing, no crackles. Normal respiratory effort. No accessory muscle use.  Cardiovascular: Regular rate and rhythm, no murmurs / rubs / gallops. No LE edema.  Abdomen: non distended, no tenderness. Bowel sounds positive.  Musculoskeletal: no clubbing / cyanosis.   Data Reviewed: I have independently reviewed following labs and imaging studies   CBC Recent Labs  Lab 10/17/22 1416 10/18/22 0537  WBC 6.4 5.9  HGB 11.8* 10.0*  HCT 36.4 30.2*  PLT 252 199  MCV 97.6 99.7  MCH 31.6 33.0  MCHC 32.4 33.1  RDW 12.9 13.0  LYMPHSABS 1.1  --   MONOABS 0.4  --   EOSABS 0.0  --   BASOSABS 0.0  --     Recent Labs  Lab 10/17/22 1416 10/17/22 1449 10/18/22 0537 10/19/22 0528  NA 137  --  135 135  K 4.7  --  4.1 4.4  CL 107  --  107 109  CO2 21*  --  19* 19*  GLUCOSE 101*  --  90 117*  BUN 73*  --  53* 52*  CREATININE 1.99*  --  1.64* 1.54*  CALCIUM 9.4  --  8.7* 8.5*  AST 27  --  27  --    ALT 29  --  24  --   ALKPHOS 68  --  53  --   BILITOT 0.6  --  0.9  --   ALBUMIN 4.4  --  3.5  --   MG  --   --   --  1.7  DDIMER  --  1.12*  --   --   TSH  --   --  0.810  --   HGBA1C  --   --  5.9*  --   BNP  --  72.9  --   --     ------------------------------------------------------------------------------------------------------------------ No results for input(s): "CHOL", "HDL", "LDLCALC", "TRIG", "CHOLHDL", "LDLDIRECT" in the last 72 hours.  Lab Results  Component Value Date   HGBA1C 5.9 (H) 10/18/2022   ------------------------------------------------------------------------------------------------------------------ Recent Labs    10/18/22 0537  TSH 0.810    Cardiac Enzymes No results for input(s): "CKMB", "TROPONINI", "MYOGLOBIN" in the last 168 hours.  Invalid input(s): "CK" ------------------------------------------------------------------------------------------------------------------    Component Value Date/Time   BNP 72.9 10/17/2022 1449    CBG: No results for input(s): "GLUCAP" in the last 168 hours.  Recent Results (from the past 240 hour(s))  SARS Coronavirus 2 by RT PCR (hospital order, performed in [facility] hospital lab) *cepheid single result test* Anterior Nasal Swab     Status: None   Collection Time: 10/17/22  2:27 PM   Specimen: Anterior Nasal Swab  Result Value Ref Range Status   SARS Coronavirus 2 by RT PCR NEGATIVE NEGATIVE Final    Comment: (NOTE) SARS-CoV-2 target nucleic acids are NOT DETECTED.  The SARS-CoV-2 RNA is generally detectable in upper and lower respiratory specimens during the acute phase of infection. The lowest concentration of SARS-CoV-2 viral copies this assay can detect is 250 copies / mL. A negative result does not preclude SARS-CoV-2 infection and should not be used as the sole basis for treatment or other patient management decisions.  A negative result may occur with improper specimen collection / handling,  submission of specimen other than nasopharyngeal swab, presence of viral mutation(s) within the areas targeted by this assay, and inadequate number of viral copies (<250 copies / mL). A negative result must be combined with clinical observations, patient history, and epidemiological information.  Fact Sheet for Patients:   RoadLapTop.co.za  Fact Sheet for Healthcare Providers: http://kim-miller.com/  This test is not yet approved or  cleared by the [place] FDA and has been authorized for detection and/or diagnosis of SARS-CoV-2 by FDA under an Emergency Use Authorization (EUA).  This EUA will remain in effect (meaning this test can be used) for the duration of the COVID-19 declaration under Section 564(b)(1) of the Act, 21 U.S.C. section 360bbb-3(b)(1), unless the authorization is terminated or revoked sooner.  Performed at [facility], [address] [place], [place] [address]      Radiology Studies: CT RENAL STONE STUDY  Result Date: 10/18/2022 CLINICAL DATA:  Right flank pain. EXAM: CT ABDOMEN AND PELVIS WITHOUT CONTRAST TECHNIQUE: Multidetector CT imaging of the abdomen and pelvis was performed following the standard protocol without IV contrast. RADIATION DOSE REDUCTION: This exam was performed according to the departmental dose-optimization program which includes automated exposure control, adjustment of the mA and/or kV according to patient size and/or use of iterative reconstruction technique. COMPARISON:  Renal ultrasound yesterday FINDINGS: Lower chest: Dense mitral annulus calcifications. Small left and trace right pleural effusions with adjacent atelectasis. Remote bilateral lower rib fractures. Hepatobiliary: Unremarkable unenhanced appearance of the liver. Gallbladder physiologically distended, no calcified stone. No biliary dilatation. Pancreas: Suboptimally assessed in the absence of contrast and paucity  of intra-abdominal fat. No ductal dilatation or inflammation. Spleen: Normal in size without focal abnormality. Adrenals/Urinary Tract: No adrenal nodule. There is no right hydronephrosis or renal stone. No evidence of focal renal abnormality of the right kidney. There is moderate left hydronephrosis. Transition from dilated to nondilated at the ureteropelvic junction. No left-sided stone. Partially distended urinary bladder. No bladder stone. Stomach/Bowel: Bowel  assessment is significantly limited in the absence of contrast and paucity of intra-abdominal fat. There is a small hiatal hernia with wall thickening of the distal esophagus. Ingested material within the stomach which appears mildly dilated. Moderate volume of stool throughout the colon. The appendix is not definitively seen. Listed history of appendectomy. Vascular/Lymphatic: Advanced aortic and branch atherosclerosis. Coarse aortic calcifications in the distal aorta likely cause greater than 50% luminal stenosis. Limited assessment for adenopathy in the absence of contrast and paucity of intra-abdominal fat. No obvious bulky adenopathy. Reproductive: Suboptimal assessment of the uterus and adnexa. Other: No large volume ascites or free intra-abdominal air. Minimal air within the anterior abdominal wall typical of medication injection sites. Musculoskeletal: Lumbar scoliosis and diffuse degenerative change. There are no acute or suspicious osseous abnormalities. IMPRESSION: 1. Moderate left hydronephrosis with transition from dilated to nondilated at the ureteropelvic junction. No left-sided stone or cause of obstruction is seen. 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 2. No right hydronephrosis or stone to explain right flank pain. 3. Small hiatal hernia with wall thickening of the distal esophagus, suggesting reflux or esophagitis. 4. Small left and trace right pleural effusions with adjacent atelectasis. Aortic Atherosclerosis  (ICD10-I70.0). Electronically Signed   By: Narda Rutherford M.D.   On: 10/18/2022 15:22     Pamella Pert, MD, PhD [facility]  Between 7 am - 7 pm I am available, please contact me via Amion (for emergencies) or Securechat (non urgent messages)  Between 7 pm - 7 am I am not available, please contact night coverage MD/APP via Amion

## 2022-10-19 NOTE — Progress Notes (Signed)
Physical Therapy Treatment Patient Details Name: Crystal Noble MRN: [id number] DOB: [birth year] Today's Date: 10/19/2022   History of Present Illness 77 yo female admitted with AKI. Hx of CAD, PAD, carotid artery stenosis, ETOH abuse, CVA, anxiety, depression, acetabular fx 2022    PT Comments    Pt agreeable to work with therapy. Progressing with mobility.  Improved stability, gait pattern, and gait speed with use of walker. Pt tolerated activity well.   Recommendations for follow up therapy are one component of a multi-disciplinary discharge planning process, led by the attending physician.  Recommendations may be updated based on patient status, additional functional criteria and insurance authorization.  Follow Up Recommendations       Assistance Recommended at Discharge Intermittent Supervision/Assistance  Patient can return home with the following A little help with walking and/or transfers;A little help with bathing/dressing/bathroom;Assistance with cooking/housework;Assist for transportation;Help with stairs or ramp for entrance   Equipment Recommendations   (pt states she already has a walker)    Recommendations for Other Services OT consult     Precautions / Restrictions Precautions Precautions: Fall Restrictions Weight Bearing Restrictions: No     Mobility  Bed Mobility Overal bed mobility: Modified Independent                  Transfers Overall transfer level: Needs assistance Equipment used: None Transfers: Sit to/from Stand Sit to Stand: Supervision           General transfer comment: Supv for safety.    Ambulation/Gait Ambulation/Gait assistance: Supervision Gait Distance (Feet): 300 Feet Assistive device: Rollator (4 wheels) Gait Pattern/deviations: Step-through pattern, Decreased stride length       General Gait Details: Improved gait pattern and gait speed with use of walker. Pt denied dizziness. Tolerated activity  well.   Stairs             Wheelchair Mobility    Modified Rankin (Stroke Patients Only)       Balance Overall balance assessment: Needs assistance         Standing balance support: During functional activity Standing balance-Leahy Scale: Fair                              Cognition Arousal/Alertness: Awake/alert Behavior During Therapy: WFL for tasks assessed/performed Overall Cognitive Status: Within Functional Limits for tasks assessed                                 General Comments: not very talkative        Exercises      General Comments        Pertinent Vitals/Pain Pain Assessment Pain Assessment: No/denies pain    Home Living                          Prior Function            PT Goals (current goals can now be found in the care plan section) Progress towards PT goals: Progressing toward goals    Frequency    Min 1X/week      PT Plan Current plan remains appropriate    Co-evaluation              AM-PAC PT "6 Clicks" Mobility   Outcome Measure  Help needed turning from your back to your side while in a flat  bed without using bedrails?: None Help needed moving from lying on your back to sitting on the side of a flat bed without using bedrails?: None Help needed moving to and from a bed to a chair (including a wheelchair)?: A Little Help needed standing up from a chair using your arms (e.g., wheelchair or bedside chair)?: A Little Help needed to walk in hospital room?: A Little Help needed climbing 3-5 steps with a railing? : A Little 6 Click Score: 20    End of Session Equipment Utilized During Treatment: Gait belt Activity Tolerance: Patient tolerated treatment well Patient left: in bed;with call bell/phone within reach;with bed alarm set   PT Visit Diagnosis: Difficulty in walking, not elsewhere classified (R26.2)     Time: 2956-2130 PT Time Calculation (min) (ACUTE ONLY): 11  min  Charges:  $Gait Training: 8-22 mins                         Faye Ramsay, PT Acute Rehabilitation  Office: [phone]

## 2022-10-19 NOTE — TOC Initial Note (Addendum)
Transition of Care [facility]) - Initial/Assessment Note    Patient Details  Name: Crystal Noble MRN: [id number] Date of Birth: [birth year]  Transition of Care [facility]) CM/SW Contact:    Durenda Guthrie, RN Phone Number: 10/19/2022, 10:11 AM  Clinical Narrative:                 Case Manager spoke with patient concerning recommendation for Home Health therapy. Discussed HH agencies, patient has no preference, referral called to [facility] Liaison, Christian. Patient states that she has a rolling walker at home, and that her son is available to assist her.  10:49: CM notified by OT therapist that patient informed her she has no assistance with getting groceries or doing household chores. CM will ask for SW to follow at home as well as [facility].   Expected Discharge Plan: Home w Home Health Services Barriers to Discharge: Continued Medical Work up   Patient Goals and CMS Choice     Choice offered to / list presented to : Patient      Expected Discharge Plan and Services In-house Referral: NA Discharge Planning Services: CM Consult Post Acute Care Choice: Home Health Living arrangements for the past 2 months: Single Family Home                 DME Arranged: N/A (has rolling walker) DME Agency: NA       HH Arranged: PT HH Agency: [facility] Date HH Agency Contacted: 10/19/22 Time HH Agency Contacted: 1000 Representative spoke with at [facility] Agency: Christian  Prior Living Arrangements/Services Living arrangements for the past 2 months: Single Family Home Lives with:: Self Patient language and need for interpreter reviewed:: Yes Do you feel safe going back to the place where you live?: Yes      Need for Family Participation in Patient Care: No (Comment) (has been independent, has family support) Care giver support system in place?: Yes (comment) Current home services: Home PT Criminal Activity/Legal Involvement Pertinent to Current Situation/Hospitalization: No - Comment as  needed  Activities of Daily Living Home Assistive Devices/Equipment: None ADL Screening (condition at time of admission) Is the patient deaf or have difficulty hearing?: No Does the patient have difficulty seeing, even when wearing glasses/contacts?: Yes Does the patient have difficulty concentrating, remembering, or making decisions?: No Does the patient have difficulty dressing or bathing?: No Does the patient have difficulty walking or climbing stairs?: Yes  Permission Sought/Granted         Permission granted to share info w AGENCY: [facility]        Emotional Assessment   Attitude/Demeanor/Rapport: Gracious   Orientation: : Oriented to Self, Oriented to Place, Oriented to  Time, Oriented to Situation Alcohol / Substance Use: Not Applicable Psych Involvement: No (comment)  Admission diagnosis:  Respiratory distress [R06.03] AKI (acute kidney injury) (HCC) [N17.9] Patient Active Problem List   Diagnosis Date Noted   Protein-calorie malnutrition, severe 12/01/2020   Mild cognitive impairment 11/30/2020   Right acetabular fracture (HCC) 11/29/2020   Hypokalemia 06/26/2020   Hypomagnesemia 06/26/2020   Alcohol abuse 06/26/2020   Anxiety disorder 06/26/2020   COPD (chronic obstructive pulmonary disease) (HCC) 06/26/2020   Falls 06/26/2020   Multiple falls    AKI (acute kidney injury) (HCC) 06/25/2020   PAD (peripheral artery disease) (HCC) 08/14/2019   Mixed hyperlipidemia 08/14/2019   Exertional dyspnea 05/13/2019   Coronary artery disease involving native coronary artery of native heart without angina pectoris 03/27/2019   Aortic atherosclerosis (HCC)  03/27/2019   Tobacco abuse 03/27/2019   Essential hypertension 03/27/2019   Asymptomatic carotid artery stenosis without infarction 03/27/2014   PCP:  Lewis Moccasin, MD Pharmacy:   [facility] [phone] - [place], [place] - [address] [place] [address] Phone: [phone] Fax:  [phone]     Social Determinants of Health (SDOH) Social History: SDOH Screenings   Food Insecurity: No Food Insecurity (10/17/2022)  Housing: Low Risk  (01/19/2021)  Transportation Needs: No Transportation Needs (10/17/2022)  Utilities: Not At Risk (10/17/2022)  Depression (PHQ2-9): Low Risk  (01/19/2021)  Tobacco Use: High Risk (10/17/2022)   SDOH Interventions:     Readmission Risk Interventions     No data to display

## 2022-10-19 NOTE — Consult Note (Signed)
   [facility] CM Inpatient Consult  [facility]) Accountable Care Organization [facility] Liaison Note  10/19/2022  Crystal Noble 01/24/1946 [id number]  Hospital Referral:  Inpatient OT and Inpatient [facility] RNCM via secure chat  Location: [facility] Liaison screened the patient remotely at [facility].  Insurance: [employer] Medicare   Crystal Noble is a 77 y.o. female who is a Primary Care Patient of Lewis Moccasin, MD. The patient was screened for a referral 1 IP/0 ED in 6 months.  The patient was assessed for potential [facility] Care Management service needs for post hospital transition for care coordination. Review of patient's electronic medical record reveals patient is from home.   Called patient at the bedside phone [operator assisted line] at [facility], HIPAA verified.  Explained to patient referral and [facility] Care Management assistance in post hospital transitional follow up.  Patient endorses her PCP.  Patient states she has minimal contacts, she states Vevelyn Pat listed is no longer her contact person, however, Lavetta Nielsen [phone] (listed in demographics) can assist, "when she can" patient states.  Patient states she drives however she feels she is too weak to get out and grocery shop.  Asked patient had she considered any hiring for private pay help and she states, "I have never thought about that."  Explained that [facility] Care Coordinators can assist her and checking to see if she has the Meal benefit with [facility] for post hospital needs.  Plan: [facility] Liaison will continue to follow progress and disposition to asess for post hospital community care coordination/management needs.  Referral request for community care coordination: closer to pending disposition.   [facility] Care Management/Population Health does not replace or interfere with any arrangements made by the Inpatient Transition of Care team.   For  questions contact:   Charlesetta Shanks, RN BSN CCM [facility] [place] [facility]  [phone] business mobile phone Toll free office [phone]  *Concierge Line  [phone] Fax number: [phone] [email or web address]     .

## 2022-10-19 NOTE — TOC Progression Note (Signed)
Transition of Care [facility]) - Progression Note    Patient Details  Name: Crystal Noble MRN: [id number] Date of Birth: [birth year]  Transition of Care [facility]) CM/SW Contact  Otelia Santee, LCSW Phone Number: 10/19/2022, 12:45 PM  Clinical Narrative:    [facility] consulted for SA resources. Met with pt who shares she drinks a glass of brandy on a daily basis and smokes 10 cigarettes per day. Pt declines all resources at this time.   HHPT/OT/SW arranged with [facility].    Expected Discharge Plan: Home w Home Health Services Barriers to Discharge: Continued Medical Work up  Expected Discharge Plan and Services In-house Referral: NA Discharge Planning Services: CM Consult Post Acute Care Choice: Home Health Living arrangements for the past 2 months: Single Family Home                 DME Arranged: N/A (has rolling walker) DME Agency: NA       HH Arranged: PT HH Agency: [facility] Date HH Agency Contacted: 10/19/22 Time HH Agency Contacted: 1000 Representative spoke with at [facility] Agency: Christian   Social Determinants of Health (SDOH) Interventions SDOH Screenings   Food Insecurity: No Food Insecurity (10/17/2022)  Housing: Low Risk  (01/19/2021)  Transportation Needs: No Transportation Needs (10/17/2022)  Utilities: Not At Risk (10/17/2022)  Depression (PHQ2-9): Low Risk  (01/19/2021)  Tobacco Use: High Risk (10/17/2022)    Readmission Risk Interventions     No data to display

## 2022-10-19 NOTE — Progress Notes (Signed)
Subjective: History of Present Illness: 77 year old female with a history of hypertension, CVA, tobacco use, alcohol use presented with 45-month history of unintentional 30 pound weight loss, shortness of breath, tremors.  Was found to have acute renal insufficiency thought to be secondary to poor oral intake and diuretics.  However, she was found to have moderate left-sided hydronephrosis.  She has been emptying her bladder well.  Baseline creatinine is around 1.  Creatinine upon presentation was 2.  Improved with some fluids today.  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  No right-sided hydronephrosis.  No renal or ureteral calculi.  Patient denies any recurrent UTI, flank pain.   5/8: No acute events overnight.  First time meeting patient today.  She was resting comfortably in bed and in good spirits.  She had no specific urologic complaints and no pain to speak of on rounds.  We again reviewed her kidney dysfunction and shared decision was made to watchfully wait.  Objective: Vital signs in last 24 hours: Temp:  [97.7 F (36.5 C)-98.3 F (36.8 C)] 97.7 F (36.5 C) (05/08 0513) Pulse Rate:  [73-80] 73 (05/08 0513) Resp:  [16-20] 16 (05/08 0513) BP: (124-143)/(62-72) 128/63 (05/08 1020) SpO2:  [98 %-100 %] 99 % (05/08 0819)  Intake/Output from previous day: 05/07 0701 - 05/08 0700 In: 1449.4 [P.O.:456; I.V.:993.4] Out: 695 [Urine:695]  Intake/Output this shift: Total I/O In: 236 [P.O.:236] Out: 125 [Urine:125]  Physical Exam:  General: Alert and oriented CV: No cyanosis Lungs: equal chest rise Abdomen: Soft, NTND, no rebound or guarding   Lab Results: Recent Labs    10/17/22 1416 10/18/22 0537  HGB 11.8* 10.0*  HCT 36.4 30.2*   BMET Recent Labs    10/18/22 0537 10/19/22 0528  NA 135 135  K 4.1 4.4  CL 107 109  CO2 19*  19*  GLUCOSE 90 117*  BUN 53* 52*  CREATININE 1.64* 1.54*  CALCIUM 8.7* 8.5*     Studies/Results: VAS [place] LOWER EXTREMITY VENOUS (DVT)  Result Date: 10/18/2022  Lower Venous DVT Study Patient Name:  PAYSON MADAR  Date of Exam:   10/18/2022 Medical Rec #: [id number]            Accession #:    [id number] Date of Birth: 06-07-1946             Patient Gender: F Patient Age:   65 years Exam Location:  [facility] Procedure:      VAS [place] LOWER EXTREMITY VENOUS (DVT) Referring Phys: Jeoffrey Massed --------------------------------------------------------------------------------  Indications: Elevated D Dimer.  Risk Factors: None identified. Limitations: Body habitus. Comparison Study: No prior studies. Performing Technologist: Chanda Busing RVT  Examination Guidelines: A complete evaluation includes B-mode imaging, spectral Doppler, color Doppler, and power Doppler as needed of all accessible portions of each vessel. Bilateral testing is considered an integral part of a complete examination. Limited examinations for reoccurring indications may be performed as noted. The reflux portion of the exam is performed with the patient in reverse Trendelenburg.  +---------+---------------+---------+-----------+----------+--------------+ RIGHT    CompressibilityPhasicitySpontaneityPropertiesThrombus Aging +---------+---------------+---------+-----------+----------+--------------+ CFV      Full           Yes      Yes                                 +---------+---------------+---------+-----------+----------+--------------+  SFJ      Full                                                        +---------+---------------+---------+-----------+----------+--------------+ FV Prox  Full                                                        +---------+---------------+---------+-----------+----------+--------------+ FV Mid   Full                                                         +---------+---------------+---------+-----------+----------+--------------+ FV DistalFull                                                        +---------+---------------+---------+-----------+----------+--------------+ PFV      Full                                                        +---------+---------------+---------+-----------+----------+--------------+ POP      Full           Yes      Yes                                 +---------+---------------+---------+-----------+----------+--------------+ PTV      Full                                                        +---------+---------------+---------+-----------+----------+--------------+ PERO     Full                                                        +---------+---------------+---------+-----------+----------+--------------+   +---------+---------------+---------+-----------+----------+--------------+ LEFT     CompressibilityPhasicitySpontaneityPropertiesThrombus Aging +---------+---------------+---------+-----------+----------+--------------+ CFV      Full           Yes      Yes                                 +---------+---------------+---------+-----------+----------+--------------+ SFJ      Full                                                        +---------+---------------+---------+-----------+----------+--------------+  FV Prox  Full                                                        +---------+---------------+---------+-----------+----------+--------------+ FV Mid   Full                                                        +---------+---------------+---------+-----------+----------+--------------+ FV DistalFull                                                        +---------+---------------+---------+-----------+----------+--------------+ PFV      Full                                                         +---------+---------------+---------+-----------+----------+--------------+ POP      Full           Yes      Yes                                 +---------+---------------+---------+-----------+----------+--------------+ PTV      Full                                                        +---------+---------------+---------+-----------+----------+--------------+ PERO     Full                                                        +---------+---------------+---------+-----------+----------+--------------+     Summary: RIGHT: - There is no evidence of deep vein thrombosis in the lower extremity.  - No cystic structure found in the popliteal fossa.  LEFT: - There is no evidence of deep vein thrombosis in the lower extremity.  - No cystic structure found in the popliteal fossa.  *See table(s) above for measurements and observations. Electronically signed by Lemar Livings MD on 10/18/2022 at 5:52:40 PM.    Final    CT RENAL STONE STUDY  Result Date: 10/18/2022 CLINICAL DATA:  Right flank pain. EXAM: CT ABDOMEN AND PELVIS WITHOUT CONTRAST TECHNIQUE: Multidetector CT imaging of the abdomen and pelvis was performed following the standard protocol without IV contrast. RADIATION DOSE REDUCTION: This exam was performed according to the departmental dose-optimization program which includes automated exposure control, adjustment of the mA and/or kV according to patient size and/or use of iterative reconstruction technique. COMPARISON:  Renal ultrasound yesterday FINDINGS: Lower chest: Dense mitral annulus calcifications. Small left and trace right pleural effusions with adjacent atelectasis.  Remote bilateral lower rib fractures. Hepatobiliary: Unremarkable unenhanced appearance of the liver. Gallbladder physiologically distended, no calcified stone. No biliary dilatation. Pancreas: Suboptimally assessed in the absence of contrast and paucity of intra-abdominal fat. No ductal dilatation or inflammation.  Spleen: Normal in size without focal abnormality. Adrenals/Urinary Tract: No adrenal nodule. There is no right hydronephrosis or renal stone. No evidence of focal renal abnormality of the right kidney. There is moderate left hydronephrosis. Transition from dilated to nondilated at the ureteropelvic junction. No left-sided stone. Partially distended urinary bladder. No bladder stone. Stomach/Bowel: Bowel assessment is significantly limited in the absence of contrast and paucity of intra-abdominal fat. There is a small hiatal hernia with wall thickening of the distal esophagus. Ingested material within the stomach which appears mildly dilated. Moderate volume of stool throughout the colon. The appendix is not definitively seen. Listed history of appendectomy. Vascular/Lymphatic: Advanced aortic and branch atherosclerosis. Coarse aortic calcifications in the distal aorta likely cause greater than 50% luminal stenosis. Limited assessment for adenopathy in the absence of contrast and paucity of intra-abdominal fat. No obvious bulky adenopathy. Reproductive: Suboptimal assessment of the uterus and adnexa. Other: No large volume ascites or free intra-abdominal air. Minimal air within the anterior abdominal wall typical of medication injection sites. Musculoskeletal: Lumbar scoliosis and diffuse degenerative change. There are no acute or suspicious osseous abnormalities. IMPRESSION: 1. Moderate left hydronephrosis with transition from dilated to nondilated at the ureteropelvic junction. No left-sided stone or cause of obstruction is seen. 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 2. No right hydronephrosis or stone to explain right flank pain. 3. Small hiatal hernia with wall thickening of the distal esophagus, suggesting reflux or esophagitis. 4. Small left and trace right pleural effusions with adjacent atelectasis. Aortic Atherosclerosis (ICD10-I70.0). Electronically Signed   By: Narda Rutherford M.D.   On:  10/18/2022 15:22   NM Pulmonary Perfusion  Result Date: 10/18/2022 CLINICAL DATA:  Positive D-dimer.  Concern for pulmonary embolism. EXAM: NUCLEAR MEDICINE PERFUSION LUNG SCAN TECHNIQUE: Perfusion images were obtained in multiple projections after intravenous injection of radiopharmaceutical. RADIOPHARMACEUTICALS:  4.1 mCi Tc-82m MAA COMPARISON:  Radiograph 10/17/2022 FINDINGS: No wedge-shaped peripheral perfusion defects within LEFT or RIGHT lung. Poor perfusion to the upper lobes the regional pattern. IMPRESSION: No evidence of acute pulmonary embolism. 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 Electronically Signed   By: Genevive Bi M.D.   On: 10/18/2022 11:16   US RENAL  Result Date: 10/17/2022 CLINICAL DATA:  Acute kidney injury EXAM: RENAL / URINARY TRACT ULTRASOUND COMPLETE COMPARISON:  None Available. FINDINGS: Right Kidney: Renal measurements: 8.2 x 3.7 x 4.4 cm = volume: 70 mL. Echogenicity within normal limits. No mass or hydronephrosis visualized. Left Kidney: Renal measurements: 9.6 x 4.9 x 4.5 cm = volume: 112 mL. Renal cortical thickness is preserved and cortical echogenicity is normal. There is moderate hydronephrosis. No intrarenal masses or calcifications are seen. Bladder: Appears normal for degree of bladder distention. Bilateral ureteral jets are identified Other: None. IMPRESSION: 1. Moderate left hydronephrosis. Electronically Signed   By: Helyn Numbers M.D.   On: 10/17/2022 21:13   DG Chest 2 View  Result Date: 10/17/2022 CLINICAL DATA:  Shortness of breath EXAM: CHEST - 2 VIEW COMPARISON:  X-ray 09/30/2013.  CT 03/01/2019 FINDINGS: Hyperinflation. No consolidation or pneumothorax. No edema. Normal cardiopericardial silhouette. Calcified aorta. Curvature and degenerative changes along the spine. On the lateral view there is some blunting of the inferior costophrenic angles. Tiny effusion versus pleural thickening. Mitral  valve annular calcification. IMPRESSION:  Hyperinflation with chronic changes. Tiny effusion versus pleural thickening. Electronically Signed   By: Karen Kays M.D.   On: 10/17/2022 15:33    Assessment/Plan: Management of UPJ obstructions are generally temporizing significant flank pain and AKI.  Thankfully patient's renal indices are improving and she has no reportable pain.  As long as her labs continue moving in the right direction, there is no indication for immediate surgical invention.  Stented UPJ obstructions tend to get edematous and reobstructed on removal of the stent, locking the patient into a perpetual cycle of ureteral stent.  Will discuss long-term stent placement versus percutaneous nephrostomy tube should she have any acute change.  Scr 1.99-->1.64-->1.54.  Consistent improvement.  Continue to follow daily labs.  Urology will follow along peripherally   LOS: 2 days   Elmon Kirschner, NP [facility] Pager: [phone]  10/19/2022, 11:43 AM

## 2022-10-20 ENCOUNTER — Other Ambulatory Visit (HOSPITAL_COMMUNITY): Payer: Self-pay

## 2022-10-20 ENCOUNTER — Telehealth: Payer: Self-pay

## 2022-10-20 DIAGNOSIS — J449 Chronic obstructive pulmonary disease, unspecified: Secondary | ICD-10-CM

## 2022-10-20 DIAGNOSIS — I2510 Atherosclerotic heart disease of native coronary artery without angina pectoris: Secondary | ICD-10-CM

## 2022-10-20 DIAGNOSIS — N179 Acute kidney failure, unspecified: Secondary | ICD-10-CM | POA: Diagnosis not present

## 2022-10-20 DIAGNOSIS — I10 Essential (primary) hypertension: Secondary | ICD-10-CM

## 2022-10-20 LAB — BASIC METABOLIC PANEL
Anion gap: 6 (ref 5–15)
BUN: 51 mg/dL — ABNORMAL HIGH (ref 8–23)
CO2: 20 mmol/L — ABNORMAL LOW (ref 22–32)
Calcium: 8.7 mg/dL — ABNORMAL LOW (ref 8.9–10.3)
Chloride: 109 mmol/L (ref 98–111)
Creatinine, Ser: 1.37 mg/dL — ABNORMAL HIGH (ref 0.44–1.00)
GFR, Estimated: 40 mL/min — ABNORMAL LOW (ref >60)
Glucose, Bld: 98 mg/dL (ref 70–99)
Potassium: 3.9 mmol/L (ref 3.5–5.1)
Sodium: 135 mmol/L (ref 135–145)

## 2022-10-20 MED ORDER — AMLODIPINE BESYLATE 5 MG PO TABS
5.0000 mg | ORAL_TABLET | Freq: Every day | ORAL | 0 refills | Status: AC
  Filled 2022-10-20: qty 30, 30d supply, fill #0

## 2022-10-20 MED ORDER — SPIRIVA RESPIMAT 2.5 MCG/ACT IN AERS
INHALATION_SPRAY | RESPIRATORY_TRACT | 0 refills | Status: DC
  Filled 2022-10-20: qty 4, 30d supply, fill #0

## 2022-10-20 MED ORDER — PREDNISONE 10 MG PO TABS
20.0000 mg | ORAL_TABLET | Freq: Every day | ORAL | 0 refills | Status: AC
  Filled 2022-10-20: qty 4, 2d supply, fill #0

## 2022-10-20 MED ORDER — ALBUTEROL SULFATE HFA 108 (90 BASE) MCG/ACT IN AERS
2.0000 | INHALATION_SPRAY | Freq: Four times a day (QID) | RESPIRATORY_TRACT | 0 refills | Status: DC | PRN
  Filled 2022-10-20: qty 6.7, 25d supply, fill #0

## 2022-10-20 NOTE — Discharge Summary (Signed)
Physician Discharge Summary  Crystal Noble JXB:[id number] DOB: [birth year] DOA: 10/17/2022  PCP: Lewis Moccasin, MD  Admit date: 10/17/2022 Discharge date: 10/20/2022  Admitted From: home Disposition:  home  Recommendations for Outpatient Follow-up:  Follow up with PCP in 1-2 weeks Please obtain BMP/CBC in one week  Home Health: PT, OT, aide, SW Equipment/Devices: walker  Discharge Condition: stable CODE STATUS: Full code Diet Orders (From admission, onward)     Start     Ordered   10/18/22 1143  Diet regular Room service appropriate? Yes; Fluid consistency: Thin  Diet effective now       Question Answer Comment  Room service appropriate? Yes   Fluid consistency: Thin      10/18/22 1143            HPI: Per admitting MD, ASHIAH LOCICERO is a 77 y.o. female with medical history significant of history of hypertension, CVA, anxiety/depression, tobacco use, alcohol use who was sent from her primary care practitioner's office to the ED for evaluation of 38-month history of unintentional 30 pound weight loss, shortness of breath, tremors. Please note-patient is a poor historian-it takes multiple attempts to elicit her history. Apparently family history of pain-she has had exertional dyspnea-for almost 6 months.  There is no orthopnea, leg edema.  She appears comfortable at rest.  For the past several months-she has had mostly intentional tremors that have worsened.  She does not have tremors at rest.  During this time she has had approximately 30 pound unintentional weight loss.  She claims that her appetite is very minimal and that she has hardly been eating. There is no history of nausea, vomiting or diarrhea.  There is no history of chest pain or headache.  There is no history of hematuria. She was evaluated in the ED and found to have AKI-given her longstanding history of smoking-she was thought to have COPD exacerbation-TRH service was asked to admit this patient for further  evaluation and treatment.  Hospital Course / Discharge diagnoses: Principal problem Acute kidney injury -this is likely in the setting of poor oral intake as well as using home diuretics.  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  Urology consulted, without any flank pain, fevers, urinary retention did not recommend any interventions.  Creatinine improved at her baseline with fluids. Hold HCTZ/Cozaar given poor p.o. 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 VQ scan negative for PE, lower extremity Dopplers negative for DVT.  CT scan shows emphysematous changes.  Patient will be started on Spiriva as well as albuterol.  Recommend outpatient follow-up referral to pulmonology, PFTs, tobacco cessation  Cachexia, weight loss -patient initially told me that she has lost 30 pounds over the last 6 months.  She later retracted that, thinking that she lost this much weight about 2 years ago when she had a right acetabular fracture.  It was managed nonoperatively.  She also tells me that it is becoming increasingly difficult for her to get to the grocery store but usually makes it once a week.  Screening CT of the chest abdomen pelvis was negative for any obvious malignancy.  There may be the currently a social component to her weight loss given her decreasing mobility and inability for her to do shopping when she runs  out of food.  Case manager involved, home health PT/OT/aide/SW arranged.  She will also be given information regarding Meals on Wheels.  Patient states that she has no family Hypertension - Continue with Norvasc.  Hold HCTZ/valsartan Anxiety depression -Continue with Lexapro and Abilify.  She does deny however that she has anxiety or depression Tremors -improved EtOH - used to  drink 1 bottle of wine daily, has cut down to 1 shot of brandy daily.  Does not appear to be withdrawing, recommend full cessation  Tobacco use - Counseling regarding cessation  Sepsis ruled out   Discharge Instructions   Allergies as of 10/20/2022       Reactions   Other Other (See Comments)   "Exacerbates my collagenous colitis"   Penicillin G Rash        Medication List     STOP taking these medications    valsartan-hydrochlorothiazide 320-25 MG tablet Commonly known as: DIOVAN-HCT       TAKE these medications    albuterol 108 (90 Base) MCG/ACT inhaler Commonly known as: VENTOLIN HFA Inhale 2 puffs into the lungs every 6 (six) hours as needed for wheezing or shortness of breath.   amLODipine 5 MG tablet Commonly known as: NORVASC Take 1 tablet (5 mg total) by mouth daily. What changed:  medication strength how much to take   ARIPiprazole 20 MG tablet Commonly known as: ABILIFY Take 20 mg by mouth daily.   busPIRone 10 MG tablet Commonly known as: BUSPAR Take 20 mg by mouth daily.   escitalopram 20 MG tablet Commonly known as: LEXAPRO Take 20 mg by mouth daily.   predniSONE 10 MG tablet Commonly known as: DELTASONE Take 2 tablets (20 mg total) by mouth daily for 2 days. Start taking on: Oct 21, 2022   rosuvastatin 10 MG tablet Commonly known as: CRESTOR Take 10 mg by mouth at bedtime.   tiotropium 18 MCG inhalation capsule Commonly known as: Spiriva HandiHaler Place 1 capsule (18 mcg total) into inhaler and inhale daily.   VITAMIN D3 ULTRA STRENGTH PO Take 1 tablet by mouth daily.               Durable Medical Equipment  (From admission, onward)           Start     Ordered   10/19/22 1114  For home use only DME Other see comment  Once       Comments: Needs shower chair  Question:  Length of Need  Answer:  Lifetime   10/19/22 1113            Follow-up Information     [facility] Follow up.   Specialty: Home  Health Services Why: Someone from [facility] will contact you to arrange start date and time for your therapy. Contact information: [address] [place] [address] [phone]         Lewis Moccasin, MD Follow up in 1 week(s).   Specialty: Family Medicine Contact information: [address] [place] [address] [phone]                 Consultations: none  Procedures/Studies:  CT CHEST ABDOMEN PELVIS WO CONTRAST  Result Date: 10/19/2022 CLINICAL DATA:  Unintended 30 pound weight loss. EXAM: CT CHEST, ABDOMEN AND PELVIS WITHOUT CONTRAST TECHNIQUE: Multidetector CT imaging of the chest, abdomen and pelvis was performed following the standard protocol without IV contrast. RADIATION DOSE REDUCTION: This exam was performed  according to the departmental dose-optimization program which includes automated exposure control, adjustment of the mA and/or kV according to patient size and/or use of iterative reconstruction technique. COMPARISON:  Noncontrast CT abdomen pelvis 10/18/2022. Chest CT scan 03/01/2019. FINDINGS: CT CHEST FINDINGS Cardiovascular: On this non IV contrast exam, there is a trace pericardial fluid versus thickening. This appears slightly increased from September 2020. Coronary artery calcifications are seen. There is calcifications along the mitral valve annulus. The thoracic aorta also has extensive vascular calcifications including along the origin of the great vessels with potential areas of stenosis. Particularly the brachiocephalic and subclavian arteries. If needed further workup with CT angiogram or ultrasound as clinically appropriate. Mediastinum/Nodes: Mildly patulous esophagus. Stable thyroid gland. On this non IV contrast exam there is no specific abnormal lymph node enlargement identified in the axillary region, hilum or mediastinum. Only a few small less than 1 cm in size in short axis mediastinal nodes are seen, nonpathologic by  size criteria. Lungs/Pleura: Tiny pleural effusions. Mild dependent atelectasis. No consolidation or pneumothorax. Centrilobular emphysematous lung changes are identified. No dominant lung mass. Musculoskeletal: Curvature and degenerative changes along the spine. Osteopenia. Bilateral posterior rib fractures are seen. Some of these appear more chronic. Please correlate with the history. CT ABDOMEN PELVIS FINDINGS Hepatobiliary: On this non IV contrast exam, grossly the liver is preserved. There is faint tiny low-attenuation focus identified in the left hepatic lobe such as series 2, image 52 measuring 6 mm. These are too small to completely characterize. Overall limited evaluation without the advantage of IV contrast. Gallbladder is nondilated. Pancreas: Unremarkable. No pancreatic ductal dilatation or surrounding inflammatory changes. Spleen: Splenic granuloma.  The spleen is nonenlarged. Adrenals/Urinary Tract: The adrenal glands are grossly preserved. Punctate nonobstructing upper pole right-sided renal stone. Minimal ectasia of the left renal collecting system, decreased from prior. Please correlate with history. The bladder is nondilated. Stomach/Bowel: Moderate debris in the stomach. Stomach is mildly distended. Small-bowel has some fluid-filled loops in the midportion with significant wall thickening and adjacent mesenteric stranding. The luminal fluid and distention is increased from previous. The wall thickening is better seen today with more luminal fluid. Colon has some scattered stool but is nondilated. Sigmoid colon diverticulosis. Vascular/Lymphatic: Diffuse vascular calcifications identified. Areas of significant stenosis are suggested along the aorta, iliac vessels and separate branch vessels. No obvious lymph node enlargement on this noncontrast examination. Reproductive: Limited evaluation. The uterus is present. No obvious adnexal mass on this noncontrast exam Other: Diffuse mesenteric haziness  with trace fluid. Anasarca. No obvious free air. There is some air along the anterior abdominal subcutaneous fat. Please correlate for etiology including injection sites. Musculoskeletal: Curvature of the lumbar spine. Moderate degenerative changes of the spine and pelvis. IMPRESSION: Evaluation for solid organ pathology including malignancy is significantly limited without the advantage of contrast. Tiny pleural effusions.  Emphysematous lung changes. No thoracic obvious mass lesion or lymph node enlargement. Wall thickening seen along several loops of small bowel in the midabdomen. These are more fluid-filled today. There is also diffuse mesenteric haziness. This has a broad differential. Please correlate for any infectious or inflammatory process. No obstruction or free air. Colonic diverticulosis. Tiny low-attenuation liver lesions under a cm. These are indeterminate but could be small cysts. Extensive vascular calcifications identified with areas of significant stenosis. This includes the aorta, great vessels, iliac vessels and several abdominal branch vessels. Improving left-sided renal collecting system dilatation. No obvious left-sided renal stone. Punctate nonobstructing right-sided renal stone. Overall with appearance in the  patient's history, additional contrast study may be of benefit as clinically appropriate Electronically Signed   By: Karen Kays M.D.   On: 10/19/2022 16:40   VAS [place] LOWER EXTREMITY VENOUS (DVT)  Result Date: 10/18/2022  Lower Venous DVT Study Patient Name:  SHELISE CABANILLAS  Date of Exam:   10/18/2022 Medical Rec #: [id number]            Accession #:    [id number] Date of Birth: 1945-11-05             Patient Gender: F Patient Age:   31 years Exam Location:  [facility] Procedure:      VAS [place] LOWER EXTREMITY VENOUS (DVT) Referring Phys: Jeoffrey Massed --------------------------------------------------------------------------------  Indications: Elevated D Dimer.  Risk  Factors: None identified. Limitations: Body habitus. Comparison Study: No prior studies. Performing Technologist: Chanda Busing RVT  Examination Guidelines: A complete evaluation includes B-mode imaging, spectral Doppler, color Doppler, and power Doppler as needed of all accessible portions of each vessel. Bilateral testing is considered an integral part of a complete examination. Limited examinations for reoccurring indications may be performed as noted. The reflux portion of the exam is performed with the patient in reverse Trendelenburg.  +---------+---------------+---------+-----------+----------+--------------+ RIGHT    CompressibilityPhasicitySpontaneityPropertiesThrombus Aging +---------+---------------+---------+-----------+----------+--------------+ CFV      Full           Yes      Yes                                 +---------+---------------+---------+-----------+----------+--------------+ SFJ      Full                                                        +---------+---------------+---------+-----------+----------+--------------+ FV Prox  Full                                                        +---------+---------------+---------+-----------+----------+--------------+ FV Mid   Full                                                        +---------+---------------+---------+-----------+----------+--------------+ FV DistalFull                                                        +---------+---------------+---------+-----------+----------+--------------+ PFV      Full                                                        +---------+---------------+---------+-----------+----------+--------------+ POP      Full           Yes  Yes                                 +---------+---------------+---------+-----------+----------+--------------+ PTV      Full                                                         +---------+---------------+---------+-----------+----------+--------------+ PERO     Full                                                        +---------+---------------+---------+-----------+----------+--------------+   +---------+---------------+---------+-----------+----------+--------------+ LEFT     CompressibilityPhasicitySpontaneityPropertiesThrombus Aging +---------+---------------+---------+-----------+----------+--------------+ CFV      Full           Yes      Yes                                 +---------+---------------+---------+-----------+----------+--------------+ SFJ      Full                                                        +---------+---------------+---------+-----------+----------+--------------+ FV Prox  Full                                                        +---------+---------------+---------+-----------+----------+--------------+ FV Mid   Full                                                        +---------+---------------+---------+-----------+----------+--------------+ FV DistalFull                                                        +---------+---------------+---------+-----------+----------+--------------+ PFV      Full                                                        +---------+---------------+---------+-----------+----------+--------------+ POP      Full           Yes      Yes                                 +---------+---------------+---------+-----------+----------+--------------+ PTV      Full                                                        +---------+---------------+---------+-----------+----------+--------------+  PERO     Full                                                        +---------+---------------+---------+-----------+----------+--------------+     Summary: RIGHT: - There is no evidence of deep vein thrombosis in the lower extremity.  - No cystic structure found in  the popliteal fossa.  LEFT: - There is no evidence of deep vein thrombosis in the lower extremity.  - No cystic structure found in the popliteal fossa.  *See table(s) above for measurements and observations. Electronically signed by Lemar Livings MD on 10/18/2022 at 5:52:40 PM.    Final    CT RENAL STONE STUDY  Result Date: 10/18/2022 CLINICAL DATA:  Right flank pain. EXAM: CT ABDOMEN AND PELVIS WITHOUT CONTRAST TECHNIQUE: Multidetector CT imaging of the abdomen and pelvis was performed following the standard protocol without IV contrast. RADIATION DOSE REDUCTION: This exam was performed according to the departmental dose-optimization program which includes automated exposure control, adjustment of the mA and/or kV according to patient size and/or use of iterative reconstruction technique. COMPARISON:  Renal ultrasound yesterday FINDINGS: Lower chest: Dense mitral annulus calcifications. Small left and trace right pleural effusions with adjacent atelectasis. Remote bilateral lower rib fractures. Hepatobiliary: Unremarkable unenhanced appearance of the liver. Gallbladder physiologically distended, no calcified stone. No biliary dilatation. Pancreas: Suboptimally assessed in the absence of contrast and paucity of intra-abdominal fat. No ductal dilatation or inflammation. Spleen: Normal in size without focal abnormality. Adrenals/Urinary Tract: No adrenal nodule. There is no right hydronephrosis or renal stone. No evidence of focal renal abnormality of the right kidney. There is moderate left hydronephrosis. Transition from dilated to nondilated at the ureteropelvic junction. No left-sided stone. Partially distended urinary bladder. No bladder stone. Stomach/Bowel: Bowel assessment is significantly limited in the absence of contrast and paucity of intra-abdominal fat. There is a small hiatal hernia with wall thickening of the distal esophagus. Ingested material within the stomach which appears mildly dilated. Moderate  volume of stool throughout the colon. The appendix is not definitively seen. Listed history of appendectomy. Vascular/Lymphatic: Advanced aortic and branch atherosclerosis. Coarse aortic calcifications in the distal aorta likely cause greater than 50% luminal stenosis. Limited assessment for adenopathy in the absence of contrast and paucity of intra-abdominal fat. No obvious bulky adenopathy. Reproductive: Suboptimal assessment of the uterus and adnexa. Other: No large volume ascites or free intra-abdominal air. Minimal air within the anterior abdominal wall typical of medication injection sites. Musculoskeletal: Lumbar scoliosis and diffuse degenerative change. There are no acute or suspicious osseous abnormalities. IMPRESSION: 1. Moderate left hydronephrosis with transition from dilated to nondilated at the ureteropelvic junction. No left-sided stone or cause of obstruction is seen. 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 2. No right hydronephrosis or stone to explain right flank pain. 3. Small hiatal hernia with wall thickening of the distal esophagus, suggesting reflux or esophagitis. 4. Small left and trace right pleural effusions with adjacent atelectasis. Aortic Atherosclerosis (ICD10-I70.0). Electronically Signed   By: Narda Rutherford M.D.   On: 10/18/2022 15:22   NM Pulmonary Perfusion  Result Date: 10/18/2022 CLINICAL DATA:  Positive D-dimer.  Concern for pulmonary embolism. EXAM: NUCLEAR MEDICINE PERFUSION LUNG SCAN TECHNIQUE: Perfusion images were obtained in multiple projections after intravenous injection of radiopharmaceutical. RADIOPHARMACEUTICALS:  4.1 mCi Tc-35m MAA COMPARISON:  Radiograph 10/17/2022 FINDINGS: No wedge-shaped peripheral perfusion defects within LEFT or RIGHT lung. Poor perfusion to the upper lobes the regional pattern. IMPRESSION: No evidence of acute pulmonary embolism. 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 Electronically Signed   By: Genevive Bi  M.D.   On: 10/18/2022 11:16   US RENAL  Result Date: 10/17/2022 CLINICAL DATA:  Acute kidney injury EXAM: RENAL / URINARY TRACT ULTRASOUND COMPLETE COMPARISON:  None Available. FINDINGS: Right Kidney: Renal measurements: 8.2 x 3.7 x 4.4 cm = volume: 70 mL. Echogenicity within normal limits. No mass or hydronephrosis visualized. Left Kidney: Renal measurements: 9.6 x 4.9 x 4.5 cm = volume: 112 mL. Renal cortical thickness is preserved and cortical echogenicity is normal. There is moderate hydronephrosis. No intrarenal masses or calcifications are seen. Bladder: Appears normal for degree of bladder distention. Bilateral ureteral jets are identified Other: None. IMPRESSION: 1. Moderate left hydronephrosis. Electronically Signed   By: Helyn Numbers M.D.   On: 10/17/2022 21:13   DG Chest 2 View  Result Date: 10/17/2022 CLINICAL DATA:  Shortness of breath EXAM: CHEST - 2 VIEW COMPARISON:  X-ray 09/30/2013.  CT 03/01/2019 FINDINGS: Hyperinflation. No consolidation or pneumothorax. No edema. Normal cardiopericardial silhouette. Calcified aorta. Curvature and degenerative changes along the spine. On the lateral view there is some blunting of the inferior costophrenic angles. Tiny effusion versus pleural thickening. Mitral valve annular calcification. IMPRESSION: Hyperinflation with chronic changes. Tiny effusion versus pleural thickening. Electronically Signed   By: Karen Kays M.D.   On: 10/17/2022 15:33    Subjective: - no chest pain, shortness of breath, no abdominal pain, nausea or vomiting.   Discharge Exam: BP (!) 119/57 (BP Location: Left Arm)   Pulse 70   Temp (!) 97.5 F (36.4 C) (Oral)   Resp 15   Ht 5' (1.524 m)   Wt 31.3 kg   SpO2 98%   BMI 13.48 kg/m   General: Pt is alert, awake, not in acute distress Cardiovascular: RRR, S1/S2 +, no rubs, no gallops Respiratory: CTA bilaterally, no wheezing, no rhonchi Abdominal: Soft, NT, ND, bowel sounds + Extremities: no edema, no  cyanosis   The results of significant diagnostics from this hospitalization (including imaging, microbiology, ancillary and laboratory) are listed below for reference.     Microbiology: Recent Results (from the past 240 hour(s))  SARS Coronavirus 2 by RT PCR (hospital order, performed in [facility] hospital lab) *cepheid single result test* Anterior Nasal Swab     Status: None   Collection Time: 10/17/22  2:27 PM   Specimen: Anterior Nasal Swab  Result Value Ref Range Status   SARS Coronavirus 2 by RT PCR NEGATIVE NEGATIVE Final    Comment: (NOTE) SARS-CoV-2 target nucleic acids are NOT DETECTED.  The SARS-CoV-2 RNA is generally detectable in upper and lower respiratory specimens during the acute phase of infection. The lowest concentration of SARS-CoV-2 viral copies this assay can detect is 250 copies / mL. A negative result does not preclude SARS-CoV-2 infection and should not be used as the sole basis for treatment or other patient management decisions.  A negative result may occur with improper specimen collection / handling, submission of specimen other than nasopharyngeal swab, presence of viral mutation(s) within the areas targeted by this assay, and inadequate number of viral copies (<250 copies / mL). A negative result must be combined with clinical observations, patient history, and epidemiological information.  Fact Sheet for Patients:   RoadLapTop.co.za  Fact Sheet for Healthcare Providers: http://kim-miller.com/  This test is not yet approved or  cleared by the [place] and has been authorized for detection and/or diagnosis of SARS-CoV-2 by FDA under an Emergency Use Authorization (EUA).  This EUA will remain in effect (meaning this test can be used) for the duration of the COVID-19 declaration under Section 564(b)(1) of the Act, 21 U.S.C. section 360bbb-3(b)(1), unless the authorization is terminated  or revoked sooner.  Performed at [facility], [address] [place], [place] [address]      Labs: Basic Metabolic Panel: Recent Labs  Lab 10/17/22 1416 10/18/22 0537 10/19/22 0528 10/20/22 0829  NA 137 135 135 135  K 4.7 4.1 4.4 3.9  CL 107 107 109 109  CO2 21* 19* 19* 20*  GLUCOSE 101* 90 117* 98  BUN 73* 53* 52* 51*  CREATININE 1.99* 1.64* 1.54* 1.37*  CALCIUM 9.4 8.7* 8.5* 8.7*  MG  --   --  1.7  --   PHOS  --   --  4.2  --    Liver Function Tests: Recent Labs  Lab 10/17/22 1416 10/18/22 0537  AST 27 27  ALT 29 24  ALKPHOS 68 53  BILITOT 0.6 0.9  PROT 7.1 5.7*  ALBUMIN 4.4 3.5   CBC: Recent Labs  Lab 10/17/22 1416 10/18/22 0537  WBC 6.4 5.9  NEUTROABS 4.9  --   HGB 11.8* 10.0*  HCT 36.4 30.2*  MCV 97.6 99.7  PLT 252 199   CBG: No results for input(s): "GLUCAP" in the last 168 hours. Hgb A1c Recent Labs    10/18/22 0537  HGBA1C 5.9*   Lipid Profile No results for input(s): "CHOL", "HDL", "LDLCALC", "TRIG", "CHOLHDL", "LDLDIRECT" in the last 72 hours. Thyroid function studies Recent Labs    10/18/22 0537  TSH 0.810   Urinalysis    Component Value Date/Time   COLORURINE YELLOW 10/17/2022 1717   APPEARANCEUR CLEAR 10/17/2022 1717   LABSPEC 1.011 10/17/2022 1717   PHURINE 5.0 10/17/2022 1717   GLUCOSEU NEGATIVE 10/17/2022 1717   HGBUR NEGATIVE 10/17/2022 1717   BILIRUBINUR NEGATIVE 10/17/2022 1717   KETONESUR NEGATIVE 10/17/2022 1717   PROTEINUR NEGATIVE 10/17/2022 1717   NITRITE NEGATIVE 10/17/2022 1717   LEUKOCYTESUR TRACE (A) 10/17/2022 1717    FURTHER DISCHARGE INSTRUCTIONS:   Get Medicines reviewed and adjusted: Please take all your medications with you for your next visit with your Primary MD   Laboratory/radiological data: Please request your Primary MD to go over all hospital tests and procedure/radiological results at the follow up, please ask your Primary MD to get all Hospital records sent to  his/her office.   In some cases, they will be blood work, cultures and biopsy results pending at the time of your discharge. Please request that your primary care M.D. goes through all the records of your hospital data and follows up on these results.   Also Note the following: If you experience worsening of your admission symptoms, develop shortness of breath, life threatening emergency, suicidal or homicidal thoughts you must seek medical attention immediately by calling 911 or calling your MD immediately  if symptoms less severe.   You must read complete instructions/literature along with all the possible adverse reactions/side effects for all the Medicines you take and that have been prescribed to you. Take any new Medicines after you have completely understood and accpet all the possible adverse reactions/side effects.    Do not drive when taking Pain medications or sleeping medications (Benzodaizepines)   Do not  take more than prescribed Pain, Sleep and Anxiety Medications. It is not advisable to combine anxiety,sleep and pain medications without talking with your primary care practitioner   Special Instructions: If you have smoked or chewed Tobacco  in the last 2 yrs please stop smoking, stop any regular Alcohol  and or any Recreational drug use.   Wear Seat belts while driving.   Please note: You were cared for by a hospitalist during your hospital stay. Once you are discharged, your primary care physician will handle any further medical issues. Please note that NO REFILLS for any discharge medications will be authorized once you are discharged, as it is imperative that you return to your primary care physician (or establish a relationship with a primary care physician if you do not have one) for your post hospital discharge needs so that they can reassess your need for medications and monitor your lab values.  Time coordinating discharge: 35 minutes  SIGNED:  Pamella Pert, MD,  PhD 10/20/2022, 9:49 AM

## 2022-10-21 ENCOUNTER — Telehealth: Payer: Self-pay | Admitting: *Deleted

## 2022-10-21 NOTE — Progress Notes (Signed)
  Care Coordination   Note   10/21/2022 Name: CALEAH CERRILLO MRN: [id number] DOB: 1945/12/17  ZEANA DEYOUNG is a 77 y.o. year old female who sees Lewis Moccasin, MD for primary care. I reached out to [name] by phone today to offer care coordination services.  Ms. Krolak was given information about Care Coordination services today including:   The Care Coordination services include support from the care team which includes your Nurse Coordinator, Clinical Social Worker, or Pharmacist.  The Care Coordination team is here to help remove barriers to the health concerns and goals most important to you. Care Coordination services are voluntary, and the patient may decline or stop services at any time by request to their care team member.   Care Coordination Consent Status: Patient agreed to services and verbal consent obtained.   Follow up plan:  Telephone appointment with care coordination team member scheduled for:  SW 10/24/22 and [facility] 11/08/22   Encounter Outcome:  Pt. Scheduled  [facility]  Care Coordination Care Guide  Direct Dial: [phone]

## 2022-10-22 DIAGNOSIS — J9811 Atelectasis: Secondary | ICD-10-CM | POA: Diagnosis not present

## 2022-10-22 DIAGNOSIS — F419 Anxiety disorder, unspecified: Secondary | ICD-10-CM | POA: Diagnosis not present

## 2022-10-22 DIAGNOSIS — I739 Peripheral vascular disease, unspecified: Secondary | ICD-10-CM | POA: Diagnosis not present

## 2022-10-22 DIAGNOSIS — I2510 Atherosclerotic heart disease of native coronary artery without angina pectoris: Secondary | ICD-10-CM | POA: Diagnosis not present

## 2022-10-22 DIAGNOSIS — G3184 Mild cognitive impairment, so stated: Secondary | ICD-10-CM | POA: Diagnosis not present

## 2022-10-22 DIAGNOSIS — F32A Depression, unspecified: Secondary | ICD-10-CM | POA: Diagnosis not present

## 2022-10-22 DIAGNOSIS — J449 Chronic obstructive pulmonary disease, unspecified: Secondary | ICD-10-CM | POA: Diagnosis not present

## 2022-10-22 DIAGNOSIS — I700 Atherosclerosis of aorta: Secondary | ICD-10-CM | POA: Diagnosis not present

## 2022-10-22 DIAGNOSIS — I10 Essential (primary) hypertension: Secondary | ICD-10-CM | POA: Diagnosis not present

## 2022-10-24 ENCOUNTER — Ambulatory Visit: Payer: Self-pay | Admitting: Licensed Clinical Social Worker

## 2022-10-24 DIAGNOSIS — I2510 Atherosclerotic heart disease of native coronary artery without angina pectoris: Secondary | ICD-10-CM | POA: Diagnosis not present

## 2022-10-24 DIAGNOSIS — J449 Chronic obstructive pulmonary disease, unspecified: Secondary | ICD-10-CM | POA: Diagnosis not present

## 2022-10-24 DIAGNOSIS — I739 Peripheral vascular disease, unspecified: Secondary | ICD-10-CM | POA: Diagnosis not present

## 2022-10-24 DIAGNOSIS — I700 Atherosclerosis of aorta: Secondary | ICD-10-CM | POA: Diagnosis not present

## 2022-10-24 DIAGNOSIS — I10 Essential (primary) hypertension: Secondary | ICD-10-CM | POA: Diagnosis not present

## 2022-10-24 DIAGNOSIS — G3184 Mild cognitive impairment, so stated: Secondary | ICD-10-CM | POA: Diagnosis not present

## 2022-10-24 DIAGNOSIS — J9811 Atelectasis: Secondary | ICD-10-CM | POA: Diagnosis not present

## 2022-10-24 DIAGNOSIS — F419 Anxiety disorder, unspecified: Secondary | ICD-10-CM | POA: Diagnosis not present

## 2022-10-24 DIAGNOSIS — F32A Depression, unspecified: Secondary | ICD-10-CM | POA: Diagnosis not present

## 2022-10-25 DIAGNOSIS — J9811 Atelectasis: Secondary | ICD-10-CM | POA: Diagnosis not present

## 2022-10-25 DIAGNOSIS — R0602 Shortness of breath: Secondary | ICD-10-CM | POA: Diagnosis not present

## 2022-10-25 DIAGNOSIS — I700 Atherosclerosis of aorta: Secondary | ICD-10-CM | POA: Diagnosis not present

## 2022-10-25 DIAGNOSIS — N189 Chronic kidney disease, unspecified: Secondary | ICD-10-CM | POA: Diagnosis not present

## 2022-10-25 DIAGNOSIS — J449 Chronic obstructive pulmonary disease, unspecified: Secondary | ICD-10-CM | POA: Diagnosis not present

## 2022-10-25 DIAGNOSIS — F419 Anxiety disorder, unspecified: Secondary | ICD-10-CM | POA: Diagnosis not present

## 2022-10-25 DIAGNOSIS — I739 Peripheral vascular disease, unspecified: Secondary | ICD-10-CM | POA: Diagnosis not present

## 2022-10-25 DIAGNOSIS — G3184 Mild cognitive impairment, so stated: Secondary | ICD-10-CM | POA: Diagnosis not present

## 2022-10-25 DIAGNOSIS — K219 Gastro-esophageal reflux disease without esophagitis: Secondary | ICD-10-CM | POA: Diagnosis not present

## 2022-10-25 DIAGNOSIS — E46 Unspecified protein-calorie malnutrition: Secondary | ICD-10-CM | POA: Diagnosis not present

## 2022-10-25 DIAGNOSIS — I10 Essential (primary) hypertension: Secondary | ICD-10-CM | POA: Diagnosis not present

## 2022-10-25 DIAGNOSIS — F32A Depression, unspecified: Secondary | ICD-10-CM | POA: Diagnosis not present

## 2022-10-25 DIAGNOSIS — I2510 Atherosclerotic heart disease of native coronary artery without angina pectoris: Secondary | ICD-10-CM | POA: Diagnosis not present

## 2022-10-25 NOTE — Patient Instructions (Signed)
Visit Information  Thank you for taking time to visit with me today. Please don't hesitate to contact me if I can be of assistance to you.   Following are the goals we discussed today:   Goals Addressed             This Visit's Progress    LCSW Plan of Care-Obtain Supportive Resources   On track    Activities and task to complete in order to accomplish goals.   Keep all upcoming appointments discussed today Continue with compliance of taking medication prescribed by Doctor Implement healthy coping skills discussed to assist with management of symptoms         Our next appointment is by telephone on 6/10 at 11 AM  Please call the care guide team at [phone] if you need to cancel or reschedule your appointment.   If you are experiencing a Mental Health or Behavioral Health Crisis or need someone to talk to, please call the Suicide and Crisis Lifeline: 988 call 911   The patient verbalized understanding of instructions, educational materials, and care plan provided today and DECLINED offer to receive copy of patient instructions, educational materials, and care plan.   Jenel Lucks, MSW, LCSW [facility] Care Management [place]  [facility] [email or web address] Phone [phone] 6:11 PM

## 2022-10-25 NOTE — Patient Outreach (Signed)
  Care Coordination   Initial Visit Note   10/24/2022 Name: Crystal Noble MRN: [id number] DOB: 07/03/1945  Crystal Noble is a 77 y.o. year old female who sees Coy Rochford Moccasin, MD for primary care. I spoke with  [name] by phone today.  What matters to the patients health and wellness today?  Care Coordination    Goals Addressed             This Visit's Progress    LCSW Plan of Care-Obtain Supportive Resources   On track    Activities and task to complete in order to accomplish goals.   Keep all upcoming appointments discussed today Continue with compliance of taking medication prescribed by Doctor Implement healthy coping skills discussed to assist with management of symptoms         SDOH assessments and interventions completed:  No     Care Coordination Interventions:  Yes, provided  Interventions Today    Flowsheet Row Most Recent Value  Chronic Disease   Chronic disease during today's visit Chronic Obstructive Pulmonary Disease (COPD), Hypertension (HTN)  General Interventions   General Interventions Discussed/Reviewed General Interventions Discussed, Doctor Visits  [LCSW introduced self and explained role in care coordination services.]  Doctor Visits Discussed/Reviewed Doctor Visits Discussed  Exercise Interventions   Exercise Discussed/Reviewed Exercise Discussed  [Pt was referred to [facility] for PT/OT/SW. Met with SW, denied need for any resources]  Mental Health Interventions   Mental Health Discussed/Reviewed Mental Health Discussed, Coping Strategies  [Pt reports that she is not experiencing any mental health symptoms or engages in substance use]  Nutrition Interventions   Nutrition Discussed/Reviewed Nutrition Discussed  Pharmacy Interventions   Pharmacy Dicussed/Reviewed Pharmacy Topics Discussed, Medication Adherence  Safety Interventions   Safety Discussed/Reviewed Safety Discussed       Follow up plan: Follow up call  scheduled for 4-8 weeks    Encounter Outcome:  Pt. Visit Completed   Jenel Lucks, MSW, LCSW [facility] Care Management [facility]  [facility] [email or web address] Phone [phone] 6:11 PM

## 2022-10-27 ENCOUNTER — Ambulatory Visit: Payer: Self-pay

## 2022-10-27 DIAGNOSIS — I2510 Atherosclerotic heart disease of native coronary artery without angina pectoris: Secondary | ICD-10-CM | POA: Diagnosis not present

## 2022-10-27 DIAGNOSIS — I10 Essential (primary) hypertension: Secondary | ICD-10-CM | POA: Diagnosis not present

## 2022-10-27 DIAGNOSIS — I739 Peripheral vascular disease, unspecified: Secondary | ICD-10-CM | POA: Diagnosis not present

## 2022-10-27 DIAGNOSIS — F419 Anxiety disorder, unspecified: Secondary | ICD-10-CM | POA: Diagnosis not present

## 2022-10-27 DIAGNOSIS — J9811 Atelectasis: Secondary | ICD-10-CM | POA: Diagnosis not present

## 2022-10-27 DIAGNOSIS — I700 Atherosclerosis of aorta: Secondary | ICD-10-CM | POA: Diagnosis not present

## 2022-10-27 DIAGNOSIS — J449 Chronic obstructive pulmonary disease, unspecified: Secondary | ICD-10-CM | POA: Diagnosis not present

## 2022-10-27 DIAGNOSIS — F32A Depression, unspecified: Secondary | ICD-10-CM | POA: Diagnosis not present

## 2022-10-27 DIAGNOSIS — G3184 Mild cognitive impairment, so stated: Secondary | ICD-10-CM | POA: Diagnosis not present

## 2022-10-27 NOTE — Patient Instructions (Signed)
Visit Information  Thank you for taking time to visit with me today. Please don't hesitate to contact me if I can be of assistance to you.   Following are the goals we discussed today:   Goals Addressed             This Visit's Progress    To improve kidney function       Care Coordination Interventions: Assessed the Patient understanding of chronic kidney disease    Evaluation of current treatment plan related to chronic kidney disease self management and patient's adherence to plan as established by provider      Reviewed prescribed diet increase daily water intake to 48-64 oz daily unless otherwise directed  Provided education on kidney disease progression    Last practice recorded BP readings:  BP Readings from Last 3 Encounters:  10/20/22 (!) 119/57  12/03/20 (!) 120/53  07/23/20 (!) 147/92  Most recent eGFR/CrCl:  GFR, Estimated >60 mL/min 40 Low    10/20/2022        To start using inhalers as prescribed to improve shortness of breath       Care Coordination Interventions: Provided patient with basic written and verbal COPD education on self care/management/and exacerbation prevention Provided written and verbal instructions on pursed lip breathing and utilized returned demonstration as teach back Provided instruction about proper use of medications used for management of COPD including inhalers Advised patient to self assesses COPD action plan zone and make appointment with provider if in the yellow zone for 48 hours without improvement           Our next appointment is by telephone on 11/14/22 at 12:30 PM  Please call the care guide team at [phone] if you need to cancel or reschedule your appointment.   If you are experiencing a Mental Health or Behavioral Health Crisis or need someone to talk to, please call 1-800-273-TALK (toll free, 24 hour hotline) go to [facility] [address] [place] [phone])  The  patient verbalized understanding of instructions, educational materials, and care plan provided today and DECLINED offer to receive copy of patient instructions, educational materials, and care plan.   Delsa Sale, RN, BSN, CCM Care Management Coordinator [facility] Care Management  Direct Phone: [phone]

## 2022-10-27 NOTE — Patient Outreach (Signed)
  Care Coordination   Initial Visit Note   10/27/2022 Name: Crystal Noble MRN: [id number] DOB: [birth year]  Crystal Noble is a 77 y.o. year old female who sees Lewis Moccasin, MD for primary care. I spoke with  Crystal Noble by phone today.  What matters to the patients health and wellness today?  Patient would like to improve her shortness of breath.     Goals Addressed             This Visit's Progress    To improve kidney function       Care Coordination Interventions: Assessed the Patient understanding of chronic kidney disease    Evaluation of current treatment plan related to chronic kidney disease self management and patient's adherence to plan as established by provider      Reviewed prescribed diet increase daily water intake to 48-64 oz daily unless otherwise directed  Provided education on kidney disease progression    Last practice recorded BP readings:  BP Readings from Last 3 Encounters:  10/20/22 (!) 119/57  12/03/20 (!) 120/53  07/23/20 (!) 147/92  Most recent eGFR/CrCl:  GFR, Estimated >60 mL/min 40 Low    10/20/2022        To start using inhalers as prescribed to improve shortness of breath       Care Coordination Interventions: Provided patient with basic written and verbal COPD education on self care/management/and exacerbation prevention Provided written and verbal instructions on pursed lip breathing and utilized returned demonstration as teach back Provided instruction about proper use of medications used for management of COPD including inhalers Advised patient to self assesses COPD action plan zone and make appointment with provider if in the yellow zone for 48 hours without improvement     Interventions Today    Flowsheet Row Most Recent Value  Chronic Disease   Chronic disease during today's visit Chronic Obstructive Pulmonary Disease (COPD), Chronic Kidney Disease/End Stage Renal Disease (ESRD)  General Interventions    General Interventions Discussed/Reviewed General Interventions Discussed, General Interventions Reviewed, Labs, Doctor Visits  Doctor Visits Discussed/Reviewed Doctor Visits Discussed, Doctor Visits Reviewed, PCP, Specialist  Education Interventions   Education Provided Provided Education, Provided Printed Education  Provided Verbal Education On Labs, Medication, When to see the doctor  Labs Reviewed Kidney Function  Nutrition Interventions   Nutrition Discussed/Reviewed Nutrition Discussed, Nutrition Reviewed, Fluid intake  Pharmacy Interventions   Pharmacy Dicussed/Reviewed Pharmacy Topics Discussed, Pharmacy Topics Reviewed, Medications and their functions, Medication Adherence  [Educated patient re: how/when to use inhalers]          SDOH assessments and interventions completed:  No     Care Coordination Interventions:  Yes, provided   Follow up plan: Follow up call scheduled for 11/14/22 @12 :30 PM    Encounter Outcome:  Pt. Visit Completed

## 2022-10-28 DIAGNOSIS — I739 Peripheral vascular disease, unspecified: Secondary | ICD-10-CM | POA: Diagnosis not present

## 2022-10-28 DIAGNOSIS — I2510 Atherosclerotic heart disease of native coronary artery without angina pectoris: Secondary | ICD-10-CM | POA: Diagnosis not present

## 2022-10-28 DIAGNOSIS — J9811 Atelectasis: Secondary | ICD-10-CM | POA: Diagnosis not present

## 2022-10-28 DIAGNOSIS — I700 Atherosclerosis of aorta: Secondary | ICD-10-CM | POA: Diagnosis not present

## 2022-10-28 DIAGNOSIS — F32A Depression, unspecified: Secondary | ICD-10-CM | POA: Diagnosis not present

## 2022-10-28 DIAGNOSIS — G3184 Mild cognitive impairment, so stated: Secondary | ICD-10-CM | POA: Diagnosis not present

## 2022-10-28 DIAGNOSIS — F419 Anxiety disorder, unspecified: Secondary | ICD-10-CM | POA: Diagnosis not present

## 2022-10-28 DIAGNOSIS — I10 Essential (primary) hypertension: Secondary | ICD-10-CM | POA: Diagnosis not present

## 2022-10-28 DIAGNOSIS — J449 Chronic obstructive pulmonary disease, unspecified: Secondary | ICD-10-CM | POA: Diagnosis not present

## 2022-11-02 DIAGNOSIS — I10 Essential (primary) hypertension: Secondary | ICD-10-CM | POA: Diagnosis not present

## 2022-11-02 DIAGNOSIS — F419 Anxiety disorder, unspecified: Secondary | ICD-10-CM | POA: Diagnosis not present

## 2022-11-02 DIAGNOSIS — I739 Peripheral vascular disease, unspecified: Secondary | ICD-10-CM | POA: Diagnosis not present

## 2022-11-02 DIAGNOSIS — I2510 Atherosclerotic heart disease of native coronary artery without angina pectoris: Secondary | ICD-10-CM | POA: Diagnosis not present

## 2022-11-02 DIAGNOSIS — J9811 Atelectasis: Secondary | ICD-10-CM | POA: Diagnosis not present

## 2022-11-02 DIAGNOSIS — J449 Chronic obstructive pulmonary disease, unspecified: Secondary | ICD-10-CM | POA: Diagnosis not present

## 2022-11-02 DIAGNOSIS — I700 Atherosclerosis of aorta: Secondary | ICD-10-CM | POA: Diagnosis not present

## 2022-11-02 DIAGNOSIS — F32A Depression, unspecified: Secondary | ICD-10-CM | POA: Diagnosis not present

## 2022-11-02 DIAGNOSIS — G3184 Mild cognitive impairment, so stated: Secondary | ICD-10-CM | POA: Diagnosis not present

## 2022-11-07 DIAGNOSIS — I700 Atherosclerosis of aorta: Secondary | ICD-10-CM | POA: Diagnosis not present

## 2022-11-07 DIAGNOSIS — I2510 Atherosclerotic heart disease of native coronary artery without angina pectoris: Secondary | ICD-10-CM | POA: Diagnosis not present

## 2022-11-07 DIAGNOSIS — G3184 Mild cognitive impairment, so stated: Secondary | ICD-10-CM | POA: Diagnosis not present

## 2022-11-07 DIAGNOSIS — I739 Peripheral vascular disease, unspecified: Secondary | ICD-10-CM | POA: Diagnosis not present

## 2022-11-07 DIAGNOSIS — F32A Depression, unspecified: Secondary | ICD-10-CM | POA: Diagnosis not present

## 2022-11-07 DIAGNOSIS — J449 Chronic obstructive pulmonary disease, unspecified: Secondary | ICD-10-CM | POA: Diagnosis not present

## 2022-11-07 DIAGNOSIS — J9811 Atelectasis: Secondary | ICD-10-CM | POA: Diagnosis not present

## 2022-11-07 DIAGNOSIS — F419 Anxiety disorder, unspecified: Secondary | ICD-10-CM | POA: Diagnosis not present

## 2022-11-07 DIAGNOSIS — I10 Essential (primary) hypertension: Secondary | ICD-10-CM | POA: Diagnosis not present

## 2022-11-08 DIAGNOSIS — I10 Essential (primary) hypertension: Secondary | ICD-10-CM | POA: Diagnosis not present

## 2022-11-08 DIAGNOSIS — F1020 Alcohol dependence, uncomplicated: Secondary | ICD-10-CM | POA: Diagnosis not present

## 2022-11-08 DIAGNOSIS — R7989 Other specified abnormal findings of blood chemistry: Secondary | ICD-10-CM | POA: Diagnosis not present

## 2022-11-08 DIAGNOSIS — R64 Cachexia: Secondary | ICD-10-CM | POA: Diagnosis not present

## 2022-11-08 DIAGNOSIS — F17200 Nicotine dependence, unspecified, uncomplicated: Secondary | ICD-10-CM | POA: Diagnosis not present

## 2022-11-08 DIAGNOSIS — J449 Chronic obstructive pulmonary disease, unspecified: Secondary | ICD-10-CM | POA: Diagnosis not present

## 2022-11-10 ENCOUNTER — Encounter: Payer: Self-pay | Admitting: Cardiology

## 2022-11-10 ENCOUNTER — Ambulatory Visit: Payer: No Typology Code available for payment source | Admitting: Cardiology

## 2022-11-10 VITALS — BP 131/54 | HR 109 | Resp 16 | Ht 60.0 in | Wt <= 1120 oz

## 2022-11-10 DIAGNOSIS — I2510 Atherosclerotic heart disease of native coronary artery without angina pectoris: Secondary | ICD-10-CM | POA: Diagnosis not present

## 2022-11-10 DIAGNOSIS — R0609 Other forms of dyspnea: Secondary | ICD-10-CM

## 2022-11-10 NOTE — Progress Notes (Signed)
Patient referred by Lewis Moccasin, MD for heart failure  Subjective:   Crystal Noble, female    DOB: 1945-12-04, 77 y.o.   MRN: [id number]   Chief Complaint  Patient presents with   Asymptomatic stenosis of both carotid arteries without infa   Hyperlipidemia   New Patient (Initial Visit)   Shortness of Breath    HPI  77 y.o. Caucasian female with CAD (noted on CT chest), asymptomatic carotid artery disease, COPD, tobacco dependence, exertional dyspnea, weight loss.   Patient was last seen by me in 09/2019.  She was recently hospitalized in 10/2022 with unintentional weight loss, exertional dyspnea that was thought to be related to COPD.  He is referred back to me now for possibility of any heart failure as etiology of her exertional dyspnea.  To me, patient denies any "more than usual" exertional dyspnea.  She also denies chest pain.  However, she states that she "feels shaky".  She has also had 30 pounds of unintentional weight loss, etiology of of that remains unclear.  Past Medical History:  Diagnosis Date   Carotid stenosis, asymptomatic    Colitis, collagenous    Hypertension    Stroke (HCC) 06/2019     Past Surgical History:  Procedure Laterality Date   APPENDECTOMY       Social History   Tobacco Use  Smoking Status Every Day   Packs/day: 0.50   Years: 30.00   Additional pack years: 0.00   Total pack years: 15.00   Types: Cigarettes  Smokeless Tobacco Never    Social History   Substance and Sexual Activity  Alcohol Use Yes   Alcohol/week: 21.0 standard drinks of alcohol   Types: 21 Glasses of wine per week     Family History  Problem Relation Age of Onset   Hypertension Mother    Hyperlipidemia Mother    Heart attack Mother    Depression Mother        Bi-Polar   Cancer Father        Lung      Current Outpatient Medications:    albuterol (VENTOLIN HFA) 108 (90 Base) MCG/ACT inhaler, Inhale 2 puffs into the lungs every 6 hours  as needed for wheezing or shortness of breath. (Patient not taking: Reported on 10/27/2022), Disp: 6.7 g, Rfl: 0   amLODipine (NORVASC) 5 MG tablet, Take 1 tablet (5 mg total) by mouth daily., Disp: 30 tablet, Rfl: 0   ARIPiprazole (ABILIFY) 20 MG tablet, Take 20 mg by mouth daily., Disp: , Rfl:    busPIRone (BUSPAR) 10 MG tablet, Take 20 mg by mouth daily., Disp: , Rfl:    Cholecalciferol (VITAMIN D3 ULTRA STRENGTH PO), Take 1 tablet by mouth daily., Disp: , Rfl:    escitalopram (LEXAPRO) 20 MG tablet, Take 20 mg by mouth daily., Disp: , Rfl:    rosuvastatin (CRESTOR) 10 MG tablet, Take 10 mg by mouth at bedtime., Disp: , Rfl:    Tiotropium Bromide Monohydrate (SPIRIVA RESPIMAT) 2.5 MCG/ACT AERS, Inhale 2 puffs once daily. (Patient not taking: Reported on 10/27/2022), Disp: 4 g, Rfl: 0   Cardiovascular and other pertinent studies:  Reviewed external labs and tests, independently interpreted   EKG 11/10/2022: Sinus rhythm 91 bpm Left atrial enlargement LBBB  Anteroseptal infarct -age undetermined  CXR 10/17/2022: Hyperinflation with chronic changes. Tiny effusion versus pleural thickening.     Lexiscan Tetrofosmin Stress Test  04/28/2019: Non-diagnostic stress EKG. Mod Bruce protocol with lexiscan protocol used.  Normal  myocardial perfusion. All segments of left ventricle demonstrated normal wall motion and thickening. Stress LV EF is normal 70%.  No previous exam available for comparison. Low risk study.   Carotid US 04/10/2019: Minimal stenosis in the right internal carotid artery (1-15%). Stenosis in the left internal carotid artery (50-69%). Antegrade right vertebral artery flow. Antegrade left vertebral artery flow. Compared to external report review, no significant change from 11/21/2017. Follow up in six months is appropriate if clinically indicated.   Echocardiogram 04/10/2019: Left ventricle cavity is normal in size. Normal left ventricular wall thickness. Normal LV systolic  function with EF 55%. Normal global wall motion. Diastolic function assessment limited due to degree of mitral annular calcification.  Moderate calcification of the mitral valve annulus. Mild to moderate mitral regurgitation. Moderate tricuspid regurgitation. Estimated pulmonary artery systolic pressure is 29 mmHg.   EKG 03/27/2019: Sinus rhythm 97 bpm. Biatrial enlargement. cannot exclude old anteroseptal infarct.    CT chest 03/01/2019: Two vessel coronary atherosclerosis. Aortic atherosclerosis.  Mitral annular calcification. Emphysema. Hepatic steatosis.    Carotid US 11/2017: Right Carotid: Velocities in the right ICA are consistent with a 1-39% stenosis. Left Carotid: Velocities in the left ICA are consistent with a 40-59% stenosis.               The ECA appears >50% stenosed. Vertebrals:  Bilateral vertebral arteries demonstrate antegrade flow. Subclavians: Normal flow hemodynamics were seen in bilateral subclavian arteries.    Recent labs: 10/20/2022: Glucose 98, BUN/Cr 51/1.37. EGFR 40. Na/K 135/3.9.  H/H 10/30. MCV 99. Platelets 199 HbA1C 5.9% TSH 0.8 normal  06/2020: Chol 136, TG 46, HDL 5, LDL 70    Review of Systems  Constitutional: Positive for weight loss.       Feels shaky  Cardiovascular:  Negative for chest pain, dyspnea on exertion, leg swelling, palpitations and syncope.         Vitals:   11/10/22 1407  BP: (!) 131/54  Pulse: (!) 109  Resp: 16  SpO2: 94%     Body mass index is 13.28 kg/m. Filed Weights   11/10/22 1407  Weight: 68 lb (30.8 kg)     Objective:   Physical Exam Vitals and nursing note reviewed.  Constitutional:      General: She is not in acute distress.    Appearance: She is cachectic.  Neck:     Vascular: No JVD.  Cardiovascular:     Rate and Rhythm: Regular rhythm. Tachycardia present.     Heart sounds: Normal heart sounds. No murmur heard. Pulmonary:     Effort: Pulmonary effort is normal.     Breath sounds:  Normal breath sounds. No wheezing or rales.  Musculoskeletal:     Right lower leg: No edema.     Left lower leg: No edema.         Visit diagnoses:   ICD-10-CM   1. Dyspnea on exertion  R06.09 EKG 12-Lead    TSH    Pro b natriuretic peptide (BNP)[facility] [facility])    PCV ECHOCARDIOGRAM COMPLETE    2. Coronary artery disease involving native coronary artery of native heart without angina pectoris  I25.10        Orders Placed This Encounter  Procedures   TSH   Pro b natriuretic peptide (BNP)[facility] [facility])   EKG 12-Lead   PCV ECHOCARDIOGRAM COMPLETE        Assessment & Recommendations:    76 y.o. Caucasian female with CAD (noted on CT  chest), asymptomatic carotid artery disease, COPD, tobacco dependence, exertional dyspnea, weight loss.   Exertional dyspnea: I patient denies exertional dyspnea to me, she was recently admitted with similar complaint.  I do hear systolic murmur suggestive of MR on exam.  I gather that there is concern regarding possible high-output heart failure.  I will obtain echocardiogram.  Also, I will obtain TSH given her unintentional weight loss, "shaky feeling", and concern for heart failure.  Further recommendations after above testing.   Thank you for referring the patient to [place]. Please feel free to contact with any questions.   Elder Negus, MD Pager: [phone] Office: [phone]

## 2022-11-18 DIAGNOSIS — I2510 Atherosclerotic heart disease of native coronary artery without angina pectoris: Secondary | ICD-10-CM | POA: Diagnosis not present

## 2022-11-18 DIAGNOSIS — J9811 Atelectasis: Secondary | ICD-10-CM | POA: Diagnosis not present

## 2022-11-18 DIAGNOSIS — I739 Peripheral vascular disease, unspecified: Secondary | ICD-10-CM | POA: Diagnosis not present

## 2022-11-18 DIAGNOSIS — I10 Essential (primary) hypertension: Secondary | ICD-10-CM | POA: Diagnosis not present

## 2022-11-18 DIAGNOSIS — F32A Depression, unspecified: Secondary | ICD-10-CM | POA: Diagnosis not present

## 2022-11-18 DIAGNOSIS — J449 Chronic obstructive pulmonary disease, unspecified: Secondary | ICD-10-CM | POA: Diagnosis not present

## 2022-11-18 DIAGNOSIS — I700 Atherosclerosis of aorta: Secondary | ICD-10-CM | POA: Diagnosis not present

## 2022-11-18 DIAGNOSIS — F419 Anxiety disorder, unspecified: Secondary | ICD-10-CM | POA: Diagnosis not present

## 2022-11-18 DIAGNOSIS — G3184 Mild cognitive impairment, so stated: Secondary | ICD-10-CM | POA: Diagnosis not present

## 2022-11-21 ENCOUNTER — Ambulatory Visit: Payer: Self-pay | Admitting: Licensed Clinical Social Worker

## 2022-11-21 DIAGNOSIS — G3184 Mild cognitive impairment, so stated: Secondary | ICD-10-CM | POA: Diagnosis not present

## 2022-11-21 DIAGNOSIS — I2510 Atherosclerotic heart disease of native coronary artery without angina pectoris: Secondary | ICD-10-CM | POA: Diagnosis not present

## 2022-11-21 DIAGNOSIS — F419 Anxiety disorder, unspecified: Secondary | ICD-10-CM | POA: Diagnosis not present

## 2022-11-21 DIAGNOSIS — J9811 Atelectasis: Secondary | ICD-10-CM | POA: Diagnosis not present

## 2022-11-21 DIAGNOSIS — J449 Chronic obstructive pulmonary disease, unspecified: Secondary | ICD-10-CM | POA: Diagnosis not present

## 2022-11-21 DIAGNOSIS — I10 Essential (primary) hypertension: Secondary | ICD-10-CM | POA: Diagnosis not present

## 2022-11-21 DIAGNOSIS — I700 Atherosclerosis of aorta: Secondary | ICD-10-CM | POA: Diagnosis not present

## 2022-11-21 DIAGNOSIS — I739 Peripheral vascular disease, unspecified: Secondary | ICD-10-CM | POA: Diagnosis not present

## 2022-11-21 DIAGNOSIS — F32A Depression, unspecified: Secondary | ICD-10-CM | POA: Diagnosis not present

## 2022-11-22 ENCOUNTER — Ambulatory Visit: Payer: Self-pay

## 2022-11-22 NOTE — Patient Instructions (Signed)
Visit Information  Thank you for taking time to visit with me today. Please don't hesitate to contact me if I can be of assistance to you.   Following are the goals we discussed today:   Goals Addressed             This Visit's Progress    Increase daily protein intake       Care Coordination Interventions: Evaluation of current treatment plan related to unintentional weight loss  and patient's adherence to plan as established by provider Determined patient has experienced a 30 lb unintentional weight loss  Assessed for nutritional status and meal intake Determined patient eats 3 regular meals per day, she feels full after a few bites but finishes her meal Determined patient does not currently a weight scale in her home to help monitor weights Educated patient on how to contact her health plan to inquire about purchasing a weight scale through her OTC benefit, discussed patient will contact this RN if further assistance is needed and or the scales will not be covered by her health plan  Educated patient on the importance of taking enough protein daily to help support good health, immunity, muscle maintenance and physical function in older adults Encouraged patient to consider adding nutritional supplementation with Ensure or Boost, patient declines and does not wish to add protein supplements at this time  Mailed printed educational materials related to elderly nutrition and the importance of protein  Body mass index is 13.28 kg/m.    Filed Weight    11/10/22 1407  Weight: 68 lb (30.8 kg)           To improve my ability to walk       Care Coordination Interventions: Evaluation of current treatment plan related to Impaired physical mobility  and patient's adherence to plan as established by provider Determined patient continues to work with in home PT for strengthening and balance Discussed with patient she feels her walking could improve and her goal is to improve her ability to  walk  Assessed for falls and or home safety concerns, patient denies  Assessed for DME needs and patient denies needing DME at this time  Educated patient on the benefits of adhering to her HEP for ongoing support of balance, ROM and mobility Educated patient on the importance to pace herself and to stay well hydrated with water, aiming for 48-64 oz daily            Our next appointment is by telephone on 12/27/22 @11 :30 AM   Please call the care guide team at [phone] if you need to cancel or reschedule your appointment.   If you are experiencing a Mental Health or Behavioral Health Crisis or need someone to talk to, please call 1-800-273-TALK (toll free, 24 hour hotline)  The patient verbalized understanding of instructions, educational materials, and care plan provided today and DECLINED offer to receive copy of patient instructions, educational materials, and care plan.   Delsa Sale, RN, BSN, CCM Care Management Coordinator [facility] Care Management  Direct Phone: [phone]

## 2022-11-22 NOTE — Patient Outreach (Signed)
Care Coordination   Follow Up Visit Note   11/22/2022 Name: Crystal Noble MRN: [id number] DOB: January 21, 1946  Crystal Noble is a 77 y.o. year old female who sees Lewis Moccasin, MD for primary care. I spoke with  [name] by phone today.  What matters to the patients health and wellness today?  Patient would like to improve her ability to walk.     Goals Addressed             This Visit's Progress    Increase daily protein intake       Care Coordination Interventions: Evaluation of current treatment plan related to unintentional weight loss  and patient's adherence to plan as established by provider Determined patient has experienced a 30 lb unintentional weight loss  Assessed for nutritional status and meal intake Determined patient eats 3 regular meals per day, she feels full after a few bites but finishes her meal Determined patient does not currently a weight scale in her home to help monitor weights Educated patient on how to contact her health plan to inquire about purchasing a weight scale through her OTC benefit, discussed patient will contact this RN if further assistance is needed and or the scales will not be covered by her health plan  Educated patient on the importance of taking enough protein daily to help support good health, immunity, muscle maintenance and physical function in older adults Encouraged patient to consider adding nutritional supplementation with Ensure or Boost, patient declines and does not wish to add protein supplements at this time  Mailed printed educational materials related to elderly nutrition and the importance of protein  Body mass index is 13.28 kg/m.    Filed Weight    11/10/22 1407  Weight: 68 lb (30.8 kg)       To improve my ability to walk       Care Coordination Interventions: Evaluation of current treatment plan related to Impaired physical mobility  and patient's adherence to plan as established by  provider Determined patient continues to work with in home PT for strengthening and balance Discussed with patient she feels her walking could improve and her goal is to improve her ability to walk  Assessed for falls and or home safety concerns, patient denies  Assessed for DME needs and patient denies needing DME at this time  Educated patient on the benefits of adhering to her HEP for ongoing support of balance, ROM and mobility Educated patient on the importance to pace herself and to stay well hydrated with water, aiming for 48-64 oz daily     Interventions Today    Flowsheet Row Most Recent Value  Chronic Disease   Chronic disease during today's visit Other  [dyspnea on exertion,  CAD,  weight loss]  General Interventions   General Interventions Discussed/Reviewed General Interventions Discussed, General Interventions Reviewed, Doctor Visits, Labs  Doctor Visits Discussed/Reviewed Doctor Visits Discussed, Doctor Visits Reviewed, Specialist  Exercise Interventions   Exercise Discussed/Reviewed Exercise Discussed, Exercise Reviewed, Physical Activity, Assistive device use and maintanence  Physical Activity Discussed/Reviewed Physical Activity Discussed, Physical Activity Reviewed, Types of exercise, Home Exercise Program (HEP)  Education Interventions   Education Provided Provided Printed Education, Provided Education  Provided Verbal Education On Nutrition, When to see the doctor, Labs, Exercise  Nutrition Interventions   Nutrition Discussed/Reviewed Nutrition Discussed, Nutrition Reviewed, Increasing proteins, Fluid intake  Safety Interventions   Safety Discussed/Reviewed Safety Discussed, Safety Reviewed, Fall Risk, Home Safety  Home Safety Assistive  Devices          SDOH assessments and interventions completed:  No     Care Coordination Interventions:  Yes, provided   Follow up plan: Follow up call scheduled for 12/27/22 @11 :30 AM    Encounter Outcome:  Pt. Visit  Completed

## 2022-11-23 NOTE — Patient Instructions (Signed)
Visit Information  Thank you for taking time to visit with me today. Please don't hesitate to contact me if I can be of assistance to you.   Following are the goals we discussed today:   Goals Addressed             This Visit's Progress    LCSW Plan of Care-Obtain Supportive Resources   On track    Activities and task to complete in order to accomplish goals.   Keep all upcoming appointments discussed today Continue with compliance of taking medication prescribed by Doctor Implement healthy coping skills discussed to assist with management of symptoms         Our next appointment is by telephone on 7/26 at 11 AM  Please call the care guide team at [phone] if you need to cancel or reschedule your appointment.   If you are experiencing a Mental Health or Behavioral Health Crisis or need someone to talk to, please call the Suicide and Crisis Lifeline: 988 call 911   The patient verbalized understanding of instructions, educational materials, and care plan provided today and DECLINED offer to receive copy of patient instructions, educational materials, and care plan.   Jenel Lucks, MSW, LCSW [facility] Care Management [place]  [facility] [email or web address] Phone [phone] 8:32 AM

## 2022-11-23 NOTE — Patient Outreach (Signed)
  Care Coordination   Follow Up Visit Note   11/21/2022 Name: Crystal Noble MRN: [id number] DOB: Oct 18, 1945  Crystal Noble is a 77 y.o. year old female who sees Anaia Frith Moccasin, MD for primary care. I spoke with  [name] by phone today.  What matters to the patients health and wellness today?  Symptom Management    Goals Addressed             This Visit's Progress    LCSW Plan of Care-Obtain Supportive Resources   On track    Activities and task to complete in order to accomplish goals.   Keep all upcoming appointments discussed today Continue with compliance of taking medication prescribed by Doctor Implement healthy coping skills discussed to assist with management of symptoms         SDOH assessments and interventions completed:  No     Care Coordination Interventions:  Yes, provided   Follow up plan: Follow up call scheduled for 7/26    Encounter Outcome:  Pt. Visit Completed   Jenel Lucks, MSW, LCSW [facility] Care Management [facility]  [facility] [email or web address] Phone [phone] 8:31 AM

## 2022-11-29 DIAGNOSIS — I10 Essential (primary) hypertension: Secondary | ICD-10-CM | POA: Diagnosis not present

## 2022-11-29 DIAGNOSIS — R5383 Other fatigue: Secondary | ICD-10-CM | POA: Diagnosis not present

## 2022-11-29 DIAGNOSIS — E559 Vitamin D deficiency, unspecified: Secondary | ICD-10-CM | POA: Diagnosis not present

## 2022-12-06 ENCOUNTER — Emergency Department (HOSPITAL_COMMUNITY): Payer: Medicare HMO

## 2022-12-06 ENCOUNTER — Emergency Department (HOSPITAL_COMMUNITY)
Admission: EM | Admit: 2022-12-06 | Discharge: 2022-12-06 | Disposition: A | Discharge status: Home / Self Care | Payer: Medicare HMO | Attending: Emergency Medicine | Admitting: Emergency Medicine

## 2022-12-06 DIAGNOSIS — R0602 Shortness of breath: Secondary | ICD-10-CM | POA: Diagnosis not present

## 2022-12-06 DIAGNOSIS — R627 Adult failure to thrive: Secondary | ICD-10-CM | POA: Diagnosis not present

## 2022-12-06 DIAGNOSIS — F411 Generalized anxiety disorder: Secondary | ICD-10-CM | POA: Diagnosis not present

## 2022-12-06 DIAGNOSIS — N858 Other specified noninflammatory disorders of uterus: Secondary | ICD-10-CM | POA: Diagnosis not present

## 2022-12-06 DIAGNOSIS — R634 Abnormal weight loss: Secondary | ICD-10-CM | POA: Diagnosis not present

## 2022-12-06 DIAGNOSIS — I10 Essential (primary) hypertension: Secondary | ICD-10-CM | POA: Insufficient documentation

## 2022-12-06 DIAGNOSIS — E46 Unspecified protein-calorie malnutrition: Secondary | ICD-10-CM | POA: Diagnosis not present

## 2022-12-06 DIAGNOSIS — R64 Cachexia: Secondary | ICD-10-CM | POA: Diagnosis not present

## 2022-12-06 DIAGNOSIS — E559 Vitamin D deficiency, unspecified: Secondary | ICD-10-CM | POA: Diagnosis not present

## 2022-12-06 DIAGNOSIS — F1020 Alcohol dependence, uncomplicated: Secondary | ICD-10-CM | POA: Diagnosis not present

## 2022-12-06 DIAGNOSIS — I2510 Atherosclerotic heart disease of native coronary artery without angina pectoris: Secondary | ICD-10-CM | POA: Diagnosis not present

## 2022-12-06 DIAGNOSIS — Z79899 Other long term (current) drug therapy: Secondary | ICD-10-CM | POA: Insufficient documentation

## 2022-12-06 DIAGNOSIS — I3481 Nonrheumatic mitral (valve) annulus calcification: Secondary | ICD-10-CM | POA: Diagnosis not present

## 2022-12-06 DIAGNOSIS — R0600 Dyspnea, unspecified: Secondary | ICD-10-CM | POA: Diagnosis not present

## 2022-12-06 DIAGNOSIS — I2699 Other pulmonary embolism without acute cor pulmonale: Secondary | ICD-10-CM | POA: Diagnosis not present

## 2022-12-06 DIAGNOSIS — R7989 Other specified abnormal findings of blood chemistry: Secondary | ICD-10-CM | POA: Diagnosis not present

## 2022-12-06 LAB — CBC WITH DIFFERENTIAL/PLATELET
Abs Immature Granulocytes: 0.04 10*3/uL (ref 0.00–0.07)
Basophils Absolute: 0 10*3/uL (ref 0.0–0.1)
Basophils Relative: 0 %
Eosinophils Absolute: 0.1 10*3/uL (ref 0.0–0.5)
Eosinophils Relative: 1 %
HCT: 33.1 % — ABNORMAL LOW (ref 36.0–46.0)
Hemoglobin: 10.7 g/dL — ABNORMAL LOW (ref 12.0–15.0)
Immature Granulocytes: 1 %
Lymphocytes Relative: 15 %
Lymphs Abs: 1.1 10*3/uL (ref 0.7–4.0)
MCH: 31.9 pg (ref 26.0–34.0)
MCHC: 32.3 g/dL (ref 30.0–36.0)
MCV: 98.8 fL (ref 80.0–100.0)
Monocytes Absolute: 0.5 10*3/uL (ref 0.1–1.0)
Monocytes Relative: 6 %
Neutro Abs: 5.9 10*3/uL (ref 1.7–7.7)
Neutrophils Relative %: 77 %
Platelets: 259 10*3/uL (ref 150–400)
RBC: 3.35 MIL/uL — ABNORMAL LOW (ref 3.87–5.11)
RDW: 13.4 % (ref 11.5–15.5)
WBC: 7.6 10*3/uL (ref 4.0–10.5)
nRBC: 0 % (ref 0.0–0.2)

## 2022-12-06 LAB — COMPREHENSIVE METABOLIC PANEL
ALT: 27 U/L (ref 0–44)
AST: 28 U/L (ref 15–41)
Albumin: 3.9 g/dL (ref 3.5–5.0)
Alkaline Phosphatase: 62 U/L (ref 38–126)
Anion gap: 10 (ref 5–15)
BUN: 47 mg/dL — ABNORMAL HIGH (ref 8–23)
CO2: 21 mmol/L — ABNORMAL LOW (ref 22–32)
Calcium: 9.3 mg/dL (ref 8.9–10.3)
Chloride: 108 mmol/L (ref 98–111)
Creatinine, Ser: 1.51 mg/dL — ABNORMAL HIGH (ref 0.44–1.00)
GFR, Estimated: 36 mL/min — ABNORMAL LOW (ref >60)
Glucose, Bld: 103 mg/dL — ABNORMAL HIGH (ref 70–99)
Potassium: 4.1 mmol/L (ref 3.5–5.1)
Sodium: 139 mmol/L (ref 135–145)
Total Bilirubin: 0.6 mg/dL (ref 0.3–1.2)
Total Protein: 6.7 g/dL (ref 6.5–8.1)

## 2022-12-06 LAB — URINALYSIS, ROUTINE W REFLEX MICROSCOPIC
Bilirubin Urine: NEGATIVE
Glucose, UA: NEGATIVE mg/dL
Hgb urine dipstick: NEGATIVE
Ketones, ur: NEGATIVE mg/dL
Nitrite: NEGATIVE
Protein, ur: NEGATIVE mg/dL
Specific Gravity, Urine: 1.014 (ref 1.005–1.030)
pH: 5 (ref 5.0–8.0)

## 2022-12-06 MED ORDER — LACTATED RINGERS IV BOLUS
1000.0000 mL | Freq: Once | INTRAVENOUS | Status: AC
  Administered 2022-12-06: 1000 mL via INTRAVENOUS

## 2022-12-06 MED ORDER — IOHEXOL 350 MG/ML SOLN
80.0000 mL | Freq: Once | INTRAVENOUS | Status: AC | PRN
  Administered 2022-12-06: 80 mL via INTRAVENOUS

## 2022-12-06 MED ORDER — DEXAMETHASONE SODIUM PHOSPHATE 10 MG/ML IJ SOLN
10.0000 mg | Freq: Once | INTRAMUSCULAR | Status: AC
  Administered 2022-12-06: 10 mg via INTRAVENOUS
  Filled 2022-12-06: qty 1

## 2022-12-06 NOTE — ED Notes (Signed)
Pt arrives per PCP for eval of failure to thrive like situation with minimal weight loss, decrease in appetite, and worsening kidney function. Pt denies complaints, does confirm that she has had decreased oral intake. 1.6 Lb weightloss since May on paperwork from PCP. Pt reports she lives alone and has had some decrease in getting around. Denies falls or injury.

## 2022-12-06 NOTE — ED Triage Notes (Signed)
Patient in today reporting ongoing issues with nutrition. States that she is eating but reports pcp  says "Im not eating enough". Reports pcp would like admission for tpn.

## 2022-12-06 NOTE — ED Provider Notes (Signed)
[place] EMERGENCY DEPARTMENT AT [facility] Provider Note   CSN: [id number] Arrival date & time: 12/06/22  1412     History {Add pertinent medical, surgical, social history, OB history to HPI:1} Chief Complaint  Patient presents with   Failure To Thrive    Crystal Noble is a 77 y.o. female.  HPI     77 year old female with a history of hypertension, CVA, anxiety/depression, tobacco use, alcohol use, admission in May with concern for acute kidney injury, dyspnea with suspicion for COPD, 30 pound weight loss, who presents with concern for continued weight loss.  Used to weigh 90lb and now 60lb over the last 6 months, 1.6lb since May. Eating 3 times a day, half a bagel with cream cheese for breakfast reports same as prior, [employer] burger for lunch used to do that, Dinner having less vegetable/meat.  No abdominal pain, no nausea or vomiting, no diarrhea or constipation No fever Feeling short of breath, worsening recently, no leg swelling, no cough or chest pain, not worse laying down flat, not worse with exertion.  Still smoking.  Not wheezing. Walking but not real well. Living alone. No falls.    Past Medical History:  Diagnosis Date   Carotid stenosis, asymptomatic    Colitis, collagenous    Hypertension    Stroke (HCC) 06/2019    Home Medications Prior to Admission medications   Medication Sig Start Date End Date Taking? Authorizing Provider  amLODipine (NORVASC) 5 MG tablet Take 1 tablet (5 mg total) by mouth daily. 10/20/22 10/20/23  Leatha Gilding, MD  ARIPiprazole (ABILIFY) 20 MG tablet Take 20 mg by mouth daily.    [provider]  busPIRone (BUSPAR) 10 MG tablet Take 20 mg by mouth daily.    [provider]  Cholecalciferol (VITAMIN D3 ULTRA STRENGTH PO) Take 1 tablet by mouth daily.    [provider]  escitalopram (LEXAPRO) 20 MG tablet Take 20 mg by mouth daily. 03/17/20   [provider]  rosuvastatin  (CRESTOR) 10 MG tablet Take 10 mg by mouth at bedtime. 04/26/20   [provider]  Dwyane Luo 200-62.5-25 MCG/ACT AEPB Take 1 puff by mouth daily. 10/25/22   [provider]  valsartan-hydrochlorothiazide (DIOVAN-HCT) 320-25 MG tablet Take 1 tablet by mouth every morning. 10/25/22   [provider]      Allergies    Other and Penicillin g    Review of Systems   Review of Systems  Physical Exam Updated Vital Signs BP (!) 152/71   Pulse 97   Temp 98.4 F (36.9 C) (Oral)   Resp 19   SpO2 98%  Physical Exam  ED Results / Procedures / Treatments   Labs (all labs ordered are listed, but only abnormal results are displayed) Labs Reviewed  COMPREHENSIVE METABOLIC PANEL  CBC WITH DIFFERENTIAL/PLATELET  URINALYSIS, ROUTINE W REFLEX MICROSCOPIC    EKG None  Radiology No results found.  Procedures Procedures  {Document cardiac monitor, telemetry assessment procedure when appropriate:1}  Medications Ordered in ED Medications - No data to display  ED Course/ Medical Decision Making/ A&P   {   Click here for ABCD2, HEART and other calculatorsREFRESH Note before signing :1}                          Medical Decision Making Amount and/or Complexity of Data Reviewed Labs: ordered. Radiology: ordered.  Risk Prescription drug management.     77 year old female  with a history of hypertension, CVA, anxiety/depression, tobacco use, alcohol use, admission in May with concern for acute kidney injury, dyspnea with suspicion for COPD, 30 pound weight loss, who presents with concern for continued weight loss.    Labs completed and personally about interpreted by me show no sign of urinary tract infection, creatinine 1.5, similar to recent prior values with discharge creatinine from recent admission of 1.37.  Hemoglobin is stable at 10.7.   CT, did and shows no evidence of PE, no intrathoracic, intra-abdominal or intrapelvic process, does show  coronary artery atherosclerosis.     {Document critical care time when appropriate:1} {Document review of labs and clinical decision tools ie heart score, Chads2Vasc2 etc:1}  {Document your independent review of radiology images, and any outside records:1} {Document your discussion with family members, caretakers, and with consultants:1} {Document social determinants of health affecting pt's care:1} {Document your decision making why or why not admission, treatments were needed:1} Final Clinical Impression(s) / ED Diagnoses Final diagnoses:  None    Rx / DC Orders ED Discharge Orders     None

## 2022-12-07 ENCOUNTER — Other Ambulatory Visit: Payer: No Typology Code available for payment source

## 2022-12-07 DIAGNOSIS — F419 Anxiety disorder, unspecified: Secondary | ICD-10-CM | POA: Diagnosis not present

## 2022-12-07 DIAGNOSIS — I10 Essential (primary) hypertension: Secondary | ICD-10-CM | POA: Diagnosis not present

## 2022-12-07 DIAGNOSIS — G3184 Mild cognitive impairment, so stated: Secondary | ICD-10-CM | POA: Diagnosis not present

## 2022-12-07 DIAGNOSIS — I2510 Atherosclerotic heart disease of native coronary artery without angina pectoris: Secondary | ICD-10-CM | POA: Diagnosis not present

## 2022-12-07 DIAGNOSIS — J9811 Atelectasis: Secondary | ICD-10-CM | POA: Diagnosis not present

## 2022-12-07 DIAGNOSIS — J449 Chronic obstructive pulmonary disease, unspecified: Secondary | ICD-10-CM | POA: Diagnosis not present

## 2022-12-07 DIAGNOSIS — I739 Peripheral vascular disease, unspecified: Secondary | ICD-10-CM | POA: Diagnosis not present

## 2022-12-07 DIAGNOSIS — F32A Depression, unspecified: Secondary | ICD-10-CM | POA: Diagnosis not present

## 2022-12-07 DIAGNOSIS — I700 Atherosclerosis of aorta: Secondary | ICD-10-CM | POA: Diagnosis not present

## 2022-12-08 ENCOUNTER — Encounter: Payer: Self-pay | Admitting: Gastroenterology

## 2022-12-12 DIAGNOSIS — J449 Chronic obstructive pulmonary disease, unspecified: Secondary | ICD-10-CM | POA: Diagnosis not present

## 2022-12-12 DIAGNOSIS — I700 Atherosclerosis of aorta: Secondary | ICD-10-CM | POA: Diagnosis not present

## 2022-12-12 DIAGNOSIS — F32A Depression, unspecified: Secondary | ICD-10-CM | POA: Diagnosis not present

## 2022-12-12 DIAGNOSIS — I739 Peripheral vascular disease, unspecified: Secondary | ICD-10-CM | POA: Diagnosis not present

## 2022-12-12 DIAGNOSIS — G3184 Mild cognitive impairment, so stated: Secondary | ICD-10-CM | POA: Diagnosis not present

## 2022-12-12 DIAGNOSIS — J9811 Atelectasis: Secondary | ICD-10-CM | POA: Diagnosis not present

## 2022-12-12 DIAGNOSIS — F419 Anxiety disorder, unspecified: Secondary | ICD-10-CM | POA: Diagnosis not present

## 2022-12-12 DIAGNOSIS — I10 Essential (primary) hypertension: Secondary | ICD-10-CM | POA: Diagnosis not present

## 2022-12-12 DIAGNOSIS — I2510 Atherosclerotic heart disease of native coronary artery without angina pectoris: Secondary | ICD-10-CM | POA: Diagnosis not present

## 2022-12-13 ENCOUNTER — Telehealth: Payer: Self-pay | Admitting: *Deleted

## 2022-12-13 NOTE — Telephone Encounter (Signed)
Transition Care Management Unsuccessful Follow-up Telephone Call  Date of discharge and from where:  [facility] 12/06/2022  Attempts:  1st Attempt  Reason for unsuccessful TCM follow-up call:  Left voice message

## 2022-12-14 ENCOUNTER — Telehealth: Payer: Self-pay | Admitting: *Deleted

## 2022-12-14 NOTE — Telephone Encounter (Signed)
Transition Care Management Unsuccessful Follow-up Telephone Call  Date of discharge and from where:  [facility] 6/22  Attempts:  2nd Attempt  Reason for unsuccessful TCM follow-up call:  Left voice message

## 2022-12-19 DIAGNOSIS — I700 Atherosclerosis of aorta: Secondary | ICD-10-CM | POA: Diagnosis not present

## 2022-12-19 DIAGNOSIS — I2510 Atherosclerotic heart disease of native coronary artery without angina pectoris: Secondary | ICD-10-CM | POA: Diagnosis not present

## 2022-12-19 DIAGNOSIS — R64 Cachexia: Secondary | ICD-10-CM | POA: Diagnosis not present

## 2022-12-19 DIAGNOSIS — R7989 Other specified abnormal findings of blood chemistry: Secondary | ICD-10-CM | POA: Diagnosis not present

## 2022-12-19 DIAGNOSIS — F32A Depression, unspecified: Secondary | ICD-10-CM | POA: Diagnosis not present

## 2022-12-19 DIAGNOSIS — J9811 Atelectasis: Secondary | ICD-10-CM | POA: Diagnosis not present

## 2022-12-19 DIAGNOSIS — J449 Chronic obstructive pulmonary disease, unspecified: Secondary | ICD-10-CM | POA: Diagnosis not present

## 2022-12-19 DIAGNOSIS — I739 Peripheral vascular disease, unspecified: Secondary | ICD-10-CM | POA: Diagnosis not present

## 2022-12-19 DIAGNOSIS — G3184 Mild cognitive impairment, so stated: Secondary | ICD-10-CM | POA: Diagnosis not present

## 2022-12-19 DIAGNOSIS — I10 Essential (primary) hypertension: Secondary | ICD-10-CM | POA: Diagnosis not present

## 2022-12-19 DIAGNOSIS — F419 Anxiety disorder, unspecified: Secondary | ICD-10-CM | POA: Diagnosis not present

## 2022-12-23 ENCOUNTER — Ambulatory Visit: Payer: No Typology Code available for payment source | Admitting: Cardiology

## 2022-12-23 ENCOUNTER — Encounter: Payer: Self-pay | Admitting: Cardiology

## 2022-12-23 VITALS — BP 170/73 | HR 93 | Resp 16 | Ht 60.0 in | Wt <= 1120 oz

## 2022-12-23 DIAGNOSIS — R0609 Other forms of dyspnea: Secondary | ICD-10-CM

## 2022-12-23 DIAGNOSIS — R0989 Other specified symptoms and signs involving the circulatory and respiratory systems: Secondary | ICD-10-CM | POA: Diagnosis not present

## 2022-12-23 NOTE — Progress Notes (Signed)
Patient referred by Lewis Moccasin, MD for heart failure  Subjective:   Crystal Noble, female    DOB: [birth year], 77 y.o.   MRN: [id number]   Chief Complaint  Patient presents with   Shortness of Breath   Follow-up    4 week    HPI  77 y.o. Caucasian female with CAD (noted on CT chest), asymptomatic carotid artery disease, COPD, tobacco dependence, exertional dyspnea, weight loss.   Patient has not undergone echocardiogram recommended by visit.  She continues to have exertional dyspnea.  She was recently seen in the emergency room for failure to thrive, but did not get admitted.     Current Outpatient Medications:    amLODipine (NORVASC) 5 MG tablet, Take 1 tablet (5 mg total) by mouth daily., Disp: 30 tablet, Rfl: 0   ARIPiprazole (ABILIFY) 20 MG tablet, Take 20 mg by mouth daily., Disp: , Rfl:    busPIRone (BUSPAR) 10 MG tablet, Take 20 mg by mouth daily., Disp: , Rfl:    Cholecalciferol (VITAMIN D3 ULTRA STRENGTH PO), Take 1 tablet by mouth daily., Disp: , Rfl:    escitalopram (LEXAPRO) 20 MG tablet, Take 20 mg by mouth daily., Disp: , Rfl:    rosuvastatin (CRESTOR) 10 MG tablet, Take 10 mg by mouth at bedtime., Disp: , Rfl:    TRELEGY ELLIPTA 200-62.5-25 MCG/ACT AEPB, Take 1 puff by mouth daily., Disp: , Rfl:    valsartan-hydrochlorothiazide (DIOVAN-HCT) 320-25 MG tablet, Take 1 tablet by mouth every morning., Disp: , Rfl:    Cardiovascular and other pertinent studies:  Reviewed external labs and tests, independently interpreted  EKG 11/10/2022: Sinus rhythm 91 bpm Left atrial enlargement LBBB  Anteroseptal infarct -age undetermined  CTA chest 12/06/2022: 1. No evidence of pulmonary embolus. 2. No acute intrathoracic, intra-abdominal, or intrapelvic process. 3. Aortic Atherosclerosis (ICD10-I70.0) and Emphysema (ICD10-J43.9). 4. Coronary artery atherosclerosis.    Echocardiogram 04/10/2019: Left ventricle cavity is normal in size. Normal left  ventricular wall thickness. Normal LV systolic function with EF 55%. Normal global wall motion. Diastolic function assessment limited due to degree of mitral annular calcification. Moderate calcification of the mitral valve annulus. Mild to moderate mitral regurgitation. Moderate tricuspid regurgitation. Estimated pulmonary artery systolic pressure is 29 mmHg.   Lexiscan Tetrofosmin Stress Test  04/28/2019: Non-diagnostic stress EKG. Mod Bruce protocol with lexiscan protocol used.  Normal myocardial perfusion. All segments of left ventricle demonstrated normal wall motion and thickening. Stress LV EF is normal 70%.  No previous exam available for comparison. Low risk study.   Carotid US 04/10/2019: Minimal stenosis in the right internal carotid artery (1-15%). Stenosis in the left internal carotid artery (50-69%). Antegrade right vertebral artery flow. Antegrade left vertebral artery flow. Compared to external report review, no significant change from 11/21/2017. Follow up in six months is appropriate if clinically indicated.   Echocardiogram 04/10/2019: Left ventricle cavity is normal in size. Normal left ventricular wall thickness. Normal LV systolic function with EF 55%. Normal global wall motion. Diastolic function assessment limited due to degree of mitral annular calcification.  Moderate calcification of the mitral valve annulus. Mild to moderate mitral regurgitation. Moderate tricuspid regurgitation. Estimated pulmonary artery systolic pressure is 29 mmHg.   EKG 03/27/2019: Sinus rhythm 97 bpm. Biatrial enlargement. cannot exclude old anteroseptal infarct.    CT chest 03/01/2019: Two vessel coronary atherosclerosis. Aortic atherosclerosis.  Mitral annular calcification. Emphysema. Hepatic steatosis.    Carotid US 11/2017: Right Carotid: Velocities in the right ICA are consistent with  a 1-39% stenosis. Left Carotid: Velocities in the left ICA are consistent with a 40-59%  stenosis.               The ECA appears >50% stenosed. Vertebrals:  Bilateral vertebral arteries demonstrate antegrade flow. Subclavians: Normal flow hemodynamics were seen in bilateral subclavian arteries.    Recent labs: 12/06/2022: Glucose 103, BUN/Cr 47/1.51. EGFR 36. Na/K 139/4.1. Rest of the CMP normal H/H 10.7/33.1. MCV 98. Platelets 259 HbA1C 5.9% TSH 0.8 normal  06/2020: Chol 136, TG 46, HDL 5, LDL 70    Review of Systems  Constitutional: Positive for weight loss.       Feels shaky  Cardiovascular:  Negative for chest pain, dyspnea on exertion, leg swelling, palpitations and syncope.         Vitals:   12/23/22 1305  BP: (!) 170/73  Pulse: 93  Resp: 16  SpO2: 100%     Body mass index is 13.67 kg/m. Filed Weights   12/23/22 1305  Weight: 70 lb (31.8 kg)     Objective:   Physical Exam Vitals and nursing note reviewed.  Constitutional:      General: She is not in acute distress.    Appearance: She is cachectic.  Neck:     Vascular: No JVD.  Cardiovascular:     Rate and Rhythm: Regular rhythm. Tachycardia present.     Pulses:          Carotid pulses are  on the right side with bruit.    Heart sounds: Murmur heard.     Harsh holosystolic murmur is present with a grade of 2/6 at the upper right sternal border radiating to the neck.  Pulmonary:     Effort: Pulmonary effort is normal.     Breath sounds: Normal breath sounds. No wheezing or rales.  Musculoskeletal:     Right lower leg: No edema.     Left lower leg: No edema.         Visit diagnoses:   ICD-10-CM   1. Dyspnea on exertion  R06.09 PCV ECHOCARDIOGRAM COMPLETE    2. Bruit of right carotid artery  R09.89 PCV CAROTID DUPLEX (BILATERAL)        Orders Placed This Encounter  Procedures   PCV ECHOCARDIOGRAM COMPLETE   PCV CAROTID DUPLEX (BILATERAL)        Assessment & Recommendations:    77 y.o. Caucasian female with CAD (noted on CT chest), asymptomatic carotid artery  disease, COPD, tobacco dependence, exertional dyspnea, weight loss.   Exertional dyspnea: Multifactorial, strongly suspect COPD.  She also has loud systolic murmur.  Echocardiogram pending.  Will also obtain carotid ultrasound given carotid bruit.  Further recommendations after above testing.   Thank you for referring the patient to [place]. Please feel free to contact with any questions.   Elder Negus, MD Pager: [phone] Office: [phone]

## 2022-12-27 ENCOUNTER — Ambulatory Visit: Payer: Self-pay

## 2022-12-27 NOTE — Patient Outreach (Signed)
Care Coordination   Follow Up Visit Note   12/27/2022 Name: PHOENICIA PIRIE MRN: [id number] DOB: 1946/04/21  REESHA DEBES is a 77 y.o. year old female who sees Lewis Moccasin, MD for primary care. I spoke with  [name] by phone today.  What matters to the patients health and wellness today?  Patient will continue to increase her daily caloric intake to help with weight gain. She would like to f/u with a lung doctor and GI doctor as recommended.     Goals Addressed             This Visit's Progress    Increase daily protein intake       Care Coordination Interventions: Evaluation of current treatment plan related to unintentional weight loss  and patient's adherence to plan as established by provider Reviewed and discussed with patient a recent ED visit for weight loss/failure to thrive Review of patient status, including review of consultant's reports, relevant laboratory and other test results Determined patient was not admitted for purposes of TPN administration due to her hemodynamics were stable and she reports making efforts to increase her oral protein intake, she has gained 2 lbs Reviewed and discussed with patient recommendations for a GI consult for evaluation of weight loss and reviewed with patient a new patient appointment is scheduled for 02/20/23 @10 :30 AM with Legrand Como, PA-C, patient acknowledges the visit and plans to keep this appointment  Encouraged patient to continue to implement 3-5 meals per day with protein snacks in between and reinforced the importance of increasing her overall caloric intake as directed  Body mass index is 13.67 kg/m.    Filed Weight    12/23/22 1300  Weight: 70 lb (31.8 kg)   Body mass index is 13.28 kg/m.    Filed Weight    11/10/22 1407  Weight: 68 lb (30.8 kg)        To start using inhalers as prescribed to improve shortness of breath       Care Coordination Interventions: Evaluation of  current treatment plan related to COPD and patient's adherence to plan as established by provider Reviewed and discussed with patient her recent ED visit for failure to thrive  Review of patient status, including review of consultant's reports, relevant laboratory and other test results Discussed recommendations for Pulmonology referral for further evaluation/treatment of COPD Determined the hospital referral was denied due to "not enough information" Placed outbound call to PCP office requesting Dr. Duanne Guess be made of aware of the denied referral and requested she place a new Pulmonology referral  Provided the contact number for this nurse care coordinator and requested a call back to advise and or if further assistance is needed for care coordination Reviewed upcoming scheduled Echocardiogram with patient and confirmed patient will keep her appointments as scheduled      Interventions Today    Flowsheet Row Most Recent Value  Chronic Disease   Chronic disease during today's visit Chronic Obstructive Pulmonary Disease (COPD), Other  [weight loss]  General Interventions   General Interventions Discussed/Reviewed General Interventions Discussed, General Interventions Reviewed, Doctor Visits, Communication with  Doctor Visits Discussed/Reviewed Doctor Visits Discussed, Doctor Visits Reviewed, Specialist  PCP/Specialist Visits Contact provider for referral to  [facility] provider for referral to Specialist  [Pulmonologist]  Communication with PCP/Specialists  Education Interventions   Education Provided Provided Education  Provided Verbal Education On Nutrition, When to see the doctor  Nutrition Interventions   Nutrition Discussed/Reviewed Nutrition Discussed,  Nutrition Reviewed, Increasing proteins, Supplemental nutrition          SDOH assessments and interventions completed:  No     Care Coordination Interventions:  Yes, provided   Follow up plan: Follow up call scheduled for  01/10/23 @12 :30 PM    Encounter Outcome:  Pt. Visit Completed

## 2022-12-27 NOTE — Patient Instructions (Signed)
Visit Information  Thank you for taking time to visit with me today. Please don't hesitate to contact me if I can be of assistance to you.   Following are the goals we discussed today:   Goals Addressed             This Visit's Progress    Increase daily protein intake       Care Coordination Interventions: Evaluation of current treatment plan related to unintentional weight loss  and patient's adherence to plan as established by provider Reviewed and discussed with patient a recent ED visit for weight loss/failure to thrive Review of patient status, including review of consultant's reports, relevant laboratory and other test results Determined patient was not admitted for purposes of TPN administration due to her hemodynamics were stable and she reports making efforts to increase her oral protein intake, she has gained 2 lbs Reviewed and discussed with patient recommendations for a GI consult for evaluation of weight loss and reviewed with patient a new patient appointment is scheduled for 02/20/23 @10 :30 AM with Legrand Como, PA-C, patient acknowledges the visit and plans to keep this appointment  Encouraged patient to continue to implement 3-5 meals per day with protein snacks in between and reinforced the importance of increasing her overall caloric intake as directed  Body mass index is 13.67 kg/m.    Filed Weight    12/23/22 1300  Weight: 70 lb (31.8 kg)   Body mass index is 13.28 kg/m.    Filed Weight    11/10/22 1407  Weight: 68 lb (30.8 kg)        To start using inhalers as prescribed to improve shortness of breath       Care Coordination Interventions: Evaluation of current treatment plan related to COPD and patient's adherence to plan as established by provider Reviewed and discussed with patient her recent ED visit for failure to thrive  Review of patient status, including review of consultant's reports, relevant laboratory and other test results Discussed  recommendations for Pulmonology referral for further evaluation/treatment of COPD Determined the hospital referral was denied due to "not enough information" Placed outbound call to PCP office requesting Dr. Duanne Guess be made of aware of the denied referral and requested she place a new Pulmonology referral  Provided the contact number for this nurse care coordinator and requested a call back to advise and or if further assistance is needed for care coordination Reviewed upcoming scheduled Echocardiogram with patient and confirmed patient will keep her appointments as scheduled         Our next appointment is by telephone on 01/10/23 at 12:30 PM  Please call the care guide team at [phone] if you need to cancel or reschedule your appointment.   If you are experiencing a Mental Health or Behavioral Health Crisis or need someone to talk to, please call 1-800-273-TALK (toll free, 24 hour hotline)  The patient verbalized understanding of instructions, educational materials, and care plan provided today and DECLINED offer to receive copy of patient instructions, educational materials, and care plan.

## 2023-01-03 DIAGNOSIS — J449 Chronic obstructive pulmonary disease, unspecified: Secondary | ICD-10-CM | POA: Diagnosis not present

## 2023-01-03 DIAGNOSIS — R64 Cachexia: Secondary | ICD-10-CM | POA: Diagnosis not present

## 2023-01-03 DIAGNOSIS — I2510 Atherosclerotic heart disease of native coronary artery without angina pectoris: Secondary | ICD-10-CM | POA: Diagnosis not present

## 2023-01-03 DIAGNOSIS — I10 Essential (primary) hypertension: Secondary | ICD-10-CM | POA: Diagnosis not present

## 2023-01-05 ENCOUNTER — Ambulatory Visit: Payer: Medicare HMO

## 2023-01-05 DIAGNOSIS — R0609 Other forms of dyspnea: Secondary | ICD-10-CM

## 2023-01-05 DIAGNOSIS — R0989 Other specified symptoms and signs involving the circulatory and respiratory systems: Secondary | ICD-10-CM

## 2023-01-06 ENCOUNTER — Ambulatory Visit: Payer: Self-pay | Admitting: Licensed Clinical Social Worker

## 2023-01-09 NOTE — Patient Outreach (Signed)
  Care Coordination   Follow Up Visit Note   01/06/2023 Name: Crystal Noble MRN: [id number] DOB: 10-04-1945  Crystal Noble is a 77 y.o. year old female who sees Brandom Kerwin Moccasin, MD for primary care. I spoke with  [name] by phone today.  What matters to the patients health and wellness today?  Symptom Management and recent ED visit    Goals Addressed             This Visit's Progress    LCSW Plan of Care-Obtain Supportive Resources   On track    Activities and task to complete in order to accomplish goals.   Keep all upcoming appointments discussed today Continue with compliance of taking medication prescribed by Doctor Implement healthy coping skills discussed to assist with management of symptoms         SDOH assessments and interventions completed:  Yes  SDOH Interventions Today    Flowsheet Row Most Recent Value  SDOH Interventions   Housing Interventions Intervention Not Indicated        Care Coordination Interventions:  Yes, provided  Interventions Today    Flowsheet Row Most Recent Value  Chronic Disease   Chronic disease during today's visit Hypertension (HTN), Chronic Obstructive Pulmonary Disease (COPD), Other  [Anxiety]  General Interventions   General Interventions Discussed/Reviewed General Interventions Reviewed, Doctor Visits  Doctor Visits Discussed/Reviewed Doctor Visits Reviewed  Mental Health Interventions   Mental Health Discussed/Reviewed Mental Health Reviewed, Coping Strategies, Anxiety  Nutrition Interventions   Nutrition Discussed/Reviewed Nutrition Reviewed  Pharmacy Interventions   Pharmacy Dicussed/Reviewed Pharmacy Topics Reviewed       Follow up plan: Follow up call scheduled for 4-6 weeks    Encounter Outcome:  Pt. Visit Completed   Jenel Lucks, MSW, LCSW [facility] Care Management [facility]  [facility] [email or web address] Phone [phone] 9:27 AM

## 2023-01-10 ENCOUNTER — Ambulatory Visit: Payer: Self-pay

## 2023-01-10 NOTE — Patient Outreach (Signed)
  Care Coordination   01/10/2023 Name: Crystal Noble MRN: [id number] DOB: 05-Jan-1946   Care Coordination Outreach Attempts:  An unsuccessful telephone outreach was attempted for a scheduled appointment today.  Follow Up Plan:  Additional outreach attempts will be made to offer the patient care coordination information and services.   Encounter Outcome:  No Answer   Care Coordination Interventions:  No, not indicated    Delsa Sale, RN, BSN, CCM Care Management Coordinator [facility] Care Management  Direct Phone: [phone]

## 2023-01-16 DIAGNOSIS — E785 Hyperlipidemia, unspecified: Secondary | ICD-10-CM | POA: Diagnosis not present

## 2023-01-16 DIAGNOSIS — I10 Essential (primary) hypertension: Secondary | ICD-10-CM | POA: Diagnosis not present

## 2023-01-16 DIAGNOSIS — E559 Vitamin D deficiency, unspecified: Secondary | ICD-10-CM | POA: Diagnosis not present

## 2023-01-16 DIAGNOSIS — R7989 Other specified abnormal findings of blood chemistry: Secondary | ICD-10-CM | POA: Diagnosis not present

## 2023-01-18 DIAGNOSIS — J449 Chronic obstructive pulmonary disease, unspecified: Secondary | ICD-10-CM | POA: Diagnosis not present

## 2023-01-18 DIAGNOSIS — I10 Essential (primary) hypertension: Secondary | ICD-10-CM | POA: Diagnosis not present

## 2023-01-18 DIAGNOSIS — Z789 Other specified health status: Secondary | ICD-10-CM | POA: Diagnosis not present

## 2023-01-18 DIAGNOSIS — R7989 Other specified abnormal findings of blood chemistry: Secondary | ICD-10-CM | POA: Diagnosis not present

## 2023-01-18 DIAGNOSIS — I700 Atherosclerosis of aorta: Secondary | ICD-10-CM | POA: Diagnosis not present

## 2023-01-18 DIAGNOSIS — G4700 Insomnia, unspecified: Secondary | ICD-10-CM | POA: Diagnosis not present

## 2023-01-18 DIAGNOSIS — E559 Vitamin D deficiency, unspecified: Secondary | ICD-10-CM | POA: Diagnosis not present

## 2023-01-18 DIAGNOSIS — F331 Major depressive disorder, recurrent, moderate: Secondary | ICD-10-CM | POA: Diagnosis not present

## 2023-01-18 DIAGNOSIS — E782 Mixed hyperlipidemia: Secondary | ICD-10-CM | POA: Diagnosis not present

## 2023-01-18 DIAGNOSIS — F411 Generalized anxiety disorder: Secondary | ICD-10-CM | POA: Diagnosis not present

## 2023-01-23 ENCOUNTER — Other Ambulatory Visit (HOSPITAL_BASED_OUTPATIENT_CLINIC_OR_DEPARTMENT_OTHER): Payer: Self-pay | Admitting: Family Medicine

## 2023-01-23 ENCOUNTER — Ambulatory Visit (HOSPITAL_BASED_OUTPATIENT_CLINIC_OR_DEPARTMENT_OTHER)
Admission: RE | Admit: 2023-01-23 | Discharge: 2023-01-23 | Disposition: A | Discharge status: Home / Self Care | Payer: Medicare HMO | Source: Ambulatory Visit | Attending: Family Medicine | Admitting: Family Medicine

## 2023-01-23 DIAGNOSIS — I3481 Nonrheumatic mitral (valve) annulus calcification: Secondary | ICD-10-CM | POA: Diagnosis not present

## 2023-01-23 DIAGNOSIS — R946 Abnormal results of thyroid function studies: Secondary | ICD-10-CM

## 2023-01-23 DIAGNOSIS — E0590 Thyrotoxicosis, unspecified without thyrotoxic crisis or storm: Secondary | ICD-10-CM | POA: Diagnosis not present

## 2023-01-23 DIAGNOSIS — E041 Nontoxic single thyroid nodule: Secondary | ICD-10-CM | POA: Diagnosis not present

## 2023-01-23 DIAGNOSIS — Z23 Encounter for immunization: Secondary | ICD-10-CM | POA: Diagnosis not present

## 2023-01-23 DIAGNOSIS — F411 Generalized anxiety disorder: Secondary | ICD-10-CM | POA: Diagnosis not present

## 2023-01-23 DIAGNOSIS — R64 Cachexia: Secondary | ICD-10-CM | POA: Diagnosis not present

## 2023-01-25 ENCOUNTER — Ambulatory Visit: Payer: No Typology Code available for payment source | Admitting: Cardiology

## 2023-01-25 ENCOUNTER — Encounter: Payer: Self-pay | Admitting: Cardiology

## 2023-01-27 ENCOUNTER — Other Ambulatory Visit: Payer: Self-pay | Admitting: Family Medicine

## 2023-01-27 DIAGNOSIS — E041 Nontoxic single thyroid nodule: Secondary | ICD-10-CM

## 2023-01-30 ENCOUNTER — Telehealth: Payer: Self-pay | Admitting: *Deleted

## 2023-01-30 DIAGNOSIS — E0590 Thyrotoxicosis, unspecified without thyrotoxic crisis or storm: Secondary | ICD-10-CM | POA: Diagnosis not present

## 2023-01-30 DIAGNOSIS — R5383 Other fatigue: Secondary | ICD-10-CM | POA: Diagnosis not present

## 2023-01-30 NOTE — Progress Notes (Unsigned)
  Care Coordination Note  01/30/2023 Name: [name] MRN: [id number] DOB: 1946-02-09  Crystal Noble is a 77 y.o. year old female who is a primary care patient of Lewis Moccasin, MD and is actively engaged with the care management team. I reached out to [name] by phone today to assist with re-scheduling a follow up visit with the RN Case Manager  Follow up plan: Unsuccessful telephone outreach attempt made. A HIPAA compliant phone message was left for the patient providing contact information and requesting a return call.   [facility]  Care Coordination Care Guide  Direct Dial: [phone]

## 2023-01-31 NOTE — Progress Notes (Signed)
  Care Coordination Note  01/31/2023 Name: Crystal Noble MRN: [id number] DOB: 02/22/1946  Crystal Noble is a 77 y.o. year old female who is a primary care patient of Lewis Moccasin, MD and is actively engaged with the care management team. I reached out to [name] by phone today to assist with re-scheduling a follow up visit with the RN Case Manager  Follow up plan: Telephone appointment with care management team member scheduled for:02/03/23  [facility] Coordination Care Guide  Direct Dial: [phone]

## 2023-02-01 ENCOUNTER — Encounter: Payer: No Typology Code available for payment source | Admitting: Cardiology

## 2023-02-01 NOTE — Progress Notes (Deleted)
Patient referred by Lewis Moccasin, MD for heart failure  Subjective:   Crystal Noble, female    DOB: 25-Jun-1945, 77 y.o.   MRN: [id number]   Chief Complaint  Patient presents with   Dyspnea on exertion   Asymptomatic stenosis of both carotid arteries without infa   Follow-up    4-6 weeks    HPI  77 y.o. Caucasian female with CAD (noted on CT chest), asymptomatic carotid artery disease, COPD, tobacco dependence, exertional dyspnea, weight loss.   Patient has not undergone echocardiogram recommended by visit.  She continues to have exertional dyspnea.  She was recently seen in the emergency room for failure to thrive, but did not get admitted.     Current Outpatient Medications:    amLODipine (NORVASC) 5 MG tablet, Take 1 tablet (5 mg total) by mouth daily., Disp: 30 tablet, Rfl: 0   ARIPiprazole (ABILIFY) 20 MG tablet, Take 20 mg by mouth daily., Disp: , Rfl:    busPIRone (BUSPAR) 10 MG tablet, Take 20 mg by mouth daily., Disp: , Rfl:    Cholecalciferol (VITAMIN D3 ULTRA STRENGTH PO), Take 1 tablet by mouth daily., Disp: , Rfl:    escitalopram (LEXAPRO) 20 MG tablet, Take 20 mg by mouth daily., Disp: , Rfl:    rosuvastatin (CRESTOR) 10 MG tablet, Take 10 mg by mouth at bedtime., Disp: , Rfl:    TRELEGY ELLIPTA 200-62.5-25 MCG/ACT AEPB, Take 1 puff by mouth daily., Disp: , Rfl:    valsartan-hydrochlorothiazide (DIOVAN-HCT) 320-25 MG tablet, Take 1 tablet by mouth every morning., Disp: , Rfl:    Cardiovascular and other pertinent studies:  Reviewed external labs and tests, independently interpreted  EKG 11/10/2022: Sinus rhythm 91 bpm Left atrial enlargement LBBB  Anteroseptal infarct -age undetermined  Echocardiogram 01/05/2023: Left ventricle cavity is normal in size. Mild concentric hypertrophy of the left ventricle. Normal global wall motion. Low normal LVEF 50-55%. Diastolic function not assessed due to severity of mitral annual calcification. Left  atrial cavity is severely dilated at 53.9 ml/m^2. Severe calcification of the mitral valve annulus. Mild mitral valve leaflet thickening. Moderate (Grade II) mitral regurgitation. Mild tricuspid regurgitation. No evidence of pulmonary hypertension. Compared to previous study in 2020, MR has increased in severity from mild, LA dilatation is new.   CTA chest 12/06/2022: 1. No evidence of pulmonary embolus. 2. No acute intrathoracic, intra-abdominal, or intrapelvic process. 3. Aortic Atherosclerosis (ICD10-I70.0) and Emphysema (ICD10-J43.9). 4. Coronary artery atherosclerosis.    Echocardiogram 04/10/2019: Left ventricle cavity is normal in size. Normal left ventricular wall thickness. Normal LV systolic function with EF 55%. Normal global wall motion. Diastolic function assessment limited due to degree of mitral annular calcification. Moderate calcification of the mitral valve annulus. Mild to moderate mitral regurgitation. Moderate tricuspid regurgitation. Estimated pulmonary artery systolic pressure is 29 mmHg.   Lexiscan Tetrofosmin Stress Test  04/28/2019: Non-diagnostic stress EKG. Mod Bruce protocol with lexiscan protocol used.  Normal myocardial perfusion. All segments of left ventricle demonstrated normal wall motion and thickening. Stress LV EF is normal 70%.  No previous exam available for comparison. Low risk study.   Carotid US 04/10/2019: Minimal stenosis in the right internal carotid artery (1-15%). Stenosis in the left internal carotid artery (50-69%). Antegrade right vertebral artery flow. Antegrade left vertebral artery flow. Compared to external report review, no significant change from 11/21/2017. Follow up in six months is appropriate if clinically indicated.   Echocardiogram 04/10/2019: Left ventricle cavity is normal in size. Normal left ventricular  wall thickness. Normal LV systolic function with EF 55%. Normal global wall motion. Diastolic function assessment  limited due to degree of mitral annular calcification.  Moderate calcification of the mitral valve annulus. Mild to moderate mitral regurgitation. Moderate tricuspid regurgitation. Estimated pulmonary artery systolic pressure is 29 mmHg.   EKG 03/27/2019: Sinus rhythm 97 bpm. Biatrial enlargement. cannot exclude old anteroseptal infarct.    CT chest 03/01/2019: Two vessel coronary atherosclerosis. Aortic atherosclerosis.  Mitral annular calcification. Emphysema. Hepatic steatosis.    Carotid US 11/2017: Right Carotid: Velocities in the right ICA are consistent with a 1-39% stenosis. Left Carotid: Velocities in the left ICA are consistent with a 40-59% stenosis.               The ECA appears >50% stenosed. Vertebrals:  Bilateral vertebral arteries demonstrate antegrade flow. Subclavians: Normal flow hemodynamics were seen in bilateral subclavian arteries.    Recent labs: 12/06/2022: Glucose 103, BUN/Cr 47/1.51. EGFR 36. Na/K 139/4.1. Rest of the CMP normal H/H 10.7/33.1. MCV 98. Platelets 259 HbA1C 5.9% TSH 0.8 normal  06/2020: Chol 136, TG 46, HDL 5, LDL 70    Review of Systems  Constitutional: Positive for weight loss.       Feels shaky  Cardiovascular:  Negative for chest pain, dyspnea on exertion, leg swelling, palpitations and syncope.         There were no vitals filed for this visit.    There is no height or weight on file to calculate BMI. There were no vitals filed for this visit.    Objective:   Physical Exam Vitals and nursing note reviewed.  Constitutional:      General: She is not in acute distress.    Appearance: She is cachectic.  Neck:     Vascular: No JVD.  Cardiovascular:     Rate and Rhythm: Regular rhythm. Tachycardia present.     Pulses:          Carotid pulses are  on the right side with bruit.    Heart sounds: Murmur heard.     Harsh holosystolic murmur is present with a grade of 2/6 at the upper right sternal border radiating to  the neck.  Pulmonary:     Effort: Pulmonary effort is normal.     Breath sounds: Normal breath sounds. No wheezing or rales.  Musculoskeletal:     Right lower leg: No edema.     Left lower leg: No edema.         Visit diagnoses: No diagnosis found.     No orders of the defined types were placed in this encounter.       Assessment & Recommendations:    77 y.o. Caucasian female with CAD (noted on CT chest), asymptomatic carotid artery disease, COPD, tobacco dependence, exertional dyspnea, weight loss.   Exertional dyspnea: Multifactorial, strongly suspect COPD.  She also has loud systolic murmur.  Echocardiogram pending.  Will also obtain carotid ultrasound given carotid bruit.  Further recommendations after above testing.   Thank you for referring the patient to [place]. Please feel free to contact with any questions.   Elder Negus, MD Pager: [phone] Office: [phone]

## 2023-02-03 ENCOUNTER — Ambulatory Visit: Payer: Self-pay

## 2023-02-03 NOTE — Patient Instructions (Signed)
Visit Information  Thank you for taking time to visit with me today. Please don't hesitate to contact me if I can be of assistance to you.   Following are the goals we discussed today:   Goals Addressed             This Visit's Progress    To have thyroid biospy completed       Care Coordination Interventions: Evaluation of current treatment plan related to hyperthyroidism and patient's adherence to plan as established by provider Determined patient completed her thyroid US as directed Noted patient is scheduled for a thyroid biopsy on 03/20/23 @3 :15 PM         Our next appointment is by telephone on 02/08/23 at 1:30 PM   Please call the care guide team at [phone] if you need to cancel or reschedule your appointment.   If you are experiencing a Mental Health or Behavioral Health Crisis or need someone to talk to, please call 1-800-273-TALK (toll free, 24 hour hotline)  The patient verbalized understanding of instructions, educational materials, and care plan provided today and DECLINED offer to receive copy of patient instructions, educational materials, and care plan.   Delsa Sale, RN, BSN, CCM Care Management Coordinator [facility] Care Management Direct Phone: [phone]

## 2023-02-03 NOTE — Patient Outreach (Signed)
  Care Coordination   Follow Up Visit Note   02/03/2023 Name: Crystal Noble MRN: [id number] DOB: 03-08-1946  Crystal Noble is a 77 y.o. year old female who sees Lewis Moccasin, MD for primary care. I spoke with  [name] by phone today.  What matters to the patients health and wellness today?  Patient will keep her upcoming scheduled visit with Cardiology.     Goals Addressed             This Visit's Progress    To have thyroid biospy completed       Care Coordination Interventions: Evaluation of current treatment plan related to hyperthyroidism and patient's adherence to plan as established by provider Determined patient completed her thyroid US as directed Noted patient is scheduled for a thyroid biopsy on 03/20/23 @3 :15 PM         SDOH assessments and interventions completed:  No     Care Coordination Interventions:  Yes, provided   Follow up plan: Follow up call scheduled for 02/08/23 @1 :30 PM     Encounter Outcome:  Pt. Visit Completed

## 2023-02-06 ENCOUNTER — Encounter: Payer: Self-pay | Admitting: Cardiology

## 2023-02-06 ENCOUNTER — Ambulatory Visit: Payer: No Typology Code available for payment source | Admitting: Cardiology

## 2023-02-06 VITALS — BP 148/62 | HR 96 | Resp 16 | Ht 60.0 in | Wt <= 1120 oz

## 2023-02-06 DIAGNOSIS — J449 Chronic obstructive pulmonary disease, unspecified: Secondary | ICD-10-CM | POA: Diagnosis not present

## 2023-02-06 DIAGNOSIS — R0602 Shortness of breath: Secondary | ICD-10-CM | POA: Diagnosis not present

## 2023-02-06 DIAGNOSIS — R0609 Other forms of dyspnea: Secondary | ICD-10-CM | POA: Diagnosis not present

## 2023-02-06 DIAGNOSIS — I10 Essential (primary) hypertension: Secondary | ICD-10-CM | POA: Diagnosis not present

## 2023-02-06 DIAGNOSIS — R64 Cachexia: Secondary | ICD-10-CM | POA: Diagnosis not present

## 2023-02-06 DIAGNOSIS — I340 Nonrheumatic mitral (valve) insufficiency: Secondary | ICD-10-CM

## 2023-02-06 DIAGNOSIS — E0590 Thyrotoxicosis, unspecified without thyrotoxic crisis or storm: Secondary | ICD-10-CM | POA: Diagnosis not present

## 2023-02-06 NOTE — Progress Notes (Signed)
Patient referred by Lewis Moccasin, MD for heart failure  Subjective:   Crystal Noble, female    DOB: Mar 09, 1946, 77 y.o.   MRN: [id number]   Chief Complaint  Patient presents with   Shortness of Breath   Follow-up    4-6 week    HPI  77 y.o. Caucasian female with CAD (noted on CT chest), asymptomatic carotid artery disease, COPD, tobacco dependence, exertional dyspnea, weight loss.   Reviewed recent test results with the patient, details below. She continues to have exertional dyspnea, although not hypoxic at rest. Blood pressure elevated, but improved from prior.       Current Outpatient Medications:    amLODipine (NORVASC) 5 MG tablet, Take 1 tablet (5 mg total) by mouth daily., Disp: 30 tablet, Rfl: 0   ARIPiprazole (ABILIFY) 20 MG tablet, Take 20 mg by mouth daily., Disp: , Rfl:    busPIRone (BUSPAR) 10 MG tablet, Take 20 mg by mouth daily., Disp: , Rfl:    Cholecalciferol (VITAMIN D3 ULTRA STRENGTH PO), Take 1 tablet by mouth daily., Disp: , Rfl:    escitalopram (LEXAPRO) 20 MG tablet, Take 20 mg by mouth daily., Disp: , Rfl:    rosuvastatin (CRESTOR) 10 MG tablet, Take 10 mg by mouth at bedtime., Disp: , Rfl:    TRELEGY ELLIPTA 200-62.5-25 MCG/ACT AEPB, Take 1 puff by mouth daily., Disp: , Rfl:    valsartan-hydrochlorothiazide (DIOVAN-HCT) 320-25 MG tablet, Take 1 tablet by mouth every morning., Disp: , Rfl:    Cardiovascular and other pertinent studies:  Reviewed external labs and tests, independently interpreted  EKG 11/10/2022: Sinus rhythm 91 bpm Left atrial enlargement LBBB  Anteroseptal infarct -age undetermined  Echocardiogram 01/05/2023: Left ventricle cavity is normal in size. Mild concentric hypertrophy of the left ventricle. Normal global wall motion. Low normal LVEF 50-55%. Diastolic function not assessed due to severity of mitral annual calcification. Left atrial cavity is severely dilated at 53.9 ml/m^2. Severe calcification of the  mitral valve annulus. Mild mitral valve leaflet thickening. Moderate (Grade II) mitral regurgitation. Mild tricuspid regurgitation. No evidence of pulmonary hypertension. Compared to previous study in 2020, MR has increased in severity from mild, LA dilatation is new.   CTA chest 12/06/2022: 1. No evidence of pulmonary embolus. 2. No acute intrathoracic, intra-abdominal, or intrapelvic process. 3. Aortic Atherosclerosis (ICD10-I70.0) and Emphysema (ICD10-J43.9). 4. Coronary artery atherosclerosis.    Echocardiogram 04/10/2019: Left ventricle cavity is normal in size. Normal left ventricular wall thickness. Normal LV systolic function with EF 55%. Normal global wall motion. Diastolic function assessment limited due to degree of mitral annular calcification. Moderate calcification of the mitral valve annulus. Mild to moderate mitral regurgitation. Moderate tricuspid regurgitation. Estimated pulmonary artery systolic pressure is 29 mmHg.   Lexiscan Tetrofosmin Stress Test  04/28/2019: Non-diagnostic stress EKG. Mod Bruce protocol with lexiscan protocol used.  Normal myocardial perfusion. All segments of left ventricle demonstrated normal wall motion and thickening. Stress LV EF is normal 70%.  No previous exam available for comparison. Low risk study.   Carotid US 04/10/2019: Minimal stenosis in the right internal carotid artery (1-15%). Stenosis in the left internal carotid artery (50-69%). Antegrade right vertebral artery flow. Antegrade left vertebral artery flow. Compared to external report review, no significant change from 11/21/2017. Follow up in six months is appropriate if clinically indicated.   Echocardiogram 04/10/2019: Left ventricle cavity is normal in size. Normal left ventricular wall thickness. Normal LV systolic function with EF 55%. Normal global wall motion. Diastolic  function assessment limited due to degree of mitral annular calcification.  Moderate  calcification of the mitral valve annulus. Mild to moderate mitral regurgitation. Moderate tricuspid regurgitation. Estimated pulmonary artery systolic pressure is 29 mmHg.   EKG 03/27/2019: Sinus rhythm 97 bpm. Biatrial enlargement. cannot exclude old anteroseptal infarct.    CT chest 03/01/2019: Two vessel coronary atherosclerosis. Aortic atherosclerosis.  Mitral annular calcification. Emphysema. Hepatic steatosis.    Carotid US 11/2017: Right Carotid: Velocities in the right ICA are consistent with a 1-39% stenosis. Left Carotid: Velocities in the left ICA are consistent with a 40-59% stenosis.               The ECA appears >50% stenosed. Vertebrals:  Bilateral vertebral arteries demonstrate antegrade flow. Subclavians: Normal flow hemodynamics were seen in bilateral subclavian arteries.    Recent labs: 12/06/2022: Glucose 103, BUN/Cr 47/1.51. EGFR 36. Na/K 139/4.1. Rest of the CMP normal H/H 10.7/33.1. MCV 98. Platelets 259 HbA1C 5.9% TSH 0.8 normal  06/2020: Chol 136, TG 46, HDL 5, LDL 70    Review of Systems  Constitutional: Positive for weight loss.       Feels shaky  Cardiovascular:  Positive for dyspnea on exertion. Negative for chest pain, leg swelling, palpitations and syncope.         Vitals:   02/06/23 1250  BP: (!) 148/62  Pulse: 96  Resp: 16  SpO2: 97%      Body mass index is 13.67 kg/m. Filed Weights   02/06/23 1250  Weight: 70 lb (31.8 kg)      Objective:   Physical Exam Vitals and nursing note reviewed.  Constitutional:      General: She is not in acute distress.    Appearance: She is cachectic.  Neck:     Vascular: No JVD.  Cardiovascular:     Rate and Rhythm: Regular rhythm. Tachycardia present.     Pulses:          Carotid pulses are  on the right side with bruit.    Heart sounds: Murmur heard.     Harsh holosystolic murmur is present with a grade of 2/6 at the upper right sternal border radiating to the neck.  Pulmonary:      Effort: Pulmonary effort is normal.     Breath sounds: Normal breath sounds. No wheezing or rales.  Musculoskeletal:     Right lower leg: No edema.     Left lower leg: No edema.         Visit diagnoses:   ICD-10-CM   1. Dyspnea on exertion  R06.09     2. Nonrheumatic mitral valve regurgitation  I34.0            Assessment & Recommendations:    77 y.o. Caucasian female with CAD (noted on CT chest), asymptomatic carotid artery disease, COPD, tobacco dependence, exertional dyspnea, weight loss.   Exertional dyspnea: Multifactorial, strongly suspect COPD. She does have at least moderate MR. However, that would not explain her cachexia. Strongly recommend workup up for COPD. If COPD excluded and dyspnea remains, could consider further workup for MR.  F/u in 6 months  Thank you for referring the patient to [place]. Please feel free to contact with any questions.   Elder Negus, MD Pager: [phone] Office: [phone]

## 2023-02-08 ENCOUNTER — Ambulatory Visit: Payer: Self-pay

## 2023-02-08 NOTE — Patient Instructions (Signed)
Visit Information  Thank you for taking time to visit with me today. Please don't hesitate to contact me if I can be of assistance to you.   Following are the goals we discussed today:   Goals Addressed             This Visit's Progress    To get established with a Pulmonologist for evaluation and treatment of COPD       Care Coordination Interventions: Evaluation of current treatment plan related to COPD and patient's adherence to plan as established by provider Reviewed with patient her recent follow up with Cardiology for evaluation of ongoing dyspnea Review of patient status, including review of consultant's reports, relevant laboratory and other test results, and medications completed Exertional dyspnea: Multifactorial, strongly suspect COPD. She does have at least moderate MR. However, that would not explain her cachexia. Strongly recommend workup up for COPD. If COPD excluded and dyspnea remains, could consider further workup for MR. F/u in 6 months Discussed with patient the past Pulmonologist referral placed in June 2024 was denied due to not having enough information, determined patient declines wanting to ask her Cardiologist for the referral, she will discuss this with her PCP at her next scheduled follow up Reviewed and discussed with patient her next scheduled follow up with Dr. Rosemary Holms        Our next appointment is by telephone on 03/08/23 at 1:30 PM   Please call the care guide team at [phone] if you need to cancel or reschedule your appointment.   If you are experiencing a Mental Health or Behavioral Health Crisis or need someone to talk to, please call 1-800-273-TALK (toll free, 24 hour hotline)  The patient verbalized understanding of instructions, educational materials, and care plan provided today and DECLINED offer to receive copy of patient instructions, educational materials, and care plan.   Delsa Sale, RN, BSN, CCM Care Management  Coordinator [facility] Care Management Direct Phone: [phone]

## 2023-02-08 NOTE — Patient Outreach (Signed)
  Care Coordination   Follow Up Visit Note   02/08/2023 Name: Crystal Noble MRN: [id number] DOB: 08-Jan-1946  Crystal Noble is a 77 y.o. year old female who sees Lewis Moccasin, MD for primary care. I spoke with  [name] by phone today.  What matters to the patients health and wellness today?  Patient would like to ask her PCP for a new Pulmonology referral at next scheduled visit.     Goals Addressed             This Visit's Progress    To get established with a Pulmonologist for evaluation and treatment of COPD       Care Coordination Interventions: Evaluation of current treatment plan related to COPD and patient's adherence to plan as established by provider Reviewed with patient her recent follow up with Cardiology for evaluation of ongoing dyspnea Review of patient status, including review of consultant's reports, relevant laboratory and other test results, and medications completed Exertional dyspnea: Multifactorial, strongly suspect COPD. She does have at least moderate MR. However, that would not explain her cachexia. Strongly recommend workup up for COPD. If COPD excluded and dyspnea remains, could consider further workup for MR. F/u in 6 months Discussed with patient the past Pulmonologist referral placed in June 2024 was denied due to not having enough information, determined patient declines wanting to ask her Cardiologist for the referral, she will discuss this with her PCP at her next scheduled follow up Reviewed and discussed with patient her next scheduled follow up with Dr. Rosemary Holms    Interventions Today    Flowsheet Row Most Recent Value  Chronic Disease   Chronic disease during today's visit Chronic Obstructive Pulmonary Disease (COPD)  General Interventions   General Interventions Discussed/Reviewed General Interventions Discussed, General Interventions Reviewed, Doctor Visits  Doctor Visits Discussed/Reviewed Doctor Visits  Discussed, Doctor Visits Reviewed, Specialist  PCP/Specialist Visits Contact provider for referral to  Contacted provider for referral to PCP  [Pulmonology referral]  Education Interventions   Education Provided Provided Education  Provided Verbal Education On When to see the doctor          SDOH assessments and interventions completed:  No     Care Coordination Interventions:  Yes, provided   Follow up plan: Follow up call scheduled for 03/08/23 @1 :30 PM    Encounter Outcome:  Pt. Visit Completed

## 2023-02-10 ENCOUNTER — Ambulatory Visit: Payer: Self-pay | Admitting: Licensed Clinical Social Worker

## 2023-02-10 NOTE — Patient Outreach (Signed)
  Care Coordination   Follow Up Visit Note   02/10/2023 Name: Crystal Noble MRN: [id number] DOB: May 23, 1946  Crystal Noble is a 77 y.o. year old female who sees Lin Glazier Moccasin, MD for primary care. I spoke with  [name] by phone today.  What matters to the patients health and wellness today?  Symptom Management    Goals Addressed             This Visit's Progress    COMPLETED: LCSW Plan of Care-Obtain Supportive Resources   On track    Activities and task to complete in order to accomplish goals.   Keep all upcoming appointments discussed today Continue with compliance of taking medication prescribed by Doctor Implement healthy coping skills discussed to assist with management of symptoms         SDOH assessments and interventions completed:  No     Care Coordination Interventions:  Yes, provided  Interventions Today    Flowsheet Row Most Recent Value  Chronic Disease   Chronic disease during today's visit Hypertension (HTN), Chronic Obstructive Pulmonary Disease (COPD), Other  [Anxiety]  General Interventions   General Interventions Discussed/Reviewed General Interventions Reviewed, Doctor Visits  Doctor Visits Discussed/Reviewed Doctor Visits Reviewed  Mental Health Interventions   Mental Health Discussed/Reviewed Mental Health Reviewed, Coping Strategies, Anxiety  Nutrition Interventions   Nutrition Discussed/Reviewed Nutrition Reviewed  Pharmacy Interventions   Pharmacy Dicussed/Reviewed Pharmacy Topics Reviewed, Medication Adherence       Follow up plan: No further intervention required.   Encounter Outcome:  Pt. Visit Completed   Jenel Lucks, MSW, LCSW [facility] Care Management [facility]  [facility] [email or web address] Phone [phone] 11:28 AM

## 2023-02-10 NOTE — Patient Instructions (Signed)
Visit Information  Thank you for taking time to visit with me today. Please don't hesitate to contact me if I can be of assistance to you.   Following are the goals we discussed today:   Goals Addressed             This Visit's Progress    COMPLETED: LCSW Plan of Care-Obtain Supportive Resources   On track    Activities and task to complete in order to accomplish goals.   Keep all upcoming appointments discussed today Continue with compliance of taking medication prescribed by Doctor Implement healthy coping skills discussed to assist with management of symptoms         Please call the care guide team at [phone] if you need to cancel or reschedule your appointment.   If you are experiencing a Mental Health or Behavioral Health Crisis or need someone to talk to, please call the Suicide and Crisis Lifeline: 988 call 911   The patient verbalized understanding of instructions, educational materials, and care plan provided today and DECLINED offer to receive copy of patient instructions, educational materials, and care plan.   No further follow up required: Patient has no social work or mental health needs at this time. Will continue to work with [facility] RNCM  Jenel Lucks, MSW, LCSW [facility] Care Management [facility]  [address] [email or web address] Phone [phone] 11:32 AM

## 2023-02-16 NOTE — Progress Notes (Signed)
This encounter was created in error - please disregard.

## 2023-02-20 ENCOUNTER — Encounter: Payer: Self-pay | Admitting: Physician Assistant

## 2023-02-20 ENCOUNTER — Ambulatory Visit (INDEPENDENT_AMBULATORY_CARE_PROVIDER_SITE_OTHER): Payer: Medicare HMO | Admitting: Physician Assistant

## 2023-02-20 VITALS — BP 130/80 | HR 96 | Ht 60.0 in | Wt 71.0 lb

## 2023-02-20 DIAGNOSIS — R630 Anorexia: Secondary | ICD-10-CM | POA: Diagnosis not present

## 2023-02-20 DIAGNOSIS — R634 Abnormal weight loss: Secondary | ICD-10-CM | POA: Diagnosis not present

## 2023-02-20 NOTE — Patient Instructions (Addendum)
_______________________________________________________  If your blood pressure at your visit was 140/90 or greater, please contact your primary care physician to follow up on this. _______________________________________________________  If you are age 77 or older, your body mass index should be between 23-30. Your Body mass index is 13.87 kg/m. If this is out of the aforementioned range listed, please consider follow up with your Primary Care Provider. ________________________________________________________  The [facility] providers would like to encourage you to use [facility] to communicate with providers for non-urgent requests or questions.  Due to long hold times on the telephone, sending your provider a message by [facility] may be a faster and more efficient way to get a response.  Please allow 48 business hours for a response.  Please remember that this is for non-urgent requests.  _______________________________________________________  Please add protein bars twice daily (Adkin bars)  Eat 4 to 5 small meals daily  Please follow up with Dr Duanne Guess.  Thank you for entrusting me with your care and choosing [facility].  Amy Esterwood, PA-C

## 2023-02-20 NOTE — Progress Notes (Signed)
Subjective:    Patient ID: Crystal Noble, female    DOB: 1945-09-20, 77 y.o.   MRN: [id number]  HPI Crystal Noble is a 77 year old white female, new to GI today referred by Crystal Rowan, MD for evaluation of weight loss. By review of records patient had previously been followed by Dr. Kinnie Noble but had not been seen there in several years.  She does have prior diagnosis of collagenous colitis but says she has not required any medication for that for many years.  She has sustained weight loss of about 10 pounds this year by her report.  She says she has never weighed overall 100 pounds and has always been a very small person.  She does not think that she had been consistently losing weight for longer than the past year.  She denies any new medications or medication dosages over this past year.  She believes that she has lost weight because she has been eating less and says she does not have much appetite and does try to make herself eat.  She denies any nausea or vomiting, denies any abdominal pain or postprandial abdominal pain.  No heartburn or indigestion no dysphagia or odynophagia.  She says her bowel movements are normal denies any diarrhea, no melena or hematochezia. She says she is unable to tolerate any liquid protein shakes or supplements because she cannot stand the taste of them. She had been given a prescription for Megace but says she did not take it because she did not want to. She is somewhat vague about alcohol intake, her chart relates 3 to 4 glasses of wine per day she says she is drinking 1 glass of wine per day. She feels that she has gained about 2/10 of a pound over the past couple of months.  She did have workup with CT of the chest abdomen and pelvis in June 2024 which shows stable COPD and stable aortic atherosclerosis, no other abnormalities. Most recent labs June 2024 with WBC 7.6/hemoglobin 10.7/hematocrit 33.1/MCV of 98 BUN 49/creatinine 1.51 LFTs within normal  limits Albumin 3.9 Ferritin 67 TSH was low 01/19/2023 at 0.13/T T4 6/T3U 99.1/thyroperoxidase antibody 58.1 elevated  I asked patient if she would like to be referred to a nutritionist and she declined.  She does not want to take any other pills to help her gain weight.  She says she is not really concerned about her weight loss but her doctor was.  Other medical problems include coronary artery disease, hypertension, peripheral arterial disease, COPD and carotid stenosis, chronic EtOH use.  Review of Systems Pertinent positive and negative review of systems were noted in the above HPI section.  All other review of systems was otherwise negative.   Outpatient Encounter Medications as of 02/20/2023  Medication Sig   amLODipine (NORVASC) 5 MG tablet Take 1 tablet (5 mg total) by mouth daily.   ARIPiprazole (ABILIFY) 20 MG tablet Take 20 mg by mouth daily.   busPIRone (BUSPAR) 10 MG tablet Take 20 mg by mouth daily.   Cholecalciferol (VITAMIN D3 ULTRA STRENGTH PO) Take 1 tablet by mouth daily.   escitalopram (LEXAPRO) 20 MG tablet Take 20 mg by mouth daily.   rosuvastatin (CRESTOR) 10 MG tablet Take 10 mg by mouth at bedtime.   TRELEGY ELLIPTA 200-62.5-25 MCG/ACT AEPB Take 1 puff by mouth daily.   valsartan-hydrochlorothiazide (DIOVAN-HCT) 320-25 MG tablet Take 1 tablet by mouth every morning.   No facility-administered encounter medications on file as of 02/20/2023.   Allergies  Allergen Reactions   Other Other (See Comments)    "Exacerbates my collagenous colitis"   Penicillin G Rash   Patient Active Problem List   Diagnosis Date Noted   Nonrheumatic mitral valve regurgitation 02/06/2023   Protein-calorie malnutrition, severe 12/01/2020   Mild cognitive impairment 11/30/2020   Right acetabular fracture (HCC) 11/29/2020   Hypokalemia 06/26/2020   Hypomagnesemia 06/26/2020   Alcohol abuse 06/26/2020   Anxiety disorder 06/26/2020   COPD (chronic obstructive pulmonary disease) (HCC)  06/26/2020   Falls 06/26/2020   Multiple falls    AKI (acute kidney injury) (HCC) 06/25/2020   PAD (peripheral artery disease) (HCC) 08/14/2019   Mixed hyperlipidemia 08/14/2019   Dyspnea on exertion 05/13/2019   Coronary artery disease involving native coronary artery of native heart without angina pectoris 03/27/2019   Aortic atherosclerosis (HCC) 03/27/2019   Tobacco abuse 03/27/2019   Essential hypertension 03/27/2019   Asymptomatic carotid artery stenosis without infarction 03/27/2014   Social History   Socioeconomic History   Marital status: Widowed    Spouse name: Not on file   Number of children: 0   Years of education: Not on file   Highest education level: Not on file  Occupational History   Occupation: retired  Tobacco Use   Smoking status: Every Day    Current packs/day: 0.50    Average packs/day: 0.5 packs/day for 30.0 years (15.0 ttl pk-yrs)    Types: Cigarettes   Smokeless tobacco: Never  Vaping Use   Vaping status: Never Used  Substance and Sexual Activity   Alcohol use: Yes    Alcohol/week: 21.0 standard drinks of alcohol    Types: 21 Glasses of wine per week    Comment: daily   Drug use: No   Sexual activity: Not on file  Other Topics Concern   Not on file  Social History Narrative   Lives alone   Right Handed   Drinks 2-4 cups caffeine dialy   Social Determinants of Health   Financial Resource Strain: Not on file  Food Insecurity: No Food Insecurity (10/17/2022)   Hunger Vital Sign    Worried About Running Out of Food in the Last Year: Never true    Ran Out of Food in the Last Year: Never true  Transportation Needs: No Transportation Needs (10/17/2022)   PRAPARE - Administrator, Civil Service (Medical): No    Lack of Transportation (Non-Medical): No  Physical Activity: Not on file  Stress: Not on file  Social Connections: Not on file  Intimate Partner Violence: Not At Risk (10/17/2022)   Humiliation, Afraid, Rape, and Kick  questionnaire    Fear of Current or Ex-Partner: No    Emotionally Abused: No    Physically Abused: No    Sexually Abused: No    Ms. Wild's family history includes Cancer in her father; Depression in her mother; Heart attack in her mother; Hyperlipidemia in her mother; Hypertension in her mother.      Objective:    Vitals:   02/20/23 1027  BP: 130/80  Pulse: 96    Physical Exam Well-developed very thin frail appearing elderly white female in no acute distress.  Height, Weight, 71 pounds BMI 13.8  HEENT; nontraumatic normocephalic, EOMI, PE R LA, sclera anicteric. Oropharynx; not examined today Neck; supple, no JVD Cardiovascular; regular rate and rhythm with S1-S2, no murmur rub or gallop Pulmonary; Clear bilaterally though somewhat decreased breath sounds bilaterally, mild increased work of breathing getting from chair to table Abdomen; soft,  nontender, nondistended, no palpable mass or hepatosplenomegaly, bowel sounds are active Rectal; not done today Skin; benign exam, no jaundice rash or appreciable lesions Extremities; no clubbing cyanosis or edema skin warm and dry Neuro/Psych; alert and oriented x4, grossly nonfocal mood affect is flat and very short abrupt answers       Assessment & Plan:   #58 77 year old white female referred for weight loss.  Per patient has lost about 10 pounds this year.  She has never weighed over 100 pounds and currently weighs 71 pounds.  Recent labs and CT imaging reviewed.  CT of the chest abdomen and pelvis June 2024 stable COPD and stable aortic atherosclerotic changes, otherwise unremarkable.  Patient has no GI symptoms other than lack of appetite, no symptoms to suggest mesenteric insufficiency. She does complain of chronic shortness of breath and wonder if progressive COPD may be playing a role.  She has also had recent low TSH and elevated thyroperoxidase antibodies Need to consider Graves' disease  As she does not have any  specific GI symptoms other than lack of appetite I do not think endoscopic evaluation would be high yield.  Also her lack of interest in trying to improve her nutritional status with protein supplements and/or a trial of Megace raises question of underlying psychiatric issues contributing to current situation.  #2 regular daily EtOH use #3 hypertension #4.  Peripheral arterial disease #5.  COPD/smoker #6.  Coronary artery disease  Plan; patient was encouraged to try to eat 4-5 small meals per day/add snacks in the form of protein bars.  She is unwilling to try any liquid protein supplements. Declines Megace Declines referral to nutrition Will refer back to primary care, and query if she needs treatment for hyperthyroidism.  Zarian Colpitts Oswald Hillock PA-C 02/20/2023   Cc: Lewis Moccasin, MD

## 2023-02-21 DIAGNOSIS — F1020 Alcohol dependence, uncomplicated: Secondary | ICD-10-CM | POA: Diagnosis not present

## 2023-02-21 DIAGNOSIS — E46 Unspecified protein-calorie malnutrition: Secondary | ICD-10-CM | POA: Diagnosis not present

## 2023-02-21 DIAGNOSIS — R0609 Other forms of dyspnea: Secondary | ICD-10-CM | POA: Diagnosis not present

## 2023-02-21 DIAGNOSIS — I10 Essential (primary) hypertension: Secondary | ICD-10-CM | POA: Diagnosis not present

## 2023-02-21 DIAGNOSIS — F411 Generalized anxiety disorder: Secondary | ICD-10-CM | POA: Diagnosis not present

## 2023-02-21 NOTE — Progress Notes (Signed)
I agree with the assessment and plan as outlined by Ms. Esterwood. Will plan for a GI follow up in 3 months to follow up after evaluation for Graves disease.

## 2023-03-08 ENCOUNTER — Ambulatory Visit: Payer: Self-pay

## 2023-03-09 DIAGNOSIS — I10 Essential (primary) hypertension: Secondary | ICD-10-CM | POA: Diagnosis not present

## 2023-03-09 DIAGNOSIS — J449 Chronic obstructive pulmonary disease, unspecified: Secondary | ICD-10-CM | POA: Diagnosis not present

## 2023-03-09 DIAGNOSIS — F331 Major depressive disorder, recurrent, moderate: Secondary | ICD-10-CM | POA: Diagnosis not present

## 2023-03-09 DIAGNOSIS — E46 Unspecified protein-calorie malnutrition: Secondary | ICD-10-CM | POA: Diagnosis not present

## 2023-03-09 DIAGNOSIS — J441 Chronic obstructive pulmonary disease with (acute) exacerbation: Secondary | ICD-10-CM | POA: Diagnosis not present

## 2023-03-09 DIAGNOSIS — F411 Generalized anxiety disorder: Secondary | ICD-10-CM | POA: Diagnosis not present

## 2023-03-09 DIAGNOSIS — G4700 Insomnia, unspecified: Secondary | ICD-10-CM | POA: Diagnosis not present

## 2023-03-09 NOTE — Patient Outreach (Signed)
Care Coordination   Follow Up Visit Note   03/09/2023 Name: Crystal Noble MRN: [id number] DOB: 03/13/1946  Crystal Noble is a 77 y.o. year old female who sees Crystal Moccasin, MD for primary care. I spoke with  Crystal Noble by phone today.  What matters to the patients health and wellness today?  Patient would like to complete her thyroid biopsy without any complications.     Goals Addressed             This Visit's Progress    Increase daily protein intake   On track    Care Coordination Interventions: Evaluation of current treatment plan related to unintentional weight loss  and patient's adherence to plan as established by provider Determined patient completed a follow up visit with [facility] for evaluation of abnormal weight loss Review of patient status, including review of consultant's reports, relevant laboratory and other test results Plan; patient was encouraged to try to eat 4-5 small meals per day/add snacks in the form of protein bars.  She is unwilling to try any liquid protein supplements. Declines Megace Declines referral to nutrition Will refer back to primary care, and query if she needs treatment for hyperthyroidism Determined patient verbalizes understanding of her prescribed plan      To get established with a Pulmonologist for evaluation and treatment of COPD   On track    Care Coordination Interventions: Evaluation of current treatment plan related to COPD and patient's adherence to plan as established by provider Reviewed with patient her recent follow up with Cardiology for evaluation of ongoing dyspnea Review of patient status, including review of consultant's reports, relevant laboratory and other test results, and medications completed Exertional dyspnea: Multifactorial, strongly suspect COPD. She does have at least moderate MR. However, that would not explain her cachexia. Strongly recommend workup up for COPD. If COPD excluded and  dyspnea remains, could consider further workup for MR. F/u in 6 months Discussed with patient the past Pulmonologist referral placed in June 2024 was denied due to not having enough information, determined patient declines wanting to ask her Cardiologist for the referral, she will discuss this with her PCP at her next scheduled follow up Reviewed and discussed with patient her next scheduled follow up with Dr. Rosemary Noble     To have thyroid biospy completed   On track    Care Coordination Interventions: Evaluation of current treatment plan related to hyperthyroidism and patient's adherence to plan as established by provider Determined patient completed her thyroid US as directed Noted patient is scheduled for a thyroid biopsy on 03/20/23 @3 :15 PM     Interventions Today    Flowsheet Row Most Recent Value  Chronic Disease   Chronic disease during today's visit Chronic Obstructive Pulmonary Disease (COPD), Other  [anorexia,  abnormal thryoid]  General Interventions   General Interventions Discussed/Reviewed General Interventions Discussed, General Interventions Reviewed, Doctor Visits, Labs  Doctor Visits Discussed/Reviewed Doctor Visits Discussed, Doctor Visits Reviewed, PCP, Specialist  Education Interventions   Education Provided Provided Education  Provided Verbal Education On Nutrition, Labs, When to see the doctor, Medication  Nutrition Interventions   Nutrition Discussed/Reviewed Nutrition Discussed, Nutrition Reviewed, Increasing proteins  Pharmacy Interventions   Pharmacy Dicussed/Reviewed Pharmacy Topics Discussed, Pharmacy Topics Reviewed, Medications and their functions          SDOH assessments and interventions completed:  No     Care Coordination Interventions:  Yes, provided   Follow up plan: Follow up call scheduled  for 04/03/23 @1 :00 PM    Encounter Outcome:  Patient Visit Completed

## 2023-03-09 NOTE — Patient Instructions (Signed)
Visit Information  Thank you for taking time to visit with me today. Please don't hesitate to contact me if I can be of assistance to you.   Following are the goals we discussed today:   Goals Addressed             This Visit's Progress    Increase daily protein intake   On track    Care Coordination Interventions: Evaluation of current treatment plan related to unintentional weight loss  and patient's adherence to plan as established by provider Determined patient completed a follow up visit with [facility] for evaluation of abnormal weight loss Review of patient status, including review of consultant's reports, relevant laboratory and other test results Plan; patient was encouraged to try to eat 4-5 small meals per day/add snacks in the form of protein bars.  She is unwilling to try any liquid protein supplements. Declines Megace Declines referral to nutrition Will refer back to primary care, and query if she needs treatment for hyperthyroidism Determined patient verbalizes understanding of her prescribed plan      To get established with a Pulmonologist for evaluation and treatment of COPD   On track    Care Coordination Interventions: Evaluation of current treatment plan related to COPD and patient's adherence to plan as established by provider Reviewed with patient her recent follow up with Cardiology for evaluation of ongoing dyspnea Review of patient status, including review of consultant's reports, relevant laboratory and other test results, and medications completed Exertional dyspnea: Multifactorial, strongly suspect COPD. She does have at least moderate MR. However, that would not explain her cachexia. Strongly recommend workup up for COPD. If COPD excluded and dyspnea remains, could consider further workup for MR. F/u in 6 months Discussed with patient the past Pulmonologist referral placed in June 2024 was denied due to not having enough information, determined patient  declines wanting to ask her Cardiologist for the referral, she will discuss this with her PCP at her next scheduled follow up Reviewed and discussed with patient her next scheduled follow up with Dr. Rosemary Holms     To have thyroid biospy completed   On track    Care Coordination Interventions: Evaluation of current treatment plan related to hyperthyroidism and patient's adherence to plan as established by provider Determined patient completed her thyroid US as directed Noted patient is scheduled for a thyroid biopsy on 03/20/23 @3 :15 PM         Our next appointment is by telephone on 04/03/23 at 1:00 PM  Please call the care guide team at [phone] if you need to cancel or reschedule your appointment.   If you are experiencing a Mental Health or Behavioral Health Crisis or need someone to talk to, please call 1-800-273-TALK (toll free, 24 hour hotline)  The patient verbalized understanding of instructions, educational materials, and care plan provided today and DECLINED offer to receive copy of patient instructions, educational materials, and care plan.   Delsa Sale RN BSN CCM [place]  [facility], [facility] Nurse Care Coordinator  Direct Dial: [phone] Website: [email or web address]

## 2023-03-16 DIAGNOSIS — J441 Chronic obstructive pulmonary disease with (acute) exacerbation: Secondary | ICD-10-CM | POA: Diagnosis not present

## 2023-03-16 DIAGNOSIS — I10 Essential (primary) hypertension: Secondary | ICD-10-CM | POA: Diagnosis not present

## 2023-03-16 DIAGNOSIS — R636 Underweight: Secondary | ICD-10-CM | POA: Diagnosis not present

## 2023-03-20 ENCOUNTER — Ambulatory Visit
Admission: RE | Admit: 2023-03-20 | Discharge: 2023-03-20 | Disposition: A | Discharge status: Home / Self Care | Payer: Medicare HMO | Source: Ambulatory Visit | Attending: Family Medicine | Admitting: Family Medicine

## 2023-03-20 ENCOUNTER — Other Ambulatory Visit (HOSPITAL_COMMUNITY)
Admission: RE | Admit: 2023-03-20 | Discharge: 2023-03-20 | Disposition: A | Discharge status: Home / Self Care | Payer: Medicare HMO | Source: Ambulatory Visit | Attending: Family Medicine | Admitting: Family Medicine

## 2023-03-20 DIAGNOSIS — E041 Nontoxic single thyroid nodule: Secondary | ICD-10-CM | POA: Diagnosis not present

## 2023-03-22 LAB — CYTOLOGY - NON PAP

## 2023-03-31 DIAGNOSIS — J441 Chronic obstructive pulmonary disease with (acute) exacerbation: Secondary | ICD-10-CM | POA: Diagnosis not present

## 2023-03-31 DIAGNOSIS — F17200 Nicotine dependence, unspecified, uncomplicated: Secondary | ICD-10-CM | POA: Diagnosis not present

## 2023-03-31 DIAGNOSIS — E46 Unspecified protein-calorie malnutrition: Secondary | ICD-10-CM | POA: Diagnosis not present

## 2023-03-31 DIAGNOSIS — J449 Chronic obstructive pulmonary disease, unspecified: Secondary | ICD-10-CM | POA: Diagnosis not present

## 2023-03-31 DIAGNOSIS — R0602 Shortness of breath: Secondary | ICD-10-CM | POA: Diagnosis not present

## 2023-03-31 DIAGNOSIS — I10 Essential (primary) hypertension: Secondary | ICD-10-CM | POA: Diagnosis not present

## 2023-04-03 ENCOUNTER — Ambulatory Visit: Payer: Self-pay

## 2023-04-03 NOTE — Patient Instructions (Signed)
Visit Information  Thank you for taking time to visit with me today. Please don't hesitate to contact me if I can be of assistance to you.   Following are the goals we discussed today:   Goals Addressed             This Visit's Progress    To get established with a Pulmonologist for evaluation and treatment of COPD   On track    Care Coordination Interventions: Evaluation of current treatment plan related to COPD and patient's adherence to plan as established by provider Reviewed and discussed with patient her new patient appointment with Dr. Francine Graven with [facility] scheduled for 05/24/23 @1 :30 PM  Discussed with patient she continues to smoke and declines wanting to stop at this time      COMPLETED: To have thyroid biospy completed       Care Coordination Interventions: Evaluation of current treatment plan related to hyperthyroidism and patient's adherence to plan as established by provider Reviewed and discussed with patient, she completed her thyroid biopsy as directed with benign results reviewed with patient by her provider  Determined patient voices having no other concerns related to her thyroid at this time          Our next appointment is by telephone on 06/05/23 at 11:00 AM  Please call the care guide team at [phone] if you need to cancel or reschedule your appointment.   If you are experiencing a Mental Health or Behavioral Health Crisis or need someone to talk to, please call 1-800-273-TALK (toll free, 24 hour hotline)  The patient verbalized understanding of instructions, educational materials, and care plan provided today and DECLINED offer to receive copy of patient instructions, educational materials, and care plan.   Delsa Sale RN BSN CCM [place]  [facility], [facility] Health Nurse Care Coordinator  Direct Dial: [phone] Website: [email or web address]

## 2023-04-03 NOTE — Patient Outreach (Signed)
Care Coordination   Follow Up Visit Note   04/03/2023 Name: SHEYLI GARGAS MRN: [id number] DOB: May 05, 1946  ZARYA BETANCOURTH is a 77 y.o. year old female who sees Lewis Moccasin, MD for primary care. I spoke with  [name] by phone today.  What matters to the patients health and wellness today?  Patient would like to improve her breathing.     Goals Addressed             This Visit's Progress    To get established with a Pulmonologist for evaluation and treatment of COPD   On track    Care Coordination Interventions: Evaluation of current treatment plan related to COPD and patient's adherence to plan as established by provider Reviewed and discussed with patient her new patient appointment with Dr. Francine Graven with [facility] scheduled for 05/24/23 @1 :30 PM  Discussed with patient she continues to smoke and declines wanting to stop at this time      COMPLETED: To have thyroid biospy completed       Care Coordination Interventions: Evaluation of current treatment plan related to hyperthyroidism and patient's adherence to plan as established by provider Reviewed and discussed with patient, she completed her thyroid biopsy as directed with benign results reviewed with patient by her provider  Determined patient voices having no other concerns related to her thyroid at this time      Interventions Today    Flowsheet Row Most Recent Value  Chronic Disease   Chronic disease during today's visit Chronic Obstructive Pulmonary Disease (COPD), Other  [thyroid nodule,  anorexia]  General Interventions   General Interventions Discussed/Reviewed General Interventions Reviewed, General Interventions Discussed, Doctor Visits, Labs  Doctor Visits Discussed/Reviewed Doctor Visits Discussed, Doctor Visits Reviewed, Specialist  Exercise Interventions   Exercise Discussed/Reviewed Weight Managment  Weight Management Weight maintenance  Education Interventions    Education Provided Provided Education  Nutrition Interventions   Nutrition Discussed/Reviewed Nutrition Discussed, Nutrition Reviewed, Increasing proteins, Supplemental nutrition          SDOH assessments and interventions completed:  No     Care Coordination Interventions:  Yes, provided   Follow up plan: Follow up call scheduled for 06/05/23 @11 :00 AM    Encounter Outcome:  Patient Visit Completed

## 2023-04-04 DIAGNOSIS — J441 Chronic obstructive pulmonary disease with (acute) exacerbation: Secondary | ICD-10-CM | POA: Diagnosis not present

## 2023-04-04 DIAGNOSIS — F17200 Nicotine dependence, unspecified, uncomplicated: Secondary | ICD-10-CM | POA: Diagnosis not present

## 2023-04-04 DIAGNOSIS — I10 Essential (primary) hypertension: Secondary | ICD-10-CM | POA: Diagnosis not present

## 2023-04-04 DIAGNOSIS — E46 Unspecified protein-calorie malnutrition: Secondary | ICD-10-CM | POA: Diagnosis not present

## 2023-04-06 DIAGNOSIS — E46 Unspecified protein-calorie malnutrition: Secondary | ICD-10-CM | POA: Diagnosis not present

## 2023-04-06 DIAGNOSIS — F17200 Nicotine dependence, unspecified, uncomplicated: Secondary | ICD-10-CM | POA: Diagnosis not present

## 2023-04-06 DIAGNOSIS — I10 Essential (primary) hypertension: Secondary | ICD-10-CM | POA: Diagnosis not present

## 2023-04-06 DIAGNOSIS — J441 Chronic obstructive pulmonary disease with (acute) exacerbation: Secondary | ICD-10-CM | POA: Diagnosis not present

## 2023-04-13 DIAGNOSIS — I10 Essential (primary) hypertension: Secondary | ICD-10-CM | POA: Diagnosis not present

## 2023-04-13 DIAGNOSIS — E041 Nontoxic single thyroid nodule: Secondary | ICD-10-CM | POA: Diagnosis not present

## 2023-04-13 DIAGNOSIS — J441 Chronic obstructive pulmonary disease with (acute) exacerbation: Secondary | ICD-10-CM | POA: Diagnosis not present

## 2023-04-13 DIAGNOSIS — R64 Cachexia: Secondary | ICD-10-CM | POA: Diagnosis not present

## 2023-04-13 DIAGNOSIS — R0600 Dyspnea, unspecified: Secondary | ICD-10-CM | POA: Diagnosis not present

## 2023-04-13 DIAGNOSIS — I447 Left bundle-branch block, unspecified: Secondary | ICD-10-CM | POA: Diagnosis not present

## 2023-04-27 DIAGNOSIS — Z1339 Encounter for screening examination for other mental health and behavioral disorders: Secondary | ICD-10-CM | POA: Diagnosis not present

## 2023-04-27 DIAGNOSIS — E785 Hyperlipidemia, unspecified: Secondary | ICD-10-CM | POA: Diagnosis not present

## 2023-04-27 DIAGNOSIS — I10 Essential (primary) hypertension: Secondary | ICD-10-CM | POA: Diagnosis not present

## 2023-04-27 DIAGNOSIS — J449 Chronic obstructive pulmonary disease, unspecified: Secondary | ICD-10-CM | POA: Diagnosis not present

## 2023-04-27 DIAGNOSIS — R739 Hyperglycemia, unspecified: Secondary | ICD-10-CM | POA: Diagnosis not present

## 2023-04-27 DIAGNOSIS — Z0000 Encounter for general adult medical examination without abnormal findings: Secondary | ICD-10-CM | POA: Diagnosis not present

## 2023-04-27 DIAGNOSIS — R5383 Other fatigue: Secondary | ICD-10-CM | POA: Diagnosis not present

## 2023-04-27 DIAGNOSIS — Z23 Encounter for immunization: Secondary | ICD-10-CM | POA: Diagnosis not present

## 2023-04-27 DIAGNOSIS — Z1331 Encounter for screening for depression: Secondary | ICD-10-CM | POA: Diagnosis not present

## 2023-05-01 DIAGNOSIS — R739 Hyperglycemia, unspecified: Secondary | ICD-10-CM | POA: Diagnosis not present

## 2023-05-01 DIAGNOSIS — E039 Hypothyroidism, unspecified: Secondary | ICD-10-CM | POA: Diagnosis not present

## 2023-05-01 DIAGNOSIS — R7989 Other specified abnormal findings of blood chemistry: Secondary | ICD-10-CM | POA: Diagnosis not present

## 2023-05-01 DIAGNOSIS — Z789 Other specified health status: Secondary | ICD-10-CM | POA: Diagnosis not present

## 2023-05-01 DIAGNOSIS — I10 Essential (primary) hypertension: Secondary | ICD-10-CM | POA: Diagnosis not present

## 2023-05-01 DIAGNOSIS — E782 Mixed hyperlipidemia: Secondary | ICD-10-CM | POA: Diagnosis not present

## 2023-05-24 ENCOUNTER — Encounter: Payer: Self-pay | Admitting: Pulmonary Disease

## 2023-05-24 ENCOUNTER — Ambulatory Visit: Payer: Medicare HMO | Admitting: Pulmonary Disease

## 2023-05-24 VITALS — BP 123/76 | HR 76 | Temp 97.9°F | Ht 60.0 in | Wt 71.0 lb

## 2023-05-24 DIAGNOSIS — J432 Centrilobular emphysema: Secondary | ICD-10-CM | POA: Diagnosis not present

## 2023-05-24 MED ORDER — BUDESONIDE 0.5 MG/2ML IN SUSP
0.5000 mg | Freq: Two times a day (BID) | RESPIRATORY_TRACT | 12 refills | Status: DC
Start: 2023-05-24 — End: 2023-09-19

## 2023-05-24 MED ORDER — IPRATROPIUM-ALBUTEROL 0.5-2.5 (3) MG/3ML IN SOLN
3.0000 mL | Freq: Four times a day (QID) | RESPIRATORY_TRACT | 11 refills | Status: DC | PRN
Start: 2023-05-24 — End: 2023-09-19

## 2023-05-24 NOTE — Patient Instructions (Signed)
Your CT Chest scan shows emphysema/COPD  Start All Nebulizer Regimen - budesonide neb twice daily - Duoneb every 6-8 hours  Use albuterol inhaler or neb as needed every 4-6 hours  Stop trelegy when you start your new nebulizer regimen  Recommend smoking cessation with nicotine replacement therapy - Use nicotine patch daily, either 7 or 14mg  doses - Use mini nicotine lozenges 2mg  as needed for breakthrough cravings on the patch  Follow up in 3 months with pulmonary function tests

## 2023-05-24 NOTE — Progress Notes (Signed)
Synopsis: Referred in December 2024 for COPD by Maryelizabeth Rowan, MD  Subjective:   PATIENT ID: Crystal Noble GENDER: female DOB: 07/18/1945, MRN: [id number]  HPI  Chief Complaint  Patient presents with   Pulmonary Consult    Referred by Dr. Maryelizabeth Rowan for eval of COPD. Pt c/o SOB with or without exertion over the past 2 months. She is winded and has to rest sometimes walking around her home. She states that she has "wheezing all the time".    Crystal Noble is a 77 year old woman, daily smoker with history of hypertension and stroke who is referred to pulmonary clinic for evaluation of COPD.   The patient, a long-term smoker, presents with a couple months of progressive shortness of breath and wheezing. They deny any prior respiratory issues or symptoms in their younger years. They report no associated cough. They have reduced their smoking from a pack a day to half a pack a day over the years. They deny any nocturnal symptoms or sleep disturbances due to their respiratory symptoms. They have been using a nebulizer and a Trelegy inhaler, but report no improvement in their symptoms. They have not used the nebulizer for the past two weeks. They deny any leg swelling. They express a potential interest in quitting smoking.  Past Medical History:  Diagnosis Date   Carotid stenosis, asymptomatic    Colitis, collagenous    Hypertension    Stroke [facility]) 06/2019     Family History  Problem Relation Age of Onset   Hypertension Mother    Hyperlipidemia Mother    Heart attack Mother    Depression Mother        Bi-Polar   Cancer Father        Lung   Lung cancer Father        asbestos/never smoked   Liver disease Neg Hx    Colon cancer Neg Hx    Esophageal cancer Neg Hx      Social History   Socioeconomic History   Marital status: Widowed    Spouse name: Not on file   Number of children: 0   Years of education: Not on file   Highest education level: Not on file   Occupational History   Occupation: retired  Tobacco Use   Smoking status: Every Day    Current packs/day: 0.50    Average packs/day: 0.5 packs/day for 31.9 years (16.0 ttl pk-yrs)    Types: Cigarettes    Start date: 06/14/1991   Smokeless tobacco: Never  Vaping Use   Vaping status: Never Used  Substance and Sexual Activity   Alcohol use: Yes    Alcohol/week: 21.0 standard drinks of alcohol    Types: 21 Glasses of wine per week    Comment: daily   Drug use: No   Sexual activity: Not on file  Other Topics Concern   Not on file  Social History Narrative   Lives alone   Right Handed   Drinks 2-4 cups caffeine dialy   Social Determinants of Health   Financial Resource Strain: Not on file  Food Insecurity: No Food Insecurity (10/17/2022)   Hunger Vital Sign    Worried About Running Out of Food in the Last Year: Never true    Ran Out of Food in the Last Year: Never true  Transportation Needs: No Transportation Needs (10/17/2022)   PRAPARE - Administrator, Civil Service (Medical): No    Lack of Transportation (Non-Medical): No  Physical Activity: Not on file  Stress: Not on file  Social Connections: Not on file  Intimate Partner Violence: Not At Risk (10/17/2022)   Humiliation, Afraid, Rape, and Kick questionnaire    Fear of Current or Ex-Partner: No    Emotionally Abused: No    Physically Abused: No    Sexually Abused: No     Allergies  Allergen Reactions   Other Other (See Comments)    "Exacerbates my collagenous colitis"   Penicillin G Rash     Outpatient Medications Prior to Visit  Medication Sig Dispense Refill   albuterol (VENTOLIN HFA) 108 (90 Base) MCG/ACT inhaler Inhale 2 puffs into the lungs every 4 (four) hours as needed.     amLODipine (NORVASC) 5 MG tablet Take 1 tablet (5 mg total) by mouth daily. 30 tablet 0   ARIPiprazole (ABILIFY) 20 MG tablet Take 20 mg by mouth daily.     busPIRone (BUSPAR) 10 MG tablet Take 20 mg by mouth daily.      carvedilol (COREG) 3.125 MG tablet Take 3.125 mg by mouth 2 (two) times daily.     Cholecalciferol (VITAMIN D3 ULTRA STRENGTH PO) Take 1 tablet by mouth daily.     escitalopram (LEXAPRO) 20 MG tablet Take 20 mg by mouth daily.     rosuvastatin (CRESTOR) 10 MG tablet Take 10 mg by mouth at bedtime.     valsartan (DIOVAN) 320 MG tablet Take 160 mg by mouth daily.     albuterol (PROVENTIL) (2.5 MG/3ML) 0.083% nebulizer solution Take 2.5 mg by nebulization every 4 (four) hours as needed.     TRELEGY ELLIPTA 200-62.5-25 MCG/ACT AEPB Take 1 puff by mouth daily.     valsartan-hydrochlorothiazide (DIOVAN-HCT) 320-25 MG tablet Take 1 tablet by mouth every morning.     No facility-administered medications prior to visit.    Review of Systems  Constitutional:  Negative for chills, fever, malaise/fatigue and weight loss.  HENT:  Negative for congestion, sinus pain and sore throat.   Eyes: Negative.   Respiratory:  Positive for shortness of breath and wheezing. Negative for cough, hemoptysis and sputum production.   Cardiovascular:  Negative for chest pain, palpitations, orthopnea, claudication and leg swelling.  Gastrointestinal:  Negative for abdominal pain, heartburn, nausea and vomiting.  Genitourinary: Negative.   Musculoskeletal:  Negative for joint pain and myalgias.  Skin:  Negative for rash.  Neurological:  Negative for weakness.  Endo/Heme/Allergies: Negative.   Psychiatric/Behavioral: Negative.     Objective:   Vitals:   05/24/23 1331  BP: 123/76  Pulse: 76  Temp: 97.9 F (36.6 C)  TempSrc: Temporal  SpO2: 99%  Weight: 71 lb (32.2 kg)  Height: 5' (1.524 m)   Physical Exam Constitutional:      General: She is not in acute distress.    Appearance: Normal appearance.  Eyes:     General: No scleral icterus.    Conjunctiva/sclera: Conjunctivae normal.  Cardiovascular:     Rate and Rhythm: Normal rate and regular rhythm.  Pulmonary:     Breath sounds: No wheezing, rhonchi or  rales.  Musculoskeletal:     Right lower leg: No edema.     Left lower leg: No edema.  Skin:    General: Skin is warm and dry.  Neurological:     General: No focal deficit present.     CBC    Component Value Date/Time   WBC 7.6 12/06/2022 1429   RBC 3.35 (L) 12/06/2022 1429   HGB 10.7 (L)  12/06/2022 1429   HCT 33.1 (L) 12/06/2022 1429   PLT 259 12/06/2022 1429   MCV 98.8 12/06/2022 1429   MCH 31.9 12/06/2022 1429   MCHC 32.3 12/06/2022 1429   RDW 13.4 12/06/2022 1429   LYMPHSABS 1.1 12/06/2022 1429   MONOABS 0.5 12/06/2022 1429   EOSABS 0.1 12/06/2022 1429   BASOSABS 0.0 12/06/2022 1429      Latest Ref Rng & Units 12/06/2022    2:29 PM 10/20/2022    8:29 AM 10/19/2022    5:28 AM  BMP  Glucose 70 - 99 mg/dL 782  98  956   BUN 8 - 23 mg/dL 47  51  52   Creatinine 0.44 - 1.00 mg/dL 2.13  0.86  5.78   Sodium 135 - 145 mmol/L 139  135  135   Potassium 3.5 - 5.1 mmol/L 4.1  3.9  4.4   Chloride 98 - 111 mmol/L 108  109  109   CO2 22 - 32 mmol/L 21  20  19    Calcium 8.9 - 10.3 mg/dL 9.3  8.7  8.5    Chest imaging: CTA Chest 12/06/22 Mediastinum/Nodes: No enlarged mediastinal, hilar, or axillary lymph nodes. Thyroid gland, trachea, and esophagus demonstrate no significant findings.   Lungs/Pleura: Stable emphysema. No acute airspace disease, effusion, or pneumothorax. Central airways are patent.  PFT:     No data to display          Labs:  Path:  Echo:  Heart Catheterization:    Assessment & Plan:   Centrilobular emphysema (HCC) - Plan: budesonide (PULMICORT) 0.5 MG/2ML nebulizer solution, ipratropium-albuterol (DUONEB) 0.5-2.5 (3) MG/3ML SOLN, Pulmonary Function Test  Discussion: Crystal Noble is a 77 year old woman, daily smoker with history of hypertension and stroke who is referred to pulmonary clinic for evaluation of COPD.   Chronic Obstructive Pulmonary Disease (COPD) Persistent wheezing and shortness of breath despite current use of Trelegy  inhaler. CT scan shows emphysema. Patient is a current smoker. -Discontinue Trelegy inhaler once nebulizer medications are obtained. -Start Budesonide nebulizer twice daily. -Start DuoNeb nebulizer three to four times daily. -Schedule follow-up in three months with pulmonary function tests.  Tobacco Use Disorder Current smoker with a long history of smoking, currently half a pack per day. Expressed some interest in quitting. -Recommend nicotine replacement therapy with 7 or 14 mg patches daily and 2 mg mini nicotine lozenges as needed. -Encourage patient to attempt to quit smoking, especially in the first two weeks of nicotine patch use.  Melody Comas, MD [facility] Office: [phone]    Current Outpatient Medications:    albuterol (VENTOLIN HFA) 108 (90 Base) MCG/ACT inhaler, Inhale 2 puffs into the lungs every 4 (four) hours as needed., Disp: , Rfl:    amLODipine (NORVASC) 5 MG tablet, Take 1 tablet (5 mg total) by mouth daily., Disp: 30 tablet, Rfl: 0   ARIPiprazole (ABILIFY) 20 MG tablet, Take 20 mg by mouth daily., Disp: , Rfl:    budesonide (PULMICORT) 0.5 MG/2ML nebulizer solution, Take 2 mLs (0.5 mg total) by nebulization 2 (two) times daily., Disp: 120 mL, Rfl: 12   busPIRone (BUSPAR) 10 MG tablet, Take 20 mg by mouth daily., Disp: , Rfl:    carvedilol (COREG) 3.125 MG tablet, Take 3.125 mg by mouth 2 (two) times daily., Disp: , Rfl:    Cholecalciferol (VITAMIN D3 ULTRA STRENGTH PO), Take 1 tablet by mouth daily., Disp: , Rfl:    escitalopram (LEXAPRO) 20 MG tablet,  Take 20 mg by mouth daily., Disp: , Rfl:    ipratropium-albuterol (DUONEB) 0.5-2.5 (3) MG/3ML SOLN, Take 3 mLs by nebulization every 6 (six) hours as needed., Disp: 360 mL, Rfl: 11   rosuvastatin (CRESTOR) 10 MG tablet, Take 10 mg by mouth at bedtime., Disp: , Rfl:    valsartan (DIOVAN) 320 MG tablet, Take 160 mg by mouth daily., Disp: , Rfl:

## 2023-05-29 DIAGNOSIS — F17210 Nicotine dependence, cigarettes, uncomplicated: Secondary | ICD-10-CM | POA: Diagnosis not present

## 2023-06-01 DIAGNOSIS — N959 Unspecified menopausal and perimenopausal disorder: Secondary | ICD-10-CM | POA: Diagnosis not present

## 2023-06-01 DIAGNOSIS — Z780 Asymptomatic menopausal state: Secondary | ICD-10-CM | POA: Diagnosis not present

## 2023-06-02 ENCOUNTER — Ambulatory Visit: Payer: Medicare HMO | Admitting: Internal Medicine

## 2023-06-05 ENCOUNTER — Ambulatory Visit: Payer: Self-pay

## 2023-06-05 DIAGNOSIS — R7989 Other specified abnormal findings of blood chemistry: Secondary | ICD-10-CM | POA: Diagnosis not present

## 2023-06-05 DIAGNOSIS — N200 Calculus of kidney: Secondary | ICD-10-CM | POA: Diagnosis not present

## 2023-06-05 NOTE — Patient Outreach (Signed)
  Care Coordination   06/05/2023 Name: NICEY KECK MRN: [id number] DOB: 02/07/1946   Care Coordination Outreach Attempts:  An unsuccessful outreach was attempted for an appointment today.  Follow Up Plan:  Additional outreach attempts will be made to offer the patient complex care management information and services.   Encounter Outcome:  No Answer   Care Coordination Interventions:  No, not indicated    Delsa Sale RN BSN CCM [place]  [facility], [facility] Nurse Care Coordinator  Direct Dial: [phone] Website: [email or web address]

## 2023-06-08 DIAGNOSIS — J449 Chronic obstructive pulmonary disease, unspecified: Secondary | ICD-10-CM | POA: Diagnosis not present

## 2023-06-08 DIAGNOSIS — N200 Calculus of kidney: Secondary | ICD-10-CM | POA: Diagnosis not present

## 2023-06-08 DIAGNOSIS — I2510 Atherosclerotic heart disease of native coronary artery without angina pectoris: Secondary | ICD-10-CM | POA: Diagnosis not present

## 2023-06-08 DIAGNOSIS — I10 Essential (primary) hypertension: Secondary | ICD-10-CM | POA: Diagnosis not present

## 2023-06-09 ENCOUNTER — Encounter: Payer: Self-pay | Admitting: Nurse Practitioner

## 2023-06-09 ENCOUNTER — Ambulatory Visit (INDEPENDENT_AMBULATORY_CARE_PROVIDER_SITE_OTHER): Payer: Medicare HMO | Admitting: Nurse Practitioner

## 2023-06-09 VITALS — BP 118/60 | HR 88 | Ht 60.0 in | Wt 74.2 lb

## 2023-06-09 DIAGNOSIS — J4489 Other specified chronic obstructive pulmonary disease: Secondary | ICD-10-CM

## 2023-06-09 DIAGNOSIS — R946 Abnormal results of thyroid function studies: Secondary | ICD-10-CM | POA: Diagnosis not present

## 2023-06-09 DIAGNOSIS — F17210 Nicotine dependence, cigarettes, uncomplicated: Secondary | ICD-10-CM

## 2023-06-09 DIAGNOSIS — D649 Anemia, unspecified: Secondary | ICD-10-CM

## 2023-06-09 DIAGNOSIS — Z8639 Personal history of other endocrine, nutritional and metabolic disease: Secondary | ICD-10-CM | POA: Diagnosis not present

## 2023-06-09 DIAGNOSIS — R634 Abnormal weight loss: Secondary | ICD-10-CM | POA: Diagnosis not present

## 2023-06-09 NOTE — Patient Instructions (Signed)
_______________________________________________________  If your blood pressure at your visit was 140/90 or greater, please contact your primary care physician to follow up on this.  _______________________________________________________  If you are age 77 or older, your body mass index should be between 23-30. Your Body mass index is 14.5 kg/m. If this is out of the aforementioned range listed, please consider follow up with your Primary Care Provider.  If you are age 59 or younger, your body mass index should be between 19-25. Your Body mass index is 14.5 kg/m. If this is out of the aformentioned range listed, please consider follow up with your Primary Care Provider.   ________________________________________________________  The [facility] providers would like to encourage you to use [facility] to communicate with providers for non-urgent requests or questions.  Due to long hold times on the telephone, sending your provider a message by [facility] may be a faster and more efficient way to get a response.  Please allow 48 business hours for a response.  Please remember that this is for non-urgent requests.  _______________________________________________________  Follow up as needed.  It was a pleasure to see you today!  Thank you for trusting me with your gastrointestinal care!

## 2023-06-09 NOTE — Progress Notes (Signed)
ASSESSMENT    Brief Narrative:  77 y.o.  female known to Dr. Leonides Schanz with a past medical history not limited to collagenous colitis, COPD, hypertension , CVA , tobacco abuse , chronic EtOH use  Weight loss, resolving.  No further losses since seen in Sept but still with low body weight. BMI 14.5. Prior CT chest, abdomen and pelvis unrevealing.  No GI symptoms. She previously declined trial of Megace or consultation with a Nutritionist. Difficult to extract information from patient, she is not forthcoming  Chronic anemia, baseline hgb 10.7. MCV upper limits of normal now but previously macrocytic.   Prior abnormal thyroid studies. Monitored by PCP Patient tells me PCP doesn't think she has hyperthyroidism.   History of thyroid nodule, benign by FNA in Oct 2024.   COPD, ongoing tobacco use.  Didn't try Nicotine patch prescribed by Pulmonary  See [facility] for any additional medical & surgical history   PLAN   Advised to contact us if she develops any GI symptoms such as nausea, vomiting, GERD, dysphagia, abdominal pain, bowel changes, blood in stool, etc  HPI   Chief complaint : 26-month follow-up on weight loss  Brief GI History:  Trane was seen here 02/20/2023 by Mike Gip, PA for evaluation of weight loss.  A recent CT scan of the chest , abdomen and pelvis was non diagnostic. She had decided not to take Megace prescribed her PCP.  She was not interested in seeing a nutritionist.  It was not felt that endoscopic evaluation would be helpful in determining cause of her weight loss since she had no GI symptoms.  She was encouraged to eat 4-5 small meals a day and add snacks in the form of protein bars.  She did not want to try any liquid protein supplements.  There was a question of underlying hyperthyroidism   Interval History :  Her weight is up 3 pounds since we saw her in September. Her appetite is a little better but still only eating 3 meals a day. Not taking any snacks. She  still doesn't have any GI symptoms. Specifically, no nausea / vomiting / dysphagia / abdominal pain / bowel habit issues  She had abnormal thyroid studies but tells me Dr. Duanne Guess doesn't think she has any thyroid problems- saw her last week. She had FNA of thyroid nodule in October and cytology showed benign follicular nodule (Bethesda category II)   Prayer recently prescribed nicotine patch by Pulmonary. She isn't interested in trying it. Doesn't have desire to stop smoking.   Sunniva has chronic anemia, MCV upper limits of normal.    Past Medical History:  Diagnosis Date   Carotid stenosis, asymptomatic    Colitis, collagenous    Hypertension    Stroke [facility]) 06/2019    Past Surgical History:  Procedure Laterality Date   APPENDECTOMY      Family History  Problem Relation Age of Onset   Hypertension Mother    Hyperlipidemia Mother    Heart attack Mother    Depression Mother        Bi-Polar   Cancer Father        Lung   Lung cancer Father        asbestos/never smoked   Liver disease Neg Hx    Colon cancer Neg Hx    Esophageal cancer Neg Hx     Current Medications, Allergies, Family History and Social History were reviewed in [employer] electronic medical record.     Current  Outpatient Medications  Medication Sig Dispense Refill   albuterol (VENTOLIN HFA) 108 (90 Base) MCG/ACT inhaler Inhale 2 puffs into the lungs every 4 (four) hours as needed.     amLODipine (NORVASC) 5 MG tablet Take 1 tablet (5 mg total) by mouth daily. 30 tablet 0   ARIPiprazole (ABILIFY) 30 MG tablet Take 30 mg by mouth daily.     budesonide (PULMICORT) 0.5 MG/2ML nebulizer solution Take 2 mLs (0.5 mg total) by nebulization 2 (two) times daily. 120 mL 12   busPIRone (BUSPAR) 10 MG tablet Take 20 mg by mouth daily.     carvedilol (COREG) 25 MG tablet Take 25 mg by mouth 2 (two) times daily.     Cholecalciferol (VITAMIN D3 ULTRA STRENGTH PO) Take 1 tablet by mouth daily.     escitalopram  (LEXAPRO) 20 MG tablet Take 20 mg by mouth daily.     ipratropium-albuterol (DUONEB) 0.5-2.5 (3) MG/3ML SOLN Take 3 mLs by nebulization every 6 (six) hours as needed. 360 mL 11   rosuvastatin (CRESTOR) 10 MG tablet Take 10 mg by mouth at bedtime.     valsartan (DIOVAN) 320 MG tablet Take 160 mg by mouth daily.     No current facility-administered medications for this visit.    Review of Systems: No chest pain. No shortness of breath. No urinary complaints.    Physical Exam  Filed Weights   06/09/23 1044  Weight: 74 lb 4 oz (33.7 kg)   Wt Readings from Last 3 Encounters:  06/09/23 74 lb 4 oz (33.7 kg)  05/24/23 71 lb (32.2 kg)  02/20/23 71 lb (32.2 kg)    BP 118/60 (BP Location: Left Arm, Patient Position: Sitting, Cuff Size: Normal)   Pulse 88   Ht 5' (1.524 m)   Wt 74 lb 4 oz (33.7 kg)   BMI 14.50 kg/m  Constitutional:  Thin, overall well appearing female in no acute distress. Psychiatric: Normal mood and affect. Behavior is normal. EENT: Pupils normal.  Conjunctivae are normal. No scleral icterus. Neck supple.  Cardiovascular: Normal rate, regular rhythm.  Pulmonary/chest: Effort normal and breath sounds normal. No wheezing, rales or rhonchi. Abdominal: Soft, nondistended, nontender. Bowel sounds active throughout. There are no masses palpable. No hepatomegaly. Neurological: Alert and oriented to person place and time.    Willette Cluster, NP  06/09/2023, 10:55 AM  Cc:  Lewis Moccasin, MD

## 2023-06-12 NOTE — Progress Notes (Signed)
I agree with the assessment and plan as outlined by Ms. Guenther. Okay to offer the patient an EGD/colonoscopy to rule out GI malignancy as a contributor to her weight loss and anemia if she is interested. I do think it is reassuring that she is no longer losing weight and that her anemia work up did not show clear iron deficiency anemia.

## 2023-06-15 ENCOUNTER — Telehealth: Payer: Self-pay | Admitting: *Deleted

## 2023-06-15 NOTE — Telephone Encounter (Signed)
 Called patient in reference to the EGD/ Colonoscopy per Ms. Willette Cluster, NP having discussed with Dr. Leonides Schanz due to recent weight loss. Patient states she is not interested in having the procedures done.

## 2023-06-15 NOTE — Telephone Encounter (Signed)
-----   Message from Rosario JAYSON Kidney sent at 06/13/2023  3:36 PM EST ----- Tagging Merlynn to this message ----- Message ----- From: Kerman Vina HERO, NP Sent: 06/13/2023   3:30 PM EST To: Rosario JAYSON Kidney, MD  Hi Merlynn,  Please call Juri and let her know I discussed her case with Dr. Kidney. While she has no GI symptoms, Dr. Kidney is happy to offer and EGD / colonoscopy for further evaluation of the recent weight loss. If she is interested and no on oxygen then can do at [facility] with Dr. Kidney.   Thanks

## 2023-06-27 ENCOUNTER — Encounter: Payer: Self-pay | Admitting: Pulmonary Disease

## 2023-07-05 ENCOUNTER — Ambulatory Visit: Payer: Medicare HMO

## 2023-07-05 DIAGNOSIS — J432 Centrilobular emphysema: Secondary | ICD-10-CM | POA: Diagnosis not present

## 2023-07-05 LAB — PULMONARY FUNCTION TEST
DL/VA % pred: 63 %
DL/VA: 2.69 ml/min/mmHg/L
DLCO cor % pred: 54 %
DLCO cor: 9.02 ml/min/mmHg
DLCO unc % pred: 54 %
DLCO unc: 9.02 ml/min/mmHg
FEF 25-75 Post: 0.51 L/s
FEF 25-75 Pre: 0.47 L/s
FEF2575-%Change-Post: 7 %
FEF2575-%Pred-Post: 38 %
FEF2575-%Pred-Pre: 35 %
FEV1-%Change-Post: 9 %
FEV1-%Pred-Post: 67 %
FEV1-%Pred-Pre: 61 %
FEV1-Post: 1.13 L
FEV1-Pre: 1.03 L
FEV1FVC-%Change-Post: 10 %
FEV1FVC-%Pred-Pre: 73 %
FEV6-%Change-Post: 0 %
FEV6-%Pred-Post: 87 %
FEV6-%Pred-Pre: 88 %
FEV6-Post: 1.86 L
FEV6-Pre: 1.88 L
FEV6FVC-%Change-Post: 0 %
FEV6FVC-%Pred-Post: 105 %
FEV6FVC-%Pred-Pre: 105 %
FVC-%Change-Post: 0 %
FVC-%Pred-Post: 82 %
FVC-%Pred-Pre: 83 %
FVC-Post: 1.86 L
FVC-Pre: 1.88 L
Post FEV1/FVC ratio: 61 %
Post FEV6/FVC ratio: 100 %
Pre FEV1/FVC ratio: 55 %
Pre FEV6/FVC Ratio: 100 %
RV % pred: 111 %
RV: 2.37 L
TLC % pred: 91 %
TLC: 4.07 L

## 2023-07-05 NOTE — Patient Instructions (Signed)
Full PFT performed today. °

## 2023-07-05 NOTE — Progress Notes (Signed)
Full PFT performed today. °

## 2023-07-07 ENCOUNTER — Other Ambulatory Visit: Payer: Self-pay | Admitting: *Deleted

## 2023-07-10 DIAGNOSIS — I10 Essential (primary) hypertension: Secondary | ICD-10-CM | POA: Diagnosis not present

## 2023-07-10 DIAGNOSIS — F331 Major depressive disorder, recurrent, moderate: Secondary | ICD-10-CM | POA: Diagnosis not present

## 2023-07-10 DIAGNOSIS — F411 Generalized anxiety disorder: Secondary | ICD-10-CM | POA: Diagnosis not present

## 2023-07-10 DIAGNOSIS — G4700 Insomnia, unspecified: Secondary | ICD-10-CM | POA: Diagnosis not present

## 2023-07-10 DIAGNOSIS — R64 Cachexia: Secondary | ICD-10-CM | POA: Diagnosis not present

## 2023-07-10 DIAGNOSIS — J449 Chronic obstructive pulmonary disease, unspecified: Secondary | ICD-10-CM | POA: Diagnosis not present

## 2023-07-11 DIAGNOSIS — Z8781 Personal history of (healed) traumatic fracture: Secondary | ICD-10-CM | POA: Diagnosis not present

## 2023-07-11 DIAGNOSIS — Z801 Family history of malignant neoplasm of trachea, bronchus and lung: Secondary | ICD-10-CM | POA: Diagnosis not present

## 2023-07-11 DIAGNOSIS — R634 Abnormal weight loss: Secondary | ICD-10-CM | POA: Diagnosis not present

## 2023-07-11 DIAGNOSIS — Z720 Tobacco use: Secondary | ICD-10-CM | POA: Diagnosis not present

## 2023-07-11 DIAGNOSIS — R636 Underweight: Secondary | ICD-10-CM | POA: Diagnosis not present

## 2023-07-11 DIAGNOSIS — R946 Abnormal results of thyroid function studies: Secondary | ICD-10-CM | POA: Diagnosis not present

## 2023-07-11 DIAGNOSIS — R251 Tremor, unspecified: Secondary | ICD-10-CM | POA: Diagnosis not present

## 2023-07-11 DIAGNOSIS — E063 Autoimmune thyroiditis: Secondary | ICD-10-CM | POA: Diagnosis not present

## 2023-07-12 ENCOUNTER — Encounter: Payer: Self-pay | Admitting: *Deleted

## 2023-07-12 ENCOUNTER — Other Ambulatory Visit: Payer: Self-pay | Admitting: *Deleted

## 2023-07-12 ENCOUNTER — Other Ambulatory Visit: Payer: Self-pay | Admitting: Internal Medicine

## 2023-07-12 DIAGNOSIS — Z8781 Personal history of (healed) traumatic fracture: Secondary | ICD-10-CM

## 2023-07-12 NOTE — Patient Outreach (Signed)
Care Management   Visit Note  07/12/2023 Name: Crystal Noble MRN: [id number] DOB: 03-05-1946  Subjective: Crystal Noble is a 78 y.o. year old female who is a primary care patient of Lewis Moccasin, MD. The Care Management team was consulted for assistance.      Engaged with patient spoke with patient by telephone.    Goals Addressed             This Visit's Progress    COMPLETED: RNCM Care Management Expected Outcomes: Monitor, Self-Manage and Reduce Symptoms of: COPD, HTN, CAD       Current Barriers:  Chronic Disease Management support and education needs related to CAD, HTN, and COPD   RNCM Clinical Goal(s):  Patient will verbalize basic understanding of  CAD, HTN, and COPD disease process and self health management plan as evidenced by verbal explanation, recognizing symptoms, lifestyle modifications attend all scheduled medical appointments: with primary care provider and specialist as evidenced by keeping all scheduled appointments demonstrate Improved and Ongoing adherence to prescribed treatment plan for CAD, HTN, and COPD as evidenced by consistent medication compliance, symptom monitoring, continued lifestyle changes continue to work with RN Care Manager to address care management and care coordination needs related to  CAD, HTN, and COPD as evidenced by adherence to CM Team Scheduled appointments through collaboration with RN Care manager, provider, and care team.   Interventions: Evaluation of current treatment plan related to  self management and patient's adherence to plan as established by provider   CAD Interventions: (Status:  Patient declined further engagement on this goal.) Long Term Goal Assessed understanding of CAD diagnosis Medications reviewed including medications utilized in CAD treatment plan Provided education on importance of blood pressure control in management of CAD Provided education on Importance of limiting foods high in  cholesterol Counseled on importance of regular laboratory monitoring as prescribed Counseled on the importance of exercise goals with target of 150 minutes per week Reviewed Importance of taking all medications as prescribed Reviewed Importance of attending all scheduled provider appointments Advised to report any changes in symptoms or exercise tolerance Advised patient to discuss acute changes with provider Screening for signs and symptoms of depression related to chronic disease state Assessed social determinant of health barriers   COPD Interventions:  (Status:  Goal on track:  NO.) Long Term Goal Provided patient with basic written and verbal COPD education on self care/management/and exacerbation prevention. Patient noted to be short of breath during call and reports nothing has changed since she was last see in the provider office. RNCM advised that if she was having difficulty breathing to report to the ER. Patient declined. Advised patient to track and manage COPD triggers Provided written and verbal instructions on pursed lip breathing and utilized returned demonstration as teach back Provided instruction about proper use of medications used for management of COPD including inhalers. Reports compliance with medications Advised patient to self assesses COPD action plan zone and make appointment with provider if in the yellow zone for 48 hours without improvement Advised patient to engage in light exercise as tolerated 3-5 days a week to aid in the the management of COPD Provided education about and advised patient to utilize infection prevention strategies to reduce risk of respiratory infection Discussed the importance of adequate rest and management of fatigue with COPD Screening for signs and symptoms of depression related to chronic disease state  Assessed social determinant of health barriers   Hypertension Interventions:  (Status:  Goal on track:  Yes.) Long Term Goal Last  practice recorded BP readings:  BP Readings from Last 3 Encounters:  06/09/23 118/60  05/24/23 123/76  02/20/23 130/80   Most recent eGFR/CrCl: No results found for: "EGFR"  No components found for: "CRCL"  Evaluation of current treatment plan related to hypertension self management and patient's adherence to plan as established by provider. Reports not checking BP at home and states she does have a machine. She states her BP is stable Provided education to patient re: stroke prevention, s/s of heart attack and stroke Reviewed medications with patient and discussed importance of compliance. Reports compliance with all medications Counseled on adverse effects of illicit drug and excessive alcohol use in patients with high blood pressure  Counseled on the importance of exercise goals with target of 150 minutes per week Discussed plans with patient for ongoing care management follow up and provided patient with direct contact information for care management team Advised patient, providing education and rationale, to monitor blood pressure daily and record, calling PCP for findings outside established parameters Reviewed scheduled/upcoming provider appointments including:  Advised patient to discuss new changes with blood pressure or elevated readings with provider Provided education on prescribed diet Heart Healthy Discussed complications of poorly controlled blood pressure such as heart disease, stroke, circulatory complications, vision complications, kidney impairment, sexual dysfunction Screening for signs and symptoms of depression related to chronic disease state  Assessed social determinant of health barriers  Patient Goals/Self-Care Activities: Take all medications as prescribed Attend all scheduled provider appointments Call pharmacy for medication refills 3-7 days in advance of running out of medications Attend church or other social activities Perform all self care activities  independently  Perform IADL's (shopping, preparing meals, housekeeping, managing finances) independently Call provider office for new concerns or questions  eliminate smoking in my home follow rescue plan if symptoms flare-up check blood pressure daily write blood pressure results in a log or diary take blood pressure log to all doctor appointments  Follow Up Plan:  No further follow up required: Patient declines further follow up calls           Consent to Services:  Patient declines to continue with scheduled outreaches  Plan: No further follow up required: Patient declines further follow up calls  Larey Brick, BSN RN [facility]  [facility], [facility] RN Care Manager Direct Dial: [phone]  Fax: [phone]

## 2023-07-12 NOTE — Patient Instructions (Signed)
Visit Information  Thank you for taking time to visit with me today. Please don't hesitate to contact me if I can be of assistance to you before our next scheduled telephone appointment.  Following are the goals we discussed today:   Goals Addressed             This Visit's Progress    COMPLETED: RNCM Care Management Expected Outcomes: Monitor, Self-Manage and Reduce Symptoms of: COPD, HTN, CAD       Current Barriers:  Chronic Disease Management support and education needs related to CAD, HTN, and COPD   RNCM Clinical Goal(s):  Patient will verbalize basic understanding of  CAD, HTN, and COPD disease process and self health management plan as evidenced by verbal explanation, recognizing symptoms, lifestyle modifications attend all scheduled medical appointments: with primary care provider and specialist as evidenced by keeping all scheduled appointments demonstrate Improved and Ongoing adherence to prescribed treatment plan for CAD, HTN, and COPD as evidenced by consistent medication compliance, symptom monitoring, continued lifestyle changes continue to work with RN Care Manager to address care management and care coordination needs related to  CAD, HTN, and COPD as evidenced by adherence to CM Team Scheduled appointments through collaboration with RN Care manager, provider, and care team.   Interventions: Evaluation of current treatment plan related to  self management and patient's adherence to plan as established by provider   CAD Interventions: (Status:  Patient declined further engagement on this goal.) Long Term Goal Assessed understanding of CAD diagnosis Medications reviewed including medications utilized in CAD treatment plan Provided education on importance of blood pressure control in management of CAD Provided education on Importance of limiting foods high in cholesterol Counseled on importance of regular laboratory monitoring as prescribed Counseled on the importance of  exercise goals with target of 150 minutes per week Reviewed Importance of taking all medications as prescribed Reviewed Importance of attending all scheduled provider appointments Advised to report any changes in symptoms or exercise tolerance Advised patient to discuss acute changes with provider Screening for signs and symptoms of depression related to chronic disease state Assessed social determinant of health barriers   COPD Interventions:  (Status:  Goal on track:  NO.) Long Term Goal Provided patient with basic written and verbal COPD education on self care/management/and exacerbation prevention. Patient noted to be short of breath during call and reports nothing has changed since she was last see in the provider office. RNCM advised that if she was having difficulty breathing to report to the ER. Patient declined. Advised patient to track and manage COPD triggers Provided written and verbal instructions on pursed lip breathing and utilized returned demonstration as teach back Provided instruction about proper use of medications used for management of COPD including inhalers. Reports compliance with medications Advised patient to self assesses COPD action plan zone and make appointment with provider if in the yellow zone for 48 hours without improvement Advised patient to engage in light exercise as tolerated 3-5 days a week to aid in the the management of COPD Provided education about and advised patient to utilize infection prevention strategies to reduce risk of respiratory infection Discussed the importance of adequate rest and management of fatigue with COPD Screening for signs and symptoms of depression related to chronic disease state  Assessed social determinant of health barriers   Hypertension Interventions:  (Status:  Goal on track:  Yes.) Long Term Goal Last practice recorded BP readings:  BP Readings from Last 3 Encounters:  06/09/23 118/60  05/24/23 123/76  02/20/23  130/80   Most recent eGFR/CrCl: No results found for: "EGFR"  No components found for: "CRCL"  Evaluation of current treatment plan related to hypertension self management and patient's adherence to plan as established by provider. Reports not checking BP at home and states she does have a machine. She states her BP is stable Provided education to patient re: stroke prevention, s/s of heart attack and stroke Reviewed medications with patient and discussed importance of compliance. Reports compliance with all medications Counseled on adverse effects of illicit drug and excessive alcohol use in patients with high blood pressure  Counseled on the importance of exercise goals with target of 150 minutes per week Discussed plans with patient for ongoing care management follow up and provided patient with direct contact information for care management team Advised patient, providing education and rationale, to monitor blood pressure daily and record, calling PCP for findings outside established parameters Reviewed scheduled/upcoming provider appointments including:  Advised patient to discuss new changes with blood pressure or elevated readings with provider Provided education on prescribed diet Heart Healthy Discussed complications of poorly controlled blood pressure such as heart disease, stroke, circulatory complications, vision complications, kidney impairment, sexual dysfunction Screening for signs and symptoms of depression related to chronic disease state  Assessed social determinant of health barriers  Patient Goals/Self-Care Activities: Take all medications as prescribed Attend all scheduled provider appointments Call pharmacy for medication refills 3-7 days in advance of running out of medications Attend church or other social activities Perform all self care activities independently  Perform IADL's (shopping, preparing meals, housekeeping, managing finances) independently Call provider  office for new concerns or questions  eliminate smoking in my home follow rescue plan if symptoms flare-up check blood pressure daily write blood pressure results in a log or diary take blood pressure log to all doctor appointments  Follow Up Plan:  No further follow up required: Patient declines further follow up calls             Please call the care guide team at [phone] if you need to cancel or reschedule your appointment.   If you are experiencing a Mental Health or Behavioral Health Crisis or need someone to talk to, please call the Suicide and Crisis Lifeline: 988 call the [place] National Suicide Prevention Lifeline: (559)437-3746 or TTY: 458-496-6307 TTY 772-779-0425) to talk to a trained counselor call 1-800-273-TALK (toll free, 24 hour hotline) go to [facility] [address] [place] [phone])   The patient verbalized understanding of instructions, educational materials, and care plan provided today and DECLINED offer to receive copy of patient instructions, educational materials, and care plan.   No further follow up required: Patient declined  Larey Brick, BSN RN [facility], [facility] RN Care Manager Direct Dial: [phone]  Fax: [phone]

## 2023-07-18 ENCOUNTER — Ambulatory Visit: Payer: Medicare HMO | Admitting: Pulmonary Disease

## 2023-07-18 ENCOUNTER — Encounter: Payer: Self-pay | Admitting: Pulmonary Disease

## 2023-07-25 DIAGNOSIS — R0981 Nasal congestion: Secondary | ICD-10-CM | POA: Diagnosis not present

## 2023-07-25 DIAGNOSIS — Z8781 Personal history of (healed) traumatic fracture: Secondary | ICD-10-CM | POA: Diagnosis not present

## 2023-07-25 DIAGNOSIS — E063 Autoimmune thyroiditis: Secondary | ICD-10-CM | POA: Diagnosis not present

## 2023-07-25 DIAGNOSIS — Z8639 Personal history of other endocrine, nutritional and metabolic disease: Secondary | ICD-10-CM | POA: Diagnosis not present

## 2023-07-25 DIAGNOSIS — Z720 Tobacco use: Secondary | ICD-10-CM | POA: Diagnosis not present

## 2023-07-25 DIAGNOSIS — Z801 Family history of malignant neoplasm of trachea, bronchus and lung: Secondary | ICD-10-CM | POA: Diagnosis not present

## 2023-07-25 DIAGNOSIS — R636 Underweight: Secondary | ICD-10-CM | POA: Diagnosis not present

## 2023-07-25 DIAGNOSIS — R634 Abnormal weight loss: Secondary | ICD-10-CM | POA: Diagnosis not present

## 2023-07-25 DIAGNOSIS — N1832 Chronic kidney disease, stage 3b: Secondary | ICD-10-CM | POA: Diagnosis not present

## 2023-08-01 DIAGNOSIS — R634 Abnormal weight loss: Secondary | ICD-10-CM | POA: Diagnosis not present

## 2023-08-01 DIAGNOSIS — R636 Underweight: Secondary | ICD-10-CM | POA: Diagnosis not present

## 2023-08-01 DIAGNOSIS — R251 Tremor, unspecified: Secondary | ICD-10-CM | POA: Diagnosis not present

## 2023-08-01 DIAGNOSIS — E063 Autoimmune thyroiditis: Secondary | ICD-10-CM | POA: Diagnosis not present

## 2023-08-02 DIAGNOSIS — I10 Essential (primary) hypertension: Secondary | ICD-10-CM | POA: Diagnosis not present

## 2023-08-02 DIAGNOSIS — R5383 Other fatigue: Secondary | ICD-10-CM | POA: Diagnosis not present

## 2023-08-03 ENCOUNTER — Ambulatory Visit: Payer: Medicare HMO | Admitting: Pulmonary Disease

## 2023-08-07 DIAGNOSIS — R7989 Other specified abnormal findings of blood chemistry: Secondary | ICD-10-CM | POA: Diagnosis not present

## 2023-08-07 DIAGNOSIS — F17200 Nicotine dependence, unspecified, uncomplicated: Secondary | ICD-10-CM | POA: Diagnosis not present

## 2023-08-07 DIAGNOSIS — R64 Cachexia: Secondary | ICD-10-CM | POA: Diagnosis not present

## 2023-08-07 DIAGNOSIS — E063 Autoimmune thyroiditis: Secondary | ICD-10-CM | POA: Diagnosis not present

## 2023-08-07 DIAGNOSIS — I10 Essential (primary) hypertension: Secondary | ICD-10-CM | POA: Diagnosis not present

## 2023-08-07 DIAGNOSIS — J441 Chronic obstructive pulmonary disease with (acute) exacerbation: Secondary | ICD-10-CM | POA: Diagnosis not present

## 2023-08-07 DIAGNOSIS — J449 Chronic obstructive pulmonary disease, unspecified: Secondary | ICD-10-CM | POA: Diagnosis not present

## 2023-08-09 ENCOUNTER — Ambulatory Visit: Payer: Medicare HMO | Admitting: Cardiology

## 2023-08-23 DIAGNOSIS — Z720 Tobacco use: Secondary | ICD-10-CM | POA: Diagnosis not present

## 2023-08-23 DIAGNOSIS — R634 Abnormal weight loss: Secondary | ICD-10-CM | POA: Diagnosis not present

## 2023-08-23 DIAGNOSIS — R636 Underweight: Secondary | ICD-10-CM | POA: Diagnosis not present

## 2023-08-23 DIAGNOSIS — E063 Autoimmune thyroiditis: Secondary | ICD-10-CM | POA: Diagnosis not present

## 2023-08-23 DIAGNOSIS — Z8781 Personal history of (healed) traumatic fracture: Secondary | ICD-10-CM | POA: Diagnosis not present

## 2023-08-23 DIAGNOSIS — Z801 Family history of malignant neoplasm of trachea, bronchus and lung: Secondary | ICD-10-CM | POA: Diagnosis not present

## 2023-08-23 DIAGNOSIS — Z8639 Personal history of other endocrine, nutritional and metabolic disease: Secondary | ICD-10-CM | POA: Diagnosis not present

## 2023-08-23 DIAGNOSIS — N1832 Chronic kidney disease, stage 3b: Secondary | ICD-10-CM | POA: Diagnosis not present

## 2023-08-23 DIAGNOSIS — R0981 Nasal congestion: Secondary | ICD-10-CM | POA: Diagnosis not present

## 2023-09-14 DIAGNOSIS — Z13220 Encounter for screening for lipoid disorders: Secondary | ICD-10-CM | POA: Diagnosis not present

## 2023-09-14 DIAGNOSIS — Z0000 Encounter for general adult medical examination without abnormal findings: Secondary | ICD-10-CM | POA: Diagnosis not present

## 2023-09-19 ENCOUNTER — Ambulatory Visit (INDEPENDENT_AMBULATORY_CARE_PROVIDER_SITE_OTHER): Payer: Medicare HMO | Admitting: Pulmonary Disease

## 2023-09-19 ENCOUNTER — Encounter: Payer: Self-pay | Admitting: Pulmonary Disease

## 2023-09-19 DIAGNOSIS — J432 Centrilobular emphysema: Secondary | ICD-10-CM | POA: Diagnosis not present

## 2023-09-19 MED ORDER — ALBUTEROL SULFATE HFA 108 (90 BASE) MCG/ACT IN AERS
2.0000 | INHALATION_SPRAY | RESPIRATORY_TRACT | 6 refills | Status: AC | PRN
Start: 2023-09-19 — End: ?

## 2023-09-19 MED ORDER — IPRATROPIUM-ALBUTEROL 0.5-2.5 (3) MG/3ML IN SOLN
3.0000 mL | Freq: Three times a day (TID) | RESPIRATORY_TRACT | 11 refills | Status: AC
Start: 2023-09-19 — End: ?

## 2023-09-19 MED ORDER — BUDESONIDE 0.5 MG/2ML IN SUSP
0.5000 mg | Freq: Two times a day (BID) | RESPIRATORY_TRACT | 12 refills | Status: AC
Start: 2023-09-19 — End: ?

## 2023-09-19 NOTE — Patient Instructions (Addendum)
 Continue duoneb nebulizer treatments 3 times per day  Use budesonide nebulizer treatment morning and evening (twice a day)  Use albuterol inhaler 1-2 puffs every 4-6 hours as needed  Recommend quitting smoking with nicotine replacement therapy using 7mg  nicotine patches and 2mg  mini nicotine lozenges  Call 1-800-quit-NOW to get free nicotine replacement and counseling from the state of [place]    Follow up in 6 months, call sooner if needed

## 2023-09-19 NOTE — Progress Notes (Unsigned)
 Synopsis: Referred in December 2024 for COPD by Maryelizabeth Rowan, MD  Subjective:   PATIENT ID: Crystal Noble GENDER: female DOB: [birth year], MRN: [id number]  HPI  Chief Complaint  Patient presents with   Follow-up    Pt states SOB still   Crystal Noble is a 78 year old woman, daily smoker with history of hypertension and stroke who is referred to pulmonary clinic for evaluation of COPD.   The patient, a long-term smoker, presents with a couple months of progressive shortness of breath and wheezing. They deny any prior respiratory issues or symptoms in their younger years. They report no associated cough. They have reduced their smoking from a pack a day to half a pack a day over the years. They deny any nocturnal symptoms or sleep disturbances due to their respiratory symptoms. They have been using a nebulizer and a Trelegy inhaler, but report no improvement in their symptoms. They have not used the nebulizer for the past two weeks. They deny any leg swelling. They express a potential interest in quitting smoking.   OV 09/19/23 She experiences persistent shortness of breath despite using nebulizer treatments. The nebulizers have not been more effective than her inhalers. No wheezing is present, but she has difficulty in breathing. Her sleep remains unaffected, and she does not wake up with shortness of breath.  She has a history of moderate obstruction from a breathing test conducted earlier this year. She is currently using Duoneb treatment once a day and budesonide nebulizer treatments in the morning and evening.  She continues to smoke about eight cigarettes a day, which likely contributes to her symptoms.  Past Medical History:  Diagnosis Date   Carotid stenosis, asymptomatic    Colitis, collagenous    Hypertension    Stroke [facility]) 06/2019     Family History  Problem Relation Age of Onset   Hypertension Mother    Hyperlipidemia Mother    Heart attack Mother     Depression Mother        Bi-Polar   Cancer Father        Lung   Lung cancer Father        asbestos/never smoked   Liver disease Neg Hx    Colon cancer Neg Hx    Esophageal cancer Neg Hx      Social History   Socioeconomic History   Marital status: Widowed    Spouse name: Not on file   Number of children: 0   Years of education: Not on file   Highest education level: Not on file  Occupational History   Occupation: retired  Tobacco Use   Smoking status: Every Day    Current packs/day: 0.50    Average packs/day: 0.5 packs/day for 32.3 years (16.1 ttl pk-yrs)    Types: Cigarettes    Start date: 06/14/1991   Smokeless tobacco: Never  Vaping Use   Vaping status: Never Used  Substance and Sexual Activity   Alcohol use: Yes    Alcohol/week: 21.0 standard drinks of alcohol    Types: 21 Glasses of wine per week    Comment: daily   Drug use: No   Sexual activity: Not on file  Other Topics Concern   Not on file  Social History Narrative   Lives alone   Right Handed   Drinks 2-4 cups caffeine dialy   Social Drivers of Health   Financial Resource Strain: Low Risk  (07/12/2023)   Overall Financial Resource Strain (CARDIA)  Difficulty of Paying Living Expenses: Not hard at all  Food Insecurity: No Food Insecurity (07/12/2023)   Hunger Vital Sign    Worried About Running Out of Food in the Last Year: Never true    Ran Out of Food in the Last Year: Never true  Transportation Needs: No Transportation Needs (07/12/2023)   PRAPARE - Administrator, Civil Service (Medical): No    Lack of Transportation (Non-Medical): No  Physical Activity: Inactive (07/12/2023)   Exercise Vital Sign    Days of Exercise per Week: 0 days    Minutes of Exercise per Session: 0 min  Stress: No Stress Concern Present (07/12/2023)   [employer] of Occupational Health - Occupational Stress Questionnaire    Feeling of Stress : Not at all  Social Connections: Socially Isolated (07/12/2023)    Social Connection and Isolation Panel [NHANES]    Frequency of Communication with Friends and Family: Never    Frequency of Social Gatherings with Friends and Family: Never    Attends Religious Services: Never    Database administrator or Organizations: No    Attends Banker Meetings: Never    Marital Status: Widowed  Intimate Partner Violence: Not At Risk (07/12/2023)   Humiliation, Afraid, Rape, and Kick questionnaire    Fear of Current or Ex-Partner: No    Emotionally Abused: No    Physically Abused: No    Sexually Abused: No     Allergies  Allergen Reactions   Other Other (See Comments)    "Exacerbates my collagenous colitis"   Penicillin G Rash     Outpatient Medications Prior to Visit  Medication Sig Dispense Refill   amLODipine (NORVASC) 5 MG tablet Take 1 tablet (5 mg total) by mouth daily. 30 tablet 0   ARIPiprazole (ABILIFY) 30 MG tablet Take 30 mg by mouth daily.     busPIRone (BUSPAR) 10 MG tablet Take 20 mg by mouth daily.     carvedilol (COREG) 25 MG tablet Take 25 mg by mouth 2 (two) times daily.     Cholecalciferol (VITAMIN D3 ULTRA STRENGTH PO) Take 1 tablet by mouth daily.     escitalopram (LEXAPRO) 20 MG tablet Take 20 mg by mouth daily.     rosuvastatin (CRESTOR) 10 MG tablet Take 10 mg by mouth at bedtime.     valsartan (DIOVAN) 320 MG tablet Take 160 mg by mouth daily.     albuterol (VENTOLIN HFA) 108 (90 Base) MCG/ACT inhaler Inhale 2 puffs into the lungs every 4 (four) hours as needed.     budesonide (PULMICORT) 0.5 MG/2ML nebulizer solution Take 2 mLs (0.5 mg total) by nebulization 2 (two) times daily. 120 mL 12   ipratropium-albuterol (DUONEB) 0.5-2.5 (3) MG/3ML SOLN Take 3 mLs by nebulization every 6 (six) hours as needed. 360 mL 11   No facility-administered medications prior to visit.    Review of Systems  Constitutional:  Negative for chills, fever, malaise/fatigue and weight loss.  HENT:  Negative for congestion, sinus pain and  sore throat.   Eyes: Negative.   Respiratory:  Positive for shortness of breath. Negative for cough, hemoptysis, sputum production and wheezing.   Cardiovascular:  Negative for chest pain, palpitations, orthopnea, claudication and leg swelling.  Gastrointestinal:  Negative for abdominal pain, heartburn, nausea and vomiting.  Genitourinary: Negative.   Musculoskeletal:  Negative for joint pain and myalgias.  Skin:  Negative for rash.  Neurological:  Negative for weakness.  Endo/Heme/Allergies: Negative.   Psychiatric/Behavioral:  Negative.     Objective:   Vitals:   09/19/23 1526  BP: (!) 187/80  Pulse: 84  SpO2: 98%  Weight: 75 lb (34 kg)  Height: 5' (1.524 m)   Physical Exam Constitutional:      General: She is not in acute distress.    Appearance: Normal appearance.  Eyes:     General: No scleral icterus.    Conjunctiva/sclera: Conjunctivae normal.  Cardiovascular:     Rate and Rhythm: Normal rate and regular rhythm.  Pulmonary:     Breath sounds: No wheezing, rhonchi or rales.  Musculoskeletal:     Right lower leg: No edema.     Left lower leg: No edema.  Skin:    General: Skin is warm and dry.  Neurological:     General: No focal deficit present.     CBC    Component Value Date/Time   WBC 7.6 12/06/2022 1429   RBC 3.35 (L) 12/06/2022 1429   HGB 10.7 (L) 12/06/2022 1429   HCT 33.1 (L) 12/06/2022 1429   PLT 259 12/06/2022 1429   MCV 98.8 12/06/2022 1429   MCH 31.9 12/06/2022 1429   MCHC 32.3 12/06/2022 1429   RDW 13.4 12/06/2022 1429   LYMPHSABS 1.1 12/06/2022 1429   MONOABS 0.5 12/06/2022 1429   EOSABS 0.1 12/06/2022 1429   BASOSABS 0.0 12/06/2022 1429      Latest Ref Rng & Units 12/06/2022    2:29 PM 10/20/2022    8:29 AM 10/19/2022    5:28 AM  BMP  Glucose 70 - 99 mg/dL 782  98  956   BUN 8 - 23 mg/dL 47  51  52   Creatinine 0.44 - 1.00 mg/dL 2.13  0.86  5.78   Sodium 135 - 145 mmol/L 139  135  135   Potassium 3.5 - 5.1 mmol/L 4.1  3.9  4.4    Chloride 98 - 111 mmol/L 108  109  109   CO2 22 - 32 mmol/L 21  20  19    Calcium 8.9 - 10.3 mg/dL 9.3  8.7  8.5    Chest imaging: CTA Chest 12/06/22 Mediastinum/Nodes: No enlarged mediastinal, hilar, or axillary lymph nodes. Thyroid gland, trachea, and esophagus demonstrate no significant findings.   Lungs/Pleura: Stable emphysema. No acute airspace disease, effusion, or pneumothorax. Central airways are patent.  PFT:    Latest Ref Rng & Units 07/05/2023   12:54 PM  PFT Results  FVC-Pre L 1.88   FVC-Predicted Pre % 83   FVC-Post L 1.86   FVC-Predicted Post % 82   Pre FEV1/FVC % % 55   Post FEV1/FCV % % 61   FEV1-Pre L 1.03   FEV1-Predicted Pre % 61   FEV1-Post L 1.13   DLCO uncorrected ml/min/mmHg 9.02   DLCO UNC% % 54   DLCO corrected ml/min/mmHg 9.02   DLCO COR %Predicted % 54   DLVA Predicted % 63   TLC L 4.07   TLC % Predicted % 91   RV % Predicted % 111     Labs:  Path:  Echo:  Heart Catheterization:    Assessment & Plan:   Centrilobular emphysema (HCC) - Plan: albuterol (VENTOLIN HFA) 108 (90 Base) MCG/ACT inhaler, budesonide (PULMICORT) 0.5 MG/2ML nebulizer solution, ipratropium-albuterol (DUONEB) 0.5-2.5 (3) MG/3ML SOLN  Discussion: Crystal Noble is a 78 year old woman, daily smoker with history of hypertension and stroke who returns to pulmonary clinic for evaluation of COPD.   Chronic Obstructive Pulmonary Disease (COPD)  Moderate obstruction with persistent dyspnea. Smoking exacerbates symptoms. Emphasized smoking cessation to prevent further respiratory decline. Discussed increasing Duoneb frequency. - Use Duoneb treatments three times daily. - Continue budesonide nebulizer treatments twice daily. - Refill all nebulizer prescriptions.  Tobacco Use Disorder Continues smoking eight cigarettes daily. Discussed benefits of cessation and advised on nicotine replacement therapy for 3 minutes. Recommended structured plan for gradual reduction and  cessation. - Use 7 mg nicotine patch daily. - Use nicotine lozenges as needed for breakthrough cravings. - Encourage reduction of cigarette use with a goal to quit completely after two weeks.  Follow up in 6 months  Melody Comas, MD [facility] Office: [phone]    Current Outpatient Medications:    amLODipine (NORVASC) 5 MG tablet, Take 1 tablet (5 mg total) by mouth daily., Disp: 30 tablet, Rfl: 0   ARIPiprazole (ABILIFY) 30 MG tablet, Take 30 mg by mouth daily., Disp: , Rfl:    busPIRone (BUSPAR) 10 MG tablet, Take 20 mg by mouth daily., Disp: , Rfl:    carvedilol (COREG) 25 MG tablet, Take 25 mg by mouth 2 (two) times daily., Disp: , Rfl:    Cholecalciferol (VITAMIN D3 ULTRA STRENGTH PO), Take 1 tablet by mouth daily., Disp: , Rfl:    escitalopram (LEXAPRO) 20 MG tablet, Take 20 mg by mouth daily., Disp: , Rfl:    rosuvastatin (CRESTOR) 10 MG tablet, Take 10 mg by mouth at bedtime., Disp: , Rfl:    valsartan (DIOVAN) 320 MG tablet, Take 160 mg by mouth daily., Disp: , Rfl:    albuterol (VENTOLIN HFA) 108 (90 Base) MCG/ACT inhaler, Inhale 2 puffs into the lungs every 4 (four) hours as needed., Disp: 8 g, Rfl: 6   budesonide (PULMICORT) 0.5 MG/2ML nebulizer solution, Take 2 mLs (0.5 mg total) by nebulization 2 (two) times daily., Disp: 120 mL, Rfl: 12   ipratropium-albuterol (DUONEB) 0.5-2.5 (3) MG/3ML SOLN, Take 3 mLs by nebulization every 8 (eight) hours., Disp: 360 mL, Rfl: 11

## 2023-09-20 ENCOUNTER — Encounter: Payer: Self-pay | Admitting: Pulmonary Disease

## 2023-09-20 DIAGNOSIS — J449 Chronic obstructive pulmonary disease, unspecified: Secondary | ICD-10-CM | POA: Diagnosis not present

## 2023-09-20 DIAGNOSIS — R64 Cachexia: Secondary | ICD-10-CM | POA: Diagnosis not present

## 2023-09-20 DIAGNOSIS — F17200 Nicotine dependence, unspecified, uncomplicated: Secondary | ICD-10-CM | POA: Diagnosis not present

## 2023-09-20 DIAGNOSIS — F411 Generalized anxiety disorder: Secondary | ICD-10-CM | POA: Diagnosis not present

## 2023-09-20 DIAGNOSIS — I10 Essential (primary) hypertension: Secondary | ICD-10-CM | POA: Diagnosis not present

## 2023-09-20 DIAGNOSIS — F1020 Alcohol dependence, uncomplicated: Secondary | ICD-10-CM | POA: Diagnosis not present

## 2023-09-22 DIAGNOSIS — R64 Cachexia: Secondary | ICD-10-CM | POA: Diagnosis not present

## 2023-09-22 DIAGNOSIS — Z0000 Encounter for general adult medical examination without abnormal findings: Secondary | ICD-10-CM | POA: Diagnosis not present

## 2023-09-22 DIAGNOSIS — F17200 Nicotine dependence, unspecified, uncomplicated: Secondary | ICD-10-CM | POA: Diagnosis not present

## 2023-09-22 DIAGNOSIS — R7989 Other specified abnormal findings of blood chemistry: Secondary | ICD-10-CM | POA: Diagnosis not present

## 2023-10-04 DIAGNOSIS — J441 Chronic obstructive pulmonary disease with (acute) exacerbation: Secondary | ICD-10-CM | POA: Diagnosis not present

## 2023-10-04 DIAGNOSIS — I10 Essential (primary) hypertension: Secondary | ICD-10-CM | POA: Diagnosis not present

## 2023-10-04 DIAGNOSIS — F17200 Nicotine dependence, unspecified, uncomplicated: Secondary | ICD-10-CM | POA: Diagnosis not present

## 2023-10-04 DIAGNOSIS — R64 Cachexia: Secondary | ICD-10-CM | POA: Diagnosis not present

## 2023-10-20 DIAGNOSIS — F411 Generalized anxiety disorder: Secondary | ICD-10-CM | POA: Diagnosis not present

## 2023-10-20 DIAGNOSIS — F331 Major depressive disorder, recurrent, moderate: Secondary | ICD-10-CM | POA: Diagnosis not present

## 2023-10-20 DIAGNOSIS — F1020 Alcohol dependence, uncomplicated: Secondary | ICD-10-CM | POA: Diagnosis not present

## 2023-10-20 DIAGNOSIS — R931 Abnormal findings on diagnostic imaging of heart and coronary circulation: Secondary | ICD-10-CM | POA: Diagnosis not present

## 2023-10-20 DIAGNOSIS — J449 Chronic obstructive pulmonary disease, unspecified: Secondary | ICD-10-CM | POA: Diagnosis not present

## 2023-10-20 DIAGNOSIS — G4700 Insomnia, unspecified: Secondary | ICD-10-CM | POA: Diagnosis not present

## 2023-10-20 DIAGNOSIS — R64 Cachexia: Secondary | ICD-10-CM | POA: Diagnosis not present

## 2023-10-20 DIAGNOSIS — Z79899 Other long term (current) drug therapy: Secondary | ICD-10-CM | POA: Diagnosis not present

## 2023-10-24 ENCOUNTER — Telehealth: Payer: Self-pay

## 2023-10-24 DIAGNOSIS — J449 Chronic obstructive pulmonary disease, unspecified: Secondary | ICD-10-CM | POA: Diagnosis not present

## 2023-10-24 DIAGNOSIS — I639 Cerebral infarction, unspecified: Secondary | ICD-10-CM | POA: Diagnosis not present

## 2023-10-24 DIAGNOSIS — I10 Essential (primary) hypertension: Secondary | ICD-10-CM | POA: Diagnosis not present

## 2023-10-24 NOTE — Progress Notes (Signed)
 Complex Care Management Note Care Guide Note  10/24/2023 Name: Crystal Noble MRN: [id number] DOB: [birth year]   Complex Care Management Outreach Attempts: An unsuccessful telephone outreach was attempted today to offer the patient information about available complex care management services.  Follow Up Plan:  Additional outreach attempts will be made to offer the patient complex care management information and services.   Encounter Outcome:  No Answer  Bettymae Yott [facility]  [facility] Guide Direct Dial: [phone]  Fax: [phone] Website: [email or web address]

## 2023-10-27 ENCOUNTER — Telehealth: Payer: Self-pay

## 2023-10-27 NOTE — Progress Notes (Signed)
 Complex Care Management Note Care Guide Note  10/27/2023 Name: Crystal Noble MRN: [id number] DOB: Aug 04, 1945  Crystal Noble is a 78 y.o. year old female who is a primary care patient of Avon Boers, [place] Breen, MD . The community resource team was consulted for assistance with Food Insecurity  SDOH screenings and interventions completed:  Yes  Social Drivers of Health From This Encounter   Food Insecurity: Patient Declined (10/27/2023)   Hunger Vital Sign    Worried About Running Out of Food in the Last Year: Patient declined    Ran Out of Food in the Last Year: Patient declined  Housing: Patient Declined (10/27/2023)   Housing Stability Vital Sign    Unable to Pay for Housing in the Last Year: Patient declined    Number of Times Moved in the Last Year: 0    Homeless in the Last Year: Patient declined  Financial Resource Strain: Patient Declined (10/27/2023)   Overall Financial Resource Strain (CARDIA)    Difficulty of Paying Living Expenses: Patient declined  Utilities: Patient Declined (10/27/2023)   Utilities    Threatened with loss of utilities: Patient declined    SDOH Interventions Today    Flowsheet Row Most Recent Value  SDOH Interventions   Food Insecurity Interventions Patient Declined, Other (Comment)  [Patient stated that she is not food insecure and does not need assistance.]  Housing Interventions Patient Declined  Utilities Interventions Patient Declined  Financial Strain Interventions Patient Declined        Care guide performed the following interventions: Patient provided with information about care guide support team and interviewed to confirm resource needs.  Follow Up Plan:  No further follow up planned at this time. The patient has been provided with needed resources.  Encounter Outcome:  Patient Visit Completed  Nereyda Bowler [facility]  [facility] Guide Direct Dial: [phone]  Fax: [phone] Website:  [email or web address]

## 2023-11-02 DIAGNOSIS — F17200 Nicotine dependence, unspecified, uncomplicated: Secondary | ICD-10-CM | POA: Diagnosis not present

## 2023-11-02 DIAGNOSIS — R64 Cachexia: Secondary | ICD-10-CM | POA: Diagnosis not present

## 2023-11-02 DIAGNOSIS — J449 Chronic obstructive pulmonary disease, unspecified: Secondary | ICD-10-CM | POA: Diagnosis not present

## 2023-11-02 DIAGNOSIS — I639 Cerebral infarction, unspecified: Secondary | ICD-10-CM | POA: Diagnosis not present

## 2023-11-02 DIAGNOSIS — F1020 Alcohol dependence, uncomplicated: Secondary | ICD-10-CM | POA: Diagnosis not present

## 2023-11-02 DIAGNOSIS — E46 Unspecified protein-calorie malnutrition: Secondary | ICD-10-CM | POA: Diagnosis not present

## 2023-11-23 DIAGNOSIS — R64 Cachexia: Secondary | ICD-10-CM | POA: Diagnosis not present

## 2023-11-23 DIAGNOSIS — J309 Allergic rhinitis, unspecified: Secondary | ICD-10-CM | POA: Diagnosis not present

## 2023-11-23 DIAGNOSIS — I10 Essential (primary) hypertension: Secondary | ICD-10-CM | POA: Diagnosis not present

## 2023-11-23 DIAGNOSIS — E063 Autoimmune thyroiditis: Secondary | ICD-10-CM | POA: Diagnosis not present

## 2023-11-23 DIAGNOSIS — F1020 Alcohol dependence, uncomplicated: Secondary | ICD-10-CM | POA: Diagnosis not present

## 2023-11-23 DIAGNOSIS — J449 Chronic obstructive pulmonary disease, unspecified: Secondary | ICD-10-CM | POA: Diagnosis not present

## 2023-12-14 DIAGNOSIS — I10 Essential (primary) hypertension: Secondary | ICD-10-CM | POA: Diagnosis not present

## 2023-12-14 DIAGNOSIS — R64 Cachexia: Secondary | ICD-10-CM | POA: Diagnosis not present

## 2023-12-14 DIAGNOSIS — J309 Allergic rhinitis, unspecified: Secondary | ICD-10-CM | POA: Diagnosis not present

## 2023-12-14 DIAGNOSIS — J441 Chronic obstructive pulmonary disease with (acute) exacerbation: Secondary | ICD-10-CM | POA: Diagnosis not present

## 2023-12-19 DIAGNOSIS — I10 Essential (primary) hypertension: Secondary | ICD-10-CM | POA: Diagnosis not present

## 2023-12-19 DIAGNOSIS — R64 Cachexia: Secondary | ICD-10-CM | POA: Diagnosis not present

## 2023-12-19 DIAGNOSIS — J449 Chronic obstructive pulmonary disease, unspecified: Secondary | ICD-10-CM | POA: Diagnosis not present

## 2023-12-22 DIAGNOSIS — J441 Chronic obstructive pulmonary disease with (acute) exacerbation: Secondary | ICD-10-CM | POA: Diagnosis not present

## 2023-12-22 DIAGNOSIS — J449 Chronic obstructive pulmonary disease, unspecified: Secondary | ICD-10-CM | POA: Diagnosis not present

## 2023-12-22 DIAGNOSIS — R64 Cachexia: Secondary | ICD-10-CM | POA: Diagnosis not present

## 2023-12-22 DIAGNOSIS — I639 Cerebral infarction, unspecified: Secondary | ICD-10-CM | POA: Diagnosis not present

## 2023-12-22 DIAGNOSIS — F1020 Alcohol dependence, uncomplicated: Secondary | ICD-10-CM | POA: Diagnosis not present

## 2023-12-26 DIAGNOSIS — J449 Chronic obstructive pulmonary disease, unspecified: Secondary | ICD-10-CM | POA: Diagnosis not present

## 2023-12-26 DIAGNOSIS — Z515 Encounter for palliative care: Secondary | ICD-10-CM | POA: Diagnosis not present

## 2023-12-26 DIAGNOSIS — F17200 Nicotine dependence, unspecified, uncomplicated: Secondary | ICD-10-CM | POA: Diagnosis not present

## 2023-12-26 DIAGNOSIS — R64 Cachexia: Secondary | ICD-10-CM | POA: Diagnosis not present

## 2024-01-01 DIAGNOSIS — Z789 Other specified health status: Secondary | ICD-10-CM | POA: Diagnosis not present

## 2024-01-01 DIAGNOSIS — R64 Cachexia: Secondary | ICD-10-CM | POA: Diagnosis not present

## 2024-01-01 DIAGNOSIS — J449 Chronic obstructive pulmonary disease, unspecified: Secondary | ICD-10-CM | POA: Diagnosis not present

## 2024-01-01 DIAGNOSIS — Z515 Encounter for palliative care: Secondary | ICD-10-CM | POA: Diagnosis not present

## 2024-01-08 DIAGNOSIS — Z515 Encounter for palliative care: Secondary | ICD-10-CM | POA: Diagnosis not present

## 2024-01-08 DIAGNOSIS — R64 Cachexia: Secondary | ICD-10-CM | POA: Diagnosis not present

## 2024-01-08 DIAGNOSIS — J449 Chronic obstructive pulmonary disease, unspecified: Secondary | ICD-10-CM | POA: Diagnosis not present

## 2024-01-08 DIAGNOSIS — E46 Unspecified protein-calorie malnutrition: Secondary | ICD-10-CM | POA: Diagnosis not present

## 2024-01-15 DIAGNOSIS — F411 Generalized anxiety disorder: Secondary | ICD-10-CM | POA: Diagnosis not present

## 2024-01-15 DIAGNOSIS — I10 Essential (primary) hypertension: Secondary | ICD-10-CM | POA: Diagnosis not present

## 2024-01-15 DIAGNOSIS — J449 Chronic obstructive pulmonary disease, unspecified: Secondary | ICD-10-CM | POA: Diagnosis not present

## 2024-01-15 DIAGNOSIS — Z515 Encounter for palliative care: Secondary | ICD-10-CM | POA: Diagnosis not present

## 2024-01-15 DIAGNOSIS — E46 Unspecified protein-calorie malnutrition: Secondary | ICD-10-CM | POA: Diagnosis not present

## 2024-01-23 DIAGNOSIS — R64 Cachexia: Secondary | ICD-10-CM | POA: Diagnosis not present

## 2024-01-23 DIAGNOSIS — I10 Essential (primary) hypertension: Secondary | ICD-10-CM | POA: Diagnosis not present

## 2024-01-23 DIAGNOSIS — Z515 Encounter for palliative care: Secondary | ICD-10-CM | POA: Diagnosis not present

## 2024-01-23 DIAGNOSIS — J449 Chronic obstructive pulmonary disease, unspecified: Secondary | ICD-10-CM | POA: Diagnosis not present

## 2024-02-15 DIAGNOSIS — I10 Essential (primary) hypertension: Secondary | ICD-10-CM | POA: Diagnosis not present

## 2024-02-15 DIAGNOSIS — Z515 Encounter for palliative care: Secondary | ICD-10-CM | POA: Diagnosis not present

## 2024-02-15 DIAGNOSIS — E063 Autoimmune thyroiditis: Secondary | ICD-10-CM | POA: Diagnosis not present

## 2024-02-15 DIAGNOSIS — R64 Cachexia: Secondary | ICD-10-CM | POA: Diagnosis not present

## 2024-02-15 DIAGNOSIS — J449 Chronic obstructive pulmonary disease, unspecified: Secondary | ICD-10-CM | POA: Diagnosis not present

## 2024-02-15 DIAGNOSIS — F1020 Alcohol dependence, uncomplicated: Secondary | ICD-10-CM | POA: Diagnosis not present

## 2024-02-15 DIAGNOSIS — F17200 Nicotine dependence, unspecified, uncomplicated: Secondary | ICD-10-CM | POA: Diagnosis not present

## 2024-02-21 DIAGNOSIS — E063 Autoimmune thyroiditis: Secondary | ICD-10-CM | POA: Diagnosis not present

## 2024-02-21 DIAGNOSIS — Z720 Tobacco use: Secondary | ICD-10-CM | POA: Diagnosis not present

## 2024-02-21 DIAGNOSIS — R634 Abnormal weight loss: Secondary | ICD-10-CM | POA: Diagnosis not present

## 2024-02-21 DIAGNOSIS — R636 Underweight: Secondary | ICD-10-CM | POA: Diagnosis not present

## 2024-02-21 DIAGNOSIS — Z8781 Personal history of (healed) traumatic fracture: Secondary | ICD-10-CM | POA: Diagnosis not present

## 2024-02-21 DIAGNOSIS — R7303 Prediabetes: Secondary | ICD-10-CM | POA: Diagnosis not present

## 2024-02-21 DIAGNOSIS — N1832 Chronic kidney disease, stage 3b: Secondary | ICD-10-CM | POA: Diagnosis not present

## 2024-02-21 DIAGNOSIS — Z8639 Personal history of other endocrine, nutritional and metabolic disease: Secondary | ICD-10-CM | POA: Diagnosis not present

## 2024-02-22 DIAGNOSIS — Z515 Encounter for palliative care: Secondary | ICD-10-CM | POA: Diagnosis not present

## 2024-02-22 DIAGNOSIS — R64 Cachexia: Secondary | ICD-10-CM | POA: Diagnosis not present

## 2024-02-22 DIAGNOSIS — F17200 Nicotine dependence, unspecified, uncomplicated: Secondary | ICD-10-CM | POA: Diagnosis not present

## 2024-02-22 DIAGNOSIS — F1020 Alcohol dependence, uncomplicated: Secondary | ICD-10-CM | POA: Diagnosis not present

## 2024-02-22 DIAGNOSIS — J449 Chronic obstructive pulmonary disease, unspecified: Secondary | ICD-10-CM | POA: Diagnosis not present

## 2024-02-22 DIAGNOSIS — I10 Essential (primary) hypertension: Secondary | ICD-10-CM | POA: Diagnosis not present

## 2024-03-01 ENCOUNTER — Other Ambulatory Visit: Payer: Self-pay

## 2024-03-01 ENCOUNTER — Other Ambulatory Visit: Payer: Medicare HMO

## 2024-03-20 DIAGNOSIS — I10 Essential (primary) hypertension: Secondary | ICD-10-CM | POA: Diagnosis not present

## 2024-03-20 DIAGNOSIS — Z7185 Encounter for immunization safety counseling: Secondary | ICD-10-CM | POA: Diagnosis not present

## 2024-03-20 DIAGNOSIS — J449 Chronic obstructive pulmonary disease, unspecified: Secondary | ICD-10-CM | POA: Diagnosis not present

## 2024-03-20 DIAGNOSIS — F1020 Alcohol dependence, uncomplicated: Secondary | ICD-10-CM | POA: Diagnosis not present

## 2024-03-20 DIAGNOSIS — F17200 Nicotine dependence, unspecified, uncomplicated: Secondary | ICD-10-CM | POA: Diagnosis not present

## 2024-03-20 DIAGNOSIS — R634 Abnormal weight loss: Secondary | ICD-10-CM | POA: Diagnosis not present

## 2024-03-20 DIAGNOSIS — R64 Cachexia: Secondary | ICD-10-CM | POA: Diagnosis not present

## 2024-03-20 DIAGNOSIS — Z515 Encounter for palliative care: Secondary | ICD-10-CM | POA: Diagnosis not present

## 2024-03-20 DIAGNOSIS — Z23 Encounter for immunization: Secondary | ICD-10-CM | POA: Diagnosis not present

## 2024-05-04 DIAGNOSIS — I10 Essential (primary) hypertension: Secondary | ICD-10-CM | POA: Diagnosis not present

## 2024-05-04 DIAGNOSIS — G3184 Mild cognitive impairment, so stated: Secondary | ICD-10-CM | POA: Diagnosis not present

## 2024-05-04 DIAGNOSIS — F17210 Nicotine dependence, cigarettes, uncomplicated: Secondary | ICD-10-CM | POA: Diagnosis not present

## 2024-05-04 DIAGNOSIS — F329 Major depressive disorder, single episode, unspecified: Secondary | ICD-10-CM | POA: Diagnosis not present

## 2024-05-04 DIAGNOSIS — Z9181 History of falling: Secondary | ICD-10-CM | POA: Diagnosis not present

## 2024-05-04 DIAGNOSIS — M1990 Unspecified osteoarthritis, unspecified site: Secondary | ICD-10-CM | POA: Diagnosis not present

## 2024-05-04 DIAGNOSIS — F411 Generalized anxiety disorder: Secondary | ICD-10-CM | POA: Diagnosis not present

## 2024-05-04 DIAGNOSIS — I700 Atherosclerosis of aorta: Secondary | ICD-10-CM | POA: Diagnosis not present

## 2024-05-04 DIAGNOSIS — R64 Cachexia: Secondary | ICD-10-CM | POA: Diagnosis not present

## 2024-05-04 DIAGNOSIS — Z681 Body mass index (BMI) 19 or less, adult: Secondary | ICD-10-CM | POA: Diagnosis not present

## 2024-05-04 DIAGNOSIS — Z880 Allergy status to penicillin: Secondary | ICD-10-CM | POA: Diagnosis not present

## 2024-05-04 DIAGNOSIS — E785 Hyperlipidemia, unspecified: Secondary | ICD-10-CM | POA: Diagnosis not present

## 2024-05-04 DIAGNOSIS — F1020 Alcohol dependence, uncomplicated: Secondary | ICD-10-CM | POA: Diagnosis not present

## 2024-05-04 DIAGNOSIS — Z7951 Long term (current) use of inhaled steroids: Secondary | ICD-10-CM | POA: Diagnosis not present

## 2024-05-04 DIAGNOSIS — I2510 Atherosclerotic heart disease of native coronary artery without angina pectoris: Secondary | ICD-10-CM | POA: Diagnosis not present

## 2024-05-04 DIAGNOSIS — J4489 Other specified chronic obstructive pulmonary disease: Secondary | ICD-10-CM | POA: Diagnosis not present

## 2024-05-04 DIAGNOSIS — I739 Peripheral vascular disease, unspecified: Secondary | ICD-10-CM | POA: Diagnosis not present
